# Patient Record
Sex: Female | Born: 1940 | Race: White | Hispanic: No | Marital: Married | Smoking: Former smoker
Health system: Southern US, Community
[De-identification: ages and names within clinical notes are randomized; demographics above are authoritative.]

## PROBLEM LIST (undated history)

## (undated) DIAGNOSIS — I509 Heart failure, unspecified: Secondary | ICD-10-CM

## (undated) DIAGNOSIS — R0600 Dyspnea, unspecified: Secondary | ICD-10-CM

## (undated) DIAGNOSIS — J189 Pneumonia, unspecified organism: Secondary | ICD-10-CM

## (undated) DIAGNOSIS — I5189 Other ill-defined heart diseases: Secondary | ICD-10-CM

## (undated) DIAGNOSIS — E039 Hypothyroidism, unspecified: Secondary | ICD-10-CM

## (undated) DIAGNOSIS — E785 Hyperlipidemia, unspecified: Secondary | ICD-10-CM

## (undated) DIAGNOSIS — F320 Major depressive disorder, single episode, mild: Secondary | ICD-10-CM

## (undated) DIAGNOSIS — Z87891 Personal history of nicotine dependence: Secondary | ICD-10-CM

## (undated) DIAGNOSIS — I10 Essential (primary) hypertension: Secondary | ICD-10-CM

## (undated) DIAGNOSIS — Z9289 Personal history of other medical treatment: Secondary | ICD-10-CM

## (undated) DIAGNOSIS — J449 Chronic obstructive pulmonary disease, unspecified: Secondary | ICD-10-CM

## (undated) DIAGNOSIS — F32A Depression, unspecified: Secondary | ICD-10-CM

## (undated) HISTORY — PX: OTHER SURGICAL HISTORY: SHX169

## (undated) HISTORY — DX: Hyperlipidemia, unspecified: E78.5

## (undated) HISTORY — PX: TONSILLECTOMY: SUR1361

## (undated) HISTORY — DX: Major depressive disorder, single episode, mild: F32.0

## (undated) HISTORY — DX: Depression, unspecified: F32.A

## (undated) HISTORY — DX: Personal history of nicotine dependence: Z87.891

## (undated) HISTORY — DX: Other ill-defined heart diseases: I51.89

## (undated) HISTORY — DX: Hypothyroidism, unspecified: E03.9

## (undated) HISTORY — DX: Personal history of other medical treatment: Z92.89

---

## 1975-09-08 HISTORY — PX: GALLBLADDER SURGERY: SHX652

## 1979-05-09 HISTORY — PX: TOTAL ABDOMINAL HYSTERECTOMY: SHX209

## 2002-07-06 ENCOUNTER — Ambulatory Visit (HOSPITAL_COMMUNITY): Admission: RE | Admit: 2002-07-06 | Discharge: 2002-07-06 | Payer: Self-pay | Admitting: General Surgery

## 2004-11-13 ENCOUNTER — Ambulatory Visit (HOSPITAL_COMMUNITY): Admission: RE | Admit: 2004-11-13 | Discharge: 2004-11-13 | Payer: Self-pay | Admitting: Family Medicine

## 2004-11-20 ENCOUNTER — Ambulatory Visit (HOSPITAL_COMMUNITY): Admission: RE | Admit: 2004-11-20 | Discharge: 2004-11-20 | Payer: Self-pay | Admitting: Family Medicine

## 2005-09-07 HISTORY — PX: ESOPHAGOGASTRODUODENOSCOPY: SHX1529

## 2005-09-07 HISTORY — PX: COLONOSCOPY: SHX174

## 2005-10-02 ENCOUNTER — Ambulatory Visit (HOSPITAL_COMMUNITY): Admission: RE | Admit: 2005-10-02 | Discharge: 2005-10-02 | Payer: Self-pay | Admitting: Family Medicine

## 2005-12-02 ENCOUNTER — Ambulatory Visit: Payer: Self-pay | Admitting: Internal Medicine

## 2006-01-13 ENCOUNTER — Ambulatory Visit (HOSPITAL_COMMUNITY): Admission: RE | Admit: 2006-01-13 | Discharge: 2006-01-13 | Payer: Self-pay | Admitting: Internal Medicine

## 2006-01-13 ENCOUNTER — Encounter (INDEPENDENT_AMBULATORY_CARE_PROVIDER_SITE_OTHER): Payer: Self-pay | Admitting: Specialist

## 2006-01-13 ENCOUNTER — Ambulatory Visit: Payer: Self-pay | Admitting: Internal Medicine

## 2006-03-15 ENCOUNTER — Ambulatory Visit: Payer: Self-pay | Admitting: Internal Medicine

## 2006-09-22 ENCOUNTER — Ambulatory Visit (HOSPITAL_COMMUNITY): Admission: RE | Admit: 2006-09-22 | Discharge: 2006-09-22 | Payer: Self-pay | Admitting: General Surgery

## 2006-09-28 ENCOUNTER — Ambulatory Visit (HOSPITAL_COMMUNITY): Admission: RE | Admit: 2006-09-28 | Discharge: 2006-09-28 | Payer: Self-pay | Admitting: Pulmonary Disease

## 2007-03-02 ENCOUNTER — Ambulatory Visit (HOSPITAL_COMMUNITY): Admission: RE | Admit: 2007-03-02 | Discharge: 2007-03-02 | Payer: Self-pay | Admitting: General Surgery

## 2007-09-07 ENCOUNTER — Inpatient Hospital Stay (HOSPITAL_COMMUNITY): Admission: EM | Admit: 2007-09-07 | Discharge: 2007-09-12 | Payer: Self-pay | Admitting: Emergency Medicine

## 2007-10-03 ENCOUNTER — Ambulatory Visit (HOSPITAL_COMMUNITY): Admission: RE | Admit: 2007-10-03 | Discharge: 2007-10-03 | Payer: Self-pay | Admitting: Family Medicine

## 2008-11-27 ENCOUNTER — Ambulatory Visit (HOSPITAL_COMMUNITY): Admission: RE | Admit: 2008-11-27 | Discharge: 2008-11-27 | Payer: Self-pay | Admitting: Family Medicine

## 2009-08-14 ENCOUNTER — Ambulatory Visit (HOSPITAL_COMMUNITY): Admission: RE | Admit: 2009-08-14 | Discharge: 2009-08-14 | Payer: Self-pay | Admitting: Internal Medicine

## 2009-08-19 ENCOUNTER — Ambulatory Visit (HOSPITAL_COMMUNITY): Admission: RE | Admit: 2009-08-19 | Discharge: 2009-08-19 | Payer: Self-pay | Admitting: Internal Medicine

## 2009-09-23 DIAGNOSIS — Z9289 Personal history of other medical treatment: Secondary | ICD-10-CM

## 2009-09-23 HISTORY — DX: Personal history of other medical treatment: Z92.89

## 2009-09-23 HISTORY — PX: TRANSTHORACIC ECHOCARDIOGRAM: SHX275

## 2009-12-09 ENCOUNTER — Ambulatory Visit (HOSPITAL_COMMUNITY): Admission: RE | Admit: 2009-12-09 | Discharge: 2009-12-09 | Payer: Self-pay | Admitting: Family Medicine

## 2010-06-16 ENCOUNTER — Ambulatory Visit (HOSPITAL_COMMUNITY): Admission: RE | Admit: 2010-06-16 | Discharge: 2010-06-16 | Payer: Self-pay | Admitting: Family Medicine

## 2010-06-26 ENCOUNTER — Ambulatory Visit (HOSPITAL_COMMUNITY): Admission: RE | Admit: 2010-06-26 | Discharge: 2010-06-26 | Payer: Self-pay | Admitting: Family Medicine

## 2010-07-14 ENCOUNTER — Ambulatory Visit (HOSPITAL_COMMUNITY): Admission: RE | Admit: 2010-07-14 | Discharge: 2010-07-14 | Payer: Self-pay | Admitting: Pulmonary Disease

## 2010-09-15 ENCOUNTER — Ambulatory Visit (HOSPITAL_COMMUNITY)
Admission: RE | Admit: 2010-09-15 | Discharge: 2010-09-15 | Payer: Self-pay | Source: Home / Self Care | Attending: Pulmonary Disease | Admitting: Pulmonary Disease

## 2010-09-22 LAB — BLOOD GAS, ARTERIAL
Acid-Base Excess: 1.3 mmol/L (ref 0.0–2.0)
Bicarbonate: 25.3 mEq/L — ABNORMAL HIGH (ref 20.0–24.0)
FIO2: 21 %
O2 Content: 21 L/min
O2 Saturation: 94.1 %
TCO2: 22.3 mmol/L (ref 0–100)
pCO2 arterial: 39.7 mmHg (ref 35.0–45.0)
pH, Arterial: 7.421 — ABNORMAL HIGH (ref 7.350–7.400)
pO2, Arterial: 65.4 mmHg — ABNORMAL LOW (ref 80.0–100.0)

## 2010-10-31 ENCOUNTER — Inpatient Hospital Stay (HOSPITAL_COMMUNITY)
Admission: EM | Admit: 2010-10-31 | Discharge: 2010-11-02 | DRG: 193 | Disposition: A | Discharge status: Home Health | Payer: Medicare Other | Attending: Family Medicine | Admitting: Family Medicine

## 2010-10-31 ENCOUNTER — Emergency Department (HOSPITAL_COMMUNITY): Payer: Medicare Other

## 2010-10-31 DIAGNOSIS — J441 Chronic obstructive pulmonary disease with (acute) exacerbation: Secondary | ICD-10-CM | POA: Diagnosis present

## 2010-10-31 DIAGNOSIS — J189 Pneumonia, unspecified organism: Principal | ICD-10-CM | POA: Diagnosis present

## 2010-10-31 DIAGNOSIS — E039 Hypothyroidism, unspecified: Secondary | ICD-10-CM | POA: Diagnosis present

## 2010-10-31 DIAGNOSIS — I10 Essential (primary) hypertension: Secondary | ICD-10-CM | POA: Diagnosis present

## 2010-10-31 DIAGNOSIS — J9620 Acute and chronic respiratory failure, unspecified whether with hypoxia or hypercapnia: Secondary | ICD-10-CM | POA: Diagnosis present

## 2010-10-31 LAB — BASIC METABOLIC PANEL
BUN: 20 mg/dL (ref 6–23)
Creatinine, Ser: 1.41 mg/dL — ABNORMAL HIGH (ref 0.4–1.2)
GFR calc non Af Amer: 37 mL/min — ABNORMAL LOW (ref >60)

## 2010-10-31 LAB — DIFFERENTIAL
Basophils Absolute: 0 10*3/uL (ref 0.0–0.1)
Basophils Relative: 0 % (ref 0–1)
Eosinophils Absolute: 0.1 10*3/uL (ref 0.0–0.7)
Eosinophils Relative: 1 % (ref 0–5)
Lymphocytes Relative: 6 % — ABNORMAL LOW (ref 12–46)
Lymphs Abs: 0.9 10*3/uL (ref 0.7–4.0)
Monocytes Absolute: 1.4 10*3/uL — ABNORMAL HIGH (ref 0.1–1.0)
Monocytes Relative: 10 % (ref 3–12)
Neutro Abs: 12.3 10*3/uL — ABNORMAL HIGH (ref 1.7–7.7)
Neutrophils Relative %: 84 % — ABNORMAL HIGH (ref 43–77)

## 2010-10-31 LAB — CBC
HCT: 39.1 % (ref 36.0–46.0)
Hemoglobin: 12.8 g/dL (ref 12.0–15.0)
MCH: 28 pg (ref 26.0–34.0)
MCHC: 32.7 g/dL (ref 30.0–36.0)
MCV: 85.6 fL (ref 78.0–100.0)
Platelets: 458 10*3/uL — ABNORMAL HIGH (ref 150–400)
RBC: 4.57 MIL/uL (ref 3.87–5.11)
RDW: 13.5 % (ref 11.5–15.5)
WBC: 14.7 10*3/uL — ABNORMAL HIGH (ref 4.0–10.5)

## 2010-10-31 LAB — BLOOD GAS, ARTERIAL
O2 Content: 2 L/min
O2 Saturation: 83 %

## 2010-11-01 LAB — TSH: TSH: 0.442 u[IU]/mL (ref 0.350–4.500)

## 2010-11-01 LAB — DIFFERENTIAL
Basophils Absolute: 0 10*3/uL (ref 0.0–0.1)
Lymphocytes Relative: 8 % — ABNORMAL LOW (ref 12–46)
Monocytes Absolute: 0.2 10*3/uL (ref 0.1–1.0)
Monocytes Relative: 2 % — ABNORMAL LOW (ref 3–12)
Neutro Abs: 9.5 10*3/uL — ABNORMAL HIGH (ref 1.7–7.7)

## 2010-11-01 LAB — COMPREHENSIVE METABOLIC PANEL
ALT: 15 U/L (ref 0–35)
Alkaline Phosphatase: 84 U/L (ref 39–117)
CO2: 24 mEq/L (ref 19–32)
Calcium: 8.9 mg/dL (ref 8.4–10.5)
Chloride: 106 mEq/L (ref 96–112)
GFR calc non Af Amer: 49 mL/min — ABNORMAL LOW (ref >60)
Glucose, Bld: 216 mg/dL — ABNORMAL HIGH (ref 70–99)
Sodium: 140 mEq/L (ref 135–145)
Total Bilirubin: 0.3 mg/dL (ref 0.3–1.2)

## 2010-11-01 LAB — CBC
HCT: 37.7 % (ref 36.0–46.0)
Hemoglobin: 12.2 g/dL (ref 12.0–15.0)
MCHC: 32.4 g/dL (ref 30.0–36.0)
RBC: 4.4 MIL/uL (ref 3.87–5.11)

## 2010-11-01 LAB — STREP PNEUMONIAE URINARY ANTIGEN: Strep Pneumo Urinary Antigen: NEGATIVE

## 2010-11-02 LAB — LEGIONELLA ANTIGEN, URINE: Legionella Antigen, Urine: NEGATIVE

## 2010-11-03 NOTE — Discharge Summary (Signed)
  NAMENOEMIE, DEVIVO                 ACCOUNT NO.:  [id number]  MEDICAL RECORD NO.:  [id number]           PATIENT TYPE:  I  LOCATION:  A332                          FACILITY:  [facility]  PHYSICIAN:  Tarry Kos, MD       DATE OF BIRTH:  1941-02-02  DATE OF ADMISSION:  10/31/2010 DATE OF DISCHARGE:  02/26/2012LH                              DISCHARGE SUMMARY   DISCHARGE DIAGNOSES: 1. Community-acquired pneumonia. 2. Chronic obstructive pulmonary disease exacerbation. 3. Acute on chronic respiratory failure.  SUMMARY OF HOSPITAL COURSE:  Ms. Marny is a pleasant 70 year old female who presented to the emergency room on October 31, 2010, with cough and acute respiratory distress with wheezing.  She was found to have pneumonia.  She was placed on IV steroids, and IV Rocephin and azithromycin.  She did well.  She chronically uses 2 liters nasal cannula oxygen at night and as needed during the day.  Respiratory status improved with steroids and with frequent bronchodilators.  By the time of discharge, she was back to baseline status requiring 2 liters nasal cannula with O2 sats above 92%.  She is being discharged home today to follow up with primary care physician in 1 week as she has been afebrile.  PHYSICAL EXAMINATION:  VITAL SIGNS:  Stable. GENERAL:  Alert and oriented x4.  No apparent stress.  Cooperative and friendly. COR:  Regular rate rhythm without murmurs, rubs, or gallops. CHEST:  Clear to auscultation bilaterally.  No wheeze, rhonchi, or rales. ABDOMEN:  Soft, nontender, and nondistended.  Positive bowel sounds.  No hepatosplenomegaly. EXTREMITIES:  No clubbing, cyanosis, or edema. PSYCHIATRIC:  Normal affect. NEUROLOGICAL:  No focal neurologic deficits. SKIN:  No rashes.  The patient is being discharged home again to follow up with primary care physician in 1 week.  I have called her in a prescription for Vantin 200 mg twice a day for a week to complete a course of  antibiotics for her pneumonia and prednisone taper 20 mg twice a day for 3 days at 20 mg a day for 2 days and 10 mg a day for 3 days and off.  She is to continue her home medications otherwise.  Again follow up with PCP in 1 week and she also follows Pulmonology on an outpatient basis.  She is to continue her appointments as previously arranged.                                           ______________________________ Tarry Kos, MD     RD/MEDQ  D:  11/02/2010  T:  11/03/2010  Job:  [id number]  Electronically Signed by Eldridge Dace MD on 11/03/2010 11:56:05 AM

## 2010-11-03 NOTE — H&P (Signed)
NAMEMALLEY, HAUTER                 ACCOUNT NO.:  [id number]  MEDICAL RECORD NO.:  [id number]           PATIENT TYPE:  I  LOCATION:  A332                          FACILITY:  [facility]  PHYSICIAN:  Wilson Singer, M.D.DATE OF BIRTH:  [birth year]  DATE OF ADMISSION:  10/31/2010 DATE OF DISCHARGE:  LH                             HISTORY & PHYSICAL   CHIEF COMPLAINT:  Dyspnea and cough productive of yellow-green sputum.  HISTORY OF PRESENTING ILLNESS:  This very pleasant 70 year old lady who is known to have COPD, comes in with a 4-day history of increasing cough productive of green-yellow sputum.  She says that her dyspnea is not particularly different although she appears to be clearly dyspneic at rest.  She normally has oxygen available to her at home but I do not believe she uses it on a 24-hour basis.  She was last hospitalized in [facility] more than a year ago.  She denies any hemoptysis.  There is no fever.  PAST MEDICAL HISTORY:  COPD as mentioned above, hypertension, the name other drug she is unclear on, hypothyroidism.  MEDICATIONS: 1. Advair Diskus 1 twice a day. 2. Spiriva inhaler 1 daily. 3. Levothyroxine 25 mcg daily. 4. Wellbutrin 150 mg daily which she started to quit smoking now and     she has quit for more than a year. 5. Xanax 0.25 mg as needed daily.  ALLERGIES:  None.  SOCIAL HISTORY:  The patient is married for over 40 years.  She quit smoking 1 year ago and prior to that, had smoking for over 50 years. She does not drink alcohol.  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  PAST SURGICAL HISTORY:  Cholecystectomy, hysterectomy, right carpal tunnel surgery.  REVIEW OF SYSTEMS:  Apart from the symptoms mentioned above, there are no other symptoms referable to all systems reviewed.  PHYSICAL EXAMINATION:  VITAL SIGNS:  Temperature 97.9, blood pressure 123/57, pulse 80, respiratory rate 14-16, saturation 98% on 2 liters of oxygen.  She is  not peripheral or centrally cyanosed.  She is a "pink puffer."  There is no clubbing. CARDIOVASCULAR:  Heart sounds are present and normal without murmurs. Jugular venous pressure is not raised.  She does not appear to be in clinical heart failure. RESPIRATORY:  She had reduced air entry on both lung fields, more on the left than the right.  She has crackles in the left base and some scattered wheezing throughout.  This is mild. ABDOMEN:  Soft and nontender with no hepatosplenomegaly. NEUROLOGIC:  She is alert and oriented without any focal neurologic signs. SKIN:  There are no obvious skin rashes or lesions. MUSCULOSKELETAL:  There are some osteoarthritic changes in the hands but no other major abnormalities.  INVESTIGATIONS:  Chest x-ray shows bibasilar opacities with small effusion suspicious for pneumonia.  There is also COPD.  Hemoglobin 12.8, white blood cell count 14.7, platelets 458.  Arterial blood gas on 2 liters oxygen showed pH of 7.4, PCO2 of 40.7, PO2 of 46.8, saturation 83%.  Sodium 138, potassium 3.5, bicarbonate 23, glucose 127, BUN 20, creatinine  1.41.  PROBLEM LIST: 1. Bilateral pneumonia, left greater than right. 2. Chronic obstructive pulmonary disease. 3. Hypertension. 4. Hypothyroidism.  PLAN: 1. Admit. 2. Intravenous steroids and intravenous antibiotics. 3. Culturing urine for Legionella and strep pneumonia antigens.  Further recommendations will depend on the patient's hospital progress.     Wilson Singer, M.D.     NCG/MEDQ  D:  10/31/2010  T:  11/01/2010  Job:  [id number]  cc:   Kirk Ruths, M.D. Fax: [phone]  Electronically Signed by Lilly Cove M.D. on 11/03/2010 11:53:14 AM

## 2010-11-05 LAB — CULTURE, BLOOD (ROUTINE X 2)
Culture: NO GROWTH
Culture: NO GROWTH

## 2010-11-25 ENCOUNTER — Other Ambulatory Visit (HOSPITAL_COMMUNITY): Payer: Self-pay | Admitting: Pediatrics

## 2010-11-25 DIAGNOSIS — Z139 Encounter for screening, unspecified: Secondary | ICD-10-CM

## 2010-12-12 ENCOUNTER — Encounter (HOSPITAL_COMMUNITY): Payer: Self-pay

## 2010-12-12 ENCOUNTER — Ambulatory Visit (HOSPITAL_COMMUNITY)
Admission: RE | Admit: 2010-12-12 | Discharge: 2010-12-12 | Disposition: A | Discharge status: Home / Self Care | Payer: Medicare Other | Source: Ambulatory Visit | Attending: Pulmonary Disease | Admitting: Pulmonary Disease

## 2010-12-12 ENCOUNTER — Ambulatory Visit (HOSPITAL_COMMUNITY)
Admission: RE | Admit: 2010-12-12 | Discharge: 2010-12-12 | Disposition: A | Discharge status: Home / Self Care | Payer: Medicare Other | Source: Ambulatory Visit | Attending: Pediatrics | Admitting: Pediatrics

## 2010-12-12 ENCOUNTER — Other Ambulatory Visit (HOSPITAL_COMMUNITY): Payer: Self-pay | Admitting: Pulmonary Disease

## 2010-12-12 DIAGNOSIS — J4489 Other specified chronic obstructive pulmonary disease: Secondary | ICD-10-CM | POA: Insufficient documentation

## 2010-12-12 DIAGNOSIS — J449 Chronic obstructive pulmonary disease, unspecified: Secondary | ICD-10-CM | POA: Insufficient documentation

## 2010-12-12 DIAGNOSIS — R0609 Other forms of dyspnea: Secondary | ICD-10-CM | POA: Insufficient documentation

## 2010-12-12 DIAGNOSIS — R0989 Other specified symptoms and signs involving the circulatory and respiratory systems: Secondary | ICD-10-CM | POA: Insufficient documentation

## 2010-12-12 DIAGNOSIS — Z139 Encounter for screening, unspecified: Secondary | ICD-10-CM

## 2010-12-12 DIAGNOSIS — Z1231 Encounter for screening mammogram for malignant neoplasm of breast: Secondary | ICD-10-CM | POA: Insufficient documentation

## 2010-12-12 DIAGNOSIS — Z8701 Personal history of pneumonia (recurrent): Secondary | ICD-10-CM | POA: Insufficient documentation

## 2010-12-12 HISTORY — DX: Essential (primary) hypertension: I10

## 2010-12-12 HISTORY — DX: Chronic obstructive pulmonary disease, unspecified: J44.9

## 2011-01-14 ENCOUNTER — Other Ambulatory Visit (HOSPITAL_COMMUNITY): Payer: Self-pay | Admitting: Pediatrics

## 2011-01-14 ENCOUNTER — Ambulatory Visit (HOSPITAL_COMMUNITY)
Admission: RE | Admit: 2011-01-14 | Discharge: 2011-01-14 | Disposition: A | Discharge status: Home / Self Care | Payer: Medicare Other | Source: Ambulatory Visit | Attending: Pediatrics | Admitting: Pediatrics

## 2011-01-14 DIAGNOSIS — S91009A Unspecified open wound, unspecified ankle, initial encounter: Secondary | ICD-10-CM | POA: Insufficient documentation

## 2011-01-14 DIAGNOSIS — X58XXXA Exposure to other specified factors, initial encounter: Secondary | ICD-10-CM | POA: Insufficient documentation

## 2011-01-14 DIAGNOSIS — M869 Osteomyelitis, unspecified: Secondary | ICD-10-CM

## 2011-01-14 DIAGNOSIS — S81009A Unspecified open wound, unspecified knee, initial encounter: Secondary | ICD-10-CM | POA: Insufficient documentation

## 2011-01-14 DIAGNOSIS — M899 Disorder of bone, unspecified: Secondary | ICD-10-CM | POA: Insufficient documentation

## 2011-01-14 DIAGNOSIS — R531 Weakness: Secondary | ICD-10-CM

## 2011-01-14 DIAGNOSIS — M949 Disorder of cartilage, unspecified: Secondary | ICD-10-CM | POA: Insufficient documentation

## 2011-01-20 NOTE — Discharge Summary (Signed)
[name], [name]                 ACCOUNT NO.:  [id number]   MEDICAL RECORD NO.:  [id number]          PATIENT TYPE:  INP   LOCATION:  A332                          FACILITY:  [facility]   PHYSICIAN:  Mobolaji B. Bakare, M.D.DATE OF BIRTH:  1941/06/06   DATE OF ADMISSION:  09/06/2007  DATE OF DISCHARGE:  01/05/2009LH                               DISCHARGE SUMMARY   PRIMARY CARE PHYSICIAN:  Kirk Ruths, M.D.   DISCHARGE DIAGNOSES:  1. Left lower lobe pneumonia with associated small left pleural      effusion.  2. Mild chronic obstructive pulmonary disease exacerbation.  3. Ongoing tobacco abuse.  4. Leukocytosis.  5. Hyperglycemia secondary to steroids.  6. Nausea and vomiting, resolved.   SECONDARY DIAGNOSIS:  Hypertension.   PROCEDURE:  1. CT scan abdomen and pelvis on September 07, 2007, showed mild      biliary ductal dilatation likely secondary to post cholecystectomy      state.  Area of consolidation in the posterior left lower lobe with      trace left effusion concerning for pneumonia.  Pelvic CT showed no      acute findings.  2. Chest x-ray on September 07, 2007, showed cannot exclude subtle left      lower lobe air space disease.  3. Chest x-ray on September 09, 2007, showed left lower lobe air space      disease with small left effusion likely pneumonia with      parapneumonic effusion.  Recommend follow-up to resolution to      exclude other processes.  COPD and emphysema.   BRIEF HISTORY:  Please refer to admission H&P dictated by Dorris Singh, DO.  Briefly, the patient is a pleasant 70 year old Caucasian  female with history of COPD.  She presented to the emergency room with  left flank pain, more in the left upper quadrant for about 24 hours.  She denies any fever, chills, or shortness of breath.  The patient  smokes cigarettes and she has a known history of COPD.  A CT of abdomen  was done to evaluate the left upper quadrant pain.  There was no acute  abdominal findings, however, there was a high suspicion for left lower  lobe pneumonia on CT.  A chest x-ray also showed left lower lobe air  space disease with small pleural effusion.  The patient had a  temperature of 98 degrees Fahrenheit on admission with O2 saturations of  97% on 2 liters.  Initial laboratory data showed an elevated white blood  cell count of 12,000.  She was admitted for IV antibiotics and  treatment.   HOSPITAL COURSE:  Problem 1.  Left lower lobe pneumonia.  The patient  was started on IV Rocephin and Zithromax.  This was felt to be community  acquired pneumonia.  She remained afebrile during the course of  hospitalization.  After three days of IV antibiotics, it was changed to  p.o. Avelox.  She will complete 10 days of antibiotic treatment.   The patient developed nausea and had two episodes of vomiting during the  course of hospitalization.  We obtained an abdominal x-ray and there was  no ileus or small bowel obstruction.  There was no associated abdominal  pain with these episodes and these have since resolved.   The patient was noted on CT scan and chest x-ray to have small left  pleural effusion.  She was improving on IV antibiotics and it was not  felt necessary to do a thoracentesis.  A follow-up chest x-ray showed  stability of the small left pleural effusion.  She will need a follow-up  chest x-ray in two weeks to ensure resolution of this effusion which is  felt to be parapneumonic.   Problem 2.  Hypertension.  This was fairly controlled during the course  of hospitalization with home medications.   Problem 3.  COPD.  The patient had mild exacerbation on admission and  she was started on IV steroids.  These have been transitioned to p.o.  prednisone.  She will go home on prednisone taper and continue Advair,  Spiriva, and albuterol inhalers as before.   CONDITION ON DISCHARGE:  Stable.  The patient was afebrile and vitals at  the time of  discharge were temperature 97.1, pulse 90, respiratory rate  20, O2 saturations 94% on room air.   DISCHARGE MEDICATIONS:  1. Avelox 400 mg daily for five more days.  2. Nicotine 7 mg daily.  3. Prednisone 30 mg to taper by 10 mg every three days.  4. Albuterol inhaler two puffs q.2 hours p.r.n.  5. Advair Diskus 250/50 b.i.d.  6. Spiriva 80 mcg daily.  7. Wellbutrin 150 mg b.i.d.  8. Lisinopril hydrochlorothiazide 20/12.5 one daily.  9. Advair to use as before.  10.Multivitamin.  11.Calcium 1000 mg daily.  12.Ambien p.r.n.   FOLLOWUP:  Follow up with Dr. Regino Schultze in one week.   RECOMMENDATIONS:  Repeat a chest x-ray in 2-3 weeks to ensure resolution  of pleural effusion.   DISCHARGE LABORATORY DATA:  Hemoglobin A1C 5.7, sodium 137, potassium  3.5, chloride 103, CO2 27, glucose 140, BUN 17, creatinine 0.6, calcium  8.6.  Sputum culture; no growth. White blood cell count 13.8, hemoglobin  12.3, hematocrit 37.2, platelets 338.      Mobolaji B. Corky Downs, M.D.  Electronically Signed     MBB/MEDQ  D:  09/11/2007  T:  09/11/2007  Job:  [id number]   cc:   Kirk Ruths, M.D.  Fax: [phone]

## 2011-01-20 NOTE — H&P (Signed)
[name], [name]                 ACCOUNT NO.:  [id number]   MEDICAL RECORD NO.:  [id number]          PATIENT TYPE:  INP   LOCATION:  A332                          FACILITY:  [facility]   PHYSICIAN:  Dorris Singh, DO    DATE OF BIRTH:  [birth year]   DATE OF ADMISSION:  09/06/2007  DATE OF DISCHARGE:  LH                              HISTORY & PHYSICAL   HISTORY OF PRESENT ILLNESS:  The patient is a 70 year old female who  presented to the emergency room complaining of left flank pain.  She  stated that 24 hours ago she was pain free.  Had noticed that it  occurred very acutely.  For the Christmas holidays, she has been around  her grandchildren and great grandchildren who all have congestion and  runny noses.  She denies being around anybody else who has been sick.  She said that she did not have any fevers or chills but did admit to  having some shortness of breath.   PAST MEDICAL HISTORY:  Significant for COPD and emphysema.   PAST SURGICAL HISTORY:  1. Appendectomy.  2. Carpal tunnel release.  3. Cholecystectomy.  4. Hysterectomy.   SOCIAL HISTORY:  No drug abuse.  She currently smokes at least a pack a  day, and she claims to be a social drinker.   ALLERGIES:  No known drug allergies.   CURRENT MEDICATIONS:  1. Lisinopril 12.5 mg p.o. daily.  2. Bupropion 150 mg p.o. daily.  3. Advair discus 250/5 puffs twice a day.  4. Multivitamin one tablet daily.  5. Calcium 1000 mg daily.   REVIEW OF SYSTEMS:  Positive for left flank pain, shortness of breath  and everything else was negative except a 10-point system review is  negative except for pertinent positive in HPI.   PHYSICAL EXAMINATION:  VITAL SIGNS:  Blood pressure 106/55, pulse rate  98, respirations 24, temperature 99.0, saturating 97% on 2 liters.  GENERAL:  This is a 70 year old Caucasian female who is well-developed,  well-nourished, currently in no acute distress.  HEENT:  Normocephalic, atraumatic.  Eyes:  EOMI.   PERRLA.  Ears:  TMs  visualized bilaterally.  Mouth:  Teeth are in fair repair.  Mucosa is  hydrated and there is no erythema or exudate noted.  NECK:  Supple, full range of motion.  HEART:  Regular rate and rhythm.  RESPIRATIONS:  Clear to auscultations on the right side.  However, there  are rales in the lower lobe on the left side.  ABDOMEN:  Soft, nontender, nondistended.  No organomegaly noted.  Bowel  sounds present in all four quadrants.  EXTREMITIES:  Positive pulses.  No ecchymosis, edema or cyanosis.   STUDIES:  CT of the pelvis which showed mild biliary dilation which may  be related to post cholecystectomy, stable benign appearing multiple  hepatic cysts area of consolidation in the left lower lobe with trace  left effusion concerning for pneumonia in CT of the chest  and CT of the  pelvis.  No free fluid air or lymphadenopathy.  Pelvic and small bowel  grossly unremarkable.  Ureter and bladder unremarkable.  No acute  findings in the pelvis.   Lipase 16, sodium 136, potassium 3.6, chloride 103, CO2 27, glucose 120,  BUN 19, creatinine 0.81, calcium 9.1.  White count 12.1, hemoglobin  12.7, hematocrit 37.0, platelets 306.  Urine was normal.  Specific  gravity of 1.030.   ASSESSMENT/PLAN:  1. Pneumonia.  2. Left lower lobe pneumonia, possibly community acquired.  3. History of chronic obstructive pulmonary disease.   PLAN:  Admit the patient to the service of [facility].  Will place her on  antibiotics, Rocephin as well as Zithromax.  Will also do a pulmonary  toilet, breathing treatments and nebulizer treatments.  Will give her  breathing treatments around the clock.  Will place her on her home  medication and will do DVT and GI prophylaxis.  Will continue to monitor  her.  Anticipate discharge in 3-5 days.  Will also have Home Health come  and talk with the patient regarding continuing breathing treatments at  home if she is discharged.      Dorris Singh, DO   Electronically Signed     CB/MEDQ  D:  09/07/2007  T:  09/07/2007  Job:  [id number]

## 2011-01-23 NOTE — Op Note (Signed)
[name], [name]                 ACCOUNT NO.:  [id number]   MEDICAL RECORD NO.:  [id number]          PATIENT TYPE:  AMB   LOCATION:  DAY                           FACILITY:  [facility]   PHYSICIAN:  R. Roetta Sessions, M.D. DATE OF BIRTH:  16-Sep-1940   DATE OF PROCEDURE:  DATE OF DISCHARGE:                                 OPERATIVE REPORT   PROCEDURE PERFORMED:  Esophagogastroduodenoscopy with small bowel biopsy  followed colonoscopy with biopsy.   INDICATIONS FOR PROCEDURE:  The patient is a 70 year old lady with five  month history of postprandial diarrhea and weight loss.  CT scan from  [facility] suggests a pancreatic head mass.  I reviewed these films  personally on the CD-ROM with Dr. Alver Fisher.  It was felt that there was no mass  present, possibly a prominent ampulla but variant of normal.  The films were  not impressive.  The patient states that weight loss has stabilized.  Thyroid functions were found to be normal.  EGD and colonoscopy are now  being done.  This approach has been discussed with the patient at length,  potential risks, benefits and alternatives have been reviewed and questions  answered.  The patient is agreeable.  Please see documentation in the  medical record.   Oxygen saturations, blood pressure, pulse and respirations were monitored  throughout the entirety of both procedures.   CONSCIOUS SEDATION:  Versed 3 mg IV, Demerol 50 mg IV in divided doses.  Cetacaine spray for topical pharyngeal anesthesia.   INSTRUMENT USED:  Olympus video chip system.   FINDINGS:  Examination of the tubular esophagus revealed a noncritical  Schatzki's ring, otherwise esophageal mucosa appeared normal.  Esophagogastric junction easily traversed.   Stomach:  The gastric cavity was emptied and insufflated well with air.  Thorough examination of the gastric mucosa including retroflex view of the  proximal stomach, esophagogastric junction demonstrated only a small hiatal  hernia.  Pylorus was patent and easily traversed.  Examination of the bulb,  second and third portion revealed no abnormalities.   THERAPY/DIAGNOSTIC MANEUVERS PERFORMED:  Biopsies of the second and third  portion of the duodenum were taken for histologic study to rule out villous  atrophy.  The patient tolerated the procedure and was prepared for  colonoscopy.   Digital rectal examination revealed no abnormalities.   ENDOSCOPIC FINDINGS:  Prep was adequate.  Rectum:  Examination of rectal mucosa including a retroflex view of the anal  verge revealed a couple of anal papilla and internal hemorrhoids, otherwise  negative.  Colon:  Colonic mucosa was surveyed from the rectosigmoid junction to the  left, transverse and right colon to the area of the appendiceal orifice and  the ileocecal valve and cecum.  These structures were well seen and  photographed for the record.  I attempted to intubate the terminal ileum and  was unable to do so.  From this level, the scope was slowly withdrawn. All  previously mentioned mucosal surfaces were again seen.  The patient had  three diminutive polyps in the middescending colon which were cold  biopsied/removed.  Separately, biopsies of the descending mucosa and the  rectal mucosa were taken to rule out microscopic colitis.  The patient  tolerated both procedures well, was reacted in endoscopy.   IMPRESSION:  1.  Normal esophagus, noncritical Schatzki's ring, not manipulated.  2.  Small hiatal hernia, otherwise normal stomach, normal  D1 through D3,      biopsies of D2 and D3 taken.   Colonoscopy findings:  Anal papilla and internal hemorrhoids, otherwise  normal rectum.  Diminutive polyps in the middescending colon, cold biopsied  removed.  Segmental biopsies of the colon and rectum taken to rule out  microscopic colitis.  Remainder of colonic mucosa appeared normal.   RECOMMENDATIONS:  1.  Follow up on pathology.  2.  Further recommendations to  follow.      Jonathon Bellows, M.D.  Electronically Signed     RMR/MEDQ  D:  01/13/2006  T:  01/14/2006  Job:  [id number]   cc:   Kirk Ruths, M.D.  Fax: [phone]

## 2011-01-23 NOTE — H&P (Signed)
   NAMEKANDISS, [name] NO.:  [id number]   MEDICAL RECORD NO.:  [id number]                  PATIENT TYPE:   LOCATION:                                       FACILITY:  [facility]   PHYSICIAN:  Dalia Heading, M.D.               DATE OF BIRTH:  01-11-1941   DATE OF ADMISSION:  07/06/2002  DATE OF DISCHARGE:                                HISTORY & PHYSICAL   CHIEF COMPLAINT:  Left lower quadrant abdominal pain.   HISTORY OF PRESENT ILLNESS:  The patient is a 70 year old white female who  is referred for a colonoscopy.  She has been having intermittent left lower  quadrant abdominal pain.  She denies any weight loss, fever, abdominal pain,  diarrhea, constipation, hematochezia, or melena.  Her bowel movements have  been regular.  She has never had a colonoscopy.  She denies any hemorrhoidal  problems or immediate family history of colon carcinoma.   PAST MEDICAL HISTORY:  Extrinsic allergies.   PAST SURGICAL HISTORY:  1. Cholecystectomy.  2. Hysterectomy.   CURRENT MEDICATIONS:  1. Premarin.  2. Wellbutrin.  3. Allegra.  4. A blood pressure pill.   ALLERGIES:  Codeine and aspirin.   REVIEW OF SYSTEMS:  Unremarkable.   PHYSICAL EXAMINATION:  GENERAL:  The patient is a well-developed, well-nourished white female in no  acute distress.  VITAL SIGNS:  She is afebrile.  Vital signs are stable.  LUNGS:  Clear to auscultation with equal breath sounds bilaterally.  HEART:  Reveals a regular rate and rhythm without S3, S4, murmurs.  ABDOMEN:  Soft, nontender, nondistended.  No hepatosplenomegaly or masses  are noted.  RECTAL:  Deferred  to the procedure.   IMPRESSION:  Left lower quadrant abdominal pain of unknown etiology.    PLAN:  The patient is scheduled for a colonoscopy on 07/06/02.  The risks  and benefits of the procedure including bleeding and perforation were fully  explained to the patient, gave informed consent.                                     Dalia Heading, M.D.    MAJ/MEDQ  D:  06/29/2002  T:  06/29/2002  Job:  [id number]   cc:   Wyvonnia Lora  [address]  [place]  [place] [address]  Fax: [phone]

## 2011-01-23 NOTE — Procedures (Signed)
NAMEJULLIE, ARPS                 ACCOUNT NO.:  [id number]   MEDICAL RECORD NO.:  [id number]          PATIENT TYPE:  OUT   LOCATION:  RAD                           FACILITY:  [facility]   PHYSICIAN:  Edward L. Juanetta Gosling, M.D.DATE OF BIRTH:  1941-01-12   DATE OF PROCEDURE:  09/28/2006  DATE OF DISCHARGE:                            PULMONARY FUNCTION TEST   1. Spirometry shows a severe ventilatory defect with evidence of      airflow obstruction.  2. Lung volumes show no evidence of restrictive change but do show air      trapping.  3. DLCO is moderately reduced.  4. Arterial blood gases are normal.  5. There is no significant bronchodilator improvement.      Edward L. Juanetta Gosling, M.D.  Electronically Signed     ELH/MEDQ  D:  09/29/2006  T:  09/29/2006  Job:  [id number]   cc:   [facility]

## 2011-01-29 ENCOUNTER — Encounter (HOSPITAL_BASED_OUTPATIENT_CLINIC_OR_DEPARTMENT_OTHER): Payer: Medicare Other | Attending: Internal Medicine

## 2011-01-29 DIAGNOSIS — J449 Chronic obstructive pulmonary disease, unspecified: Secondary | ICD-10-CM | POA: Insufficient documentation

## 2011-01-29 DIAGNOSIS — E039 Hypothyroidism, unspecified: Secondary | ICD-10-CM | POA: Insufficient documentation

## 2011-01-29 DIAGNOSIS — J4489 Other specified chronic obstructive pulmonary disease: Secondary | ICD-10-CM | POA: Insufficient documentation

## 2011-01-29 DIAGNOSIS — S91009A Unspecified open wound, unspecified ankle, initial encounter: Secondary | ICD-10-CM | POA: Insufficient documentation

## 2011-01-29 DIAGNOSIS — S81009A Unspecified open wound, unspecified knee, initial encounter: Secondary | ICD-10-CM | POA: Insufficient documentation

## 2011-01-29 DIAGNOSIS — W2203XA Walked into furniture, initial encounter: Secondary | ICD-10-CM | POA: Insufficient documentation

## 2011-01-29 DIAGNOSIS — Z79899 Other long term (current) drug therapy: Secondary | ICD-10-CM | POA: Insufficient documentation

## 2011-01-30 NOTE — Assessment & Plan Note (Signed)
[facility]  NAME:  [name], [name] NO.:  [id number]  MEDICAL RECORD NO.:  [id number]      DATE OF BIRTH:  1941-07-30  PHYSICIAN:  Maxwell Caul, M.D.      VISIT DATE:                                  OFFICE VISIT   HISTORY:  [name] is a 70 year old woman who was vacationing, ran into the side of a bed in a hotel room.  This wound apparently bled for 2 hours afterwards.  She treated this with topical peroxide and Neosporin and a dry dressing roughly 2-3 weeks later the area it is still not healed.  The surrounding area became red and extremely painful and 2 weeks ago, she went to see her primary physician in [place].  She was prescribed topical Bactroban and Keflex.  An x-ray was negative for osteomyelitis.  I do not believe the cultures were actually done.  In any case, the erythema and the pain have ameliorated somewhat.  She is here for a nonhealing wound of roughly 5 weeks' duration.  PAST MEDICAL HISTORY:  COPD.  She takes oxygen 2 liters at night, hypothyroidism, bilateral cataracts, gallbladder surgery in 1977, and a hysterectomy in 1980.  Her medication list is reviewed.  She is on: 1. Advair Diskus 250 one puff b.i.d. 2. Albuterol nebulizers p.r.n. 3. Xanax 0.25 b.i.d. 4. Diltiazem CR 120 mg daily. 5. Omega-3 fish oil one daily. 6. Synthroid 25 daily. 7. Mucinex 600 one p.r.n. 8. Spiriva 18 mcg 1 puff daily. 9. Pravastatin 20 mg daily. 10.Bupropion 150 daily. 11.Lasix 20 daily.  PHYSICAL EXAMINATION:  VITAL SIGNS:  Temperature is 98.4, pulse 108, respirations 18, blood pressure 118/72. RESPIRATORY:  Revealed decreased air entry bilaterally, however, there was no wheezing.  Her work of breathing was normal.  There is no accessory muscle use. CARDIAC:  Heart sounds are normal.  There is no murmurs.  No increase in jugular venous pressure. EXTREMITIES:  Her peripheral pulses were intact.  Capillary refill time was  felt to be normal.  WOUND EXAM:  The area in question was on the right anterior lower leg measuring 2.3 x 1.6 x 0.1.  The area was properly anesthetized with topical lidocaine.  We debrided this, surface eschar, some necrotic subcutaneous tissue.  Fortunately after this, we came down to a very healthy-looking granulated base.  There was no evidence of infection.  I saw no reason to culture this.  IMPRESSION:  Traumatic wound to the right lower leg.  Any component of cellulitis here appears to have resolved.  I did not culture this.  We debrided this down to a clean base as noted above.  The topical treatment was Santyl with Medihoney, a Vaseline gauze covered by adherent dressing.  This does not need to be changed for a week and we will review it at that time.  It is possible that we will switch to a collagen-based dressing.          ______________________________ Maxwell Caul, M.D.     MGR/MEDQ  D:  01/29/2011  T:  01/30/2011  Job:  [id number]

## 2011-02-12 ENCOUNTER — Encounter (HOSPITAL_BASED_OUTPATIENT_CLINIC_OR_DEPARTMENT_OTHER): Payer: Medicare Other | Attending: Internal Medicine

## 2011-02-12 DIAGNOSIS — S81809A Unspecified open wound, unspecified lower leg, initial encounter: Secondary | ICD-10-CM | POA: Insufficient documentation

## 2011-02-12 DIAGNOSIS — W2203XA Walked into furniture, initial encounter: Secondary | ICD-10-CM | POA: Insufficient documentation

## 2011-02-12 DIAGNOSIS — J449 Chronic obstructive pulmonary disease, unspecified: Secondary | ICD-10-CM | POA: Insufficient documentation

## 2011-02-12 DIAGNOSIS — Z79899 Other long term (current) drug therapy: Secondary | ICD-10-CM | POA: Insufficient documentation

## 2011-02-12 DIAGNOSIS — E039 Hypothyroidism, unspecified: Secondary | ICD-10-CM | POA: Insufficient documentation

## 2011-02-12 DIAGNOSIS — S81009A Unspecified open wound, unspecified knee, initial encounter: Secondary | ICD-10-CM | POA: Insufficient documentation

## 2011-02-12 DIAGNOSIS — J4489 Other specified chronic obstructive pulmonary disease: Secondary | ICD-10-CM | POA: Insufficient documentation

## 2011-03-12 ENCOUNTER — Encounter (HOSPITAL_COMMUNITY)
Admission: RE | Admit: 2011-03-12 | Discharge: 2011-03-12 | Disposition: A | Discharge status: Home / Self Care | Payer: Medicare Other | Source: Ambulatory Visit | Attending: Pulmonary Disease | Admitting: Pulmonary Disease

## 2011-03-12 DIAGNOSIS — J449 Chronic obstructive pulmonary disease, unspecified: Secondary | ICD-10-CM | POA: Insufficient documentation

## 2011-03-12 DIAGNOSIS — Z5189 Encounter for other specified aftercare: Secondary | ICD-10-CM | POA: Insufficient documentation

## 2011-03-12 DIAGNOSIS — J4489 Other specified chronic obstructive pulmonary disease: Secondary | ICD-10-CM | POA: Insufficient documentation

## 2011-03-16 ENCOUNTER — Encounter (HOSPITAL_COMMUNITY)
Admission: RE | Admit: 2011-03-16 | Discharge: 2011-03-16 | Disposition: A | Discharge status: Home / Self Care | Payer: Medicare Other | Source: Ambulatory Visit | Attending: Pulmonary Disease | Admitting: Pulmonary Disease

## 2011-03-18 ENCOUNTER — Encounter (HOSPITAL_COMMUNITY)
Admission: RE | Admit: 2011-03-18 | Discharge: 2011-03-18 | Disposition: A | Discharge status: Home / Self Care | Payer: Medicare Other | Source: Ambulatory Visit | Attending: Pulmonary Disease | Admitting: Pulmonary Disease

## 2011-03-23 ENCOUNTER — Encounter (HOSPITAL_COMMUNITY)
Admission: RE | Admit: 2011-03-23 | Discharge: 2011-03-23 | Disposition: A | Discharge status: Home / Self Care | Payer: Medicare Other | Source: Ambulatory Visit | Attending: Pulmonary Disease | Admitting: Pulmonary Disease

## 2011-03-25 ENCOUNTER — Encounter (HOSPITAL_COMMUNITY): Payer: Medicare Other

## 2011-03-30 ENCOUNTER — Encounter (HOSPITAL_COMMUNITY)
Admission: RE | Admit: 2011-03-30 | Discharge: 2011-03-30 | Disposition: A | Discharge status: Home / Self Care | Payer: Medicare Other | Source: Ambulatory Visit | Attending: Pulmonary Disease | Admitting: Pulmonary Disease

## 2011-04-01 ENCOUNTER — Encounter (HOSPITAL_COMMUNITY)
Admission: RE | Admit: 2011-04-01 | Discharge: 2011-04-01 | Disposition: A | Discharge status: Home / Self Care | Payer: Medicare Other | Source: Ambulatory Visit | Attending: Pulmonary Disease | Admitting: Pulmonary Disease

## 2011-04-06 ENCOUNTER — Encounter (HOSPITAL_COMMUNITY): Payer: Medicare Other

## 2011-04-08 ENCOUNTER — Ambulatory Visit (HOSPITAL_COMMUNITY)
Admission: RE | Admit: 2011-04-08 | Discharge: 2011-04-08 | Disposition: A | Discharge status: Home / Self Care | Payer: Medicare Other | Source: Ambulatory Visit | Attending: Pediatrics | Admitting: Pediatrics

## 2011-04-08 ENCOUNTER — Other Ambulatory Visit (HOSPITAL_COMMUNITY): Payer: Self-pay | Admitting: Pediatrics

## 2011-04-08 ENCOUNTER — Encounter (HOSPITAL_COMMUNITY): Payer: Medicare Other

## 2011-04-08 ENCOUNTER — Encounter (HOSPITAL_COMMUNITY): Payer: Self-pay

## 2011-04-08 DIAGNOSIS — J449 Chronic obstructive pulmonary disease, unspecified: Secondary | ICD-10-CM

## 2011-04-08 DIAGNOSIS — R05 Cough: Secondary | ICD-10-CM | POA: Insufficient documentation

## 2011-04-08 DIAGNOSIS — J4489 Other specified chronic obstructive pulmonary disease: Secondary | ICD-10-CM | POA: Insufficient documentation

## 2011-04-08 DIAGNOSIS — R0602 Shortness of breath: Secondary | ICD-10-CM | POA: Insufficient documentation

## 2011-04-08 DIAGNOSIS — R059 Cough, unspecified: Secondary | ICD-10-CM | POA: Insufficient documentation

## 2011-04-08 DIAGNOSIS — R0789 Other chest pain: Secondary | ICD-10-CM | POA: Insufficient documentation

## 2011-04-13 ENCOUNTER — Encounter (HOSPITAL_COMMUNITY): Payer: Medicare Other

## 2011-04-15 ENCOUNTER — Encounter (HOSPITAL_COMMUNITY): Payer: Medicare Other

## 2011-04-20 ENCOUNTER — Encounter (HOSPITAL_COMMUNITY)
Admission: RE | Admit: 2011-04-20 | Discharge: 2011-04-20 | Disposition: A | Discharge status: Home / Self Care | Payer: Medicare Other | Source: Ambulatory Visit | Attending: Pulmonary Disease | Admitting: Pulmonary Disease

## 2011-04-20 DIAGNOSIS — J449 Chronic obstructive pulmonary disease, unspecified: Secondary | ICD-10-CM | POA: Insufficient documentation

## 2011-04-20 DIAGNOSIS — J4489 Other specified chronic obstructive pulmonary disease: Secondary | ICD-10-CM | POA: Insufficient documentation

## 2011-04-20 DIAGNOSIS — Z5189 Encounter for other specified aftercare: Secondary | ICD-10-CM | POA: Insufficient documentation

## 2011-04-22 ENCOUNTER — Encounter (HOSPITAL_COMMUNITY)
Admission: RE | Admit: 2011-04-22 | Discharge: 2011-04-22 | Disposition: A | Discharge status: Home / Self Care | Payer: Medicare Other | Source: Ambulatory Visit | Attending: Pulmonary Disease | Admitting: Pulmonary Disease

## 2011-04-27 ENCOUNTER — Encounter (HOSPITAL_COMMUNITY)
Admission: RE | Admit: 2011-04-27 | Discharge: 2011-04-27 | Disposition: A | Discharge status: Home / Self Care | Payer: Medicare Other | Source: Ambulatory Visit | Attending: Pulmonary Disease | Admitting: Pulmonary Disease

## 2011-04-29 ENCOUNTER — Encounter (HOSPITAL_COMMUNITY)
Admission: RE | Admit: 2011-04-29 | Discharge: 2011-04-29 | Disposition: A | Discharge status: Home / Self Care | Payer: Medicare Other | Source: Ambulatory Visit | Attending: Pulmonary Disease | Admitting: Pulmonary Disease

## 2011-05-04 ENCOUNTER — Encounter (HOSPITAL_COMMUNITY): Payer: Medicare Other

## 2011-05-06 ENCOUNTER — Encounter (HOSPITAL_COMMUNITY): Payer: Medicare Other

## 2011-05-11 ENCOUNTER — Encounter (HOSPITAL_COMMUNITY): Payer: Medicare Other

## 2011-05-13 ENCOUNTER — Encounter (HOSPITAL_COMMUNITY)
Admission: RE | Admit: 2011-05-13 | Discharge: 2011-05-13 | Disposition: A | Discharge status: Home / Self Care | Payer: Medicare Other | Source: Ambulatory Visit | Attending: Pulmonary Disease | Admitting: Pulmonary Disease

## 2011-05-13 DIAGNOSIS — J449 Chronic obstructive pulmonary disease, unspecified: Secondary | ICD-10-CM | POA: Insufficient documentation

## 2011-05-13 DIAGNOSIS — J4489 Other specified chronic obstructive pulmonary disease: Secondary | ICD-10-CM | POA: Insufficient documentation

## 2011-05-13 DIAGNOSIS — Z5189 Encounter for other specified aftercare: Secondary | ICD-10-CM | POA: Insufficient documentation

## 2011-05-18 ENCOUNTER — Encounter (HOSPITAL_COMMUNITY)
Admission: RE | Admit: 2011-05-18 | Discharge: 2011-05-18 | Disposition: A | Discharge status: Home / Self Care | Payer: Medicare Other | Source: Ambulatory Visit | Attending: Pulmonary Disease | Admitting: Pulmonary Disease

## 2011-05-20 ENCOUNTER — Encounter (HOSPITAL_COMMUNITY): Payer: Medicare Other

## 2011-05-25 ENCOUNTER — Encounter (HOSPITAL_COMMUNITY)
Admission: RE | Admit: 2011-05-25 | Discharge: 2011-05-25 | Disposition: A | Discharge status: Home / Self Care | Payer: Medicare Other | Source: Ambulatory Visit | Attending: Pulmonary Disease | Admitting: Pulmonary Disease

## 2011-05-27 ENCOUNTER — Encounter (HOSPITAL_COMMUNITY)
Admission: RE | Admit: 2011-05-27 | Discharge: 2011-05-27 | Disposition: A | Discharge status: Home / Self Care | Payer: Medicare Other | Source: Ambulatory Visit | Attending: Pulmonary Disease | Admitting: Pulmonary Disease

## 2011-05-28 LAB — CBC
HCT: 37.2
MCHC: 34
MCV: 88.4
Platelets: 315
Platelets: 338
RBC: 4.2
RDW: 13.2
WBC: 13.8 — ABNORMAL HIGH

## 2011-05-28 LAB — BASIC METABOLIC PANEL
BUN: 14
BUN: 17
CO2: 27
Calcium: 9.1
Chloride: 103
Creatinine, Ser: 0.65
Creatinine, Ser: 0.66
GFR calc Af Amer: >60
GFR calc non Af Amer: >60
GFR calc non Af Amer: >60
Glucose, Bld: 167 — ABNORMAL HIGH
Potassium: 3.5

## 2011-05-28 LAB — DIFFERENTIAL
Basophils Absolute: 0
Basophils Relative: 0
Lymphocytes Relative: 7 — ABNORMAL LOW
Lymphocytes Relative: 7 — ABNORMAL LOW
Lymphs Abs: 0.9
Monocytes Absolute: 0.4
Monocytes Relative: 2 — ABNORMAL LOW
Neutro Abs: 12.4 — ABNORMAL HIGH
Neutrophils Relative %: 91 — ABNORMAL HIGH

## 2011-05-28 LAB — HEMOGLOBIN A1C
Hgb A1c MFr Bld: 5.7
Mean Plasma Glucose: 126

## 2011-06-01 ENCOUNTER — Encounter (HOSPITAL_COMMUNITY)
Admission: RE | Admit: 2011-06-01 | Discharge: 2011-06-01 | Disposition: A | Discharge status: Home / Self Care | Payer: Medicare Other | Source: Ambulatory Visit | Attending: Pulmonary Disease | Admitting: Pulmonary Disease

## 2011-06-03 ENCOUNTER — Encounter (HOSPITAL_COMMUNITY)
Admission: RE | Admit: 2011-06-03 | Discharge: 2011-06-03 | Disposition: A | Discharge status: Home / Self Care | Payer: Medicare Other | Source: Ambulatory Visit | Attending: Pulmonary Disease | Admitting: Pulmonary Disease

## 2011-06-08 ENCOUNTER — Encounter (HOSPITAL_COMMUNITY): Payer: Medicare Other

## 2011-06-10 ENCOUNTER — Encounter (HOSPITAL_COMMUNITY): Payer: Medicare Other

## 2011-06-12 LAB — CBC
HCT: 37
MCV: 86.4
Platelets: 306
RDW: 13.1

## 2011-06-12 LAB — DIFFERENTIAL
Basophils Absolute: 0
Lymphocytes Relative: 7 — ABNORMAL LOW
Monocytes Absolute: 0.8
Monocytes Relative: 7
Neutro Abs: 10.4 — ABNORMAL HIGH

## 2011-06-12 LAB — URINALYSIS, ROUTINE W REFLEX MICROSCOPIC
Nitrite: NEGATIVE
Specific Gravity, Urine: >1.03 — ABNORMAL HIGH
Urobilinogen, UA: 0.2
pH: 6

## 2011-06-12 LAB — EXPECTORATED SPUTUM ASSESSMENT W GRAM STAIN, RFLX TO RESP C

## 2011-06-12 LAB — PROTIME-INR
INR: 1
Prothrombin Time: 13

## 2011-06-12 LAB — CULTURE, RESPIRATORY W GRAM STAIN: Culture: NORMAL

## 2011-06-12 LAB — COMPREHENSIVE METABOLIC PANEL
Albumin: 3.5
BUN: 19
Creatinine, Ser: 0.81
Total Bilirubin: 0.8
Total Protein: 6

## 2011-06-12 LAB — APTT: aPTT: 31

## 2011-06-15 ENCOUNTER — Encounter (HOSPITAL_COMMUNITY): Payer: Medicare Other

## 2011-06-17 ENCOUNTER — Encounter (HOSPITAL_COMMUNITY): Payer: Medicare Other

## 2011-06-22 ENCOUNTER — Encounter (HOSPITAL_COMMUNITY): Payer: Medicare Other

## 2011-06-24 ENCOUNTER — Encounter (HOSPITAL_COMMUNITY): Payer: Medicare Other

## 2011-06-29 ENCOUNTER — Encounter (HOSPITAL_COMMUNITY): Payer: Medicare Other

## 2011-07-01 ENCOUNTER — Encounter (HOSPITAL_COMMUNITY): Payer: Medicare Other

## 2011-07-01 ENCOUNTER — Encounter (HOSPITAL_COMMUNITY)
Admission: RE | Admit: 2011-07-01 | Discharge: 2011-07-01 | Disposition: A | Discharge status: Home / Self Care | Payer: Medicare Other | Source: Ambulatory Visit | Attending: Pulmonary Disease | Admitting: Pulmonary Disease

## 2011-07-01 DIAGNOSIS — Z5189 Encounter for other specified aftercare: Secondary | ICD-10-CM | POA: Insufficient documentation

## 2011-07-01 DIAGNOSIS — J4489 Other specified chronic obstructive pulmonary disease: Secondary | ICD-10-CM | POA: Insufficient documentation

## 2011-07-01 DIAGNOSIS — J449 Chronic obstructive pulmonary disease, unspecified: Secondary | ICD-10-CM | POA: Insufficient documentation

## 2011-07-06 ENCOUNTER — Encounter (HOSPITAL_COMMUNITY): Payer: Medicare Other

## 2011-07-08 ENCOUNTER — Encounter (HOSPITAL_COMMUNITY)
Admission: RE | Admit: 2011-07-08 | Discharge: 2011-07-08 | Disposition: A | Discharge status: Home / Self Care | Payer: Medicare Other | Source: Ambulatory Visit | Attending: Pulmonary Disease | Admitting: Pulmonary Disease

## 2011-07-13 ENCOUNTER — Encounter: Payer: Self-pay | Admitting: *Deleted

## 2011-07-13 ENCOUNTER — Encounter: Payer: Self-pay | Admitting: Critical Care Medicine

## 2011-07-13 ENCOUNTER — Encounter (HOSPITAL_COMMUNITY)
Admission: RE | Admit: 2011-07-13 | Discharge: 2011-07-13 | Disposition: A | Discharge status: Home / Self Care | Payer: Medicare Other | Source: Ambulatory Visit | Attending: Pulmonary Disease | Admitting: Pulmonary Disease

## 2011-07-13 DIAGNOSIS — Z5189 Encounter for other specified aftercare: Secondary | ICD-10-CM | POA: Insufficient documentation

## 2011-07-13 DIAGNOSIS — J4489 Other specified chronic obstructive pulmonary disease: Secondary | ICD-10-CM | POA: Insufficient documentation

## 2011-07-13 DIAGNOSIS — J449 Chronic obstructive pulmonary disease, unspecified: Secondary | ICD-10-CM | POA: Insufficient documentation

## 2011-07-14 ENCOUNTER — Encounter: Payer: Self-pay | Admitting: Critical Care Medicine

## 2011-07-14 ENCOUNTER — Ambulatory Visit (INDEPENDENT_AMBULATORY_CARE_PROVIDER_SITE_OTHER): Payer: Medicare Other | Admitting: Critical Care Medicine

## 2011-07-14 VITALS — BP 138/64 | HR 89 | Temp 97.9°F | Ht 60.0 in | Wt 118.6 lb

## 2011-07-14 DIAGNOSIS — F3289 Other specified depressive episodes: Secondary | ICD-10-CM

## 2011-07-14 DIAGNOSIS — J4489 Other specified chronic obstructive pulmonary disease: Secondary | ICD-10-CM

## 2011-07-14 DIAGNOSIS — E039 Hypothyroidism, unspecified: Secondary | ICD-10-CM

## 2011-07-14 DIAGNOSIS — F320 Major depressive disorder, single episode, mild: Secondary | ICD-10-CM

## 2011-07-14 DIAGNOSIS — I10 Essential (primary) hypertension: Secondary | ICD-10-CM

## 2011-07-14 DIAGNOSIS — E785 Hyperlipidemia, unspecified: Secondary | ICD-10-CM

## 2011-07-14 DIAGNOSIS — J449 Chronic obstructive pulmonary disease, unspecified: Secondary | ICD-10-CM

## 2011-07-14 DIAGNOSIS — F329 Major depressive disorder, single episode, unspecified: Secondary | ICD-10-CM | POA: Insufficient documentation

## 2011-07-14 DIAGNOSIS — F32A Depression, unspecified: Secondary | ICD-10-CM | POA: Insufficient documentation

## 2011-07-14 MED ORDER — ALBUTEROL SULFATE (2.5 MG/3ML) 0.083% IN NEBU: 2.5000 mg | INHALATION_SOLUTION | Freq: Three times a day (TID) | RESPIRATORY_TRACT | Status: DC

## 2011-07-14 NOTE — Progress Notes (Signed)
Subjective:    Patient ID: [name], female    DOB: 05/06/1941, 70 y.o.   MRN: [id number]  HPI Comments: Dx Copd 5+years. Quit smoking after many years of smoking about 2 years ago. Sees [facility] for copd. This is a second opinion consult Has oxygen QHS and prn daytime.  Does not have portable oxygen. [facility] Has had bronchitis spells three or four times per year now, Rx with ABX and Steroid inj and pred pulse  Could not try pred constantly d/t side effects of swelling  Shortness of Breath This is a chronic problem. The current episode started more than 1 year ago. The problem occurs daily (exertion only,  eg: 10ft or more,  worse up incline or steps.  ). The problem has been gradually worsening. Duration: Can recover in 5minutes. Associated symptoms include chest pain, leg swelling, neck pain, orthopnea, rhinorrhea, sputum production, vomiting and wheezing. Pertinent negatives include no abdominal pain, claudication, ear pain, fever, headaches, hemoptysis, leg pain, PND, rash or sore throat. The symptoms are aggravated by any activity, fumes, odors, pollens, lying flat, weather changes, URIs, emotional upset and smoke. Associated symptoms comments: Notes chest tightness worse with exertion. Risk factors include smoking. She has tried beta agonist inhalers, oral steroids, steroid inhalers and ipratropium inhalers for the symptoms. The treatment provided moderate relief. Her past medical history is significant for chronic lung disease, COPD and pneumonia. There is no history of allergies, aspirin allergies, asthma, bronchiolitis, CAD, DVT, a heart failure, PE or a recent surgery. (PNA 2/12, in hosp )   70 y.o. F with Copd    Past Medical History  Diagnosis Date  . COPD (chronic obstructive pulmonary disease)   . Hypertension   . Hyperlipidemia   . Hypothyroidism   . History of tobacco abuse   . Mild depression      Family History  Problem Relation Age of Onset  . Allergies  Father   . Heart disease Mother   . Rheum arthritis Mother   . Breast cancer Daughter   . Breast cancer Sister   . Cancer Maternal Aunt      History   Social History  . Marital Status: Married    Spouse Name: N/A    Number of Children: 2  . Years of Education: N/A   Occupational History  . retired     hairdresser   Social History Main Topics  . Smoking status: Former Smoker  . Smokeless tobacco: Never Used  . Alcohol Use: No  . Drug Use: No  . Sexually Active: Not on file   Other Topics Concern  . Not on file   Social History Narrative  . No narrative on file     No Known Allergies   Outpatient Prescriptions Prior to Visit  Medication Sig Dispense Refill  . buPROPion (WELLBUTRIN XL) 150 MG 24 hr tablet Take 150 mg by mouth daily.        . levothyroxine (SYNTHROID, LEVOTHROID) 25 MCG tablet Take 25 mcg by mouth daily.        . pravastatin (PRAVACHOL) 20 MG tablet Take 20 mg by mouth daily.        . tiotropium (SPIRIVA) 18 MCG inhalation capsule Place 18 mcg into inhaler and inhale daily.        . Fluticasone-Salmeterol (ADVAIR DISKUS) 250-50 MCG/DOSE AEPB Inhale 1 puff into the lungs every 12 (twelve) hours.           Review of Systems    Constitutional: Positive for appetite change, fatigue and unexpected weight change. Negative for fever, chills, diaphoresis and activity change.  HENT: Positive for nosebleeds, congestion, facial swelling, rhinorrhea, sneezing, trouble swallowing, neck pain, neck stiffness, dental problem, voice change, postnasal drip and sinus pressure. Negative for hearing loss, ear pain, sore throat, mouth sores, tinnitus and ear discharge.   Eyes: Positive for discharge, itching and visual disturbance. Negative for photophobia.  Respiratory: Positive for cough, sputum production, chest tightness, shortness of breath and wheezing. Negative for apnea, hemoptysis, choking and stridor.   Cardiovascular: Positive for chest pain, orthopnea and leg  swelling. Negative for palpitations, claudication and PND.  Gastrointestinal: Positive for nausea and vomiting. Negative for abdominal pain, constipation, blood in stool and abdominal distention.  Genitourinary: Negative for dysuria, urgency, frequency, hematuria, flank pain, decreased urine volume and difficulty urinating.  Musculoskeletal: Negative for myalgias, back pain, joint swelling, arthralgias and gait problem.  Skin: Negative for color change, pallor and rash.  Neurological: Positive for dizziness, tremors and weakness. Negative for seizures, syncope, speech difficulty, light-headedness, numbness and headaches.  Hematological: Negative for adenopathy. Bruises/bleeds easily.  Psychiatric/Behavioral: Positive for confusion, sleep disturbance and agitation. The patient is nervous/anxious.        Objective:   Physical Exam  Filed Vitals:   07/14/11 1424  BP: 138/64  Pulse: 89  Temp: 97.9 F (36.6 C)  TempSrc: Oral  Height: 5' (1.524 m)  Weight: 118 lb 9.6 oz (53.797 kg)  SpO2: 91%    Gen: Pleasant, well-nourished, in no distress,  normal affect  ENT: No lesions,  mouth clear,  oropharynx clear, no postnasal drip  Neck: No JVD, no TMG, no carotid bruits  Lungs: No use of accessory muscles, no dullness to percussion, distant BS  Cardiovascular: RRR, heart sounds normal, no murmur or gallops, no peripheral edema  Abdomen: soft and NT, no HSM,  BS normal  Musculoskeletal: No deformities, no cyanosis or clubbing  Neuro: alert, non focal  Skin: Warm, no lesions or rashes   Sats on portable concentrator 2L  80% Sats on 2L sats 94%    Spiro: severe airflow obstruction  FeV1  18% pred  Assessment & Plan:   COPD (chronic obstructive pulmonary disease) 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 albuterol (PROVENTIL) (2.5 MG/3ML) 0.083% nebulizer solution Take 3 mLs (2.5 mg total) by nebulization 3 (three) times daily.  120 mL  6  . albuterol (VENTOLIN HFA) 108 (90 BASE) MCG/ACT inhaler Inhale 2 puffs into the lungs every 6 (six) hours as needed.        . ALPRAZolam (XANAX) 0.25 MG tablet Take 0.25 mg by mouth at bedtime as needed.        . buPROPion (WELLBUTRIN XL) 150 MG 24 hr tablet Take 150 mg by mouth daily.        . furosemide (LASIX) 20 MG tablet Take 20 mg by mouth daily.        . guaiFENesin (MUCINEX) 600 MG 12 hr tablet Take 1,200 mg by mouth as needed.        . levothyroxine (SYNTHROID, LEVOTHROID) 25 MCG tablet Take 25 mcg by mouth daily.        . Multiple Vitamin (MULTIVITAMIN)   capsule Take 1 capsule by mouth daily.        . Omega-3 Fatty Acids (FISH OIL) 1200 MG CAPS Take by mouth daily.        . pravastatin (PRAVACHOL) 20 MG tablet Take 20 mg by mouth daily.        . tiotropium (SPIRIVA) 18 MCG inhalation capsule Place 18 mcg into inhaler and inhale daily.        . DISCONTD: albuterol (PROVENTIL) (2.5 MG/3ML) 0.083% nebulizer solution Take 2.5 mg by nebulization every 6 (six) hours as needed.        . DISCONTD: Fluticasone-Salmeterol (ADVAIR DISKUS) 250-50 MCG/DOSE AEPB Inhale 1 puff into the lungs every 12 (twelve) hours.        . DISCONTD: diltiazem (CARDIZEM) 120 MG tablet Take 120 mg by mouth 4 (four) times daily.            

## 2011-07-14 NOTE — Assessment & Plan Note (Signed)
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

## 2011-07-14 NOTE — Patient Instructions (Signed)
Stop advair Start albuterol in nebulizer three times daily Obtain a new portable oxygen system , order sent to [facility] pharmacy Stay on spiriva Return 4 months

## 2011-07-15 ENCOUNTER — Encounter (HOSPITAL_COMMUNITY)
Admission: RE | Admit: 2011-07-15 | Discharge: 2011-07-15 | Disposition: A | Discharge status: Home / Self Care | Payer: Medicare Other | Source: Ambulatory Visit | Attending: Pulmonary Disease | Admitting: Pulmonary Disease

## 2011-07-20 ENCOUNTER — Telehealth: Payer: Self-pay | Admitting: Critical Care Medicine

## 2011-07-20 ENCOUNTER — Encounter: Payer: Self-pay | Admitting: Pulmonary Disease

## 2011-07-20 ENCOUNTER — Encounter (HOSPITAL_COMMUNITY): Payer: Medicare Other

## 2011-07-20 NOTE — Telephone Encounter (Signed)
I spoke with diane for [facility] and she states pt was not set up with the correct portable oxygen from [facility]. I spoke with pt and she states she still has the eclipse portable oxygen. Pt states [facility] never came and set her up with new protable liquid oxygen as order by Dr. Delford Field. I called [facility] and was advised Amy is the one who has been working on pt's chart and is not here today and will have to speak with her to see what is going on. Pt states Amy had advised her that she was trying to find her a portable oxygen that has continuous flow but Amy advised pt she had no found anything yet. Will call Amy back at [facility] pharmacy tomorrow.

## 2011-07-21 ENCOUNTER — Telehealth: Payer: Self-pay | Admitting: Critical Care Medicine

## 2011-07-21 DIAGNOSIS — J449 Chronic obstructive pulmonary disease, unspecified: Secondary | ICD-10-CM

## 2011-07-21 NOTE — Telephone Encounter (Signed)
Order was sent to [facility] for this.  

## 2011-07-21 NOTE — Telephone Encounter (Signed)
LM with co worker to have Amy call back

## 2011-07-21 NOTE — Telephone Encounter (Signed)
See phone note dated 07/21/11

## 2011-07-21 NOTE — Telephone Encounter (Signed)
Spoke with Amy- she states that the reason that pt has not received her portable o2 is because she is to be on o2 continuously and the portable liquid o2 tanks only provide pulsed o2. Dr Delford Field, pls advise thanks!

## 2011-07-21 NOTE — Telephone Encounter (Signed)
The home care company needs to do a best fit test so this pt can get a light weight portable system that she can carry and will provide continuous flow oxygen Send me the report

## 2011-07-22 ENCOUNTER — Encounter (HOSPITAL_COMMUNITY): Payer: Medicare Other

## 2011-07-22 ENCOUNTER — Telehealth: Payer: Self-pay | Admitting: Critical Care Medicine

## 2011-07-22 NOTE — Telephone Encounter (Signed)
Called and spoke with pt. Pt just wanted to inform us that Amy from [facility] in [place] will be coming to her home today to assess pt for the best fit test for light weight portable o2.  Nothing further needed.

## 2011-07-22 NOTE — Telephone Encounter (Signed)
Error.[name] ° °

## 2011-07-24 ENCOUNTER — Telehealth: Payer: Self-pay | Admitting: Critical Care Medicine

## 2011-07-24 DIAGNOSIS — J449 Chronic obstructive pulmonary disease, unspecified: Secondary | ICD-10-CM

## 2011-07-24 NOTE — Telephone Encounter (Signed)
I spoke with pt and she states [facility] needs an order for pt to be on 3 liters oxygen during exertion. I spoke with [facility] and was advised for the same was needed. I advised will send order to [facility] to get that sent over. Nothing further was needed

## 2011-07-27 ENCOUNTER — Encounter (HOSPITAL_COMMUNITY): Payer: Medicare Other

## 2011-07-29 ENCOUNTER — Encounter (HOSPITAL_COMMUNITY): Payer: Medicare Other

## 2011-08-03 ENCOUNTER — Encounter (HOSPITAL_COMMUNITY)
Admission: RE | Admit: 2011-08-03 | Discharge: 2011-08-03 | Disposition: A | Discharge status: Home / Self Care | Payer: Medicare Other | Source: Ambulatory Visit | Attending: Pulmonary Disease | Admitting: Pulmonary Disease

## 2011-08-05 ENCOUNTER — Encounter (HOSPITAL_COMMUNITY)
Admission: RE | Admit: 2011-08-05 | Discharge: 2011-08-05 | Disposition: A | Discharge status: Home / Self Care | Payer: Medicare Other | Source: Ambulatory Visit | Attending: Pulmonary Disease | Admitting: Pulmonary Disease

## 2011-08-10 ENCOUNTER — Encounter (HOSPITAL_COMMUNITY): Payer: Medicare Other

## 2011-08-12 ENCOUNTER — Encounter (HOSPITAL_COMMUNITY): Payer: Medicare Other

## 2011-08-17 ENCOUNTER — Encounter (HOSPITAL_COMMUNITY)
Admission: RE | Admit: 2011-08-17 | Discharge: 2011-08-17 | Disposition: A | Discharge status: Home / Self Care | Payer: Medicare Other | Source: Ambulatory Visit | Attending: Pulmonary Disease | Admitting: Pulmonary Disease

## 2011-08-17 DIAGNOSIS — Z5189 Encounter for other specified aftercare: Secondary | ICD-10-CM | POA: Insufficient documentation

## 2011-08-17 DIAGNOSIS — J4489 Other specified chronic obstructive pulmonary disease: Secondary | ICD-10-CM | POA: Insufficient documentation

## 2011-08-17 DIAGNOSIS — J449 Chronic obstructive pulmonary disease, unspecified: Secondary | ICD-10-CM | POA: Insufficient documentation

## 2011-08-19 ENCOUNTER — Encounter (HOSPITAL_COMMUNITY): Payer: Medicare Other

## 2011-08-21 NOTE — Progress Notes (Signed)
Pulmonary Rehabilitation Program Progress Report   Orientation:  03/12/2011 Graduate Date:  tbd Discharge Date:  tbd # of sessions completed: 3  Pulmonologist: Kari Baars Family MD: Acey Lav  Class Time:  !3:00  A.  Exercise Program:  Tolerates exercise @ 1.8 METS for 15 minutes  B.  Mental Health:  Good mental attitude  C.  Education/Instruction/Skills  Knows THR for exercise and Uses Perceived Exertion Scale and/or Dyspnea Scale  Uses Perceived Exertion Scale and/or Dyspnea Scale  D.  Nutrition/Weight Control/Body Composition:  Adherence to prescribed nutrition program: good   *This section completed by Mickle Plumb, Andres Shad, RD, LDN, CDE  E.  Blood Lipids    No results found for this basename: CHOL     No results found for this basename: TRIG     No results found for this basename: HDL     No results found for this basename: CHOLHDL     No results found for this basename: LDLDIRECT      F.  Lifestyle Changes:  Making positive lifestyle changes  G.  Symptoms noted with exercise:  Asymptomatic  Report Completed By:  Angelica Pou   Comments:  This is Kei's 1st week report. She achieved a Peak mets of 1.8. Her resting HR is 97 and her resting BP is 110/62 and resting O2 is 91. Her peak HR is 106 and Peak O2 is 90 and her peak BP is 150/60. She is motivated to exercise.

## 2011-08-24 ENCOUNTER — Encounter (HOSPITAL_COMMUNITY)
Admission: RE | Admit: 2011-08-24 | Discharge: 2011-08-24 | Disposition: A | Discharge status: Home / Self Care | Payer: Medicare Other | Source: Ambulatory Visit | Attending: Pulmonary Disease | Admitting: Pulmonary Disease

## 2011-08-24 ENCOUNTER — Encounter: Payer: Self-pay | Admitting: Critical Care Medicine

## 2011-08-24 NOTE — Progress Notes (Signed)
Pulmonary Rehabilitation Program Progress Report   Orientation: 03/12/11 Graduate Date:  tbd Discharge Date:  tbd  # of sessions completed: 12  Pulmonologist: Kari Baars Family MD:  Acey Lav Class Time:  13:00  A.  Exercise Program:  Tolerates exercise @ 1.9 METS for 15 minutes  B.  Mental Health:  Good mental attitude  C.  Education/Instruction/Skills  Knows THR for exercise and Uses Perceived Exertion Scale and/or Dyspnea Scale  Uses Perceived Exertion Scale and/or Dyspnea Scale  D.  Nutrition/Weight Control/Body Composition:  Adherence to prescribed nutrition program: good   *This section completed by [name], [name], RD, LDN, CDE  E.  Blood Lipids    No results found for this basename: CHOL     No results found for this basename: TRIG     No results found for this basename: HDL     No results found for this basename: CHOLHDL     No results found for this basename: LDLDIRECT      F.  Lifestyle Changes:  Making positive lifestyle changes  G.  Symptoms noted with exercise:  Asymptomatic  Report Completed By:  [name]   Comments:  This is [name]'s halfway report. She has achieved a peak mets of 1.9. Her resting HR is 88 and her resting BP is 130/50. Her peak exercise HR is 93 and her peak exercise BP is 160/70. She does well on our O2 here. But we don't think the little one she carrys is doing what it should. Have spoken to [facility] about changing supply company's. A report will follow on her graduation day.

## 2011-08-25 ENCOUNTER — Encounter: Payer: Self-pay | Admitting: Internal Medicine

## 2011-08-25 ENCOUNTER — Ambulatory Visit (INDEPENDENT_AMBULATORY_CARE_PROVIDER_SITE_OTHER): Payer: Medicare Other | Admitting: Internal Medicine

## 2011-08-25 VITALS — BP 176/92 | HR 102 | Temp 98.0°F | Ht 60.0 in | Wt 116.0 lb

## 2011-08-25 DIAGNOSIS — J449 Chronic obstructive pulmonary disease, unspecified: Secondary | ICD-10-CM

## 2011-08-25 MED ORDER — AZITHROMYCIN 250 MG PO TABS
ORAL_TABLET | ORAL | Status: AC
Start: 2011-08-25 — End: 2011-08-30

## 2011-08-25 MED ORDER — PREDNISONE (PAK) 10 MG PO TABS: ORAL_TABLET | ORAL | Status: AC

## 2011-08-25 NOTE — Patient Instructions (Signed)
Robitussin cough syrup  Zpak called in   Prednisone 10 mg take  4 each am x 2 days,   2 each am x 2 days,  1 each am x2days and stop   Stop fish oil and just use two servings of fish per week instead  See Dr Delford Field if not improving back to normal after Christmas  Ok to use nebulizer up to every 4 hours if needed

## 2011-08-25 NOTE — Progress Notes (Signed)
Subjective:     Patient ID: [name], female   DOB: 1941-08-25, 70 y.o.   MRN: [id number]  HPI  32 yowf quit smoking 2010 followed by Dr Delford Field "going downhill" since Feb 2012  07/14/11 ov/[facility] rec Stop advair Start albuterol in nebulizer three times daily Obtain a new portable oxygen system , order sent to [facility] pharmacy Stay on spiriva  08/25/2011 f/u ov/[name] cc sob much worse x 2 weeks with cough and congestion   large amounts of green/grey sputum x 5 days.  No pleuritic cp. Sob with more than slow adls and using both saba hfa and neb around the clock.  Sleeping ok without nocturnal  or early am exacerbation  of respiratory  c/o's or need for noct saba. Also denies any obvious fluctuation of symptoms with weather or environmental changes or other aggravating or alleviating factors except as outlined above   ROS  At present neg for  any significant sore throat, dysphagia, itching, sneezing,  nasal congestion or excess/ purulent secretions,  fever, chills, sweats, unintended wt loss, pleuritic or exertional cp, hempoptysis, orthopnea pnd or leg swelling.  Also denies presyncope, palpitations, heartburn, abdominal pain, nausea, vomiting, diarrhea  or change in bowel or urinary habits, dysuria,hematuria,  rash, arthralgias, visual complaints, headache, numbness weakness or ataxia.      Review of Systems     Objective:   Physical Exam    talkative amb wf nad HEENT mild turbinate edema.  Oropharynx no thrush or excess pnd or cobblestoning.  No JVD or cervical adenopathy. Mild accessory muscle hypertrophy. Trachea midline, nl thryroid. Chest was hyperinflated by percussion with diminished breath sounds and moderate increased exp time without wheeze. Hoover sign positive at mid inspiration. Regular rate and rhythm without murmur gallop or rub or increase P2 or edema.  Abd: no hsm, nl excursion. Ext warm without cyanosis or clubbing.     Assessment:         Plan:

## 2011-08-25 NOTE — Progress Notes (Signed)
Subjective:    Patient ID: [name], female    DOB: 10/09/1940, 70 y.o.   MRN: [id number]  HPI Comments: Dx Copd 5+years. Quit smoking after many years of smoking about 2 years ago. Sees [facility] for copd. This is a second opinion consult Has oxygen QHS and prn daytime.  Does not have portable oxygen. Uses [facility]  [facility]armacy Has had bronchitis spells three or four times per year now, Rx with ABX and Steroid inj and pred pulse  Could not try pred constantly d/t side effects of swelling  Shortness of Breath This is a chronic problem. The current episode started more than 1 year ago. The problem occurs daily (exertion only,  eg: 64ft or more,  worse up incline or steps.  ). The problem has been gradually worsening. Duration: Can recover in . Associated symptoms include chest pain, leg swelling, neck pain, orthopnea, rhinorrhea, sputum production, vomiting and wheezing. Pertinent negatives include no abdominal pain, claudication, ear pain, fever, headaches, hemoptysis, leg pain, PND, rash or sore throat. The symptoms are aggravated by any activity, fumes, odors, pollens, lying flat, weather changes, URIs, emotional upset and smoke. Associated symptoms comments: Notes chest tightness worse with exertion. Risk factors include smoking. She has tried beta agonist inhalers, oral steroids, steroid inhalers and ipratropium inhalers for the symptoms. The treatment provided moderate relief. Her past medical history is significant for chronic lung disease, COPD and pneumonia. There is no history of allergies, aspirin allergies, asthma, bronchiolitis, CAD, DVT, a heart failure, PE or a recent surgery. (PNA 2/12, in hosp )   70 y.o. F with Copd    Past Medical History  Diagnosis Date  . COPD (chronic obstructive pulmonary disease)   . Hypertension   . Hyperlipidemia   . Hypothyroidism   . History of tobacco abuse   . Mild depression      Family History  Problem Relation Age of Onset  . Allergies  Father   . Heart disease Mother   . Rheum arthritis Mother   . Breast cancer Daughter   . Breast cancer Sister   . Cancer Maternal Aunt      History   Social History  . Marital Status: Married    Spouse Name: N/A    Number of Children: 2  . Years of Education: N/A   Occupational History  . retired     Interior and spatial designer   Social History Main Topics  . Smoking status: Former Games developer  . Smokeless tobacco: Never Used  . Alcohol Use: No  . Drug Use: No  . Sexually Active: Not on file   Other Topics Concern  . Not on file   Social History Narrative  . No narrative on file     No Known Allergies   Outpatient Prescriptions Prior to Visit  Medication Sig Dispense Refill  . buPROPion (WELLBUTRIN XL) 150 MG 24 hr tablet Take 150 mg by mouth daily.        Marland Kitchen levothyroxine (SYNTHROID, LEVOTHROID) 25 MCG tablet Take 25 mcg by mouth daily.        . pravastatin (PRAVACHOL) 20 MG tablet Take 20 mg by mouth daily.        Marland Kitchen tiotropium (SPIRIVA) 18 MCG inhalation capsule Place 18 mcg into inhaler and inhale daily.        . Fluticasone-Salmeterol (ADVAIR DISKUS) 250-50 MCG/DOSE AEPB Inhale 1 puff into the lungs every 12 (twelve) hours.           Review of Systems  Constitutional: Positive for appetite change, fatigue and unexpected weight change. Negative for fever, chills, diaphoresis and activity change.  HENT: Positive for nosebleeds, congestion, facial swelling, rhinorrhea, sneezing, trouble swallowing, neck pain, neck stiffness, dental problem, voice change, postnasal drip and sinus pressure. Negative for hearing loss, ear pain, sore throat, mouth sores, tinnitus and ear discharge.   Eyes: Positive for discharge, itching and visual disturbance. Negative for photophobia.  Respiratory: Positive for cough, sputum production, chest tightness, shortness of breath and wheezing. Negative for apnea, hemoptysis, choking and stridor.   Cardiovascular: Positive for chest pain, orthopnea and leg  swelling. Negative for palpitations, claudication and PND.  Gastrointestinal: Positive for nausea and vomiting. Negative for abdominal pain, constipation, blood in stool and abdominal distention.  Genitourinary: Negative for dysuria, urgency, frequency, hematuria, flank pain, decreased urine volume and difficulty urinating.  Musculoskeletal: Negative for myalgias, back pain, joint swelling, arthralgias and gait problem.  Skin: Negative for color change, pallor and rash.  Neurological: Positive for dizziness, tremors and weakness. Negative for seizures, syncope, speech difficulty, light-headedness, numbness and headaches.  Hematological: Negative for adenopathy. Bruises/bleeds easily.  Psychiatric/Behavioral: Positive for confusion, sleep disturbance and agitation. The patient is nervous/anxious.        Objective:   Physical Exam  Filed Vitals:   07/14/11 1424  BP: 138/64  Pulse: 89  Temp: 97.9 F (36.6 C)  TempSrc: Oral  Height: 5' (1.524 m)  Weight: 118 lb 9.6 oz (53.797 kg)  SpO2: 91%    Gen: Pleasant, well-nourished, in no distress,  normal affect  ENT: No lesions,  mouth clear,  oropharynx clear, no postnasal drip  Neck: No JVD, no TMG, no carotid bruits  Lungs: No use of accessory muscles, no dullness to percussion, distant BS  Cardiovascular: RRR, heart sounds normal, no murmur or gallops, no peripheral edema  Abdomen: soft and NT, no HSM,  BS normal  Musculoskeletal: No deformities, no cyanosis or clubbing  Neuro: alert, non focal  Skin: Warm, no lesions or rashes   Sats on portable concentrator 2L  80% Sats on 2L sats 94%    Spiro: severe airflow obstruction  FeV1  18% pred  Assessment & Plan:   COPD (chronic obstructive pulmonary disease) 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 albuterol (PROVENTIL) (2.5 MG/3ML) 0.083% nebulizer solution Take 3 mLs (2.5 mg total) by nebulization 3 (three) times daily.  120 mL  6  . albuterol (VENTOLIN HFA) 108 (90 BASE) MCG/ACT inhaler Inhale 2 puffs into the lungs every 6 (six) hours as needed.        . ALPRAZolam (XANAX) 0.25 MG tablet Take 0.25 mg by mouth at bedtime as needed.        Marland Kitchen buPROPion (WELLBUTRIN XL) 150 MG 24 hr tablet Take 150 mg by mouth daily.        . furosemide (LASIX) 20 MG tablet Take 20 mg by mouth daily.        Marland Kitchen guaiFENesin (MUCINEX) 600 MG 12 hr tablet Take 1,200 mg by mouth as needed.        Marland Kitchen levothyroxine (SYNTHROID, LEVOTHROID) 25 MCG tablet Take 25 mcg by mouth daily.        . Multiple Vitamin (MULTIVITAMIN)  capsule Take 1 capsule by mouth daily.        . Omega-3 Fatty Acids (FISH OIL) 1200 MG CAPS Take by mouth daily.        . pravastatin (PRAVACHOL) 20 MG tablet Take 20 mg by mouth daily.        Marland Kitchen tiotropium (SPIRIVA) 18 MCG inhalation capsule Place 18 mcg into inhaler and inhale daily.        Marland Kitchen DISCONTD: albuterol (PROVENTIL) (2.5 MG/3ML) 0.083% nebulizer solution Take 2.5 mg by nebulization every 6 (six) hours as needed.        Marland Kitchen DISCONTD: Fluticasone-Salmeterol (ADVAIR DISKUS) 250-50 MCG/DOSE AEPB Inhale 1 puff into the lungs every 12 (twelve) hours.        Marland Kitchen DISCONTD: diltiazem (CARDIZEM) 120 MG tablet Take 120 mg by mouth 4 (four) times daily.

## 2011-08-26 ENCOUNTER — Encounter (HOSPITAL_COMMUNITY)
Admission: RE | Admit: 2011-08-26 | Discharge: 2011-08-26 | Disposition: A | Discharge status: Home / Self Care | Payer: Medicare Other | Source: Ambulatory Visit | Attending: Pulmonary Disease | Admitting: Pulmonary Disease

## 2011-08-26 ENCOUNTER — Telehealth: Payer: Self-pay | Admitting: Internal Medicine

## 2011-08-26 NOTE — Telephone Encounter (Signed)
Pt is returning triage's call.  Please call back.  [name]

## 2011-08-26 NOTE — Telephone Encounter (Signed)
1st point is that her body makes prednisone so this isn't an allergy  2nd is that 6 days was designed to give her the biggest benefit and the least side effects, and should try to do her best to take it

## 2011-08-26 NOTE — Telephone Encounter (Signed)
I spoke with pt and made her aware of mw recs. She voiced her understanding and states she will try and take the prednisone. Pt aware to call us if she begins to experience any symptoms after taking the prednisone. She voiced her understanding.

## 2011-08-26 NOTE — Telephone Encounter (Signed)
I spoke with pt and she states she is allergic reaction to the prednisone in august 2012. She states she swelled up all over her body in 2 weeks time. She states it burned and tingled when she tried to walked after taking the prednisone. Pt is wanting to know if Dr. Sherene Sires feels like she should be okay if she starts the pred taper that was rx'd to her yesterday or can it be changed to something different. Please advise Dr. Sherene Sires, thanks

## 2011-08-26 NOTE — Telephone Encounter (Signed)
[email or web address]  

## 2011-08-27 NOTE — Assessment & Plan Note (Addendum)
GOLD IV COPD poorly controlled symptoms ? etiology  DDX of  difficult airways managment all start with A and  include Adherence, Ace Inhibitors, Acid Reflux, Active Sinus Disease, Alpha 1 Antitripsin deficiency, Anxiety masquerading as Airways dz,  ABPA,  allergy(esp in young), Aspiration (esp in elderly), Adverse effects of DPI,  Active smokers, plus two Bs  = Bronchiectasis and Beta blocker use..and one C= CHF  Adherence is always the initial "prime suspect" and is a multilayered concern that requires a "trust but verify" approach in every patient - starting with knowing how to use medications, especially inhalers, correctly, keeping up with refills and understanding the fundamental difference between maintenance and prns vs those medications only taken for a very short course and then stopped and not refilled. She's way over using saba at baseline and doesn't really have a good action plan in event of worsening  ? Acid or non-acid reflux > try off Fish oil/ diet reviewed

## 2011-09-02 NOTE — Progress Notes (Signed)
Pulmonary Rehabilitation Program Progress Report   Orientation:  03/12/2011 Graduate Date:  08/26/2011 Discharge Date:  08/26/2011 # of sessions completed: 24  Pulmonologist: Dr. Ellwood Dense Family MD: Acey Lav Class Time:  13:00  A.  Exercise Program:  Tolerates exercise @ 1.9 METS for 15 minutes, Exercise limited by dyspnea and Discharged to home exercise program.  Anticipated compliance:  excellent  B.  Mental Health:  Good mental attitude  C.  Education/Instruction/Skills  Knows THR for exercise, Uses Perceived Exertion Scale and/or Dyspnea Scale and Attended all education classes  Demonstrates accurate pursed lip breathing  D.  Nutrition/Weight Control/Body Composition:  Adherence to prescribed nutrition program: good   *This section completed by Mickle Plumb, Andres Shad, RD, LDN, CDE  E.  Blood Lipids    No results found for this basename: CHOL     No results found for this basename: TRIG     No results found for this basename: HDL     No results found for this basename: CHOLHDL     No results found for this basename: LDLDIRECT      F.  Lifestyle Changes:  Making positive lifestyle changes  G.  Symptoms noted with exercise:  Asymptomatic  Report Completed By:  Angelica Pou   Comments:  Mrs. Muro has completed her 24 visits as a pulmonary patient and has achieved a peak mets Of 1.9 and progressed nicly to 10 minutes of strength and flexibility exercises.She has met with the dietician. DC instructions have been given and gone over throughley. Patient verbalized Understanding. Pulmonary rehab will check up on her at 1 month, 6 months, and at 1 year. She is very motivated to continue exercising

## 2011-09-07 ENCOUNTER — Telehealth: Payer: Self-pay | Admitting: Critical Care Medicine

## 2011-09-07 MED ORDER — DOXYCYCLINE HYCLATE 100 MG PO TABS: 100.0000 mg | ORAL_TABLET | Freq: Two times a day (BID) | ORAL | Status: AC

## 2011-09-07 NOTE — Telephone Encounter (Signed)
Prod cough with green/gray mucus, increased SOB, wheezing x3days - denies f/c/s.  Pt also requesting refills on her spiriva to be sent after the new year.  PEW not in office to address her sick symptoms, will forward to doc of the day.    Dr Sherene Sires please advise, thanks!  No Known Allergies  [facility] [place].

## 2011-09-07 NOTE — Telephone Encounter (Signed)
Doxycycline 100 mg twice daily x 7 days then needs ov to regoup (too many phone messages back and forth without sustained improvement)

## 2011-09-07 NOTE — Telephone Encounter (Signed)
Pt is scheduled to come in to see TP on 09/14/11 at 3:30 to regroup. Pt aware doxycyline 100mg  was called into pharmacy for pt.

## 2011-09-09 ENCOUNTER — Other Ambulatory Visit: Payer: Self-pay | Admitting: *Deleted

## 2011-09-09 MED ORDER — TIOTROPIUM BROMIDE MONOHYDRATE 18 MCG IN CAPS: 18.0000 ug | ORAL_CAPSULE | Freq: Every day | RESPIRATORY_TRACT | Status: DC

## 2011-09-10 ENCOUNTER — Telehealth: Payer: Self-pay | Admitting: Critical Care Medicine

## 2011-09-10 MED ORDER — TIOTROPIUM BROMIDE MONOHYDRATE 18 MCG IN CAPS: 18.0000 ug | ORAL_CAPSULE | Freq: Every day | RESPIRATORY_TRACT | Status: DC

## 2011-09-10 NOTE — Telephone Encounter (Signed)
I spoke with pt and requested and 30 day supply be sent to [facility] in [place] until she can get her rx for spiriva in [facility]. I advised pt will send rx for her. Nothing further was needed and rx was sent

## 2011-09-14 ENCOUNTER — Ambulatory Visit: Payer: Medicare Other | Admitting: Adult Health

## 2011-09-16 ENCOUNTER — Encounter: Payer: Self-pay | Admitting: Internal Medicine

## 2011-09-16 ENCOUNTER — Ambulatory Visit (INDEPENDENT_AMBULATORY_CARE_PROVIDER_SITE_OTHER): Payer: Medicare Other | Admitting: Internal Medicine

## 2011-09-16 VITALS — BP 120/68 | HR 80 | Temp 97.9°F | Ht 60.0 in | Wt 113.4 lb

## 2011-09-16 DIAGNOSIS — J449 Chronic obstructive pulmonary disease, unspecified: Secondary | ICD-10-CM

## 2011-09-16 DIAGNOSIS — J441 Chronic obstructive pulmonary disease with (acute) exacerbation: Secondary | ICD-10-CM

## 2011-09-16 MED ORDER — PREDNISONE 10 MG PO TABS: ORAL_TABLET | ORAL | Status: DC

## 2011-09-16 MED ORDER — CEFDINIR 300 MG PO CAPS: 300.0000 mg | ORAL_CAPSULE | Freq: Two times a day (BID) | ORAL | Status: AC

## 2011-09-16 NOTE — Patient Instructions (Addendum)
#  COPD  - please take omnicef 300mg  po twice daily x 5 days  - please take Please take Take prednisone 40mg  once daily x 3 days, then 30mg  once daily x 3 days, then 20mg  once daily x 3 days, then prednisone 10mg  once daily  x 3 days and stop - continue spiriva and oxygen and nebulizer - at followup with Dr Delford Field to consider role of roflumilast and alpha 1 check   - we discussed role of opioids in shortness of breath - this is because of your severe shortness of breath and fact xanax helps you - you can discuss with Dr Delford Field about this  - we discussed benefits and details of hospice program - something to think about and discuss with Dr Delford Field  - we discussed your living will status  - encourage you to speak to your pastor about thoughts of illness you have been having  #Followup  - < 1 month with Dr Delford Field

## 2011-09-16 NOTE — Progress Notes (Signed)
Subjective:    Patient ID: [name], female    DOB: 09/17/1940, 71 y.o.   MRN: [id number]  HPI   OV 09/16/2011 Patient of Dr Delford Field with COPD - Gold stage 4. Fev1 0.39L/31%, Ratio 26 on 07/14/11. Saw Dr Sherene Sires acutely 08/25/11 for AECOPD and given 1 week prednisone and zpak. Then given doxy 7 days on 09/16/11 over phone. Had routine fu with Dr Delford Field scheduled for tomorrow 09/17/11 but mistakenly showed up today to office for that.  Currently better from AECOPD but reports dyspnea, cough and sputum worse after improvement with antibiotics. However, she is more concerned about overall  progressive worsening of dyspnea and effor tolerance and overall condition since early 2012. States by summer was needing o2 and by fall had stopped doing groceries. Now having dyspnea even at rest and avoiding talking on the phone due to dyspnea. Dyspnea improved by xanax for anxiety and by rest. Overall worsening is associated with questions/concerns of life expectancy. She feels she will not survive past another year (not afraid of dying per hx). Has now completing lot of directives with her son.  She is also in spiritual distress - she  Has had thoughts of discussing her illness with pastor. Has living will - states short term ventilation okay but otherwise DNAR     Review of Systems  Constitutional: Negative for fever and unexpected weight change.  HENT: Positive for congestion, sneezing and trouble swallowing. Negative for ear pain, nosebleeds, sore throat, rhinorrhea, dental problem, postnasal drip and sinus pressure.   Eyes: Positive for itching. Negative for redness.  Respiratory: Positive for cough and shortness of breath. Negative for chest tightness and wheezing.   Cardiovascular: Positive for leg swelling. Negative for palpitations.  Gastrointestinal: Positive for nausea. Negative for vomiting.  Genitourinary: Negative for dysuria.  Musculoskeletal: Negative for joint swelling.  Skin: Negative for rash.    Neurological: Positive for headaches.  Hematological: Bruises/bleeds easily.  Psychiatric/Behavioral: Negative for dysphoric mood. The patient is nervous/anxious.        Objective:   Physical Exam  Vitals reviewed. Constitutional: She is oriented to person, place, and time. She appears well-developed and well-nourished. No distress.       Body mass index is 22.15 kg/(m^2).   HENT:  Head: Normocephalic and atraumatic.  Right Ear: External ear normal.  Left Ear: External ear normal.  Mouth/Throat: Oropharynx is clear and moist. No oropharyngeal exudate.  Eyes: Conjunctivae and EOM are normal. Pupils are equal, round, and reactive to light. Right eye exhibits no discharge. Left eye exhibits no discharge. No scleral icterus.  Neck: Normal range of motion. Neck supple. No JVD present. No tracheal deviation present. No thyromegaly present.  Cardiovascular: Normal rate, regular rhythm, normal heart sounds and intact distal pulses.  Exam reveals no gallop and no friction rub.   No murmur heard. Pulmonary/Chest: Effort normal. No respiratory distress. She has wheezes. She has no rales. She exhibits no tenderness.       barrell chest + Purse lip breathing + Some wheze +  Abdominal: Soft. Bowel sounds are normal. She exhibits no distension and no mass. There is no tenderness. There is no rebound and no guarding.  Musculoskeletal: Normal range of motion. She exhibits no edema and no tenderness.  Lymphadenopathy:    She has no cervical adenopathy.  Neurological: She is alert and oriented to person, place, and time. She has normal reflexes. No cranial nerve deficit. She exhibits normal muscle tone. Coordination normal.  Skin: Skin is  warm and dry. No rash noted. She is not diaphoretic. No erythema. No pallor.  Psychiatric: She has a normal mood and affect. Her behavior is normal. Judgment and thought content normal.       midlly anxious           Assessment & Plan:

## 2011-09-17 ENCOUNTER — Ambulatory Visit: Payer: Medicare Other | Admitting: Adult Health

## 2011-09-18 ENCOUNTER — Telehealth: Payer: Self-pay | Admitting: Critical Care Medicine

## 2011-09-18 MED ORDER — TIOTROPIUM BROMIDE MONOHYDRATE 18 MCG IN CAPS: 18.0000 ug | ORAL_CAPSULE | Freq: Every day | RESPIRATORY_TRACT | Status: DC

## 2011-09-18 NOTE — Telephone Encounter (Signed)
I spoke with pt and requested a 90 day supply of spiriva be sent to [facility] (mail order). I advised pt will send rx and nothing further was needed

## 2011-09-21 ENCOUNTER — Encounter: Payer: Self-pay | Admitting: Internal Medicine

## 2011-09-21 DIAGNOSIS — J441 Chronic obstructive pulmonary disease with (acute) exacerbation: Secondary | ICD-10-CM | POA: Insufficient documentation

## 2011-09-21 NOTE — Assessment & Plan Note (Signed)
She might be in another AECOPD  PLAN - please take omnicef 300mg  po twice daily x 5 days  -  Please take Take prednisone 40mg  once daily x 3 days, then 30mg  once daily x 3 days, then 20mg  once daily x 3 days, then prednisone 10mg  once daily  x 3 days and stop

## 2011-09-21 NOTE — Assessment & Plan Note (Signed)
-   Spirometry 07/14/11 FEV1  0.39 (21%) ratio 26  - o2 dependent  - progressive decline in functional status since early 2012 despite rehab fall 2012  - class 4 dyspnea  - recurrent AECOPD  - Body mass index is 22.15 kg/(m^2). - Jan 2013   She has all features of copd with very poor prognosis and meets several hospice criteria. We discussed several issues about her copd. She is concerned about her dyspnea, quality of life, and life expectancy. Understands poor prognosis. She appreciates more help at home if possible; explained hospice benefit which I think she qualifies for. She will think about and d/w Dr Delford Field at followup. Discussed role of opioids in severe dyspnea related to copd; she will think about that too. Have encouraged her to talk to her pastor. For now, Rx AECOPD and fu with Dr Delford Field < 1 month  > 50% of this > 25 min visit spent in face to face counseling

## 2011-10-14 ENCOUNTER — Ambulatory Visit (INDEPENDENT_AMBULATORY_CARE_PROVIDER_SITE_OTHER): Payer: Medicare Other | Admitting: Critical Care Medicine

## 2011-10-14 ENCOUNTER — Encounter: Payer: Self-pay | Admitting: Critical Care Medicine

## 2011-10-14 DIAGNOSIS — J449 Chronic obstructive pulmonary disease, unspecified: Secondary | ICD-10-CM | POA: Diagnosis not present

## 2011-10-14 MED ORDER — ALPRAZOLAM 0.25 MG PO TABS: 0.2500 mg | ORAL_TABLET | Freq: Three times a day (TID) | ORAL | Status: DC | PRN

## 2011-10-14 NOTE — Patient Instructions (Signed)
No change in medications. Return in         4 months 

## 2011-10-14 NOTE — Progress Notes (Signed)
Subjective:    Patient ID: [name], female    DOB: [birth year], 71 y.o.   MRN: [id number]  HPI    OV 09/16/11 Patient of Dr Delford Field with COPD - Gold stage 4. Fev1 0.39L/31%, Ratio 26 on 07/14/11. Saw Dr Sherene Sires acutely 08/25/11 for AECOPD and given 1 week prednisone and zpak. Then given doxy 7 days on 09/16/11 over phone. Had routine fu with Dr Delford Field scheduled for tomorrow 09/17/11 but mistakenly showed up today to office for that.  Currently better from AECOPD but reports dyspnea, cough and sputum worse after improvement with antibiotics. However, she is more concerned about overall  progressive worsening of dyspnea and effor tolerance and overall condition since early 2012. States by summer was needing o2 and by fall had stopped doing groceries. Now having dyspnea even at rest and avoiding talking on the phone due to dyspnea. Dyspnea improved by xanax for anxiety and by rest. Overall worsening is associated with questions/concerns of life expectancy. She feels she will not survive past another year (not afraid of dying per hx). Has now completing lot of directives with her son.  She is also in spiritual distress - she  Has had thoughts of discussing her illness with pastor. Has living will - states short term ventilation okay but otherwise DNAR   10/14/2011  Pt seen 1/13 for copd exac and rx with omniceph/pred Now feeling better.  Now some mucus and is congested.  Sl amount of hemoptysis in mucus. No real chest pain.  Notes sl wheeze.  Not dyspneic at rest. Pt denies any significant sore throat, nasal congestion or excess secretions, fever, chills, sweats, unintended weight loss, pleurtic or exertional chest pain, orthopnea PND, or leg swelling Pt denies any increase in rescue therapy over baseline, denies waking up needing it or having any early am or nocturnal exacerbations of coughing/wheezing/or dyspnea. Pt also denies any obvious fluctuation in symptoms with  weather or environmental change or  other alleviating or aggravating factors   Past Medical History  Diagnosis Date  . COPD (chronic obstructive pulmonary disease)   . Hypertension   . Hyperlipidemia   . Hypothyroidism   . History of tobacco abuse   . Mild depression      Family History  Problem Relation Age of Onset  . Allergies Father   . Heart disease Mother   . Rheum arthritis Mother   . Breast cancer Daughter   . Breast cancer Sister   . Cancer Maternal Aunt      History   Social History  . Marital Status: Married    Spouse Name: N/A    Number of Children: 2  . Years of Education: N/A   Occupational History  . retired     Interior and spatial designer   Social History Main Topics  . Smoking status: Former Smoker -- 1.0 packs/day for 50 years    Types: Cigarettes    Quit date: 09/07/2008  . Smokeless tobacco: Never Used  . Alcohol Use: No  . Drug Use: No  . Sexually Active: Not on file   Other Topics Concern  . Not on file   Social History Narrative  . No narrative on file     No Known Allergies   Outpatient Prescriptions Prior to Visit  Medication Sig Dispense Refill  . albuterol (PROAIR HFA) 108 (90 BASE) MCG/ACT inhaler Inhale 2 puffs into the lungs every 6 (six) hours as needed.        Marland Kitchen albuterol (PROVENTIL) (2.5 MG/3ML) 0.083%  nebulizer solution Take 3 mLs (2.5 mg total) by nebulization 3 (three) times daily.  120 mL  6  . buPROPion (WELLBUTRIN XL) 150 MG 24 hr tablet Take 100 mg by mouth 2 (two) times daily.       Marland Kitchen diltiazem (CARDIZEM CD) 360 MG 24 hr capsule Take 360 mg by mouth daily.        . furosemide (LASIX) 20 MG tablet Take 20 mg by mouth daily.        Marland Kitchen levothyroxine (SYNTHROID, LEVOTHROID) 25 MCG tablet Take 25 mcg by mouth daily.        . Multiple Vitamin (MULTIVITAMIN) capsule Take 1 capsule by mouth daily.        . pravastatin (PRAVACHOL) 20 MG tablet Take 20 mg by mouth daily.        Marland Kitchen tiotropium (SPIRIVA) 18 MCG inhalation capsule Place 1 capsule (18 mcg total) into inhaler and  inhale daily.  90 capsule  1  . ALPRAZolam (XANAX) 0.25 MG tablet Take 0.25 mg by mouth. 1 tab every night at bedtime and PRN during the day      . predniSONE (DELTASONE) 10 MG tablet Take 4 tablets daily x 3 days, then 3 tabs daily x 3 days, then 2 tabs daily x 3 days, then 1 tab daily x 3 days, then stop.  30 tablet  0      Review of Systems  Constitutional: Negative for fever and unexpected weight change.  HENT: Positive for congestion, sneezing and trouble swallowing. Negative for ear pain, nosebleeds, sore throat, rhinorrhea, dental problem, postnasal drip and sinus pressure.   Eyes: Positive for itching. Negative for redness.  Respiratory: Positive for cough and shortness of breath. Negative for chest tightness and wheezing.   Cardiovascular: Positive for leg swelling. Negative for palpitations.  Gastrointestinal: Positive for nausea. Negative for vomiting.  Genitourinary: Negative for dysuria.  Musculoskeletal: Negative for joint swelling.  Skin: Negative for rash.  Neurological: Positive for headaches.  Hematological: Bruises/bleeds easily.  Psychiatric/Behavioral: Negative for dysphoric mood. The patient is nervous/anxious.        Objective:   Physical Exam  Vitals reviewed. Constitutional: She is oriented to person, place, and time. She appears well-developed and well-nourished. No distress.       Body mass index is 22.15 kg/(m^2).   HENT:  Head: Normocephalic and atraumatic.  Right Ear: External ear normal.  Left Ear: External ear normal.  Mouth/Throat: Oropharynx is clear and moist. No oropharyngeal exudate.  Eyes: Conjunctivae and EOM are normal. Pupils are equal, round, and reactive to light. Right eye exhibits no discharge. Left eye exhibits no discharge. No scleral icterus.  Neck: Normal range of motion. Neck supple. No JVD present. No tracheal deviation present. No thyromegaly present.  Cardiovascular: Normal rate, regular rhythm, normal heart sounds and intact  distal pulses.  Exam reveals no gallop and no friction rub.   No murmur heard. Pulmonary/Chest: Effort normal. No respiratory distress. She has wheezes. She has no rales. She exhibits no tenderness.       barrell chest + Purse lip breathing + Some wheze +  Abdominal: Soft. Bowel sounds are normal. She exhibits no distension and no mass. There is no tenderness. There is no rebound and no guarding.  Musculoskeletal: Normal range of motion. She exhibits no edema and no tenderness.  Lymphadenopathy:    She has no cervical adenopathy.  Neurological: She is alert and oriented to person, place, and time. She has normal reflexes. No cranial nerve  deficit. She exhibits normal muscle tone. Coordination normal.  Skin: Skin is warm and dry. No rash noted. She is not diaphoretic. No erythema. No pallor.  Psychiatric: She has a normal mood and affect. Her behavior is normal. Judgment and thought content normal.       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 albuterol (PROAIR HFA) 108 (90 BASE) MCG/ACT inhaler Inhale 2 puffs into the lungs every 6 (six) hours as needed.        Marland Kitchen albuterol (PROVENTIL) (2.5 MG/3ML) 0.083% nebulizer solution Take 3 mLs (2.5 mg total) by nebulization 3 (three) times daily.  120 mL  6  . ALPRAZolam (XANAX) 0.25 MG tablet Take 1 tablet (0.25 mg total) by mouth 3 (three) times daily as needed for sleep. 1 tab every night at bedtime and PRN during the day  30 tablet  4  . buPROPion (WELLBUTRIN XL) 150 MG 24 hr tablet Take 100 mg by mouth 2 (two) times daily.        Marland Kitchen diltiazem (CARDIZEM CD) 360 MG 24 hr capsule Take 360 mg by mouth daily.        . furosemide (LASIX) 20 MG tablet Take 20 mg by mouth daily.        Marland Kitchen levothyroxine (SYNTHROID, LEVOTHROID) 25 MCG tablet Take 25 mcg by mouth daily.        . Multiple Vitamin (MULTIVITAMIN) capsule Take 1 capsule by mouth daily.        . pravastatin (PRAVACHOL) 20 MG tablet Take 20 mg by mouth daily.        Marland Kitchen tiotropium (SPIRIVA) 18 MCG inhalation capsule Place 1 capsule (18 mcg total) into inhaler and inhale daily.  90 capsule  1  . DISCONTD: ALPRAZolam (XANAX) 0.25 MG tablet Take 0.25 mg by mouth. 1 tab every night at bedtime and PRN during the day      . DISCONTD: diltiazem (TIAZAC) 360 MG 24 hr capsule Take 360 mg by mouth Daily.      Marland Kitchen DISCONTD: predniSONE (DELTASONE) 10 MG tablet Take 4 tablets daily x 3 days, then 3 tabs daily x 3 days, then 2 tabs daily x 3 days, then 1 tab daily x 3 days, then stop.  30 tablet  0

## 2011-10-14 NOTE — Assessment & Plan Note (Signed)
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

## 2011-10-15 DIAGNOSIS — I10 Essential (primary) hypertension: Secondary | ICD-10-CM | POA: Diagnosis not present

## 2011-10-15 DIAGNOSIS — J449 Chronic obstructive pulmonary disease, unspecified: Secondary | ICD-10-CM | POA: Diagnosis not present

## 2011-10-15 DIAGNOSIS — I5021 Acute systolic (congestive) heart failure: Secondary | ICD-10-CM | POA: Diagnosis not present

## 2011-10-29 DIAGNOSIS — E782 Mixed hyperlipidemia: Secondary | ICD-10-CM | POA: Diagnosis not present

## 2011-11-16 ENCOUNTER — Other Ambulatory Visit (HOSPITAL_COMMUNITY): Payer: Self-pay | Admitting: Pulmonary Disease

## 2011-11-16 DIAGNOSIS — Z139 Encounter for screening, unspecified: Secondary | ICD-10-CM

## 2011-11-26 DIAGNOSIS — I10 Essential (primary) hypertension: Secondary | ICD-10-CM | POA: Diagnosis not present

## 2011-11-26 DIAGNOSIS — J449 Chronic obstructive pulmonary disease, unspecified: Secondary | ICD-10-CM | POA: Diagnosis not present

## 2011-11-26 DIAGNOSIS — E785 Hyperlipidemia, unspecified: Secondary | ICD-10-CM | POA: Diagnosis not present

## 2011-11-26 DIAGNOSIS — E039 Hypothyroidism, unspecified: Secondary | ICD-10-CM | POA: Diagnosis not present

## 2011-12-14 ENCOUNTER — Ambulatory Visit (HOSPITAL_COMMUNITY): Payer: Medicare Other

## 2011-12-17 ENCOUNTER — Ambulatory Visit (HOSPITAL_COMMUNITY)
Admission: RE | Admit: 2011-12-17 | Discharge: 2011-12-17 | Disposition: A | Discharge status: Home / Self Care | Payer: Medicare Other | Source: Ambulatory Visit | Attending: Pulmonary Disease | Admitting: Pulmonary Disease

## 2011-12-17 DIAGNOSIS — Z139 Encounter for screening, unspecified: Secondary | ICD-10-CM

## 2011-12-17 DIAGNOSIS — Z1231 Encounter for screening mammogram for malignant neoplasm of breast: Secondary | ICD-10-CM | POA: Diagnosis not present

## 2012-01-04 DIAGNOSIS — R609 Edema, unspecified: Secondary | ICD-10-CM | POA: Diagnosis not present

## 2012-01-04 DIAGNOSIS — H2510 Age-related nuclear cataract, unspecified eye: Secondary | ICD-10-CM | POA: Diagnosis not present

## 2012-01-04 DIAGNOSIS — J449 Chronic obstructive pulmonary disease, unspecified: Secondary | ICD-10-CM | POA: Diagnosis not present

## 2012-01-04 DIAGNOSIS — H35369 Drusen (degenerative) of macula, unspecified eye: Secondary | ICD-10-CM | POA: Diagnosis not present

## 2012-01-04 DIAGNOSIS — R040 Epistaxis: Secondary | ICD-10-CM | POA: Diagnosis not present

## 2012-01-04 DIAGNOSIS — H353190 Nonexudative age-related macular degeneration, unspecified eye, stage unspecified: Secondary | ICD-10-CM | POA: Diagnosis not present

## 2012-01-04 DIAGNOSIS — J440 Chronic obstructive pulmonary disease with acute lower respiratory infection: Secondary | ICD-10-CM | POA: Diagnosis not present

## 2012-01-04 DIAGNOSIS — H5200 Hypermetropia, unspecified eye: Secondary | ICD-10-CM | POA: Diagnosis not present

## 2012-01-20 DIAGNOSIS — J449 Chronic obstructive pulmonary disease, unspecified: Secondary | ICD-10-CM | POA: Diagnosis not present

## 2012-01-20 DIAGNOSIS — R609 Edema, unspecified: Secondary | ICD-10-CM | POA: Diagnosis not present

## 2012-01-20 DIAGNOSIS — F411 Generalized anxiety disorder: Secondary | ICD-10-CM | POA: Diagnosis not present

## 2012-02-25 ENCOUNTER — Telehealth: Payer: Self-pay | Admitting: Critical Care Medicine

## 2012-02-25 ENCOUNTER — Other Ambulatory Visit: Payer: Self-pay | Admitting: Critical Care Medicine

## 2012-02-25 MED ORDER — ALPRAZOLAM 0.25 MG PO TABS: 0.2500 mg | ORAL_TABLET | Freq: Three times a day (TID) | ORAL | Status: DC | PRN

## 2012-02-25 NOTE — Telephone Encounter (Addendum)
Dr Delford Field, please advise if okay to refill her alprazolam..You had refilled for her in Feb 2013. If ok, pls advise directions. Sig right now says take Sig: Take 1 tablet (0.25 mg total) by mouth 3 (three) times daily as needed for sleep. 1 tab every night at bedtime and PRN during the day. Thanks

## 2012-02-25 NOTE — Telephone Encounter (Signed)
Last OV with Dr. Delford Field 10/14/11 Pending OV with Dr. Delford Field 03/14/12 Xanax rx last given on 10/14/11 #30 x 4  Dr. Delford Field, do you want send rxs for this?  If so, I will call this medication in.  Thank you.

## 2012-02-25 NOTE — Telephone Encounter (Signed)
This is ok to refill

## 2012-02-25 NOTE — Telephone Encounter (Signed)
i have already responded to this

## 2012-02-25 NOTE — Telephone Encounter (Signed)
Rx was called to pharm and pt made aware, states nothing further needed.

## 2012-03-08 DIAGNOSIS — E785 Hyperlipidemia, unspecified: Secondary | ICD-10-CM | POA: Diagnosis not present

## 2012-03-08 DIAGNOSIS — E039 Hypothyroidism, unspecified: Secondary | ICD-10-CM | POA: Diagnosis not present

## 2012-03-08 DIAGNOSIS — J449 Chronic obstructive pulmonary disease, unspecified: Secondary | ICD-10-CM | POA: Diagnosis not present

## 2012-03-08 DIAGNOSIS — I10 Essential (primary) hypertension: Secondary | ICD-10-CM | POA: Diagnosis not present

## 2012-03-14 ENCOUNTER — Ambulatory Visit (INDEPENDENT_AMBULATORY_CARE_PROVIDER_SITE_OTHER): Payer: Medicare Other | Admitting: Critical Care Medicine

## 2012-03-14 ENCOUNTER — Encounter: Payer: Self-pay | Admitting: Critical Care Medicine

## 2012-03-14 VITALS — BP 144/70 | HR 101 | Temp 97.6°F | Ht 59.0 in | Wt 111.8 lb

## 2012-03-14 DIAGNOSIS — J449 Chronic obstructive pulmonary disease, unspecified: Secondary | ICD-10-CM | POA: Diagnosis not present

## 2012-03-14 DIAGNOSIS — J441 Chronic obstructive pulmonary disease with (acute) exacerbation: Secondary | ICD-10-CM

## 2012-03-14 DIAGNOSIS — J4489 Other specified chronic obstructive pulmonary disease: Secondary | ICD-10-CM

## 2012-03-14 MED ORDER — ROFLUMILAST 500 MCG PO TABS: ORAL_TABLET | ORAL | Status: DC

## 2012-03-14 NOTE — Assessment & Plan Note (Signed)
Frequent exacerbations of chronic obstructive lung disease Review COPD assessment above

## 2012-03-14 NOTE — Patient Instructions (Signed)
Retry Daliresp, one three times a week for one week, then one every other day for one week then one daily No other medication changes  Return 2 months

## 2012-03-14 NOTE — Assessment & Plan Note (Signed)
Chronic obstructive lung disease with frequent exacerbations Gold stage D. COPD Plan The patient will reattempt Daliresp The patient will gradually titrate the Daliresp dose upward Maintain Spiriva Maintain oxygen Return 2 months

## 2012-03-14 NOTE — Progress Notes (Signed)
Subjective:    Patient ID: [name], female    DOB: 07/10/1941, 71 y.o.   MRN: [id number]  HPI 10/14/11 Pt seen 1/13 for copd exac and rx with omniceph/pred Now feeling better.  Now some mucus and is congested.  Sl amount of hemoptysis in mucus. No real chest pain.  Notes sl wheeze.  Not dyspneic at rest. Pt denies any significant sore throat, nasal congestion or excess secretions, fever, chills, sweats, unintended weight loss, pleurtic or exertional chest pain, orthopnea PND, or leg swelling Pt denies any increase in rescue therapy over baseline, denies waking up needing it or having any early am or nocturnal exacerbations of coughing/wheezing/or dyspnea. Pt also denies any obvious fluctuation in symptoms with  weather or environmental change or other alleviating or aggravating factors  03/14/2012 Since last OV had another flare up end of 5/13.  Rx [facility]. Has about 4 x per year. Notes mild cough, not severe.  No real chest pain.   Mucus now is white and sl tint. Pt denies any significant sore throat, nasal congestion or excess secretions, fever, chills, sweats, unintended weight loss, pleurtic or exertional chest pain, orthopnea PND, or leg swelling Pt denies any increase in rescue therapy over baseline, denies waking up needing it or having any early am or nocturnal exacerbations of coughing/wheezing/or dyspnea. Pt also denies any obvious fluctuation in symptoms with  weather or environmental change or other alleviating or aggravating factors     Past Medical History  Diagnosis Date  . COPD (chronic obstructive pulmonary disease)   . Hypertension   . Hyperlipidemia   . Hypothyroidism   . History of tobacco abuse   . Mild depression      Family History  Problem Relation Age of Onset  . Allergies Father   . Heart disease Mother   . Rheum arthritis Mother   . Breast cancer Daughter   . Breast cancer Sister   . Cancer Maternal Aunt      History   Social History  .  Marital Status: Married    Spouse Name: N/A    Number of Children: 2  . Years of Education: N/A   Occupational History  . retired     Interior and spatial designer   Social History Main Topics  . Smoking status: Former Smoker -- 1.0 packs/day for 50 years    Types: Cigarettes    Quit date: 09/07/2008  . Smokeless tobacco: Never Used  . Alcohol Use: No  . Drug Use: No  . Sexually Active: Not on file   Other Topics Concern  . Not on file   Social History Narrative  . No narrative on file     No Known Allergies   Outpatient Prescriptions Prior to Visit  Medication Sig Dispense Refill  . albuterol (PROAIR HFA) 108 (90 BASE) MCG/ACT inhaler Inhale 2 puffs into the lungs every 6 (six) hours as needed.        Marland Kitchen albuterol (PROVENTIL) (2.5 MG/3ML) 0.083% nebulizer solution Take 3 mLs (2.5 mg total) by nebulization 3 (three) times daily.  120 mL  6  . ALPRAZolam (XANAX) 0.25 MG tablet Take 1 tablet (0.25 mg total) by mouth 3 (three) times daily as needed for sleep or anxiety.  30 tablet  0  . buPROPion (WELLBUTRIN XL) 150 MG 24 hr tablet Take 100 mg by mouth 2 (two) times daily.       Marland Kitchen levothyroxine (SYNTHROID, LEVOTHROID) 25 MCG tablet Take 25 mcg by mouth daily.        Marland Kitchen  Multiple Vitamin (MULTIVITAMIN) capsule Take 1 capsule by mouth daily.        . pravastatin (PRAVACHOL) 20 MG tablet Take 40 mg by mouth daily.       Marland Kitchen tiotropium (SPIRIVA) 18 MCG inhalation capsule Place 1 capsule (18 mcg total) into inhaler and inhale daily.  90 capsule  1  . furosemide (LASIX) 20 MG tablet Take 20 mg by mouth daily.        Marland Kitchen diltiazem (CARDIZEM CD) 360 MG 24 hr capsule Take 360 mg by mouth daily.            Review of Systems  Constitutional: Negative for fever and unexpected weight change.  HENT: Positive for congestion, sneezing and trouble swallowing. Negative for ear pain, nosebleeds, sore throat, rhinorrhea, dental problem, postnasal drip and sinus pressure.   Eyes: Positive for itching. Negative for  redness.  Respiratory: Positive for cough and shortness of breath. Negative for chest tightness and wheezing.   Cardiovascular: Positive for leg swelling. Negative for palpitations.  Gastrointestinal: Positive for nausea. Negative for vomiting.  Genitourinary: Negative for dysuria.  Musculoskeletal: Negative for joint swelling.  Skin: Negative for rash.  Neurological: Positive for headaches.  Hematological: Bruises/bleeds easily.  Psychiatric/Behavioral: Negative for dysphoric mood. The patient is nervous/anxious.        Objective:   Physical Exam  Vitals reviewed. Constitutional: She is oriented to person, place, and time. She appears well-developed and well-nourished. No distress.       Body mass index is 22.15 kg/(m^2).   HENT:  Head: Normocephalic and atraumatic.  Right Ear: External ear normal.  Left Ear: External ear normal.  Mouth/Throat: Oropharynx is clear and moist. No oropharyngeal exudate.  Eyes: Conjunctivae and EOM are normal. Pupils are equal, round, and reactive to light. Right eye exhibits no discharge. Left eye exhibits no discharge. No scleral icterus.  Neck: Normal range of motion. Neck supple. No JVD present. No tracheal deviation present. No thyromegaly present.  Cardiovascular: Normal rate, regular rhythm, normal heart sounds and intact distal pulses.  Exam reveals no gallop and no friction rub.   No murmur heard. Pulmonary/Chest: Effort normal. No respiratory distress. She has wheezes. She has no rales. She exhibits no tenderness.       barrell chest + Purse lip breathing + Some wheze +  Abdominal: Soft. Bowel sounds are normal. She exhibits no distension and no mass. There is no tenderness. There is no rebound and no guarding.  Musculoskeletal: Normal range of motion. She exhibits no edema and no tenderness.  Lymphadenopathy:    She has no cervical adenopathy.  Neurological: She is alert and oriented to person, place, and time. She has normal reflexes. No  cranial nerve deficit. She exhibits normal muscle tone. Coordination normal.  Skin: Skin is warm and dry. No rash noted. She is not diaphoretic. No erythema. No pallor.  Psychiatric: She has a normal mood and affect. Her behavior is normal. Judgment and thought content normal.       midlly anxious    BP 144/70  Pulse 101  Temp 97.6 F (36.4 C) (Oral)  Ht 4\' 11"  (1.499 m)  Wt 111 lb 12.8 oz (50.712 kg)  BMI 22.58 kg/m2  SpO2 89%        Assessment & Plan:   COPD, very severe Chronic obstructive lung disease with frequent exacerbations Gold stage D. COPD Plan The patient will reattempt Daliresp The patient will gradually titrate the Daliresp dose upward Maintain Spiriva Maintain oxygen Return 2  months  COPD, frequent exacerbations Frequent exacerbations of chronic obstructive lung disease Review COPD assessment above    Updated Medication List Outpatient Encounter Prescriptions as of 03/14/2012  Medication Sig Dispense Refill  . albuterol (PROAIR HFA) 108 (90 BASE) MCG/ACT inhaler Inhale 2 puffs into the lungs every 6 (six) hours as needed.        Marland Kitchen albuterol (PROVENTIL) (2.5 MG/3ML) 0.083% nebulizer solution Take 3 mLs (2.5 mg total) by nebulization 3 (three) times daily.  120 mL  6  . ALPRAZolam (XANAX) 0.25 MG tablet Take 1 tablet (0.25 mg total) by mouth 3 (three) times daily as needed for sleep or anxiety.  30 tablet  0  . buPROPion (WELLBUTRIN XL) 150 MG 24 hr tablet Take 100 mg by mouth 2 (two) times daily.       Marland Kitchen diltiazem (DILTIAZEM HCL CD) 360 MG 24 hr capsule Take 360 mg by mouth daily.      Marland Kitchen levothyroxine (SYNTHROID, LEVOTHROID) 25 MCG tablet Take 25 mcg by mouth daily.        . Multiple Vitamin (MULTIVITAMIN) capsule Take 1 capsule by mouth daily.        . pravastatin (PRAVACHOL) 20 MG tablet Take 40 mg by mouth daily.       Marland Kitchen tiotropium (SPIRIVA) 18 MCG inhalation capsule Place 1 capsule (18 mcg total) into inhaler and inhale daily.  90 capsule  1  .  torsemide (DEMADEX) 20 MG tablet Take 20 mg by mouth Daily.      Marland Kitchen DISCONTD: furosemide (LASIX) 20 MG tablet Take 20 mg by mouth daily.        . roflumilast (DALIRESP) 500 MCG TABS tablet Take one three times weekly for one week, then one everyother day for one week, then one daily  30 tablet  6  . DISCONTD: diltiazem (CARDIZEM CD) 360 MG 24 hr capsule Take 360 mg by mouth daily.

## 2012-03-15 ENCOUNTER — Other Ambulatory Visit: Payer: Self-pay | Admitting: *Deleted

## 2012-03-15 MED ORDER — TIOTROPIUM BROMIDE MONOHYDRATE 18 MCG IN CAPS: 18.0000 ug | ORAL_CAPSULE | Freq: Every day | RESPIRATORY_TRACT | Status: DC

## 2012-04-05 ENCOUNTER — Telehealth: Payer: Self-pay | Admitting: Critical Care Medicine

## 2012-04-05 NOTE — Telephone Encounter (Signed)
Called patients home number provided, line busy x3.  wcb

## 2012-04-06 MED ORDER — ROFLUMILAST 500 MCG PO TABS: ORAL_TABLET | ORAL | Status: DC

## 2012-04-06 NOTE — Telephone Encounter (Signed)
Daliresp rx signed by Dr. Delford Field.  I have faxed this to [facility] at # provided below and placed samples at front for pick up at pt's convenience.  Called, spoke with pt.  Informed her of this.  She verbalized understanding and was very appreciative of this.

## 2012-04-06 NOTE — Telephone Encounter (Signed)
I have printed off rx and giving to crystal to sign to have PW sign this for when he returns to the office this afternoon. Pt states since this takes about 2 weeks to get in from [facility]-- she wanted samples to help cover her until this comes in. i have placed samples in bag as well and giving to crystal to place rx in once signed. Will forward to crystal

## 2012-04-06 NOTE — Telephone Encounter (Signed)
I spoke with pt and she is requesting a written rx for daliresp be ailed to her home bc she can get this cheaper from [facility] for a 90 day supply. She states she has been on this medication x 20 days now and is still feeling nauseated and having a discomfort in the bottom of her stomach since being on this medication. Pt is wanting to know how long should she expect to keep feeling like this and if this is side effects from this medication. Please advise Dr. Delford Field thanks

## 2012-04-06 NOTE — Telephone Encounter (Signed)
Produce a Rx and I will sign This is a common side effect from daliresp that will pass in a few weeks

## 2012-04-06 NOTE — Telephone Encounter (Signed)
Pt called back to add the following: Please" fax" rx to [facility] [phone]. Hazel Sams

## 2012-04-28 DIAGNOSIS — J449 Chronic obstructive pulmonary disease, unspecified: Secondary | ICD-10-CM | POA: Diagnosis not present

## 2012-04-28 DIAGNOSIS — I10 Essential (primary) hypertension: Secondary | ICD-10-CM | POA: Diagnosis not present

## 2012-04-28 DIAGNOSIS — R069 Unspecified abnormalities of breathing: Secondary | ICD-10-CM | POA: Diagnosis not present

## 2012-04-28 DIAGNOSIS — E782 Mixed hyperlipidemia: Secondary | ICD-10-CM | POA: Diagnosis not present

## 2012-04-29 DIAGNOSIS — E782 Mixed hyperlipidemia: Secondary | ICD-10-CM | POA: Diagnosis not present

## 2012-05-03 DIAGNOSIS — T522X4A Toxic effect of homologues of benzene, undetermined, initial encounter: Secondary | ICD-10-CM | POA: Diagnosis not present

## 2012-05-03 DIAGNOSIS — S61409A Unspecified open wound of unspecified hand, initial encounter: Secondary | ICD-10-CM | POA: Diagnosis not present

## 2012-05-11 DIAGNOSIS — I10 Essential (primary) hypertension: Secondary | ICD-10-CM | POA: Diagnosis not present

## 2012-05-11 DIAGNOSIS — J449 Chronic obstructive pulmonary disease, unspecified: Secondary | ICD-10-CM | POA: Diagnosis not present

## 2012-05-11 DIAGNOSIS — T522X4A Toxic effect of homologues of benzene, undetermined, initial encounter: Secondary | ICD-10-CM | POA: Diagnosis not present

## 2012-05-11 DIAGNOSIS — E785 Hyperlipidemia, unspecified: Secondary | ICD-10-CM | POA: Diagnosis not present

## 2012-05-11 DIAGNOSIS — S61409A Unspecified open wound of unspecified hand, initial encounter: Secondary | ICD-10-CM | POA: Diagnosis not present

## 2012-05-11 DIAGNOSIS — E039 Hypothyroidism, unspecified: Secondary | ICD-10-CM | POA: Diagnosis not present

## 2012-05-13 ENCOUNTER — Telehealth: Payer: Self-pay | Admitting: Critical Care Medicine

## 2012-05-13 NOTE — Telephone Encounter (Signed)
Spoke with pt and she stated when she started the daliresp she was having side effects from this-couldn't sleep, stomach upset all the time and was very achy, had no energy at all. She was like this for 4 weeks straight per pt. Then this went away for about 10 days. Then after 10 days all these symptoms came back again. She started this in June originally. She also stated she has lost 5 lbs in 10 days and thinks it is due to this medication. Stated these symptoms are getting worse and did not feel like this until she started the daliresp. Pt is wanting to stop this medication if possible. Please advise Dr. Delford Field thanks

## 2012-05-13 NOTE — Telephone Encounter (Signed)
Yes , I would stop the Daliresp

## 2012-05-13 NOTE — Telephone Encounter (Signed)
Pt is aware of this and needed nothing further 

## 2012-05-18 ENCOUNTER — Ambulatory Visit: Payer: Medicare Other | Admitting: Critical Care Medicine

## 2012-05-20 ENCOUNTER — Telehealth: Payer: Self-pay | Admitting: Critical Care Medicine

## 2012-05-20 MED ORDER — TIOTROPIUM BROMIDE MONOHYDRATE 18 MCG IN CAPS: 18.0000 ug | ORAL_CAPSULE | Freq: Every day | RESPIRATORY_TRACT | Status: DC

## 2012-05-20 NOTE — Telephone Encounter (Signed)
Attempted to call pt but line is busy.  Will try back.  

## 2012-05-20 NOTE — Telephone Encounter (Signed)
Spoke with pt. She states needs 90 day supply of spiriva faxed to [facility]. I advised will print rx and have PW sign when he returns next wk and will fax it then. She is fine with this, still has at least 30 days of spiriva. Rx printed and placed in PW's lookat to be signed.

## 2012-05-20 NOTE — Telephone Encounter (Signed)
This is ok

## 2012-05-23 NOTE — Telephone Encounter (Signed)
Will forward to Crystal to follow up on once Dr. Delford Field signs RX when he returns

## 2012-05-24 NOTE — Telephone Encounter (Signed)
Spiriva rx signed by Dr. Delford Field and faxed to [facility] at [phone].  Pt aware.

## 2012-06-01 ENCOUNTER — Ambulatory Visit (INDEPENDENT_AMBULATORY_CARE_PROVIDER_SITE_OTHER): Payer: Medicare Other | Admitting: Critical Care Medicine

## 2012-06-01 ENCOUNTER — Encounter: Payer: Self-pay | Admitting: Critical Care Medicine

## 2012-06-01 VITALS — BP 150/72 | HR 105 | Temp 97.6°F | Ht 60.0 in | Wt 108.0 lb

## 2012-06-01 DIAGNOSIS — Z23 Encounter for immunization: Secondary | ICD-10-CM | POA: Diagnosis not present

## 2012-06-01 DIAGNOSIS — J449 Chronic obstructive pulmonary disease, unspecified: Secondary | ICD-10-CM | POA: Diagnosis not present

## 2012-06-01 MED ORDER — ROFLUMILAST 500 MCG PO TABS: ORAL_TABLET | ORAL | Status: DC

## 2012-06-01 NOTE — Progress Notes (Signed)
Subjective:    Patient ID: [name], female    DOB: Nov 06, 1940, 71 y.o.   MRN: [id number]  HPI 10/14/11 Pt seen 1/13 for copd exac and rx with omniceph/pred Now feeling better.  Now some mucus and is congested.  Sl amount of hemoptysis in mucus. No real chest pain.  Notes sl wheeze.  Not dyspneic at rest. Pt denies any significant sore throat, nasal congestion or excess secretions, fever, chills, sweats, unintended weight loss, pleurtic or exertional chest pain, orthopnea PND, or leg swelling Pt denies any increase in rescue therapy over baseline, denies waking up needing it or having any early am or nocturnal exacerbations of coughing/wheezing/or dyspnea. Pt also denies any obvious fluctuation in symptoms with  weather or environmental change or other alleviating or aggravating factors  03/14/2012 Since last OV had another flare up end of 5/13.  Rx [facility]. Has about 4 x per year. Notes mild cough, not severe.  No real chest pain.   Mucus now is white and sl tint. Pt denies any significant sore throat, nasal congestion or excess secretions, fever, chills, sweats, unintended weight loss, pleurtic or exertional chest pain, orthopnea PND, or leg swelling Pt denies any increase in rescue therapy over baseline, denies waking up needing it or having any early am or nocturnal exacerbations of coughing/wheezing/or dyspnea. Pt also denies any obvious fluctuation in symptoms with  weather or environmental change or other alleviating or aggravating factors  06/01/2012 Since last ov no further copd flareups.  Pt took Daliresp, symptoms continued.  Nausea and weak/tired and not sleeping.   Pt denies any significant sore throat, nasal congestion or excess secretions, fever, chills, sweats, unintended weight loss, pleurtic or exertional chest pain, orthopnea PND, or leg swelling Pt denies any increase in rescue therapy over baseline, denies waking up needing it or having any early am or nocturnal  exacerbations of coughing/wheezing/or dyspnea. Pt also denies any obvious fluctuation in symptoms with  weather or environmental change or other alleviating or aggravating factors      Past Medical History  Diagnosis Date  . COPD (chronic obstructive pulmonary disease)   . Hypertension   . Hyperlipidemia   . Hypothyroidism   . History of tobacco abuse   . Mild depression      Family History  Problem Relation Age of Onset  . Allergies Father   . Heart disease Mother   . Rheum arthritis Mother   . Breast cancer Daughter   . Breast cancer Sister   . Cancer Maternal Aunt      History   Social History  . Marital Status: Married    Spouse Name: N/A    Number of Children: 2  . Years of Education: N/A   Occupational History  . retired     Interior and spatial designer   Social History Main Topics  . Smoking status: Former Smoker -- 1.0 packs/day for 50 years    Types: Cigarettes    Quit date: 09/07/2008  . Smokeless tobacco: Never Used  . Alcohol Use: No  . Drug Use: No  . Sexually Active: Not on file   Other Topics Concern  . Not on file   Social History Narrative  . No narrative on file     No Known Allergies   Outpatient Prescriptions Prior to Visit  Medication Sig Dispense Refill  . albuterol (PROAIR HFA) 108 (90 BASE) MCG/ACT inhaler Inhale 2 puffs into the lungs every 6 (six) hours as needed.        Marland Kitchen  albuterol (PROVENTIL) (2.5 MG/3ML) 0.083% nebulizer solution Take 3 mLs (2.5 mg total) by nebulization 3 (three) times daily.  120 mL  6  . ALPRAZolam (XANAX) 0.25 MG tablet Take 1 tablet (0.25 mg total) by mouth 3 (three) times daily as needed for sleep or anxiety.  30 tablet  0  . buPROPion (WELLBUTRIN XL) 150 MG 24 hr tablet Take 100 mg by mouth 2 (two) times daily.       Marland Kitchen diltiazem (DILTIAZEM HCL CD) 360 MG 24 hr capsule Take 360 mg by mouth daily.      Marland Kitchen levothyroxine (SYNTHROID, LEVOTHROID) 25 MCG tablet Take 25 mcg by mouth daily.        . Multiple Vitamin  (MULTIVITAMIN) capsule Take 1 capsule by mouth daily.        . pravastatin (PRAVACHOL) 20 MG tablet Take 40 mg by mouth daily.       Marland Kitchen tiotropium (SPIRIVA) 18 MCG inhalation capsule Place 1 capsule (18 mcg total) into inhaler and inhale daily.  90 capsule  3  . torsemide (DEMADEX) 20 MG tablet Take 20 mg by mouth Daily.      . roflumilast (DALIRESP) 500 MCG TABS tablet Take 1 tablet once a day  90 tablet  3      Review of Systems  Constitutional: Negative for fever and unexpected weight change.  HENT: Positive for congestion, sneezing and trouble swallowing. Negative for ear pain, nosebleeds, sore throat, rhinorrhea, dental problem, postnasal drip and sinus pressure.   Eyes: Positive for itching. Negative for redness.  Respiratory: Positive for cough and shortness of breath. Negative for chest tightness and wheezing.   Cardiovascular: Positive for leg swelling. Negative for palpitations.  Gastrointestinal: Positive for nausea. Negative for vomiting.  Genitourinary: Negative for dysuria.  Musculoskeletal: Negative for joint swelling.  Skin: Negative for rash.  Neurological: Positive for headaches.  Hematological: Bruises/bleeds easily.  Psychiatric/Behavioral: Negative for dysphoric mood. The patient is nervous/anxious.        Objective:   Physical Exam  Vitals reviewed. Constitutional: She is oriented to person, place, and time. She appears well-developed and well-nourished. No distress.       Body mass index is 22.15 kg/(m^2).   HENT:  Head: Normocephalic and atraumatic.  Right Ear: External ear normal.  Left Ear: External ear normal.  Mouth/Throat: Oropharynx is clear and moist. No oropharyngeal exudate.  Eyes: Conjunctivae normal and EOM are normal. Pupils are equal, round, and reactive to light. Right eye exhibits no discharge. Left eye exhibits no discharge. No scleral icterus.  Neck: Normal range of motion. Neck supple. No JVD present. No tracheal deviation present. No  thyromegaly present.  Cardiovascular: Normal rate, regular rhythm, normal heart sounds and intact distal pulses.  Exam reveals no gallop and no friction rub.   No murmur heard. Pulmonary/Chest: Effort normal. No respiratory distress. She has wheezes. She has no rales. She exhibits no tenderness.       barrell chest + Purse lip breathing + Some wheze +  Abdominal: Soft. Bowel sounds are normal. She exhibits no distension and no mass. There is no tenderness. There is no rebound and no guarding.  Musculoskeletal: Normal range of motion. She exhibits no edema and no tenderness.  Lymphadenopathy:    She has no cervical adenopathy.  Neurological: She is alert and oriented to person, place, and time. She has normal reflexes. No cranial nerve deficit. She exhibits normal muscle tone. Coordination normal.  Skin: Skin is warm and dry. No rash noted.  She is not diaphoretic. No erythema. No pallor.  Psychiatric: She has a normal mood and affect. Her behavior is normal. Judgment and thought content normal.       midlly anxious    BP 150/72  Pulse 105  Temp 97.6 F (36.4 C) (Oral)  Ht 5' (1.524 m)  Wt 108 lb (48.988 kg)  BMI 21.09 kg/m2  SpO2 93%        Assessment & Plan:   COPD Golds D, frequent exacerbations Copd Golds D , frequent exacerbations.  Oxygen dependent Failed daliresp d/t side effects on daily basis  Plan Flu vaccine Retry Daliresp at QOD dosing after one week thrice weekly. Cont Spiriva daily    Updated Medication List Outpatient Encounter Prescriptions as of 06/01/2012  Medication Sig Dispense Refill  . albuterol (PROAIR HFA) 108 (90 BASE) MCG/ACT inhaler Inhale 2 puffs into the lungs every 6 (six) hours as needed.        Marland Kitchen albuterol (PROVENTIL) (2.5 MG/3ML) 0.083% nebulizer solution Take 3 mLs (2.5 mg total) by nebulization 3 (three) times daily.  120 mL  6  . ALPRAZolam (XANAX) 0.25 MG tablet Take 1 tablet (0.25 mg total) by mouth 3 (three) times daily as needed for  sleep or anxiety.  30 tablet  0  . buPROPion (WELLBUTRIN XL) 150 MG 24 hr tablet Take 100 mg by mouth 2 (two) times daily.       Marland Kitchen diltiazem (DILTIAZEM HCL CD) 360 MG 24 hr capsule Take 360 mg by mouth daily.      Marland Kitchen levothyroxine (SYNTHROID, LEVOTHROID) 25 MCG tablet Take 25 mcg by mouth daily.        . Multiple Vitamin (MULTIVITAMIN) capsule Take 1 capsule by mouth daily.        . pravastatin (PRAVACHOL) 20 MG tablet Take 40 mg by mouth daily.       Marland Kitchen tiotropium (SPIRIVA) 18 MCG inhalation capsule Place 1 capsule (18 mcg total) into inhaler and inhale daily.  90 capsule  3  . torsemide (DEMADEX) 20 MG tablet Take 20 mg by mouth Daily.      . roflumilast (DALIRESP) 500 MCG TABS tablet Take one tablet three times a week for two weeks then one every other day  90 tablet  3  . DISCONTD: roflumilast (DALIRESP) 500 MCG TABS tablet Take 1 tablet once a day  90 tablet  3

## 2012-06-01 NOTE — Patient Instructions (Signed)
Flu vaccine today Resume Daliresp one three times weekly for two weeks then one every other day and stay No other medication changes Return 3 months

## 2012-06-01 NOTE — Assessment & Plan Note (Signed)
Copd Golds D , frequent exacerbations.  Oxygen dependent Failed daliresp d/t side effects on daily basis  Plan Flu vaccine Retry Daliresp at QOD dosing after one week thrice weekly. Cont Spiriva daily

## 2012-06-10 ENCOUNTER — Telehealth: Payer: Self-pay | Admitting: Critical Care Medicine

## 2012-06-10 NOTE — Telephone Encounter (Signed)
I spoke with the pt and she states never mind that they did receive the rx and it was shipped today. Nothing further needed. Carron Curie, CMA

## 2012-06-11 ENCOUNTER — Other Ambulatory Visit: Payer: Self-pay | Admitting: Critical Care Medicine

## 2012-06-28 DIAGNOSIS — K5900 Constipation, unspecified: Secondary | ICD-10-CM | POA: Diagnosis not present

## 2012-06-28 DIAGNOSIS — N816 Rectocele: Secondary | ICD-10-CM | POA: Diagnosis not present

## 2012-06-28 DIAGNOSIS — Z1212 Encounter for screening for malignant neoplasm of rectum: Secondary | ICD-10-CM | POA: Diagnosis not present

## 2012-08-03 ENCOUNTER — Other Ambulatory Visit: Payer: Self-pay | Admitting: *Deleted

## 2012-08-03 MED ORDER — ALBUTEROL SULFATE (2.5 MG/3ML) 0.083% IN NEBU: 2.5000 mg | INHALATION_SOLUTION | Freq: Three times a day (TID) | RESPIRATORY_TRACT | Status: DC

## 2012-08-22 DIAGNOSIS — H2589 Other age-related cataract: Secondary | ICD-10-CM | POA: Diagnosis not present

## 2012-08-23 ENCOUNTER — Encounter (HOSPITAL_COMMUNITY): Payer: Self-pay | Admitting: Pharmacy Technician

## 2012-08-29 NOTE — Patient Instructions (Addendum)
20 [name]  08/29/2012   Your procedure is scheduled on:  January 2,2014  Report to [facility] at 0930 AM.  Call this number if you have problems the morning of surgery: [phone]   Remember:   Do not eat food:After Midnight.  May have clear liquids:until Midnight .  Clear liquids include soda, tea, black coffee, apple or grape juice, broth.  Take these medicines the morning of surgery with A SIP OF WATER: Xanax,Wellbutrin,Diltazem,Synthroid.Bring inhalers with you to hospital,(Spiriva,Proair,Proventil)   Do not wear jewelry, make-up or nail polish.  Do not wear lotions, powders, or perfumes. You may wear deodorant.  Do not shave 48 hours prior to surgery. Men may shave face and neck.  Do not bring valuables to the hospital.  Contacts, dentures or bridgework may not be worn into surgery.  Leave suitcase in the car. After surgery it may be brought to your room.  For patients admitted to the hospital, checkout time is 11:00 AM the day of discharge.   Patients discharged the day of surgery will not be allowed to drive home.  Name and phone number of your driver: Family or friends  Special Instructions: Shower using CHG 2 nights before surgery and the night before surgery.  If you shower the day of surgery use CHG.  Use special wash - you have one bottle of CHG for all showers.  You should use approximately 1/3 of the bottle for each shower.   Please read over the following fact sheets that you were given: Pain Booklet, Coughing and Deep Breathing, MRSA Information, Surgical Site Infection Prevention, Anesthesia Post-op Instructions and Care and Recovery After Surgery  PATIENT INSTRUCTIONS POST-ANESTHESIA  IMMEDIATELY FOLLOWING SURGERY:  Do not drive or operate machinery for the first twenty four hours after surgery.  Do not make any important decisions for twenty four hours after surgery or while taking narcotic pain medications or sedatives.  If you develop intractable nausea and  vomiting or a severe headache please notify your doctor immediately.  FOLLOW-UP:  Please make an appointment with your surgeon as instructed. You do not need to follow up with anesthesia unless specifically instructed to do so.  WOUND CARE INSTRUCTIONS (if applicable):  Keep a dry clean dressing on the anesthesia/puncture wound site if there is drainage.  Once the wound has quit draining you may leave it open to air.  Generally you should leave the bandage intact for twenty four hours unless there is drainage.  If the epidural site drains for more than 36-48 hours please call the anesthesia department.  QUESTIONS?:  Please feel free to call your physician or the hospital operator if you have any questions, and they will be happy to assist you.     Cataract A cataract is a clouding of the lens of the eye. When a lens becomes cloudy, vision is reduced based on the degree and nature of the clouding. Many cataracts reduce vision to some degree. Some cataracts make people more near-sighted as they develop. Other cataracts increase glare. Cataracts that are ignored and become worse can sometimes look white. The white color can be seen through the pupil. CAUSES   Aging. However, cataracts may occur at any age, even in newborns.  Certain drugs.  Trauma to the eye.  Certain diseases such as diabetes.  Specific eye diseases such as chronic inflammation inside the eye or a sudden attack of a rare form of glaucoma.  Inherited or acquired medical problems. SYMPTOMS   Gradual, progressive  drop in vision in the affected eye.  Severe, rapid visual loss. This most often happens when trauma is the cause. DIAGNOSIS  To detect a cataract, an eye doctor examines the lens. Cataracts are best diagnosed with an exam of the eyes with the pupils enlarged (dilated) by drops.  TREATMENT  For an early cataract, vision may improve by using different eyeglasses or stronger lighting. If that does not help your  vision, surgery is the only effective treatment. A cataract needs to be surgically removed when vision loss interferes with your everyday activities, such as driving, reading, or watching TV. A cataract may also have to be removed if it prevents examination or treatment of another eye problem. Surgery removes the cloudy lens and usually replaces it with a substitute lens (intraocular lens, IOL).  At a time when both you and your doctor agree, the cataract will be surgically removed. If you have cataracts in both eyes, only one is usually removed at a time. This allows the operated eye to heal and be out of danger from any possible problems after surgery (such as infection or poor wound healing). In rare cases, a cataract may be doing damage to your eye. In these cases, your caregiver may advise surgical removal right away. The vast majority of people who have cataract surgery have better vision afterward. HOME CARE INSTRUCTIONS  If you are not planning surgery, you may be asked to do the following:  Use different eyeglasses.  Use stronger or brighter lighting.  Ask your eye doctor about reducing your medicine dose or changing medicines if it is thought that a medicine caused your cataract. Changing medicines does not make the cataract go away on its own.  Become familiar with your surroundings. Poor vision can lead to injury. Avoid bumping into things on the affected side. You are at a higher risk for tripping or falling.  Exercise extreme care when driving or operating machinery.  Wear sunglasses if you are sensitive to bright light or experiencing problems with glare. SEEK IMMEDIATE MEDICAL CARE IF:   You have a worsening or sudden vision loss.  You notice redness, swelling, or increasing pain in the eye.  You have a fever. Document Released: 08/24/2005 Document Revised: 11/16/2011 Document Reviewed: 04/17/2011 [facility] Patient Information 2013 [place], [place].

## 2012-09-02 ENCOUNTER — Encounter (HOSPITAL_COMMUNITY): Payer: Self-pay

## 2012-09-02 ENCOUNTER — Encounter (HOSPITAL_COMMUNITY)
Admission: RE | Admit: 2012-09-02 | Discharge: 2012-09-02 | Disposition: A | Discharge status: Home / Self Care | Payer: Medicare Other | Source: Ambulatory Visit | Attending: Ophthalmology | Admitting: Ophthalmology

## 2012-09-02 DIAGNOSIS — H2589 Other age-related cataract: Secondary | ICD-10-CM | POA: Diagnosis not present

## 2012-09-02 DIAGNOSIS — I10 Essential (primary) hypertension: Secondary | ICD-10-CM | POA: Diagnosis not present

## 2012-09-02 DIAGNOSIS — Z01818 Encounter for other preprocedural examination: Secondary | ICD-10-CM | POA: Diagnosis not present

## 2012-09-02 DIAGNOSIS — J449 Chronic obstructive pulmonary disease, unspecified: Secondary | ICD-10-CM | POA: Diagnosis not present

## 2012-09-02 LAB — BASIC METABOLIC PANEL
BUN: 17 mg/dL (ref 6–23)
CO2: 35 mEq/L — ABNORMAL HIGH (ref 19–32)
Chloride: 100 mEq/L (ref 96–112)
Creatinine, Ser: 0.86 mg/dL (ref 0.50–1.10)
GFR calc Af Amer: 77 mL/min — ABNORMAL LOW (ref >90)
Glucose, Bld: 89 mg/dL (ref 70–99)
Potassium: 3.7 mEq/L (ref 3.5–5.1)

## 2012-09-06 MED ORDER — NEOMYCIN-POLYMYXIN-DEXAMETH 3.5-10000-0.1 OP OINT
TOPICAL_OINTMENT | OPHTHALMIC | Status: AC
  Filled 2012-09-06: qty 3.5

## 2012-09-06 MED ORDER — LIDOCAINE HCL 3.5 % OP GEL
OPHTHALMIC | Status: AC
  Filled 2012-09-06: qty 5

## 2012-09-06 MED ORDER — PHENYLEPHRINE HCL 2.5 % OP SOLN
OPHTHALMIC | Status: AC
  Filled 2012-09-06: qty 2

## 2012-09-06 MED ORDER — CYCLOPENTOLATE-PHENYLEPHRINE 0.2-1 % OP SOLN
OPHTHALMIC | Status: AC
  Filled 2012-09-06: qty 2

## 2012-09-06 MED ORDER — LIDOCAINE HCL (PF) 1 % IJ SOLN
INTRAMUSCULAR | Status: AC
  Filled 2012-09-06: qty 2

## 2012-09-06 MED ORDER — TETRACAINE HCL 0.5 % OP SOLN
OPHTHALMIC | Status: AC
  Filled 2012-09-06: qty 2

## 2012-09-08 ENCOUNTER — Encounter (HOSPITAL_COMMUNITY): Payer: Self-pay | Admitting: Anesthesiology

## 2012-09-08 ENCOUNTER — Encounter (HOSPITAL_COMMUNITY)
Admission: RE | Disposition: A | Discharge status: Home / Self Care | Payer: Self-pay | Source: Ambulatory Visit | Attending: Ophthalmology

## 2012-09-08 ENCOUNTER — Ambulatory Visit (HOSPITAL_COMMUNITY)
Admission: RE | Admit: 2012-09-08 | Discharge: 2012-09-08 | Disposition: A | Discharge status: Home / Self Care | Payer: Medicare Other | Source: Ambulatory Visit | Attending: Ophthalmology | Admitting: Ophthalmology

## 2012-09-08 ENCOUNTER — Ambulatory Visit (HOSPITAL_COMMUNITY): Payer: Medicare Other | Admitting: Anesthesiology

## 2012-09-08 ENCOUNTER — Encounter (HOSPITAL_COMMUNITY): Payer: Self-pay | Admitting: *Deleted

## 2012-09-08 DIAGNOSIS — I10 Essential (primary) hypertension: Secondary | ICD-10-CM | POA: Diagnosis not present

## 2012-09-08 DIAGNOSIS — J4489 Other specified chronic obstructive pulmonary disease: Secondary | ICD-10-CM | POA: Insufficient documentation

## 2012-09-08 DIAGNOSIS — Z01818 Encounter for other preprocedural examination: Secondary | ICD-10-CM | POA: Diagnosis not present

## 2012-09-08 DIAGNOSIS — H269 Unspecified cataract: Secondary | ICD-10-CM | POA: Diagnosis not present

## 2012-09-08 DIAGNOSIS — J449 Chronic obstructive pulmonary disease, unspecified: Secondary | ICD-10-CM | POA: Diagnosis not present

## 2012-09-08 DIAGNOSIS — H524 Presbyopia: Secondary | ICD-10-CM | POA: Diagnosis not present

## 2012-09-08 DIAGNOSIS — H2589 Other age-related cataract: Secondary | ICD-10-CM | POA: Diagnosis not present

## 2012-09-08 HISTORY — PX: CATARACT EXTRACTION W/PHACO: SHX586

## 2012-09-08 SURGERY — PHACOEMULSIFICATION, CATARACT, WITH IOL INSERTION
Anesthesia: Monitor Anesthesia Care | Site: Eye | Laterality: Right | Wound class: Clean

## 2012-09-08 MED ORDER — MIDAZOLAM HCL 2 MG/2ML IJ SOLN
1.0000 mg | INTRAMUSCULAR | Status: DC | PRN
  Administered 2012-09-08: 2 mg via INTRAVENOUS

## 2012-09-08 MED ORDER — EPINEPHRINE HCL 1 MG/ML IJ SOLN
INTRAOCULAR | Status: DC | PRN
  Administered 2012-09-08: 12:00:00

## 2012-09-08 MED ORDER — POVIDONE-IODINE 5 % OP SOLN
OPHTHALMIC | Status: DC | PRN
  Administered 2012-09-08: 1 via OPHTHALMIC

## 2012-09-08 MED ORDER — NEOMYCIN-POLYMYXIN-DEXAMETH 0.1 % OP OINT
TOPICAL_OINTMENT | OPHTHALMIC | Status: DC | PRN
  Administered 2012-09-08: 1 via OPHTHALMIC

## 2012-09-08 MED ORDER — FENTANYL CITRATE 0.05 MG/ML IJ SOLN: 25.0000 ug | INTRAMUSCULAR | Status: DC | PRN

## 2012-09-08 MED ORDER — CYCLOPENTOLATE-PHENYLEPHRINE 0.2-1 % OP SOLN
1.0000 [drp] | OPHTHALMIC | Status: AC
  Administered 2012-09-08 (×3): 1 [drp] via OPHTHALMIC

## 2012-09-08 MED ORDER — LIDOCAINE HCL (PF) 1 % IJ SOLN
INTRAMUSCULAR | Status: DC | PRN
  Administered 2012-09-08: .3 mL

## 2012-09-08 MED ORDER — TETRACAINE HCL 0.5 % OP SOLN
1.0000 [drp] | OPHTHALMIC | Status: AC
  Administered 2012-09-08 (×3): 1 [drp] via OPHTHALMIC

## 2012-09-08 MED ORDER — ONDANSETRON HCL 4 MG/2ML IJ SOLN: 4.0000 mg | Freq: Once | INTRAMUSCULAR | Status: AC | PRN

## 2012-09-08 MED ORDER — PHENYLEPHRINE HCL 2.5 % OP SOLN
1.0000 [drp] | OPHTHALMIC | Status: AC
  Administered 2012-09-08 (×3): 1 [drp] via OPHTHALMIC

## 2012-09-08 MED ORDER — BSS IO SOLN
INTRAOCULAR | Status: DC | PRN
  Administered 2012-09-08: 15 mL via INTRAOCULAR

## 2012-09-08 MED ORDER — MIDAZOLAM HCL 2 MG/2ML IJ SOLN
INTRAMUSCULAR | Status: AC
  Filled 2012-09-08: qty 2

## 2012-09-08 MED ORDER — EPINEPHRINE HCL 1 MG/ML IJ SOLN
INTRAMUSCULAR | Status: AC
  Filled 2012-09-08: qty 1

## 2012-09-08 MED ORDER — LIDOCAINE HCL 3.5 % OP GEL
1.0000 "application " | Freq: Once | OPHTHALMIC | Status: AC
  Administered 2012-09-08: 1 via OPHTHALMIC

## 2012-09-08 MED ORDER — PROVISC 10 MG/ML IO SOLN
INTRAOCULAR | Status: DC | PRN
  Administered 2012-09-08: 8.5 mg via INTRAOCULAR

## 2012-09-08 MED ORDER — LACTATED RINGERS IV SOLN
INTRAVENOUS | Status: DC
  Administered 2012-09-08: 1000 mL via INTRAVENOUS

## 2012-09-08 MED ORDER — LIDOCAINE 3.5 % OP GEL OPTIME - NO CHARGE
OPHTHALMIC | Status: DC | PRN
  Administered 2012-09-08: 1 [drp] via OPHTHALMIC

## 2012-09-08 SURGICAL SUPPLY — 32 items

## 2012-09-08 NOTE — Anesthesia Preprocedure Evaluation (Signed)
Anesthesia Evaluation  Patient identified by MRN, date of birth, ID band Patient awake    Reviewed: Allergy & Precautions, H&P , NPO status , Patient's Chart, lab work & pertinent test results  Airway Mallampati: II      Dental  (+) Teeth Intact   Pulmonary COPD oxygen dependent,  breath sounds clear to auscultation        Cardiovascular hypertension, Pt. on medications Rhythm:Regular     Neuro/Psych PSYCHIATRIC DISORDERS Depression    GI/Hepatic   Endo/Other  Hypothyroidism   Renal/GU      Musculoskeletal   Abdominal   Peds  Hematology   Anesthesia Other Findings   Reproductive/Obstetrics                           Anesthesia Physical Anesthesia Plan  ASA: III  Anesthesia Plan: MAC   Post-op Pain Management:    Induction: Intravenous  Airway Management Planned: Nasal Cannula  Additional Equipment:   Intra-op Plan:   Post-operative Plan:   Informed Consent: I have reviewed the patients History and Physical, chart, labs and discussed the procedure including the risks, benefits and alternatives for the proposed anesthesia with the patient or authorized representative who has indicated his/her understanding and acceptance.     Plan Discussed with:   Anesthesia Plan Comments:         Anesthesia Quick Evaluation  

## 2012-09-08 NOTE — H&P (Signed)
I have reviewed the H&P, the patient was re-examined, and I have identified no interval changes in medical condition and plan of care since the history and physical of record  

## 2012-09-08 NOTE — Brief Op Note (Signed)
Pre-Op Dx: Cataract OD Post-Op Dx: Cataract OD Surgeon: Arye Weyenberg Anesthesia: Topical with MAC Surgery: Cataract Extraction with Intraocular lens Implant OD Implant: B&L enVista Specimen: None Complications: None 

## 2012-09-08 NOTE — Transfer of Care (Signed)
Immediate Anesthesia Transfer of Care Note  Patient: [name]  Procedure(s) Performed: Procedure(s) (LRB) with comments: CATARACT EXTRACTION PHACO AND INTRAOCULAR LENS PLACEMENT (IOC) (Right) - CDE:19.67  Patient Location: PACU and Short Stay  Anesthesia Type:MAC  Level of Consciousness: awake  Airway & Oxygen Therapy: Patient Spontanous Breathing  Post-op Assessment: Report given to PACU RN  Post vital signs: Reviewed  Complications: No apparent anesthesia complications

## 2012-09-08 NOTE — Anesthesia Postprocedure Evaluation (Signed)
  Anesthesia Post-op Note  Patient: [name]  Procedure(s) Performed: Procedure(s) (LRB) with comments: CATARACT EXTRACTION PHACO AND INTRAOCULAR LENS PLACEMENT (IOC) (Right) - CDE:19.67  Patient Location: PACU and Short Stay  Anesthesia Type:MAC  Level of Consciousness: awake, alert  and oriented  Airway and Oxygen Therapy: Patient Spontanous Breathing  Post-op Pain: none  Post-op Assessment: Post-op Vital signs reviewed, Patient's Cardiovascular Status Stable, Respiratory Function Stable, Patent Airway and No signs of Nausea or vomiting  Post-op Vital Signs: Reviewed and stable  Complications: No apparent anesthesia complications

## 2012-09-09 ENCOUNTER — Encounter (HOSPITAL_COMMUNITY): Payer: Self-pay | Admitting: Ophthalmology

## 2012-09-09 NOTE — Op Note (Signed)
NAMESANDRA, BRENTS NO.:  [id number]  MEDICAL RECORD NO.:  [id number]  LOCATION:  [facility]                          FACILITY:  [facility]  PHYSICIAN:  Susanne Greenhouse, MD       DATE OF BIRTH:  [birth year]  DATE OF PROCEDURE:  09/08/2012 DATE OF DISCHARGE:                              OPERATIVE REPORT   PREOPERATIVE DIAGNOSIS:  Combined cataract, right eye, diagnosis code 366.19.  POSTOPERATIVE DIAGNOSIS:  Combined cataract, right eye, diagnosis code 366.19.  OPERATION PERFORMED:  Phacoemulsification with posterior chamber intraocular lens implantation, right eye.  ANESTHESIA:  Topical with monitored anesthesia care and IV sedation.  OPERATIVE SUMMARY:  In the preoperative area, dilating drops were placed into the right eye.  The patient was then brought into the operating room where she was placed under general anesthesia.  The eye was then prepped and draped.  Beginning with a 75 blade, a paracentesis port was made at the surgeon's 2 o'clock position.  The anterior chamber was then filled with a 1% nonpreserved lidocaine solution with epinephrine.  This was followed by Viscoat to deepen the chamber.  A small fornix-based peritomy was performed superiorly.  Next, a single iris hook was placed through the limbus superiorly.  A 2.4-mm keratome blade was then used to make a clear corneal incision over the iris hook.  A bent cystotome needle and Utrata forceps were used to create a continuous tear capsulotomy.  Hydrodissection was performed using balanced salt solution on a fine cannula.  The lens nucleus was then removed using phacoemulsification in a quadrant cracking technique.  The cortical material was then removed with irrigation and aspiration.  The capsular bag and anterior chamber were refilled with Provisc.  The wound was widened to approximately 3 mm and a posterior chamber intraocular lens was placed into the capsular bag without difficulty using an  [employer] lens injecting system.  A single 10-0 nylon suture was then used to close the incision as well as stromal hydration.  The Provisc was removed from the anterior chamber and capsular bag with irrigation and aspiration.  At this point, the wounds were tested for leak, which were negative.  The anterior chamber remained deep and stable.  The patient tolerated the procedure well.  There were no operative complications, and she awoke from general anesthesia without problem.  No surgical specimens.  Prosthetic device used Bausch and Lomb posterior chamber lens, model EnVista model number MX60, power of 22.5, serial number is [id number].          ______________________________ Susanne Greenhouse, MD    KEH/MEDQ  D:  09/08/2012  T:  09/08/2012  Job:  [id number]

## 2012-09-12 DIAGNOSIS — H2589 Other age-related cataract: Secondary | ICD-10-CM | POA: Diagnosis not present

## 2012-09-15 ENCOUNTER — Encounter (HOSPITAL_COMMUNITY): Payer: Self-pay | Admitting: Pharmacy Technician

## 2012-09-16 ENCOUNTER — Encounter (HOSPITAL_COMMUNITY)
Admission: RE | Admit: 2012-09-16 | Discharge: 2012-09-16 | Payer: Medicare Other | Source: Ambulatory Visit | Admitting: Ophthalmology

## 2012-09-16 ENCOUNTER — Encounter (HOSPITAL_COMMUNITY): Payer: Self-pay

## 2012-09-16 MED ORDER — PHENYLEPHRINE HCL 2.5 % OP SOLN
OPHTHALMIC | Status: AC
  Filled 2012-09-16: qty 2

## 2012-09-16 MED ORDER — LIDOCAINE HCL 3.5 % OP GEL
OPHTHALMIC | Status: AC
  Filled 2012-09-16: qty 5

## 2012-09-16 MED ORDER — CYCLOPENTOLATE-PHENYLEPHRINE 0.2-1 % OP SOLN
OPHTHALMIC | Status: AC
  Filled 2012-09-16: qty 2

## 2012-09-16 MED ORDER — TETRACAINE HCL 0.5 % OP SOLN
OPHTHALMIC | Status: AC
  Filled 2012-09-16: qty 2

## 2012-09-16 MED ORDER — LIDOCAINE HCL (PF) 1 % IJ SOLN
INTRAMUSCULAR | Status: AC
  Filled 2012-09-16: qty 2

## 2012-09-16 MED ORDER — NEOMYCIN-POLYMYXIN-DEXAMETH 3.5-10000-0.1 OP OINT
TOPICAL_OINTMENT | OPHTHALMIC | Status: AC
  Filled 2012-09-16: qty 3.5

## 2012-09-19 ENCOUNTER — Encounter (HOSPITAL_COMMUNITY): Payer: Self-pay | Admitting: *Deleted

## 2012-09-19 ENCOUNTER — Encounter (HOSPITAL_COMMUNITY)
Admission: RE | Disposition: A | Discharge status: Home / Self Care | Payer: Self-pay | Source: Ambulatory Visit | Attending: Ophthalmology

## 2012-09-19 ENCOUNTER — Ambulatory Visit (HOSPITAL_COMMUNITY): Payer: Medicare Other | Admitting: Anesthesiology

## 2012-09-19 ENCOUNTER — Encounter (HOSPITAL_COMMUNITY): Payer: Self-pay | Admitting: Anesthesiology

## 2012-09-19 ENCOUNTER — Ambulatory Visit (HOSPITAL_COMMUNITY)
Admission: RE | Admit: 2012-09-19 | Discharge: 2012-09-19 | Disposition: A | Discharge status: Home / Self Care | Payer: Medicare Other | Source: Ambulatory Visit | Attending: Ophthalmology | Admitting: Ophthalmology

## 2012-09-19 DIAGNOSIS — H2589 Other age-related cataract: Secondary | ICD-10-CM | POA: Insufficient documentation

## 2012-09-19 DIAGNOSIS — Z9981 Dependence on supplemental oxygen: Secondary | ICD-10-CM | POA: Diagnosis not present

## 2012-09-19 DIAGNOSIS — I10 Essential (primary) hypertension: Secondary | ICD-10-CM | POA: Diagnosis not present

## 2012-09-19 DIAGNOSIS — H269 Unspecified cataract: Secondary | ICD-10-CM | POA: Diagnosis not present

## 2012-09-19 DIAGNOSIS — J449 Chronic obstructive pulmonary disease, unspecified: Secondary | ICD-10-CM | POA: Insufficient documentation

## 2012-09-19 DIAGNOSIS — J4489 Other specified chronic obstructive pulmonary disease: Secondary | ICD-10-CM | POA: Insufficient documentation

## 2012-09-19 HISTORY — PX: CATARACT EXTRACTION W/PHACO: SHX586

## 2012-09-19 SURGERY — PHACOEMULSIFICATION, CATARACT, WITH IOL INSERTION
Anesthesia: Monitor Anesthesia Care | Site: Eye | Laterality: Left | Wound class: Clean

## 2012-09-19 MED ORDER — LIDOCAINE HCL (PF) 1 % IJ SOLN
INTRAMUSCULAR | Status: DC | PRN
  Administered 2012-09-19: .7 mL

## 2012-09-19 MED ORDER — LACTATED RINGERS IV SOLN
INTRAVENOUS | Status: DC
  Administered 2012-09-19: 1000 mL via INTRAVENOUS

## 2012-09-19 MED ORDER — MIDAZOLAM HCL 2 MG/2ML IJ SOLN
INTRAMUSCULAR | Status: AC
  Filled 2012-09-19: qty 2

## 2012-09-19 MED ORDER — LIDOCAINE HCL 3.5 % OP GEL
1.0000 "application " | Freq: Once | OPHTHALMIC | Status: AC
  Administered 2012-09-19: 1 via OPHTHALMIC

## 2012-09-19 MED ORDER — CYCLOPENTOLATE-PHENYLEPHRINE 0.2-1 % OP SOLN
1.0000 [drp] | OPHTHALMIC | Status: AC
  Administered 2012-09-19 (×3): 1 [drp] via OPHTHALMIC

## 2012-09-19 MED ORDER — MIDAZOLAM HCL 2 MG/2ML IJ SOLN
1.0000 mg | INTRAMUSCULAR | Status: DC | PRN
  Administered 2012-09-19 (×2): 1 mg via INTRAVENOUS

## 2012-09-19 MED ORDER — KETOROLAC TROMETHAMINE 0.5 % OP SOLN: 1.0000 [drp] | OPHTHALMIC | Status: AC

## 2012-09-19 MED ORDER — LIDOCAINE 3.5 % OP GEL OPTIME - NO CHARGE
OPHTHALMIC | Status: DC | PRN
  Administered 2012-09-19: 1 [drp] via OPHTHALMIC

## 2012-09-19 MED ORDER — EPINEPHRINE HCL 1 MG/ML IJ SOLN
INTRAOCULAR | Status: DC | PRN
  Administered 2012-09-19: 12:00:00

## 2012-09-19 MED ORDER — MIDAZOLAM HCL 5 MG/5ML IJ SOLN
INTRAMUSCULAR | Status: DC | PRN
  Administered 2012-09-19 (×2): 1 mg via INTRAVENOUS

## 2012-09-19 MED ORDER — PHENYLEPHRINE HCL 2.5 % OP SOLN
1.0000 [drp] | OPHTHALMIC | Status: AC
  Administered 2012-09-19 (×3): 1 [drp] via OPHTHALMIC

## 2012-09-19 MED ORDER — PROVISC 10 MG/ML IO SOLN
INTRAOCULAR | Status: DC | PRN
  Administered 2012-09-19: 8.5 mg via INTRAOCULAR

## 2012-09-19 MED ORDER — EPINEPHRINE HCL 1 MG/ML IJ SOLN
INTRAMUSCULAR | Status: AC
  Filled 2012-09-19: qty 1

## 2012-09-19 MED ORDER — POVIDONE-IODINE 5 % OP SOLN
OPHTHALMIC | Status: DC | PRN
  Administered 2012-09-19: 1 via OPHTHALMIC

## 2012-09-19 MED ORDER — TETRACAINE HCL 0.5 % OP SOLN
1.0000 [drp] | OPHTHALMIC | Status: AC
  Administered 2012-09-19 (×3): 1 [drp] via OPHTHALMIC

## 2012-09-19 MED ORDER — BSS IO SOLN
INTRAOCULAR | Status: DC | PRN
  Administered 2012-09-19: 15 mL via INTRAOCULAR

## 2012-09-19 SURGICAL SUPPLY — 32 items

## 2012-09-19 NOTE — Transfer of Care (Signed)
Immediate Anesthesia Transfer of Care Note  Patient: [name]  Procedure(s) Performed: Procedure(s) (LRB) with comments: CATARACT EXTRACTION PHACO AND INTRAOCULAR LENS PLACEMENT (IOC) (Left) - CDE:19.83  Patient Location: PACU and Short Stay  Anesthesia Type:MAC  Level of Consciousness: awake  Airway & Oxygen Therapy: Patient Spontanous Breathing  Post-op Assessment: Report given to PACU RN  Post vital signs: Reviewed  Complications: No apparent anesthesia complications

## 2012-09-19 NOTE — Anesthesia Preprocedure Evaluation (Signed)
Anesthesia Evaluation  Patient identified by MRN, date of birth, ID band Patient awake    Reviewed: Allergy & Precautions, H&P , NPO status , Patient's Chart, lab work & pertinent test results  Airway Mallampati: II      Dental  (+) Teeth Intact   Pulmonary COPD oxygen dependent,  breath sounds clear to auscultation        Cardiovascular hypertension, Pt. on medications Rhythm:Regular     Neuro/Psych PSYCHIATRIC DISORDERS Depression    GI/Hepatic   Endo/Other  Hypothyroidism   Renal/GU      Musculoskeletal   Abdominal   Peds  Hematology   Anesthesia Other Findings   Reproductive/Obstetrics                           Anesthesia Physical Anesthesia Plan  ASA: III  Anesthesia Plan: MAC   Post-op Pain Management:    Induction: Intravenous  Airway Management Planned: Nasal Cannula  Additional Equipment:   Intra-op Plan:   Post-operative Plan:   Informed Consent: I have reviewed the patients History and Physical, chart, labs and discussed the procedure including the risks, benefits and alternatives for the proposed anesthesia with the patient or authorized representative who has indicated his/her understanding and acceptance.     Plan Discussed with:   Anesthesia Plan Comments:         Anesthesia Quick Evaluation

## 2012-09-19 NOTE — Brief Op Note (Signed)
Pre-Op Dx: Cataract OS Post-Op Dx: Cataract OS Surgeon: Kajal Scalici Anesthesia: Topical with MAC Surgery: Cataract Extraction with Intraocular lens Implant OS Implant: B&L enVista Specimen: None Complications: None 

## 2012-09-19 NOTE — Anesthesia Postprocedure Evaluation (Signed)
  Anesthesia Post-op Note  Patient: [name]  Procedure(s) Performed: Procedure(s) (LRB) with comments: CATARACT EXTRACTION PHACO AND INTRAOCULAR LENS PLACEMENT (IOC) (Left) - CDE:19.83  Patient Location: PACU and Short Stay  Anesthesia Type:MAC  Level of Consciousness: awake, alert  and oriented  Airway and Oxygen Therapy: Patient Spontanous Breathing  Post-op Pain: none  Post-op Assessment: Post-op Vital signs reviewed, Patient's Cardiovascular Status Stable, Respiratory Function Stable, Patent Airway and No signs of Nausea or vomiting  Post-op Vital Signs: Reviewed and stable  Complications: No apparent anesthesia complications

## 2012-09-19 NOTE — H&P (Signed)
I have reviewed the H&P, the patient was re-examined, and I have identified no interval changes in medical condition and plan of care since the history and physical of record  

## 2012-09-20 MED ORDER — NEOMYCIN-POLYMYXIN-DEXAMETH 0.1 % OP OINT
TOPICAL_OINTMENT | OPHTHALMIC | Status: AC | PRN
  Administered 2012-09-19: 1 via OPHTHALMIC

## 2012-09-20 NOTE — Op Note (Signed)
[name], [name]                 ACCOUNT NO.:  [id number]  MEDICAL RECORD NO.:  [id number]  LOCATION:  [facility]                          FACILITY:  [facility]  PHYSICIAN:  Susanne Greenhouse, MD       DATE OF BIRTH:  Aug 11, 1941  DATE OF PROCEDURE:  09/19/2012 DATE OF DISCHARGE:  09/19/2012                              OPERATIVE REPORT   PREOPERATIVE DIAGNOSIS:  Combined cataract, left eye, diagnosis code 366.19.  POSTOPERATIVE DIAGNOSIS:  Combined cataract, left eye, diagnosis code 366.19.  OPERATION PERFORMED:  Phacoemulsification with posterior chamber intraocular lens implantation, left eye.  SURGEON:  Bonne Dolores. Kirstyn Lean, MD  ANESTHESIA:  Topical with monitored anesthesia care and IV sedation.  OPERATIVE SUMMARY:  In the preoperative area, dilating drops were placed into the left eye.  The patient was then brought into the operating room where she was placed under general anesthesia.  The eye was then prepped and draped.  Beginning with a 75 blade, a paracentesis port was made at the surgeon's 2 o'clock position.  The anterior chamber was then filled with a 1% nonpreserved lidocaine solution with epinephrine.  This was followed by Viscoat to deepen the chamber.  A small fornix-based peritomy was performed superiorly.  Next, a single iris hook was placed through the limbus superiorly.  A 2.4-mm keratome blade was then used to make a clear corneal incision over the iris hook.  A bent cystotome needle and Utrata forceps were used to create a continuous tear capsulotomy.  Hydrodissection was performed using balanced salt solution on a fine cannula.  The lens nucleus was then removed using phacoemulsification in a quadrant cracking technique.  The cortical material was then removed with irrigation and aspiration.  The capsular bag and anterior chamber were refilled with Provisc.  The wound was widened to approximately 3 mm and a posterior chamber intraocular lens was placed into the capsular bag  without difficulty using an [employer] lens injecting system.  A single 10-0 nylon suture was then used to close the incision as well as stromal hydration.  The Provisc was removed from the anterior chamber and capsular bag with irrigation and aspiration.  At this point, the wounds were tested for leak, which were negative.  The anterior chamber remained deep and stable.  The patient tolerated the procedure well.  There were no operative complications, and she awoke from general anesthesia without problem.  No surgical specimens.  Prosthetic device used is a Theme park manager, model EnVista, model number MX60, power of 24.0, serial number is [id number].          ______________________________ Susanne Greenhouse, MD     KEH/MEDQ  D:  09/19/2012  T:  09/20/2012  Job:  [id number]

## 2012-09-21 ENCOUNTER — Ambulatory Visit: Payer: Medicare Other | Admitting: Critical Care Medicine

## 2012-09-21 ENCOUNTER — Encounter: Payer: Self-pay | Admitting: Critical Care Medicine

## 2012-09-21 ENCOUNTER — Ambulatory Visit (INDEPENDENT_AMBULATORY_CARE_PROVIDER_SITE_OTHER): Payer: Medicare Other | Admitting: Critical Care Medicine

## 2012-09-21 VITALS — BP 154/72 | HR 86 | Temp 97.5°F | Ht 60.0 in | Wt 109.4 lb

## 2012-09-21 DIAGNOSIS — J449 Chronic obstructive pulmonary disease, unspecified: Secondary | ICD-10-CM | POA: Diagnosis not present

## 2012-09-21 NOTE — Patient Instructions (Addendum)
No change in medications. Return in         4 months 

## 2012-09-21 NOTE — Progress Notes (Signed)
Subjective:    Patient ID: [name], female    DOB: Feb 22, 1941, 72 y.o.   MRN: [id number]  HPI 10/14/11 Pt seen 1/13 for copd exac and rx with omniceph/pred Now feeling better.  Now some mucus and is congested.  Sl amount of hemoptysis in mucus. No real chest pain.  Notes sl wheeze.  Not dyspneic at rest. Pt denies any significant sore throat, nasal congestion or excess secretions, fever, chills, sweats, unintended weight loss, pleurtic or exertional chest pain, orthopnea PND, or leg swelling Pt denies any increase in rescue therapy over baseline, denies waking up needing it or having any early am or nocturnal exacerbations of coughing/wheezing/or dyspnea. Pt also denies any obvious fluctuation in symptoms with  weather or environmental change or other alleviating or aggravating factors  03/14/2012 Since last OV had another flare up end of 5/13.  Rx [place]. Has about 4 x per year. Notes mild cough, not severe.  No real chest pain.   Mucus now is white and sl tint. Pt denies any significant sore throat, nasal congestion or excess secretions, fever, chills, sweats, unintended weight loss, pleurtic or exertional chest pain, orthopnea PND, or leg swelling Pt denies any increase in rescue therapy over baseline, denies waking up needing it or having any early am or nocturnal exacerbations of coughing/wheezing/or dyspnea. Pt also denies any obvious fluctuation in symptoms with  weather or environmental change or other alleviating or aggravating factors  06/01/2012 Since last ov no further copd flareups.  Pt took Daliresp, symptoms continued.  Nausea and weak/tired and not sleeping.   Pt denies any significant sore throat, nasal congestion or excess secretions, fever, chills, sweats, unintended weight loss, pleurtic or exertional chest pain, orthopnea PND, or leg swelling Pt denies any increase in rescue therapy over baseline, denies waking up needing it or having any early am or nocturnal  exacerbations of coughing/wheezing/or dyspnea. Pt also denies any obvious fluctuation in symptoms with  weather or environmental change or other alleviating or aggravating factors   09/21/2012 Pt is better than before. Only one exac since 05/2012.  Pt is breathing better. Now no cough. Dyspnea is better. Pt denies any significant sore throat, nasal congestion or excess secretions, fever, chills, sweats, unintended weight loss, pleurtic or exertional chest pain, orthopnea PND, or leg swelling Pt denies any increase in rescue therapy over baseline, denies waking up needing it or having any early am or nocturnal exacerbations of coughing/wheezing/or dyspnea. Pt also denies any obvious fluctuation in symptoms with  weather or environmental change or other alleviating or aggravating factors      Past Medical History  Diagnosis Date  . COPD (chronic obstructive pulmonary disease)   . Hypertension   . Hyperlipidemia   . History of tobacco abuse   . Mild depression   . Hypothyroidism      Family History  Problem Relation Age of Onset  . Allergies Father   . Heart disease Mother   . Rheum arthritis Mother   . Breast cancer Daughter   . Breast cancer Sister   . Cancer Maternal Aunt      History   Social History  . Marital Status: Married    Spouse Name: N/A    Number of Children: 2  . Years of Education: N/A   Occupational History  . retired     Interior and spatial designer   Social History Main Topics  . Smoking status: Former Smoker -- 1.0 packs/day for 50 years    Types: Cigarettes  Quit date: 09/07/2008  . Smokeless tobacco: Never Used  . Alcohol Use: No  . Drug Use: No  . Sexually Active: Not on file   Other Topics Concern  . Not on file   Social History Narrative  . No narrative on file     No Known Allergies   Outpatient Prescriptions Prior to Visit  Medication Sig Dispense Refill  . albuterol (PROAIR HFA) 108 (90 BASE) MCG/ACT inhaler Inhale 2 puffs into the lungs  every 6 (six) hours as needed. Shortness of breath      . albuterol (PROVENTIL) (2.5 MG/3ML) 0.083% nebulizer solution Take 3 mLs (2.5 mg total) by nebulization 3 (three) times daily. DX:  496 FILE MCR PART B  270 mL  3  . ALPRAZolam (XANAX) 0.25 MG tablet Take 1 tablet (0.25 mg total) by mouth 3 (three) times daily as needed for sleep or anxiety.  30 tablet  0  . buPROPion (WELLBUTRIN) 100 MG tablet Take 100 mg by mouth 2 (two) times daily.      Marland Kitchen diltiazem (DILTIAZEM HCL CD) 360 MG 24 hr capsule Take 360 mg by mouth daily.      Marland Kitchen levothyroxine (SYNTHROID, LEVOTHROID) 25 MCG tablet Take 25 mcg by mouth daily.        . Multiple Vitamin (MULTIVITAMIN) capsule Take 1 capsule by mouth daily.        . pravastatin (PRAVACHOL) 40 MG tablet Take 40 mg by mouth at bedtime.      Marland Kitchen tiotropium (SPIRIVA) 18 MCG inhalation capsule Place 1 capsule (18 mcg total) into inhaler and inhale daily.  90 capsule  3  . torsemide (DEMADEX) 20 MG tablet Take 20 mg by mouth Daily.       Facility-Administered Medications Prior to Visit  Medication Dose Route Frequency Provider Last Rate Last Dose  . neomycin-polymyxin-dexameth (MAXITROL) 0.1 % ophth ointment    PRN Gemma Payor, MD   1 application at 09/19/12 1227  Last reviewed on 09/21/2012  2:06 PM by Storm Frisk, MD    Review of Systems  Constitutional: Negative for fever and unexpected weight change.  HENT: Positive for congestion, sneezing and trouble swallowing. Negative for ear pain, nosebleeds, sore throat, rhinorrhea, dental problem, postnasal drip and sinus pressure.   Eyes: Positive for itching. Negative for redness.  Respiratory: Positive for cough and shortness of breath. Negative for chest tightness and wheezing.   Cardiovascular: Positive for leg swelling. Negative for palpitations.  Gastrointestinal: Positive for nausea. Negative for vomiting.  Genitourinary: Negative for dysuria.  Musculoskeletal: Negative for joint swelling.  Skin: Negative for  rash.  Neurological: Positive for headaches.  Hematological: Bruises/bleeds easily.  Psychiatric/Behavioral: Negative for dysphoric mood. The patient is nervous/anxious.        Objective:   Physical Exam  Vitals reviewed. Constitutional: She is oriented to person, place, and time. She appears well-developed and well-nourished. No distress.       Body mass index is 22.15 kg/(m^2).   HENT:  Head: Normocephalic and atraumatic.  Right Ear: External ear normal.  Left Ear: External ear normal.  Mouth/Throat: Oropharynx is clear and moist. No oropharyngeal exudate.  Eyes: Conjunctivae normal and EOM are normal. Pupils are equal, round, and reactive to light. Right eye exhibits no discharge. Left eye exhibits no discharge. No scleral icterus.  Neck: Normal range of motion. Neck supple. No JVD present. No tracheal deviation present. No thyromegaly present.  Cardiovascular: Normal rate, regular rhythm, normal heart sounds and intact distal pulses.  Exam reveals no gallop and no friction rub.   No murmur heard. Pulmonary/Chest: Effort normal. No respiratory distress. She has wheezes. She has no rales. She exhibits no tenderness.       barrell chest + Purse lip breathing + Some wheze +  Abdominal: Soft. Bowel sounds are normal. She exhibits no distension and no mass. There is no tenderness. There is no rebound and no guarding.  Musculoskeletal: Normal range of motion. She exhibits no edema and no tenderness.  Lymphadenopathy:    She has no cervical adenopathy.  Neurological: She is alert and oriented to person, place, and time. She has normal reflexes. No cranial nerve deficit. She exhibits normal muscle tone. Coordination normal.  Skin: Skin is warm and dry. No rash noted. She is not diaphoretic. No erythema. No pallor.  Psychiatric: She has a normal mood and affect. Her behavior is normal. Judgment and thought content normal.       midlly anxious    BP 154/72  Pulse 86  Temp 97.5 F (36.4  C) (Oral)  Ht 5' (1.524 m)  Wt 109 lb 6.4 oz (49.624 kg)  BMI 21.37 kg/m2  SpO2 94%        Assessment & Plan:   COPD Golds D, frequent exacerbations Gold stage D. COPD with frequent exacerbations and the patient failed Daliresp. The patient is stable this time Plan Maintain inhaled medications as prescribed Return 4 months    Updated Medication List Outpatient Encounter Prescriptions as of 09/21/2012  Medication Sig Dispense Refill  . albuterol (PROAIR HFA) 108 (90 BASE) MCG/ACT inhaler Inhale 2 puffs into the lungs every 6 (six) hours as needed. Shortness of breath      . albuterol (PROVENTIL) (2.5 MG/3ML) 0.083% nebulizer solution Take 3 mLs (2.5 mg total) by nebulization 3 (three) times daily. DX:  496 FILE MCR PART B  270 mL  3  . ALPRAZolam (XANAX) 0.25 MG tablet Take 1 tablet (0.25 mg total) by mouth 3 (three) times daily as needed for sleep or anxiety.  30 tablet  0  . buPROPion (WELLBUTRIN) 100 MG tablet Take 100 mg by mouth 2 (two) times daily.      Marland Kitchen diltiazem (DILTIAZEM HCL CD) 360 MG 24 hr capsule Take 360 mg by mouth daily.      Marland Kitchen levothyroxine (SYNTHROID, LEVOTHROID) 25 MCG tablet Take 25 mcg by mouth daily.        . Multiple Vitamin (MULTIVITAMIN) capsule Take 1 capsule by mouth daily.        . pravastatin (PRAVACHOL) 40 MG tablet Take 40 mg by mouth at bedtime.      Marland Kitchen tiotropium (SPIRIVA) 18 MCG inhalation capsule Place 1 capsule (18 mcg total) into inhaler and inhale daily.  90 capsule  3  . torsemide (DEMADEX) 20 MG tablet Take 20 mg by mouth Daily.       Facility-Administered Encounter Medications as of 09/21/2012  Medication Dose Route Frequency Provider Last Rate Last Dose  . neomycin-polymyxin-dexameth (MAXITROL) 0.1 % ophth ointment    PRN Gemma Payor, MD   1 application at 09/19/12 1227

## 2012-09-22 ENCOUNTER — Encounter (HOSPITAL_COMMUNITY): Payer: Self-pay | Admitting: Ophthalmology

## 2012-09-22 NOTE — Assessment & Plan Note (Signed)
Gold stage D. COPD with frequent exacerbations and the patient failed Daliresp. The patient is stable this time Plan Maintain inhaled medications as prescribed Return 4 months

## 2012-09-28 ENCOUNTER — Ambulatory Visit: Payer: Medicare Other | Admitting: Critical Care Medicine

## 2012-10-06 ENCOUNTER — Other Ambulatory Visit (HOSPITAL_COMMUNITY): Payer: Medicare Other

## 2012-10-07 ENCOUNTER — Other Ambulatory Visit (HOSPITAL_COMMUNITY): Payer: Medicare Other

## 2012-10-13 DIAGNOSIS — R609 Edema, unspecified: Secondary | ICD-10-CM | POA: Diagnosis not present

## 2012-10-13 DIAGNOSIS — J449 Chronic obstructive pulmonary disease, unspecified: Secondary | ICD-10-CM | POA: Diagnosis not present

## 2012-10-13 DIAGNOSIS — E782 Mixed hyperlipidemia: Secondary | ICD-10-CM | POA: Diagnosis not present

## 2012-10-13 DIAGNOSIS — I10 Essential (primary) hypertension: Secondary | ICD-10-CM | POA: Diagnosis not present

## 2012-11-08 ENCOUNTER — Other Ambulatory Visit: Payer: Self-pay | Admitting: Critical Care Medicine

## 2012-12-06 DIAGNOSIS — L97929 Non-pressure chronic ulcer of unspecified part of left lower leg with unspecified severity: Secondary | ICD-10-CM | POA: Diagnosis not present

## 2012-12-06 DIAGNOSIS — R413 Other amnesia: Secondary | ICD-10-CM | POA: Diagnosis not present

## 2012-12-06 DIAGNOSIS — I83219 Varicose veins of right lower extremity with both ulcer of unspecified site and inflammation: Secondary | ICD-10-CM | POA: Diagnosis not present

## 2012-12-06 DIAGNOSIS — E785 Hyperlipidemia, unspecified: Secondary | ICD-10-CM | POA: Diagnosis not present

## 2012-12-06 DIAGNOSIS — I83229 Varicose veins of left lower extremity with both ulcer of unspecified site and inflammation: Secondary | ICD-10-CM | POA: Diagnosis not present

## 2012-12-06 DIAGNOSIS — J449 Chronic obstructive pulmonary disease, unspecified: Secondary | ICD-10-CM | POA: Diagnosis not present

## 2013-02-03 ENCOUNTER — Ambulatory Visit (INDEPENDENT_AMBULATORY_CARE_PROVIDER_SITE_OTHER): Payer: Medicare Other | Admitting: Critical Care Medicine

## 2013-02-03 ENCOUNTER — Encounter: Payer: Self-pay | Admitting: Critical Care Medicine

## 2013-02-03 VITALS — BP 126/84 | HR 93 | Temp 97.4°F | Ht 64.5 in | Wt 112.4 lb

## 2013-02-03 DIAGNOSIS — J449 Chronic obstructive pulmonary disease, unspecified: Secondary | ICD-10-CM

## 2013-02-03 MED ORDER — PREDNISONE 10 MG PO TABS: ORAL_TABLET | ORAL | Status: DC

## 2013-02-03 MED ORDER — ALBUTEROL SULFATE (2.5 MG/3ML) 0.083% IN NEBU: INHALATION_SOLUTION | RESPIRATORY_TRACT | Status: DC

## 2013-02-03 MED ORDER — FORMOTEROL FUMARATE 20 MCG/2ML IN NEBU: 20.0000 ug | INHALATION_SOLUTION | Freq: Two times a day (BID) | RESPIRATORY_TRACT | Status: DC

## 2013-02-03 NOTE — Progress Notes (Signed)
Subjective:    Patient ID: [name], female    DOB: 05-29-1941, 72 y.o.   MRN: [id number]  HPI  02/03/2013 Pt notes more dyspnea for a few months.  Not much mucus Notes some wheezing.  Notes some chest tightness and pain.  Mucus is clear and occ yellow  Past Medical History  Diagnosis Date  . COPD (chronic obstructive pulmonary disease)   . Hypertension   . Hyperlipidemia   . History of tobacco abuse   . Mild depression   . Hypothyroidism      Family History  Problem Relation Age of Onset  . Allergies Father   . Heart disease Mother   . Rheum arthritis Mother   . Breast cancer Daughter   . Breast cancer Sister   . Cancer Maternal Aunt      History   Social History  . Marital Status: Married    Spouse Name: N/A    Number of Children: 2  . Years of Education: N/A   Occupational History  . retired     Interior and spatial designer   Social History Main Topics  . Smoking status: Former Smoker -- 1.00 packs/day for 50 years    Types: Cigarettes    Quit date: 09/07/2008  . Smokeless tobacco: Never Used  . Alcohol Use: No  . Drug Use: No  . Sexually Active: Not on file   Other Topics Concern  . Not on file   Social History Narrative  . No narrative on file     No Known Allergies   Outpatient Prescriptions Prior to Visit  Medication Sig Dispense Refill  . albuterol (PROAIR HFA) 108 (90 BASE) MCG/ACT inhaler Inhale 2 puffs into the lungs every 6 (six) hours as needed. Shortness of breath      . ALPRAZolam (XANAX) 0.25 MG tablet Take 1 tablet (0.25 mg total) by mouth 3 (three) times daily as needed for sleep or anxiety.  30 tablet  0  . buPROPion (WELLBUTRIN) 100 MG tablet Take 100 mg by mouth 2 (two) times daily.      Marland Kitchen diltiazem (DILTIAZEM HCL CD) 360 MG 24 hr capsule Take 360 mg by mouth daily.      Marland Kitchen levothyroxine (SYNTHROID, LEVOTHROID) 25 MCG tablet Take 25 mcg by mouth daily.        . Multiple Vitamin (MULTIVITAMIN) capsule Take 1 capsule by mouth daily.        .  pravastatin (PRAVACHOL) 40 MG tablet Take 40 mg by mouth at bedtime.      Marland Kitchen tiotropium (SPIRIVA) 18 MCG inhalation capsule Place 1 capsule (18 mcg total) into inhaler and inhale daily.  90 capsule  3  . torsemide (DEMADEX) 20 MG tablet Take 20 mg by mouth Daily.      Marland Kitchen albuterol (PROVENTIL) (2.5 MG/3ML) 0.083% nebulizer solution USE 1 VIAL IN NEBULIZER THREE TIMES DAILY.  360 mL  5   Facility-Administered Medications Prior to Visit  Medication Dose Route Frequency Provider Last Rate Last Dose  . neomycin-polymyxin-dexameth (MAXITROL) 0.1 % ophth ointment    PRN Gemma Payor, MD   1 application at 09/19/12 1227      Review of Systems  Constitutional: Negative for fever and unexpected weight change.  HENT: Positive for congestion, sneezing and trouble swallowing. Negative for ear pain, nosebleeds, sore throat, rhinorrhea, dental problem, postnasal drip and sinus pressure.   Eyes: Positive for itching. Negative for redness.  Respiratory: Positive for cough and shortness of breath. Negative for chest tightness and  wheezing.   Cardiovascular: Positive for leg swelling. Negative for palpitations.  Gastrointestinal: Positive for nausea. Negative for vomiting.  Genitourinary: Negative for dysuria.  Musculoskeletal: Negative for joint swelling.  Skin: Negative for rash.  Neurological: Positive for headaches.  Hematological: Bruises/bleeds easily.  Psychiatric/Behavioral: Negative for dysphoric mood. The patient is nervous/anxious.        Objective:   Physical Exam  Vitals reviewed. Constitutional: She is oriented to person, place, and time. She appears well-developed and well-nourished. No distress.  HENT:  Head: Normocephalic and atraumatic.  Right Ear: External ear normal.  Left Ear: External ear normal.  Mouth/Throat: Oropharynx is clear and moist. No oropharyngeal exudate.  Eyes: Conjunctivae and EOM are normal. Pupils are equal, round, and reactive to light. Right eye exhibits no  discharge. Left eye exhibits no discharge. No scleral icterus.  Neck: Normal range of motion. Neck supple. No JVD present. No tracheal deviation present. No thyromegaly present.  Cardiovascular: Normal rate, regular rhythm, normal heart sounds and intact distal pulses.  Exam reveals no gallop and no friction rub.   No murmur heard. Pulmonary/Chest: Effort normal. No respiratory distress. She has wheezes. She has no rales. She exhibits no tenderness.  barrell chest + Purse lip breathing + Some wheze +  Abdominal: Soft. Bowel sounds are normal. She exhibits no distension and no mass. There is no tenderness. There is no rebound and no guarding.  Musculoskeletal: Normal range of motion. She exhibits no edema and no tenderness.  Lymphadenopathy:    She has no cervical adenopathy.  Neurological: She is alert and oriented to person, place, and time. She has normal reflexes. No cranial nerve deficit. She exhibits normal muscle tone. Coordination normal.  Skin: Skin is warm and dry. No rash noted. She is not diaphoretic. No erythema. No pallor.  Psychiatric: She has a normal mood and affect. Her behavior is normal. Judgment and thought content normal.  midlly anxious    BP 126/84  Pulse 93  Temp(Src) 97.4 F (36.3 C) (Oral)  Ht 5' 4.5" (1.638 m)  Wt 112 lb 6.4 oz (50.984 kg)  BMI 19 kg/m2  SpO2 91%        Assessment & Plan:   COPD Golds D, frequent exacerbations Gold stage D Copd , frequent exac , oxygen dependent Plan Start perforomist in nebulizer one twice daily Use albuterol on nebulizer as needed Take prednisone for 4 days Return 3 months     Updated Medication List Outpatient Encounter Prescriptions as of 02/03/2013  Medication Sig Dispense Refill  . albuterol (PROAIR HFA) 108 (90 BASE) MCG/ACT inhaler Inhale 2 puffs into the lungs every 6 (six) hours as needed. Shortness of breath      . albuterol (PROVENTIL) (2.5 MG/3ML) 0.083% nebulizer solution USE 1 VIAL IN NEBULIZER  THREE TIMES DAILY as needed  360 mL  5  . ALPRAZolam (XANAX) 0.25 MG tablet Take 1 tablet (0.25 mg total) by mouth 3 (three) times daily as needed for sleep or anxiety.  30 tablet  0  . buPROPion (WELLBUTRIN) 100 MG tablet Take 100 mg by mouth 2 (two) times daily.      . calcium-vitamin D (OSCAL) 250-125 MG-UNIT per tablet Take 1 tablet by mouth 2 (two) times daily.      Marland Kitchen diltiazem (DILTIAZEM HCL CD) 360 MG 24 hr capsule Take 360 mg by mouth daily.      Marland Kitchen levothyroxine (SYNTHROID, LEVOTHROID) 25 MCG tablet Take 25 mcg by mouth daily.        Marland Kitchen  Multiple Vitamin (MULTIVITAMIN) capsule Take 1 capsule by mouth daily.        . pravastatin (PRAVACHOL) 40 MG tablet Take 40 mg by mouth at bedtime.      Marland Kitchen tiotropium (SPIRIVA) 18 MCG inhalation capsule Place 1 capsule (18 mcg total) into inhaler and inhale daily.  90 capsule  3  . torsemide (DEMADEX) 20 MG tablet Take 20 mg by mouth Daily.      . [DISCONTINUED] albuterol (PROVENTIL) (2.5 MG/3ML) 0.083% nebulizer solution USE 1 VIAL IN NEBULIZER THREE TIMES DAILY.  360 mL  5  . formoterol (PERFOROMIST) 20 MCG/2ML nebulizer solution Take 2 mLs (20 mcg total) by nebulization 2 (two) times daily.  120 mL  6  . predniSONE (DELTASONE) 10 MG tablet Take 4 tablets daily for 5 days then stop  20 tablet  0   Facility-Administered Encounter Medications as of 02/03/2013  Medication Dose Route Frequency Provider Last Rate Last Dose  . neomycin-polymyxin-dexameth (MAXITROL) 0.1 % ophth ointment    PRN Gemma Payor, MD   1 application at 09/19/12 1227

## 2013-02-03 NOTE — Patient Instructions (Addendum)
Start perforomist in nebulizer one twice daily Use albuterol on nebulizer as needed Take prednisone for 4 days Return 3 months

## 2013-02-04 NOTE — Assessment & Plan Note (Signed)
Gold stage D Copd , frequent exac , oxygen dependent Plan Start perforomist in nebulizer one twice daily Use albuterol on nebulizer as needed Take prednisone for 4 days Return 3 months

## 2013-02-10 ENCOUNTER — Other Ambulatory Visit: Payer: Self-pay | Admitting: *Deleted

## 2013-02-10 MED ORDER — TIOTROPIUM BROMIDE MONOHYDRATE 18 MCG IN CAPS: 18.0000 ug | ORAL_CAPSULE | Freq: Every day | RESPIRATORY_TRACT | Status: DC

## 2013-02-17 ENCOUNTER — Telehealth: Payer: Self-pay | Admitting: Critical Care Medicine

## 2013-02-17 ENCOUNTER — Ambulatory Visit: Payer: Medicare Other | Admitting: Pulmonary Disease

## 2013-02-17 NOTE — Telephone Encounter (Signed)
I called and spoke with pt. i offered her appt to be seen. She scheduled to come in and see RA at 4:00. Nothing further was needed

## 2013-02-22 ENCOUNTER — Telehealth: Payer: Self-pay | Admitting: Pulmonary Disease

## 2013-02-22 ENCOUNTER — Encounter: Payer: Self-pay | Admitting: Pulmonary Disease

## 2013-02-22 NOTE — Telephone Encounter (Signed)
ATC line busy still wcb

## 2013-02-22 NOTE — Telephone Encounter (Signed)
ATC pt line busy x 4 wcb 

## 2013-02-23 MED ORDER — AZITHROMYCIN 250 MG PO TABS: ORAL_TABLET | ORAL | Status: DC

## 2013-02-23 MED ORDER — PREDNISONE 10 MG PO TABS: ORAL_TABLET | ORAL | Status: DC

## 2013-02-23 NOTE — Telephone Encounter (Signed)
Pt aware of recs. RX sent 

## 2013-02-23 NOTE — Telephone Encounter (Signed)
Per Dr. Delford Field: call in azithromycin 250 mg take 2 today then 1 daily until gone #6 no refills and a prednisone 10 mg take 4 x 2 days, 3 x 2 days, 2 x 2 days, and 1 x 2 days #20, no refills.  Thank you.

## 2013-02-23 NOTE — Telephone Encounter (Signed)
I spoke with [name] and she c/o cough w/ yellow phlem, chest congestion, wheezing, chest tx, increase SOB x 1 month. She is not taking anything currently except for what Dr. Delford Field prescribes for her. I offered her OV for [place] office today but stated she can't come in d/t transportation issues and it is hard for her since hs e lives in [place]. She is asking to have something called in. Please advise Dr. Delford Field thanks   No Known Allergies

## 2013-03-15 ENCOUNTER — Telehealth: Payer: Self-pay | Admitting: Critical Care Medicine

## 2013-03-15 ENCOUNTER — Ambulatory Visit (INDEPENDENT_AMBULATORY_CARE_PROVIDER_SITE_OTHER): Payer: Medicare Other | Admitting: Critical Care Medicine

## 2013-03-15 ENCOUNTER — Encounter: Payer: Self-pay | Admitting: Critical Care Medicine

## 2013-03-15 VITALS — BP 134/78 | HR 91 | Temp 98.0°F | Ht 60.0 in | Wt 111.0 lb

## 2013-03-15 DIAGNOSIS — J449 Chronic obstructive pulmonary disease, unspecified: Secondary | ICD-10-CM

## 2013-03-15 MED ORDER — AZITHROMYCIN 250 MG PO TABS: 250.0000 mg | ORAL_TABLET | Freq: Every day | ORAL | Status: DC

## 2013-03-15 MED ORDER — FORMOTEROL FUMARATE 20 MCG/2ML IN NEBU: 20.0000 ug | INHALATION_SOLUTION | Freq: Two times a day (BID) | RESPIRATORY_TRACT | Status: DC

## 2013-03-15 MED ORDER — PREDNISONE 10 MG PO TABS: ORAL_TABLET | ORAL | Status: DC

## 2013-03-15 NOTE — Telephone Encounter (Signed)
I spoke with pt. She stated the perfomists [place] going to cost her $600. She stated she has not had to get this filled last time bc she was getting samples. Per pt she needs alternative bc she can not pay this much for this medication. Please advise Dr. Delford Field thanks  No Known Allergies

## 2013-03-15 NOTE — Telephone Encounter (Signed)
I spoke with pt and made aware of recs. She stated she will use the albuterol. Nothing further was needed

## 2013-03-15 NOTE — Progress Notes (Signed)
Subjective:    Patient ID: [name], female    DOB: [birth year], 72 y.o.   MRN: [id number]  HPI  03/15/2013 Chief Complaint  Patient presents with  . Follow-up    Breathing has improved since last OV but still has DOE.  Reports chest congestion and prod cough with a lot of white to pale yellow mucus x 2 wks, wheezing, and chest tightness/pain at times.  Pt notes more congestion and deep cough.  Mucus is thick yellow.  No real chest pain.  No real heartburn.  Notes some chest tightness.  No fever.  Notes some qhs dyspnea.   Past Medical History  Diagnosis Date  . COPD (chronic obstructive pulmonary disease)   . Hypertension   . Hyperlipidemia   . History of tobacco abuse   . Mild depression   . Hypothyroidism      Family History  Problem Relation Age of Onset  . Allergies Father   . Heart disease Mother   . Rheum arthritis Mother   . Breast cancer Daughter   . Breast cancer Sister   . Cancer Maternal Aunt      History   Social History  . Marital Status: Married    Spouse Name: N/A    Number of Children: 2  . Years of Education: N/A   Occupational History  . retired     Interior and spatial designer   Social History Main Topics  . Smoking status: Former Smoker -- 1.00 packs/day for 50 years    Types: Cigarettes    Quit date: 09/07/2008  . Smokeless tobacco: Never Used  . Alcohol Use: No  . Drug Use: No  . Sexually Active: Not on file   Other Topics Concern  . Not on file   Social History Narrative  . No narrative on file     No Known Allergies   Outpatient Prescriptions Prior to Visit  Medication Sig Dispense Refill  . albuterol (PROAIR HFA) 108 (90 BASE) MCG/ACT inhaler Inhale 2 puffs into the lungs every 6 (six) hours as needed. Shortness of breath      . albuterol (PROVENTIL) (2.5 MG/3ML) 0.083% nebulizer solution USE 1 VIAL IN NEBULIZER THREE TIMES DAILY as needed  360 mL  5  . ALPRAZolam (XANAX) 0.25 MG tablet Take 1 tablet (0.25 mg total) by mouth 3 (three)  times daily as needed for sleep or anxiety.  30 tablet  0  . buPROPion (WELLBUTRIN) 100 MG tablet Take 100 mg by mouth 2 (two) times daily.      . calcium-vitamin D (OSCAL) 250-125 MG-UNIT per tablet Take 1 tablet by mouth 2 (two) times daily.      Marland Kitchen diltiazem (DILTIAZEM HCL CD) 360 MG 24 hr capsule Take 360 mg by mouth daily.      Marland Kitchen levothyroxine (SYNTHROID, LEVOTHROID) 25 MCG tablet Take 25 mcg by mouth daily.        . Multiple Vitamin (MULTIVITAMIN) capsule Take 1 capsule by mouth daily.        . pravastatin (PRAVACHOL) 40 MG tablet Take 40 mg by mouth at bedtime.      Marland Kitchen tiotropium (SPIRIVA) 18 MCG inhalation capsule Place 1 capsule (18 mcg total) into inhaler and inhale daily.  90 capsule  3  . torsemide (DEMADEX) 20 MG tablet Take 20 mg by mouth Daily.      . formoterol (PERFOROMIST) 20 MCG/2ML nebulizer solution Take 2 mLs (20 mcg total) by nebulization 2 (two) times daily.  120 mL  6  .  azithromycin (ZITHROMAX) 250 MG tablet Take 2 today then 1 daily until gone  6 tablet  0  . predniSONE (DELTASONE) 10 MG tablet Take 4 tablets daily for 5 days then stop  20 tablet  0  . predniSONE (DELTASONE) 10 MG tablet Take 4 tabs daily x 2 days, 3 tabs daily x 2 days, 2 tabs daily x 2 days, 1 tab daily x 2 days then stop  20 tablet  0   Facility-Administered Medications Prior to Visit  Medication Dose Route Frequency Provider Last Rate Last Dose  . neomycin-polymyxin-dexameth (MAXITROL) 0.1 % ophth ointment    PRN Gemma Payor, MD   1 application at 09/19/12 1227      Review of Systems  Constitutional: Negative for fever and unexpected weight change.  HENT: Positive for trouble swallowing. Negative for ear pain, nosebleeds, congestion, sore throat, rhinorrhea, sneezing, dental problem, postnasal drip and sinus pressure.   Eyes: Positive for itching. Negative for redness.  Respiratory: Positive for cough and shortness of breath. Negative for chest tightness and wheezing.   Cardiovascular: Negative  for palpitations and leg swelling.  Gastrointestinal: Positive for nausea. Negative for vomiting.  Genitourinary: Negative for dysuria.  Musculoskeletal: Negative for joint swelling.  Skin: Negative for rash.  Neurological: Positive for headaches.  Hematological: Bruises/bleeds easily.  Psychiatric/Behavioral: Negative for dysphoric mood. The patient is nervous/anxious.        Objective:   Physical Exam  Vitals reviewed. Constitutional: She is oriented to person, place, and time. She appears well-developed and well-nourished. No distress.  HENT:  Head: Normocephalic and atraumatic.  Right Ear: External ear normal.  Left Ear: External ear normal.  Mouth/Throat: Oropharynx is clear and moist. No oropharyngeal exudate.  Eyes: Conjunctivae and EOM are normal. Pupils are equal, round, and reactive to light. Right eye exhibits no discharge. Left eye exhibits no discharge. No scleral icterus.  Neck: Normal range of motion. Neck supple. No JVD present. No tracheal deviation present. No thyromegaly present.  Cardiovascular: Normal rate, regular rhythm, normal heart sounds and intact distal pulses.  Exam reveals no gallop and no friction rub.   No murmur heard. Pulmonary/Chest: Effort normal. No respiratory distress. She has wheezes. She has no rales. She exhibits no tenderness.  barrell chest + Purse lip breathing + Some wheze +  Abdominal: Soft. Bowel sounds are normal. She exhibits no distension and no mass. There is no tenderness. There is no rebound and no guarding.  Musculoskeletal: Normal range of motion. She exhibits no edema and no tenderness.  Lymphadenopathy:    She has no cervical adenopathy.  Neurological: She is alert and oriented to person, place, and time. She has normal reflexes. No cranial nerve deficit. She exhibits normal muscle tone. Coordination normal.  Skin: Skin is warm and dry. No rash noted. She is not diaphoretic. No erythema. No pallor.  Psychiatric: She has a  normal mood and affect. Her behavior is normal. Judgment and thought content normal.  midlly anxious    BP 134/78  Pulse 91  Temp(Src) 98 F (36.7 C) (Oral)  Ht 5' (1.524 m)  Wt 111 lb (50.349 kg)  BMI 21.68 kg/m2  SpO2 94%        Assessment & Plan:   COPD Golds D, frequent exacerbations COPD gold stage D. with frequent exacerbations and failure to tolerate Daliresp Current exacerbation that has recurred Plan REsume perforomist one in nebulizer twice daily if the patient can afford the co-pay if not she will have to use albuterol  3-4 times daily in the nebulizer Prednisone 10mg  Take 4 tablets daily for 5 days then stop Azithromycin 250mg  Take two once then one daily until gone Stay on spiriva Return 6 weeks     Updated Medication List Outpatient Encounter Prescriptions as of 03/15/2013  Medication Sig Dispense Refill  . albuterol (PROAIR HFA) 108 (90 BASE) MCG/ACT inhaler Inhale 2 puffs into the lungs every 6 (six) hours as needed. Shortness of breath      . albuterol (PROVENTIL) (2.5 MG/3ML) 0.083% nebulizer solution USE 1 VIAL IN NEBULIZER THREE TIMES DAILY as needed  360 mL  5  . ALPRAZolam (XANAX) 0.25 MG tablet Take 1 tablet (0.25 mg total) by mouth 3 (three) times daily as needed for sleep or anxiety.  30 tablet  0  . buPROPion (WELLBUTRIN) 100 MG tablet Take 100 mg by mouth 2 (two) times daily.      . calcium-vitamin D (OSCAL) 250-125 MG-UNIT per tablet Take 1 tablet by mouth 2 (two) times daily.      Marland Kitchen diltiazem (DILTIAZEM HCL CD) 360 MG 24 hr capsule Take 360 mg by mouth daily.      . formoterol (PERFOROMIST) 20 MCG/2ML nebulizer solution Take 2 mLs (20 mcg total) by nebulization 2 (two) times daily.  120 mL  6  . levothyroxine (SYNTHROID, LEVOTHROID) 25 MCG tablet Take 25 mcg by mouth daily.        Marland Kitchen loratadine (CLARITIN) 10 MG tablet Take 10 mg by mouth daily.      . Multiple Vitamin (MULTIVITAMIN) capsule Take 1 capsule by mouth daily.        . pravastatin  (PRAVACHOL) 40 MG tablet Take 40 mg by mouth at bedtime.      Marland Kitchen tiotropium (SPIRIVA) 18 MCG inhalation capsule Place 1 capsule (18 mcg total) into inhaler and inhale daily.  90 capsule  3  . torsemide (DEMADEX) 20 MG tablet Take 20 mg by mouth Daily.      . [DISCONTINUED] formoterol (PERFOROMIST) 20 MCG/2ML nebulizer solution Take 2 mLs (20 mcg total) by nebulization 2 (two) times daily.  120 mL  6  . azithromycin (ZITHROMAX) 250 MG tablet Take 1 tablet (250 mg total) by mouth daily. Take two once then one daily until gone  6 each  0  . predniSONE (DELTASONE) 10 MG tablet Take 4 tablets daily for 5 days then stop  20 tablet  0  . [DISCONTINUED] azithromycin (ZITHROMAX) 250 MG tablet Take 2 today then 1 daily until gone  6 tablet  0  . [DISCONTINUED] predniSONE (DELTASONE) 10 MG tablet Take 4 tablets daily for 5 days then stop  20 tablet  0  . [DISCONTINUED] predniSONE (DELTASONE) 10 MG tablet Take 4 tabs daily x 2 days, 3 tabs daily x 2 days, 2 tabs daily x 2 days, 1 tab daily x 2 days then stop  20 tablet  0   Facility-Administered Encounter Medications as of 03/15/2013  Medication Dose Route Frequency Provider Last Rate Last Dose  . neomycin-polymyxin-dexameth (MAXITROL) 0.1 % ophth ointment    PRN Gemma Payor, MD   1 application at 09/19/12 1227

## 2013-03-15 NOTE — Telephone Encounter (Signed)
Only alternative is to go to albuterol in nebulizer 3-4 times daily

## 2013-03-15 NOTE — Patient Instructions (Addendum)
REsume perforomist one in nebulizer twice daily Prednisone 10mg  Take 4 tablets daily for 5 days then stop Azithromycin 250mg  Take two once then one daily until gone Stay on spiriva Return 6 weeks

## 2013-03-16 NOTE — Assessment & Plan Note (Signed)
COPD gold stage D. with frequent exacerbations and failure to tolerate Daliresp Current exacerbation that has recurred Plan REsume perforomist one in nebulizer twice daily if the patient can afford the co-pay if not she will have to use albuterol 3-4 times daily in the nebulizer Prednisone 10mg  Take 4 tablets daily for 5 days then stop Azithromycin 250mg  Take two once then one daily until gone Stay on spiriva Return 6 weeks

## 2013-04-11 ENCOUNTER — Encounter: Payer: Self-pay | Admitting: *Deleted

## 2013-04-12 ENCOUNTER — Other Ambulatory Visit: Payer: Self-pay

## 2013-04-14 ENCOUNTER — Ambulatory Visit: Payer: Medicare Other | Admitting: Internal Medicine

## 2013-04-19 ENCOUNTER — Other Ambulatory Visit (HOSPITAL_COMMUNITY): Payer: Self-pay | Admitting: Pulmonary Disease

## 2013-04-19 DIAGNOSIS — Z139 Encounter for screening, unspecified: Secondary | ICD-10-CM

## 2013-04-19 DIAGNOSIS — R5381 Other malaise: Secondary | ICD-10-CM | POA: Diagnosis not present

## 2013-04-19 DIAGNOSIS — E039 Hypothyroidism, unspecified: Secondary | ICD-10-CM | POA: Diagnosis not present

## 2013-04-19 DIAGNOSIS — I10 Essential (primary) hypertension: Secondary | ICD-10-CM | POA: Diagnosis not present

## 2013-04-19 DIAGNOSIS — J449 Chronic obstructive pulmonary disease, unspecified: Secondary | ICD-10-CM | POA: Diagnosis not present

## 2013-04-19 DIAGNOSIS — R5383 Other fatigue: Secondary | ICD-10-CM | POA: Diagnosis not present

## 2013-04-20 DIAGNOSIS — R5383 Other fatigue: Secondary | ICD-10-CM | POA: Diagnosis not present

## 2013-04-20 DIAGNOSIS — E039 Hypothyroidism, unspecified: Secondary | ICD-10-CM | POA: Diagnosis not present

## 2013-04-20 DIAGNOSIS — I10 Essential (primary) hypertension: Secondary | ICD-10-CM | POA: Diagnosis not present

## 2013-04-25 ENCOUNTER — Encounter: Payer: Self-pay | Admitting: Internal Medicine

## 2013-04-25 ENCOUNTER — Ambulatory Visit (INDEPENDENT_AMBULATORY_CARE_PROVIDER_SITE_OTHER): Payer: Medicare Other | Admitting: Internal Medicine

## 2013-04-25 VITALS — BP 144/92 | HR 105 | Ht 60.0 in | Wt 113.1 lb

## 2013-04-25 DIAGNOSIS — J449 Chronic obstructive pulmonary disease, unspecified: Secondary | ICD-10-CM

## 2013-04-25 DIAGNOSIS — E785 Hyperlipidemia, unspecified: Secondary | ICD-10-CM

## 2013-04-25 DIAGNOSIS — I10 Essential (primary) hypertension: Secondary | ICD-10-CM

## 2013-04-25 NOTE — Progress Notes (Signed)
OFFICE NOTE  Chief Complaint:  Routine followup  Primary Care Physician: Fredirick Maudlin, MD  HPI:  [name]  is a 72 year old female with history of severe COPD on home oxygen, hypertension, dyslipidemia, some diastolic dysfunction. She has had no more problems with lower extremity swelling. Blood pressure has been well controlled and although she continues to remain on oxygen and have severe COPD, she is fairly stable. Her weight is maintained about the same. Heart rate is stable and blood pressure is okay. She has not been admitted in the hospital last 6 months and actually is doing fairly well on her current regimen of medications. Interestingly, she has been able to put on a little bit of weight to which he need to do.  PMHx:  Past Medical History  Diagnosis Date  . COPD (chronic obstructive pulmonary disease)     home O2  . Hypertension   . Hyperlipidemia   . History of tobacco abuse   . Mild depression   . Hypothyroidism   . Diastolic dysfunction   . History of nuclear stress test 09/23/2009    dipyridamole; normal pattern of perfusion; low risk scan     Past Surgical History  Procedure Laterality Date  . Gallbladder surgery  1977  . Total abdominal hysterectomy  1980's  . Cataract extraction w/phaco  09/08/2012    Procedure: CATARACT EXTRACTION PHACO AND INTRAOCULAR LENS PLACEMENT (IOC);  Surgeon: Gemma Payor, MD;  Location: [facility];  Service: Ophthalmology;  Laterality: Right;  CDE:19.67  . Tonsillectomy    . Right carpal tunnel release    . Cataract extraction w/phaco  09/19/2012    Procedure: CATARACT EXTRACTION PHACO AND INTRAOCULAR LENS PLACEMENT (IOC);  Surgeon: Gemma Payor, MD;  Location: [facility];  Service: Ophthalmology;  Laterality: Left;  CDE:19.83  . Transthoracic echocardiogram  09/23/2009    EF>55%; trace TR; normal LA & RA size; normal LV & RV size & systolic function     FAMHx:  Family History  Problem Relation Age of Onset  . Allergies Father   .  Heart disease Mother   . Rheum arthritis Mother   . Breast cancer Daughter   . Breast cancer Sister   . Cancer Maternal Aunt   . Hypertension Mother   . Stroke Maternal Grandmother   . Stroke Sister     SOCHx:   reports that she quit smoking about 4 years ago. Her smoking use included Cigarettes. She has a 50 pack-year smoking history. She has never used smokeless tobacco. She reports that she does not drink alcohol or use illicit drugs.  ALLERGIES:  No Known Allergies  ROS: A comprehensive review of systems was negative except for: Respiratory: positive for dyspnea on exertion and on oxygen  HOME MEDS: Current Outpatient Prescriptions  Medication Sig Dispense Refill  . albuterol (PROAIR HFA) 108 (90 BASE) MCG/ACT inhaler Inhale 2 puffs into the lungs every 6 (six) hours as needed. Shortness of breath      . albuterol (PROVENTIL) (2.5 MG/3ML) 0.083% nebulizer solution USE 1 VIAL IN NEBULIZER THREE TIMES DAILY as needed  360 mL  5  . ALPRAZolam (XANAX) 0.25 MG tablet Take 1 tablet (0.25 mg total) by mouth 3 (three) times daily as needed for sleep or anxiety.  30 tablet  0  . buPROPion (WELLBUTRIN) 100 MG tablet Take 100 mg by mouth 2 (two) times daily.      . calcium-vitamin D (OSCAL) 250-125 MG-UNIT per tablet Take 1 tablet by mouth 2 (two)  times daily.      Marland Kitchen diltiazem (DILTIAZEM HCL CD) 360 MG 24 hr capsule Take 360 mg by mouth daily.      . formoterol (PERFOROMIST) 20 MCG/2ML nebulizer solution Take 2 mLs (20 mcg total) by nebulization 2 (two) times daily.  120 mL  6  . levothyroxine (SYNTHROID, LEVOTHROID) 25 MCG tablet Take 25 mcg by mouth daily.        Marland Kitchen loratadine (CLARITIN) 10 MG tablet Take 10 mg by mouth daily.      . Multiple Vitamin (MULTIVITAMIN) capsule Take 1 capsule by mouth daily.        . OXYGEN-HELIUM IN Inhale 2 L into the lungs continuous.      . pravastatin (PRAVACHOL) 40 MG tablet Take 40 mg by mouth at bedtime.      Marland Kitchen tiotropium (SPIRIVA) 18 MCG inhalation  capsule Place 1 capsule (18 mcg total) into inhaler and inhale daily.  90 capsule  3  . torsemide (DEMADEX) 20 MG tablet Take 20 mg by mouth Daily.       No current facility-administered medications for this visit.   Facility-Administered Medications Ordered in Other Visits  Medication Dose Route Frequency Provider Last Rate Last Dose  . neomycin-polymyxin-dexameth (MAXITROL) 0.1 % ophth ointment    PRN Gemma Payor, MD   1 application at 09/19/12 1227    LABS/IMAGING: No results found for this or any previous visit (from the past 48 hour(s)). No results found.  VITALS: BP 144/92  Pulse 105  Ht 5' (1.524 m)  Wt 113 lb 1.6 oz (51.302 kg)  BMI 22.09 kg/m2  SpO2 94%  EXAM: General appearance: alert and no distress Neck: no adenopathy, no carotid bruit, no JVD, supple, symmetrical, trachea midline and thyroid not enlarged, symmetric, no tenderness/mass/nodules Lungs: diminished breath sounds bilaterally Heart: regular rate and rhythm, S1, S2 normal, no murmur, click, rub or gallop Abdomen: soft, non-tender; bowel sounds normal; no masses,  no organomegaly Extremities: extremities normal, atraumatic, no cyanosis or edema Pulses: 2+ and symmetric Skin: Skin color, texture, turgor normal. No rashes or lesions Neurologic: Grossly normal  EKG: Sinus tachycardia 105  ASSESSMENT: 1. Severe COPD on continuous oxygen 2. Hypertension 3. Dyslipidemia  PLAN: 1.   [name] is doing fairly well and is in no recent COPD exacerbations. She's managed to put on a little weight which is good. Her blood pressure is fairly well-controlled and she is having no anginal symptoms. We can see her back annually or sooner as necessary.  Chrystie Nose, MD, [facility] Attending Cardiologist [facility]  Alexsis Branscom C 04/25/2013, 11:17 AM

## 2013-04-25 NOTE — Patient Instructions (Addendum)
Your physician wants you to follow-up in: 1 year. You will receive a reminder letter in the mail two months in advance. If you don't receive a letter, please call our office to schedule the follow-up appointment.  

## 2013-04-27 ENCOUNTER — Ambulatory Visit (HOSPITAL_COMMUNITY)
Admission: RE | Admit: 2013-04-27 | Discharge: 2013-04-27 | Disposition: A | Discharge status: Home / Self Care | Payer: Medicare Other | Source: Ambulatory Visit | Attending: Pulmonary Disease | Admitting: Pulmonary Disease

## 2013-04-27 DIAGNOSIS — Z1231 Encounter for screening mammogram for malignant neoplasm of breast: Secondary | ICD-10-CM | POA: Diagnosis not present

## 2013-04-27 DIAGNOSIS — Z139 Encounter for screening, unspecified: Secondary | ICD-10-CM

## 2013-05-02 ENCOUNTER — Ambulatory Visit: Payer: Medicare Other | Admitting: Critical Care Medicine

## 2013-05-10 ENCOUNTER — Encounter: Payer: Self-pay | Admitting: Critical Care Medicine

## 2013-05-10 ENCOUNTER — Ambulatory Visit (INDEPENDENT_AMBULATORY_CARE_PROVIDER_SITE_OTHER): Payer: Medicare Other | Admitting: Critical Care Medicine

## 2013-05-10 VITALS — BP 138/76 | HR 93 | Temp 98.1°F | Ht 60.0 in | Wt 116.8 lb

## 2013-05-10 DIAGNOSIS — J449 Chronic obstructive pulmonary disease, unspecified: Secondary | ICD-10-CM | POA: Diagnosis not present

## 2013-05-10 DIAGNOSIS — Z23 Encounter for immunization: Secondary | ICD-10-CM

## 2013-05-10 DIAGNOSIS — J4489 Other specified chronic obstructive pulmonary disease: Secondary | ICD-10-CM

## 2013-05-10 NOTE — Progress Notes (Signed)
Subjective:    Patient ID: [name], female    DOB: 06/25/1941, 72 y.o.   MRN: [id number]  HPI  05/10/2013 Chief Complaint  Patient presents with  . 6 wk follow up    Breathing is unchanged.  Has good days and bad days, wheezing, chest tightness, and coughing some over the past few wks.  Cough is prod with small amount of clear mucus.  Also, has trouble swallowing over the past month and feels like food gets stuck.  Dyspnea about the same.  Pt notes some good and bad days. Coughs up small amount of mucus.  Mucus is clear and thin. Notes some wheezing.  Notes significant dyspnea at night. Pt notes more food gets in throat and gets stuck. No prior dx of esophageal issues.  PCP is Juanetta Gosling    Past Medical History  Diagnosis Date  . COPD (chronic obstructive pulmonary disease)     home O2  . Hypertension   . Hyperlipidemia   . History of tobacco abuse   . Mild depression   . Hypothyroidism   . Diastolic dysfunction   . History of nuclear stress test 09/23/2009    dipyridamole; normal pattern of perfusion; low risk scan      Family History  Problem Relation Age of Onset  . Allergies Father   . Heart disease Mother   . Rheum arthritis Mother   . Breast cancer Daughter   . Breast cancer Sister   . Cancer Maternal Aunt   . Hypertension Mother   . Stroke Maternal Grandmother   . Stroke Sister      History   Social History  . Marital Status: Married    Spouse Name: N/A    Number of Children: 2  . Years of Education: N/A   Occupational History  . retired     Interior and spatial designer   Social History Main Topics  . Smoking status: Former Smoker -- 1.00 packs/day for 50 years    Types: Cigarettes    Quit date: 09/07/2008  . Smokeless tobacco: Never Used  . Alcohol Use: No  . Drug Use: No  . Sexual Activity: Not on file   Other Topics Concern  . Not on file   Social History Narrative  . No narrative on file     No Known Allergies   Outpatient Prescriptions Prior to  Visit  Medication Sig Dispense Refill  . albuterol (PROAIR HFA) 108 (90 BASE) MCG/ACT inhaler Inhale 2 puffs into the lungs every 6 (six) hours as needed. Shortness of breath      . albuterol (PROVENTIL) (2.5 MG/3ML) 0.083% nebulizer solution USE 1 VIAL IN NEBULIZER THREE TIMES DAILY as needed  360 mL  5  . ALPRAZolam (XANAX) 0.25 MG tablet Take 1 tablet (0.25 mg total) by mouth 3 (three) times daily as needed for sleep or anxiety.  30 tablet  0  . buPROPion (WELLBUTRIN) 100 MG tablet Take 100 mg by mouth 2 (two) times daily.      . calcium-vitamin D (OSCAL) 250-125 MG-UNIT per tablet Take 1 tablet by mouth 2 (two) times daily.      Marland Kitchen diltiazem (DILTIAZEM HCL CD) 360 MG 24 hr capsule Take 360 mg by mouth daily.      . formoterol (PERFOROMIST) 20 MCG/2ML nebulizer solution Take 2 mLs (20 mcg total) by nebulization 2 (two) times daily.  120 mL  6  . levothyroxine (SYNTHROID, LEVOTHROID) 25 MCG tablet Take 25 mcg by mouth daily.        Marland Kitchen  loratadine (CLARITIN) 10 MG tablet Take 10 mg by mouth daily.      . Multiple Vitamin (MULTIVITAMIN) capsule Take 1 capsule by mouth daily.        . OXYGEN-HELIUM IN Inhale 2 L into the lungs continuous.      . pravastatin (PRAVACHOL) 40 MG tablet Take 40 mg by mouth at bedtime.      Marland Kitchen tiotropium (SPIRIVA) 18 MCG inhalation capsule Place 1 capsule (18 mcg total) into inhaler and inhale daily.  90 capsule  3  . torsemide (DEMADEX) 20 MG tablet Take 20 mg by mouth Daily.       Facility-Administered Medications Prior to Visit  Medication Dose Route Frequency Provider Last Rate Last Dose  . neomycin-polymyxin-dexameth (MAXITROL) 0.1 % ophth ointment    PRN Gemma Payor, MD   1 application at 09/19/12 1227      Review of Systems  Constitutional: Negative for fever and unexpected weight change.  HENT: Positive for trouble swallowing. Negative for ear pain, nosebleeds, congestion, sore throat, rhinorrhea, sneezing, dental problem, postnasal drip and sinus pressure.    Eyes: Positive for itching. Negative for redness.  Respiratory: Positive for cough and shortness of breath. Negative for chest tightness and wheezing.   Cardiovascular: Negative for palpitations and leg swelling.  Gastrointestinal: Positive for nausea. Negative for vomiting.  Genitourinary: Negative for dysuria.  Musculoskeletal: Negative for joint swelling.  Skin: Negative for rash.  Neurological: Positive for headaches.  Hematological: Bruises/bleeds easily.  Psychiatric/Behavioral: Negative for dysphoric mood. The patient is nervous/anxious.        Objective:   Physical Exam  Vitals reviewed. Constitutional: She is oriented to person, place, and time. She appears well-developed and well-nourished. No distress.  HENT:  Head: Normocephalic and atraumatic.  Right Ear: External ear normal.  Left Ear: External ear normal.  Mouth/Throat: Oropharynx is clear and moist. No oropharyngeal exudate.  Eyes: Conjunctivae and EOM are normal. Pupils are equal, round, and reactive to light. Right eye exhibits no discharge. Left eye exhibits no discharge. No scleral icterus.  Neck: Normal range of motion. Neck supple. No JVD present. No tracheal deviation present. No thyromegaly present.  Cardiovascular: Normal rate, regular rhythm, normal heart sounds and intact distal pulses.  Exam reveals no gallop and no friction rub.   No murmur heard. Pulmonary/Chest: Effort normal. No respiratory distress. She has wheezes. She has no rales. She exhibits no tenderness.  barrell chest + Purse lip breathing + Some wheze +  Abdominal: Soft. Bowel sounds are normal. She exhibits no distension and no mass. There is no tenderness. There is no rebound and no guarding.  Musculoskeletal: Normal range of motion. She exhibits no edema and no tenderness.  Lymphadenopathy:    She has no cervical adenopathy.  Neurological: She is alert and oriented to person, place, and time. She has normal reflexes. No cranial nerve  deficit. She exhibits normal muscle tone. Coordination normal.  Skin: Skin is warm and dry. No rash noted. She is not diaphoretic. No erythema. No pallor.  Psychiatric: She has a normal mood and affect. Her behavior is normal. Judgment and thought content normal.  midlly anxious    BP 138/76  Pulse 93  Temp(Src) 98.1 F (36.7 C) (Oral)  Ht 5' (1.524 m)  Wt 116 lb 12.8 oz (52.98 kg)  BMI 22.81 kg/m2  SpO2 94%        Assessment & Plan:   COPD Golds D, frequent exacerbations Gold stage D. COPD with frequent exacerbations stable at  this time Plan Flu vaccine was administered Note needing to increase oxygen to 4 L continuous and 2 L rest    Updated Medication List Outpatient Encounter Prescriptions as of 05/10/2013  Medication Sig Dispense Refill  . albuterol (PROAIR HFA) 108 (90 BASE) MCG/ACT inhaler Inhale 2 puffs into the lungs every 6 (six) hours as needed. Shortness of breath      . albuterol (PROVENTIL) (2.5 MG/3ML) 0.083% nebulizer solution USE 1 VIAL IN NEBULIZER THREE TIMES DAILY as needed  360 mL  5  . ALPRAZolam (XANAX) 0.25 MG tablet Take 1 tablet (0.25 mg total) by mouth 3 (three) times daily as needed for sleep or anxiety.  30 tablet  0  . buPROPion (WELLBUTRIN) 100 MG tablet Take 100 mg by mouth 2 (two) times daily.      . calcium-vitamin D (OSCAL) 250-125 MG-UNIT per tablet Take 1 tablet by mouth 2 (two) times daily.      Marland Kitchen diltiazem (DILTIAZEM HCL CD) 360 MG 24 hr capsule Take 360 mg by mouth daily.      . formoterol (PERFOROMIST) 20 MCG/2ML nebulizer solution Take 2 mLs (20 mcg total) by nebulization 2 (two) times daily.  120 mL  6  . levothyroxine (SYNTHROID, LEVOTHROID) 25 MCG tablet Take 25 mcg by mouth daily.        Marland Kitchen loratadine (CLARITIN) 10 MG tablet Take 10 mg by mouth daily.      . Multiple Vitamin (MULTIVITAMIN) capsule Take 1 capsule by mouth daily.        . OXYGEN-HELIUM IN Inhale 2 L into the lungs continuous.      . pravastatin (PRAVACHOL) 40 MG  tablet Take 40 mg by mouth at bedtime.      Marland Kitchen tiotropium (SPIRIVA) 18 MCG inhalation capsule Place 1 capsule (18 mcg total) into inhaler and inhale daily.  90 capsule  3  . torsemide (DEMADEX) 20 MG tablet Take 20 mg by mouth Daily.       Facility-Administered Encounter Medications as of 05/10/2013  Medication Dose Route Frequency Provider Last Rate Last Dose  . neomycin-polymyxin-dexameth (MAXITROL) 0.1 % ophth ointment    PRN Gemma Payor, MD   1 application at 09/19/12 1227

## 2013-05-10 NOTE — Assessment & Plan Note (Addendum)
Gold stage D. COPD with frequent exacerbations stable at this time Plan Flu vaccine was administered Note needing to increase oxygen to 4 L continuous and 2 L rest

## 2013-05-10 NOTE — Patient Instructions (Addendum)
No change in medications A Flu vaccine was given Use 4Liters oxygen with exertion, 2Liter rest Return 4 months

## 2013-08-25 ENCOUNTER — Other Ambulatory Visit: Payer: Self-pay | Admitting: Critical Care Medicine

## 2013-09-05 DIAGNOSIS — J449 Chronic obstructive pulmonary disease, unspecified: Secondary | ICD-10-CM | POA: Diagnosis not present

## 2013-09-05 DIAGNOSIS — E039 Hypothyroidism, unspecified: Secondary | ICD-10-CM | POA: Diagnosis not present

## 2013-09-05 DIAGNOSIS — I10 Essential (primary) hypertension: Secondary | ICD-10-CM | POA: Diagnosis not present

## 2013-09-05 DIAGNOSIS — F411 Generalized anxiety disorder: Secondary | ICD-10-CM | POA: Diagnosis not present

## 2013-09-12 ENCOUNTER — Ambulatory Visit (HOSPITAL_COMMUNITY)
Admission: RE | Admit: 2013-09-12 | Discharge: 2013-09-12 | Disposition: A | Discharge status: Home / Self Care | Payer: Medicare Other | Source: Ambulatory Visit | Attending: Pulmonary Disease | Admitting: Pulmonary Disease

## 2013-09-12 ENCOUNTER — Other Ambulatory Visit (HOSPITAL_COMMUNITY): Payer: Self-pay | Admitting: Pulmonary Disease

## 2013-09-12 DIAGNOSIS — R059 Cough, unspecified: Secondary | ICD-10-CM

## 2013-09-12 DIAGNOSIS — R0602 Shortness of breath: Secondary | ICD-10-CM | POA: Insufficient documentation

## 2013-09-12 DIAGNOSIS — R05 Cough: Secondary | ICD-10-CM

## 2013-09-12 DIAGNOSIS — R0989 Other specified symptoms and signs involving the circulatory and respiratory systems: Secondary | ICD-10-CM | POA: Insufficient documentation

## 2013-09-12 DIAGNOSIS — J449 Chronic obstructive pulmonary disease, unspecified: Secondary | ICD-10-CM | POA: Diagnosis not present

## 2013-09-12 DIAGNOSIS — J438 Other emphysema: Secondary | ICD-10-CM | POA: Diagnosis not present

## 2013-09-12 DIAGNOSIS — I779 Disorder of arteries and arterioles, unspecified: Secondary | ICD-10-CM | POA: Diagnosis not present

## 2013-09-12 DIAGNOSIS — J4489 Other specified chronic obstructive pulmonary disease: Secondary | ICD-10-CM | POA: Insufficient documentation

## 2013-09-12 DIAGNOSIS — J209 Acute bronchitis, unspecified: Secondary | ICD-10-CM | POA: Diagnosis not present

## 2013-09-13 DIAGNOSIS — F411 Generalized anxiety disorder: Secondary | ICD-10-CM | POA: Diagnosis not present

## 2013-09-13 DIAGNOSIS — J209 Acute bronchitis, unspecified: Secondary | ICD-10-CM | POA: Diagnosis not present

## 2013-09-13 DIAGNOSIS — J449 Chronic obstructive pulmonary disease, unspecified: Secondary | ICD-10-CM | POA: Diagnosis not present

## 2013-10-02 ENCOUNTER — Encounter: Payer: Self-pay | Admitting: Critical Care Medicine

## 2013-10-02 ENCOUNTER — Ambulatory Visit (INDEPENDENT_AMBULATORY_CARE_PROVIDER_SITE_OTHER): Payer: Medicare Other | Admitting: Critical Care Medicine

## 2013-10-02 ENCOUNTER — Encounter (INDEPENDENT_AMBULATORY_CARE_PROVIDER_SITE_OTHER): Payer: Self-pay

## 2013-10-02 VITALS — BP 142/68 | HR 84 | Temp 98.4°F | Ht 60.0 in | Wt 109.6 lb

## 2013-10-02 DIAGNOSIS — Z23 Encounter for immunization: Secondary | ICD-10-CM | POA: Diagnosis not present

## 2013-10-02 DIAGNOSIS — J449 Chronic obstructive pulmonary disease, unspecified: Secondary | ICD-10-CM | POA: Diagnosis not present

## 2013-10-02 NOTE — Patient Instructions (Addendum)
No change in medications. Prevnar 13 vaccine was given Return in     4 months

## 2013-10-02 NOTE — Assessment & Plan Note (Signed)
Gold stage D. COPD with frequent exacerbations now stable Plan Maintain inhaled medications as prescribed Maintain nasal oxygen 2 L rest 3 L exertion Administer Prevnar 13 vaccine

## 2013-10-02 NOTE — Progress Notes (Signed)
Subjective:    Patient ID: [name], female    DOB: December 16, 1940, 73 y.o.   MRN: [id number]  HPI  10/02/2013 Chief Complaint  Patient presents with  . 4 month follow up    Feels breathing doing well overall - Does have DOE, wheezing, and some chest tightness.  No cough.  Doing well overall.  Pt did have a flareup this past Christmas. Pt Rx per PCP.  Now no real cough.  No real fever.  No chest pain.  occ wheeze, DOE is at baseline. Did not see any help with perforomist. Now on albuterol tid via neb med. On 2L rest 3L exertion Pt denies any significant sore throat, nasal congestion or excess secretions, fever, chills, sweats, unintended weight loss, pleurtic or exertional chest pain, orthopnea PND, or leg swelling Pt denies any increase in rescue therapy over baseline, denies waking up needing it or having any early am or nocturnal exacerbations of coughing/wheezing/or dyspnea. Pt also denies any obvious fluctuation in symptoms with  weather or environmental change or other alleviating or aggravating factors     Past Medical History  Diagnosis Date  . COPD (chronic obstructive pulmonary disease)     home O2  . Hypertension   . Hyperlipidemia   . History of tobacco abuse   . Mild depression   . Hypothyroidism   . Diastolic dysfunction   . History of nuclear stress test 09/23/2009    dipyridamole; normal pattern of perfusion; low risk scan      Family History  Problem Relation Age of Onset  . Allergies Father   . Heart disease Mother   . Rheum arthritis Mother   . Breast cancer Daughter   . Breast cancer Sister   . Cancer Maternal Aunt   . Hypertension Mother   . Stroke Maternal Grandmother   . Stroke Sister      History   Social History  . Marital Status: Married    Spouse Name: N/A    Number of Children: 2  . Years of Education: N/A   Occupational History  . retired     Theme park manager   Social History Main Topics  . Smoking status: Former Smoker -- 1.00  packs/day for 50 years    Types: Cigarettes    Quit date: 09/07/2008  . Smokeless tobacco: Never Used  . Alcohol Use: No  . Drug Use: No  . Sexual Activity: Not on file   Other Topics Concern  . Not on file   Social History Narrative  . No narrative on file     No Known Allergies   Outpatient Prescriptions Prior to Visit  Medication Sig Dispense Refill  . albuterol (PROAIR HFA) 108 (90 BASE) MCG/ACT inhaler Inhale 2 puffs into the lungs every 6 (six) hours as needed. Shortness of breath      . albuterol (PROVENTIL) (2.5 MG/3ML) 0.083% nebulizer solution USE 1 VIAL IN NEBULIZER THREE TIMES DAILY.  360 mL  3  . ALPRAZolam (XANAX) 0.25 MG tablet Take 1 tablet (0.25 mg total) by mouth 3 (three) times daily as needed for sleep or anxiety.  30 tablet  0  . buPROPion (WELLBUTRIN) 100 MG tablet Take 100 mg by mouth 2 (two) times daily.      . calcium-vitamin D (OSCAL) 250-125 MG-UNIT per tablet Take 1 tablet by mouth 2 (two) times daily.      Marland Kitchen diltiazem (DILTIAZEM HCL CD) 360 MG 24 hr capsule Take 360 mg by mouth daily.      Marland Kitchen  levothyroxine (SYNTHROID, LEVOTHROID) 25 MCG tablet Take 25 mcg by mouth daily.        Marland Kitchen loratadine (CLARITIN) 10 MG tablet Take 10 mg by mouth daily.      . Multiple Vitamin (MULTIVITAMIN) capsule Take 1 capsule by mouth daily.        . pravastatin (PRAVACHOL) 40 MG tablet Take 40 mg by mouth at bedtime.      Marland Kitchen tiotropium (SPIRIVA) 18 MCG inhalation capsule Place 1 capsule (18 mcg total) into inhaler and inhale daily.  90 capsule  3  . torsemide (DEMADEX) 20 MG tablet Take 20 mg by mouth Daily.      Marland Kitchen albuterol (PROVENTIL) (2.5 MG/3ML) 0.083% nebulizer solution USE 1 VIAL IN NEBULIZER THREE TIMES DAILY as needed  360 mL  5  . OXYGEN-HELIUM IN Inhale 2-4 L into the lungs continuous.       . formoterol (PERFOROMIST) 20 MCG/2ML nebulizer solution Take 2 mLs (20 mcg total) by nebulization 2 (two) times daily.  120 mL  6   Facility-Administered Medications Prior to  Visit  Medication Dose Route Frequency Provider Last Rate Last Dose  . neomycin-polymyxin-dexameth (MAXITROL) 0.1 % ophth ointment    PRN Tonny Branch, MD   1 application at 93/79/02 1227      Review of Systems  Constitutional: Negative for fever and unexpected weight change.  HENT: Positive for trouble swallowing. Negative for congestion, dental problem, ear pain, nosebleeds, postnasal drip, rhinorrhea, sinus pressure, sneezing and sore throat.   Eyes: Positive for itching. Negative for redness.  Respiratory: Positive for cough and shortness of breath. Negative for chest tightness and wheezing.   Cardiovascular: Negative for palpitations and leg swelling.  Gastrointestinal: Positive for nausea. Negative for vomiting.  Genitourinary: Negative for dysuria.  Musculoskeletal: Negative for joint swelling.  Skin: Negative for rash.  Neurological: Positive for headaches.  Hematological: Bruises/bleeds easily.  Psychiatric/Behavioral: Negative for dysphoric mood. The patient is nervous/anxious.        Objective:   Physical Exam  Vitals reviewed. Constitutional: She is oriented to person, place, and time. She appears well-developed and well-nourished. No distress.  HENT:  Head: Normocephalic and atraumatic.  Right Ear: External ear normal.  Left Ear: External ear normal.  Mouth/Throat: Oropharynx is clear and moist. No oropharyngeal exudate.  Eyes: Conjunctivae and EOM are normal. Pupils are equal, round, and reactive to light. Right eye exhibits no discharge. Left eye exhibits no discharge. No scleral icterus.  Neck: Normal range of motion. Neck supple. No JVD present. No tracheal deviation present. No thyromegaly present.  Cardiovascular: Normal rate, regular rhythm, normal heart sounds and intact distal pulses.  Exam reveals no gallop and no friction rub.   No murmur heard. Pulmonary/Chest: Effort normal. No respiratory distress. She has no wheezes. She has no rales. She exhibits no  tenderness.  barrell chest + Purse lip breathing +   Abdominal: Soft. Bowel sounds are normal. She exhibits no distension and no mass. There is no tenderness. There is no rebound and no guarding.  Musculoskeletal: Normal range of motion. She exhibits no edema and no tenderness.  Lymphadenopathy:    She has no cervical adenopathy.  Neurological: She is alert and oriented to person, place, and time. She has normal reflexes. No cranial nerve deficit. She exhibits normal muscle tone. Coordination normal.  Skin: Skin is warm and dry. No rash noted. She is not diaphoretic. No erythema. No pallor.  Psychiatric: She has a normal mood and affect. Her behavior is normal.  Judgment and thought content normal.  midlly anxious    BP 142/68  Pulse 84  Temp(Src) 98.4 F (36.9 C) (Oral)  Ht 5' (1.524 m)  Wt 109 lb 9.6 oz (49.714 kg)  BMI 21.40 kg/m2  SpO2 97%     Assessment & Plan:   COPD Golds D, frequent exacerbations Gold stage D. COPD with frequent exacerbations now stable Plan Maintain inhaled medications as prescribed Maintain nasal oxygen 2 L rest 3 L exertion Administer Prevnar 13 vaccine    Updated Medication List Outpatient Encounter Prescriptions as of 10/02/2013  Medication Sig  . albuterol (PROAIR HFA) 108 (90 BASE) MCG/ACT inhaler Inhale 2 puffs into the lungs every 6 (six) hours as needed. Shortness of breath  . albuterol (PROVENTIL) (2.5 MG/3ML) 0.083% nebulizer solution USE 1 VIAL IN NEBULIZER THREE TIMES DAILY.  Marland Kitchen ALPRAZolam (XANAX) 0.25 MG tablet Take 1 tablet (0.25 mg total) by mouth 3 (three) times daily as needed for sleep or anxiety.  Marland Kitchen buPROPion (WELLBUTRIN) 100 MG tablet Take 100 mg by mouth 2 (two) times daily.  . calcium-vitamin D (OSCAL) 250-125 MG-UNIT per tablet Take 1 tablet by mouth 2 (two) times daily.  Marland Kitchen diltiazem (DILTIAZEM HCL CD) 360 MG 24 hr capsule Take 360 mg by mouth daily.  Marland Kitchen levothyroxine (SYNTHROID, LEVOTHROID) 25 MCG tablet Take 25 mcg by mouth  daily.    Marland Kitchen loratadine (CLARITIN) 10 MG tablet Take 10 mg by mouth daily.  . Multiple Vitamin (MULTIVITAMIN) capsule Take 1 capsule by mouth daily.    . pravastatin (PRAVACHOL) 40 MG tablet Take 40 mg by mouth at bedtime.  Marland Kitchen tiotropium (SPIRIVA) 18 MCG inhalation capsule Place 1 capsule (18 mcg total) into inhaler and inhale daily.  Marland Kitchen torsemide (DEMADEX) 20 MG tablet Take 20 mg by mouth Daily.  . [DISCONTINUED] albuterol (PROVENTIL) (2.5 MG/3ML) 0.083% nebulizer solution USE 1 VIAL IN NEBULIZER THREE TIMES DAILY as needed  . [DISCONTINUED] OXYGEN-HELIUM IN Inhale 2-4 L into the lungs continuous.   . [DISCONTINUED] formoterol (PERFOROMIST) 20 MCG/2ML nebulizer solution Take 2 mLs (20 mcg total) by nebulization 2 (two) times daily.

## 2013-10-12 ENCOUNTER — Telehealth: Payer: Self-pay | Admitting: Critical Care Medicine

## 2013-10-12 NOTE — Telephone Encounter (Signed)
Error.[name] ° °

## 2013-10-13 ENCOUNTER — Emergency Department (HOSPITAL_COMMUNITY): Payer: Medicare Other

## 2013-10-13 ENCOUNTER — Inpatient Hospital Stay (HOSPITAL_COMMUNITY)
Admission: EM | Admit: 2013-10-13 | Discharge: 2013-10-16 | DRG: 190 | Disposition: A | Discharge status: Home / Self Care | Payer: Medicare Other | Attending: Pulmonary Disease | Admitting: Pulmonary Disease

## 2013-10-13 ENCOUNTER — Encounter (HOSPITAL_COMMUNITY): Payer: Self-pay | Admitting: Emergency Medicine

## 2013-10-13 DIAGNOSIS — Z823 Family history of stroke: Secondary | ICD-10-CM | POA: Diagnosis not present

## 2013-10-13 DIAGNOSIS — J441 Chronic obstructive pulmonary disease with (acute) exacerbation: Principal | ICD-10-CM | POA: Diagnosis present

## 2013-10-13 DIAGNOSIS — I509 Heart failure, unspecified: Secondary | ICD-10-CM | POA: Diagnosis not present

## 2013-10-13 DIAGNOSIS — E039 Hypothyroidism, unspecified: Secondary | ICD-10-CM | POA: Diagnosis present

## 2013-10-13 DIAGNOSIS — Z79899 Other long term (current) drug therapy: Secondary | ICD-10-CM

## 2013-10-13 DIAGNOSIS — E785 Hyperlipidemia, unspecified: Secondary | ICD-10-CM | POA: Diagnosis present

## 2013-10-13 DIAGNOSIS — F329 Major depressive disorder, single episode, unspecified: Secondary | ICD-10-CM | POA: Diagnosis present

## 2013-10-13 DIAGNOSIS — J449 Chronic obstructive pulmonary disease, unspecified: Secondary | ICD-10-CM | POA: Diagnosis not present

## 2013-10-13 DIAGNOSIS — Z87891 Personal history of nicotine dependence: Secondary | ICD-10-CM | POA: Diagnosis not present

## 2013-10-13 DIAGNOSIS — R0602 Shortness of breath: Secondary | ICD-10-CM | POA: Diagnosis not present

## 2013-10-13 DIAGNOSIS — K319 Disease of stomach and duodenum, unspecified: Secondary | ICD-10-CM | POA: Diagnosis not present

## 2013-10-13 DIAGNOSIS — Z9981 Dependence on supplemental oxygen: Secondary | ICD-10-CM

## 2013-10-13 DIAGNOSIS — K222 Esophageal obstruction: Secondary | ICD-10-CM | POA: Diagnosis present

## 2013-10-13 DIAGNOSIS — R0789 Other chest pain: Secondary | ICD-10-CM | POA: Diagnosis not present

## 2013-10-13 DIAGNOSIS — K5900 Constipation, unspecified: Secondary | ICD-10-CM | POA: Diagnosis present

## 2013-10-13 DIAGNOSIS — F3289 Other specified depressive episodes: Secondary | ICD-10-CM | POA: Diagnosis present

## 2013-10-13 DIAGNOSIS — I10 Essential (primary) hypertension: Secondary | ICD-10-CM | POA: Diagnosis present

## 2013-10-13 DIAGNOSIS — F411 Generalized anxiety disorder: Secondary | ICD-10-CM | POA: Diagnosis present

## 2013-10-13 DIAGNOSIS — J90 Pleural effusion, not elsewhere classified: Secondary | ICD-10-CM | POA: Diagnosis not present

## 2013-10-13 DIAGNOSIS — Z8249 Family history of ischemic heart disease and other diseases of the circulatory system: Secondary | ICD-10-CM | POA: Diagnosis not present

## 2013-10-13 DIAGNOSIS — Z803 Family history of malignant neoplasm of breast: Secondary | ICD-10-CM

## 2013-10-13 DIAGNOSIS — I519 Heart disease, unspecified: Secondary | ICD-10-CM | POA: Diagnosis not present

## 2013-10-13 DIAGNOSIS — K2960 Other gastritis without bleeding: Secondary | ICD-10-CM | POA: Diagnosis present

## 2013-10-13 DIAGNOSIS — R1310 Dysphagia, unspecified: Secondary | ICD-10-CM | POA: Diagnosis present

## 2013-10-13 DIAGNOSIS — J9620 Acute and chronic respiratory failure, unspecified whether with hypoxia or hypercapnia: Secondary | ICD-10-CM | POA: Diagnosis not present

## 2013-10-13 HISTORY — DX: Heart failure, unspecified: I50.9

## 2013-10-13 LAB — POCT I-STAT TROPONIN I: Troponin i, poc: 0.01 ng/mL (ref 0.00–0.08)

## 2013-10-13 LAB — CBC WITH DIFFERENTIAL/PLATELET
BASOS ABS: 0 10*3/uL (ref 0.0–0.1)
Basophils Relative: 1 % (ref 0–1)
Eosinophils Absolute: 0.3 10*3/uL (ref 0.0–0.7)
Eosinophils Relative: 5 % (ref 0–5)
HEMATOCRIT: 40.5 % (ref 36.0–46.0)
Hemoglobin: 13.1 g/dL (ref 12.0–15.0)
LYMPHS PCT: 19 % (ref 12–46)
Lymphs Abs: 1 10*3/uL (ref 0.7–4.0)
MCH: 28.7 pg (ref 26.0–34.0)
MCHC: 32.3 g/dL (ref 30.0–36.0)
MCV: 88.8 fL (ref 78.0–100.0)
Monocytes Absolute: 0.7 10*3/uL (ref 0.1–1.0)
Monocytes Relative: 13 % — ABNORMAL HIGH (ref 3–12)
NEUTROS PCT: 62 % (ref 43–77)
Neutro Abs: 3.3 10*3/uL (ref 1.7–7.7)
PLATELETS: 276 10*3/uL (ref 150–400)
RBC: 4.56 MIL/uL (ref 3.87–5.11)
RDW: 14.1 % (ref 11.5–15.5)
WBC: 5.3 10*3/uL (ref 4.0–10.5)

## 2013-10-13 LAB — BASIC METABOLIC PANEL
BUN: 16 mg/dL (ref 6–23)
CHLORIDE: 94 meq/L — AB (ref 96–112)
CO2: 34 mEq/L — ABNORMAL HIGH (ref 19–32)
Calcium: 9.3 mg/dL (ref 8.4–10.5)
Creatinine, Ser: 0.9 mg/dL (ref 0.50–1.10)
GFR calc Af Amer: 72 mL/min — ABNORMAL LOW (ref >90)
GFR calc non Af Amer: 62 mL/min — ABNORMAL LOW (ref >90)
Glucose, Bld: 99 mg/dL (ref 70–99)
Potassium: 3.5 mEq/L — ABNORMAL LOW (ref 3.7–5.3)
Sodium: 141 mEq/L (ref 137–147)

## 2013-10-13 LAB — PRO B NATRIURETIC PEPTIDE: PRO B NATRI PEPTIDE: 80.6 pg/mL (ref 0–125)

## 2013-10-13 MED ORDER — ONDANSETRON HCL 4 MG PO TABS: 4.0000 mg | ORAL_TABLET | Freq: Four times a day (QID) | ORAL | Status: DC | PRN

## 2013-10-13 MED ORDER — ACETAMINOPHEN 650 MG RE SUPP: 650.0000 mg | Freq: Four times a day (QID) | RECTAL | Status: DC | PRN

## 2013-10-13 MED ORDER — DEXTROSE 5 % IV SOLN
1.0000 g | INTRAVENOUS | Status: DC
  Administered 2013-10-13 – 2013-10-16 (×4): 1 g via INTRAVENOUS
  Filled 2013-10-13 (×5): qty 10

## 2013-10-13 MED ORDER — HYDROCODONE-ACETAMINOPHEN 5-325 MG PO TABS
1.0000 | ORAL_TABLET | ORAL | Status: DC | PRN
  Filled 2013-10-13: qty 2

## 2013-10-13 MED ORDER — ALBUTEROL SULFATE HFA 108 (90 BASE) MCG/ACT IN AERS: 2.0000 | INHALATION_SPRAY | Freq: Four times a day (QID) | RESPIRATORY_TRACT | Status: DC | PRN

## 2013-10-13 MED ORDER — ONDANSETRON HCL 4 MG/2ML IJ SOLN: 4.0000 mg | Freq: Four times a day (QID) | INTRAMUSCULAR | Status: DC | PRN

## 2013-10-13 MED ORDER — SIMVASTATIN 10 MG PO TABS: 10.0000 mg | ORAL_TABLET | Freq: Every day | ORAL | Status: DC

## 2013-10-13 MED ORDER — ALBUTEROL SULFATE (2.5 MG/3ML) 0.083% IN NEBU
2.5000 mg | INHALATION_SOLUTION | Freq: Four times a day (QID) | RESPIRATORY_TRACT | Status: DC | PRN
  Filled 2013-10-13: qty 3

## 2013-10-13 MED ORDER — CALCIUM CARBONATE-VITAMIN D 500-200 MG-UNIT PO TABS
1.0000 | ORAL_TABLET | Freq: Two times a day (BID) | ORAL | Status: DC
  Administered 2013-10-13 – 2013-10-15 (×6): 1 via ORAL
  Filled 2013-10-13 (×7): qty 1

## 2013-10-13 MED ORDER — ACETAMINOPHEN 325 MG PO TABS
650.0000 mg | ORAL_TABLET | Freq: Four times a day (QID) | ORAL | Status: DC | PRN
Start: 2013-10-13 — End: 2013-10-16

## 2013-10-13 MED ORDER — LEVOTHYROXINE SODIUM 25 MCG PO TABS
25.0000 ug | ORAL_TABLET | Freq: Every day | ORAL | Status: DC
  Administered 2013-10-14 – 2013-10-16 (×3): 25 ug via ORAL
  Filled 2013-10-13 (×3): qty 1

## 2013-10-13 MED ORDER — TORSEMIDE 20 MG PO TABS
20.0000 mg | ORAL_TABLET | Freq: Every day | ORAL | Status: DC
  Administered 2013-10-13 – 2013-10-15 (×3): 20 mg via ORAL
  Filled 2013-10-13 (×3): qty 1

## 2013-10-13 MED ORDER — SODIUM CHLORIDE 0.9 % IJ SOLN: 3.0000 mL | INTRAMUSCULAR | Status: DC | PRN

## 2013-10-13 MED ORDER — ALBUTEROL (5 MG/ML) CONTINUOUS INHALATION SOLN
10.0000 mg/h | INHALATION_SOLUTION | Freq: Once | RESPIRATORY_TRACT | Status: AC
  Administered 2013-10-13: 10 mg/h via RESPIRATORY_TRACT
  Filled 2013-10-13: qty 20

## 2013-10-13 MED ORDER — TIOTROPIUM BROMIDE MONOHYDRATE 18 MCG IN CAPS
18.0000 ug | ORAL_CAPSULE | Freq: Every day | RESPIRATORY_TRACT | Status: DC
  Administered 2013-10-14 – 2013-10-16 (×3): 18 ug via RESPIRATORY_TRACT
  Filled 2013-10-13: qty 5

## 2013-10-13 MED ORDER — ALPRAZOLAM 0.25 MG PO TABS
0.2500 mg | ORAL_TABLET | Freq: Three times a day (TID) | ORAL | Status: DC | PRN
  Administered 2013-10-13 – 2013-10-15 (×6): 0.25 mg via ORAL
  Filled 2013-10-13 (×6): qty 1

## 2013-10-13 MED ORDER — SODIUM CHLORIDE 0.9 % IJ SOLN
3.0000 mL | Freq: Two times a day (BID) | INTRAMUSCULAR | Status: DC
  Administered 2013-10-13 – 2013-10-15 (×3): 3 mL via INTRAVENOUS

## 2013-10-13 MED ORDER — GUAIFENESIN ER 600 MG PO TB12
1200.0000 mg | ORAL_TABLET | Freq: Two times a day (BID) | ORAL | Status: DC
  Administered 2013-10-13 – 2013-10-15 (×6): 1200 mg via ORAL
  Filled 2013-10-13 (×7): qty 2

## 2013-10-13 MED ORDER — PREDNISONE 50 MG PO TABS
60.0000 mg | ORAL_TABLET | Freq: Once | ORAL | Status: AC
  Administered 2013-10-13: 60 mg via ORAL
  Filled 2013-10-13 (×2): qty 1

## 2013-10-13 MED ORDER — DILTIAZEM HCL ER COATED BEADS 180 MG PO CP24
360.0000 mg | ORAL_CAPSULE | Freq: Every day | ORAL | Status: DC
  Administered 2013-10-14 – 2013-10-16 (×3): 360 mg via ORAL
  Filled 2013-10-13 (×4): qty 2

## 2013-10-13 MED ORDER — IPRATROPIUM BROMIDE 0.02 % IN SOLN
0.5000 mg | Freq: Once | RESPIRATORY_TRACT | Status: AC
  Administered 2013-10-13: 0.5 mg via RESPIRATORY_TRACT
  Filled 2013-10-13: qty 2.5

## 2013-10-13 MED ORDER — FUROSEMIDE 10 MG/ML IJ SOLN
40.0000 mg | Freq: Once | INTRAMUSCULAR | Status: AC
  Administered 2013-10-13: 40 mg via INTRAVENOUS
  Filled 2013-10-13: qty 4

## 2013-10-13 MED ORDER — ATORVASTATIN CALCIUM 10 MG PO TABS
10.0000 mg | ORAL_TABLET | Freq: Every day | ORAL | Status: DC
  Administered 2013-10-13 – 2013-10-15 (×3): 10 mg via ORAL
  Filled 2013-10-13 (×3): qty 1

## 2013-10-13 MED ORDER — SODIUM CHLORIDE 0.9 % IV SOLN: 250.0000 mL | INTRAVENOUS | Status: DC | PRN

## 2013-10-13 MED ORDER — ALBUTEROL SULFATE (2.5 MG/3ML) 0.083% IN NEBU
2.5000 mg | INHALATION_SOLUTION | Freq: Four times a day (QID) | RESPIRATORY_TRACT | Status: DC
  Administered 2013-10-13 – 2013-10-16 (×10): 2.5 mg via RESPIRATORY_TRACT
  Filled 2013-10-13 (×11): qty 3

## 2013-10-13 MED ORDER — METHYLPREDNISOLONE SODIUM SUCC 125 MG IJ SOLR
60.0000 mg | Freq: Four times a day (QID) | INTRAMUSCULAR | Status: DC
  Administered 2013-10-13 – 2013-10-16 (×11): 60 mg via INTRAVENOUS
  Filled 2013-10-13 (×12): qty 2

## 2013-10-13 MED ORDER — SODIUM CHLORIDE 0.9 % IJ SOLN
3.0000 mL | Freq: Two times a day (BID) | INTRAMUSCULAR | Status: DC
  Administered 2013-10-13 – 2013-10-16 (×7): 3 mL via INTRAVENOUS

## 2013-10-13 MED ORDER — ENOXAPARIN SODIUM 40 MG/0.4ML ~~LOC~~ SOLN
40.0000 mg | Freq: Every day | SUBCUTANEOUS | Status: DC
  Administered 2013-10-13 – 2013-10-16 (×4): 40 mg via SUBCUTANEOUS
  Filled 2013-10-13 (×4): qty 0.4

## 2013-10-13 MED ORDER — BUPROPION HCL 100 MG PO TABS
100.0000 mg | ORAL_TABLET | Freq: Two times a day (BID) | ORAL | Status: DC
  Administered 2013-10-13 – 2013-10-15 (×5): 100 mg via ORAL
  Filled 2013-10-13 (×10): qty 1

## 2013-10-13 MED ORDER — LORATADINE 10 MG PO TABS
10.0000 mg | ORAL_TABLET | Freq: Every day | ORAL | Status: DC
  Administered 2013-10-13 – 2013-10-16 (×4): 10 mg via ORAL
  Filled 2013-10-13 (×4): qty 1

## 2013-10-13 MED ORDER — ALUM & MAG HYDROXIDE-SIMETH 200-200-20 MG/5ML PO SUSP: 30.0000 mL | Freq: Four times a day (QID) | ORAL | Status: DC | PRN

## 2013-10-13 NOTE — ED Notes (Signed)
RT called for breathing tx. 

## 2013-10-13 NOTE — ED Notes (Signed)
Patient c/o shortness of breath, chest "thightness", and cough since Wednesday night. Denies any fever. Denies any cardiac hx. Patient reports hx of COPD in which she wears oxygen via  at home. Patient reports having dizziness on Wednesday, denies any at this time. Patient reports using albuterol inhaler and neb treatment which helped with chest tightness and shortness of breath.

## 2013-10-13 NOTE — ED Provider Notes (Signed)
CSN: [id number]     Arrival date & time 10/13/13  0827 History  This chart was scribed for Sharyon Cable, MD by Elby Beck, ED Scribe. This patient was seen in room APA06/APA06 and the patient's care was started at 8:48 AM.   Chief Complaint  Patient presents with  . Chest Pain  . Shortness of Breath    Patient is a 73 y.o. female presenting with shortness of breath. The history is provided by the patient. No language interpreter was used.  Shortness of Breath Severity:  Moderate Onset quality:  Gradual Duration:  3 days Timing:  Constant Progression:  Worsening Chronicity:  Recurrent Context comment:  History of COPD, "feels like a typical flare up" Relieved by:  Inhaler (some relief with Albuterol neb and inhaler used most recently this monring) Worsened by:  Nothing tried Ineffective treatments:  None tried Associated symptoms: cough and wheezing   Associated symptoms: no vomiting   Associated symptoms comment:  Chest tightness   HPI Comments: [name] is a 73 y.o. Female with a history of COPD who presents to the Emergency Department complaining of SOB onset 2 nights ago, which is worse than her baseline SOB. She reports associated chest tightness, wheezing and cough over the past 2 days. She states that she has not been having chest "pain", only tightness. She reports that her symptoms feel like a typical COPD flare-up for her, but also expresses concern for pneumonia. Pt reports being on 3L supplemental oxygen at home, constantly, via a nasal cannula. Pt reports using her Albuterol nebulizer and inhaler this morning with some relief. She states that she has not been on any steroids at home recently. She states that she had an appointment with her Pulmonologist for tomorrow but that she cancelled this due to temporary improvement of her symptoms. She reports having a 1 hour episode of room spinning dizziness 2 nights ago, preceding her COPD symptoms. She states that this  episode resolved but that it concerned her because she had no prior history of dizziness. She reports having intermittent, mild leg swelling at baseline, with no recent changes. She states that she received this season's flu vaccine, and that she has also received her pneumonia vaccine. She denies syncope, vomiting, diarrhea, blood in stool or any other symptoms. She is a former smoker of 50 years, who quit about 4 years ago.    Past Medical History  Diagnosis Date  . COPD (chronic obstructive pulmonary disease)     home O2  . Hypertension   . Hyperlipidemia   . History of tobacco abuse   . Mild depression   . Hypothyroidism   . Diastolic dysfunction   . History of nuclear stress test 09/23/2009    dipyridamole; normal pattern of perfusion; low risk scan    Past Surgical History  Procedure Laterality Date  . Gallbladder surgery  1977  . Total abdominal hysterectomy  1980's  . Cataract extraction w/phaco  09/08/2012    Procedure: CATARACT EXTRACTION PHACO AND INTRAOCULAR LENS PLACEMENT (IOC);  Surgeon: Tonny Branch, MD;  Location: [facility];  Service: Ophthalmology;  Laterality: Right;  CDE:19.67  . Tonsillectomy    . Right carpal tunnel release    . Cataract extraction w/phaco  09/19/2012    Procedure: CATARACT EXTRACTION PHACO AND INTRAOCULAR LENS PLACEMENT (IOC);  Surgeon: Tonny Branch, MD;  Location: [facility];  Service: Ophthalmology;  Laterality: Left;  CDE:19.83  . Transthoracic echocardiogram  09/23/2009    EF>55%; trace TR; normal  LA & RA size; normal LV & RV size & systolic function    Family History  Problem Relation Age of Onset  . Allergies Father   . Heart disease Mother   . Rheum arthritis Mother   . Breast cancer Daughter   . Breast cancer Sister   . Cancer Maternal Aunt   . Hypertension Mother   . Stroke Maternal Grandmother   . Stroke Sister    History  Substance Use Topics  . Smoking status: Former Smoker -- 1.00 packs/day for 50 years    Types: Cigarettes    Quit  date: 09/07/2008  . Smokeless tobacco: Never Used  . Alcohol Use: No   OB History   Grav Para Term Preterm Abortions TAB SAB Ect Mult Living                 Review of Systems  Respiratory: Positive for cough, chest tightness, shortness of breath and wheezing.   Cardiovascular: Positive for leg swelling (intermittent, mild).  Gastrointestinal: Negative for vomiting, diarrhea and blood in stool.  Neurological: Positive for dizziness (resolved). Negative for syncope.  All other systems reviewed and are negative.   Allergies  Review of patient's allergies indicates no known allergies.  Home Medications   Current Outpatient Rx  Name  Route  Sig  Dispense  Refill  . albuterol (PROAIR HFA) 108 (90 BASE) MCG/ACT inhaler   Inhalation   Inhale 2 puffs into the lungs every 6 (six) hours as needed. Shortness of breath         . albuterol (PROVENTIL) (2.5 MG/3ML) 0.083% nebulizer solution      USE 1 VIAL IN NEBULIZER THREE TIMES DAILY.   360 mL   3     Dx Code 496   . ALPRAZolam (XANAX) 0.25 MG tablet   Oral   Take 1 tablet (0.25 mg total) by mouth 3 (three) times daily as needed for sleep or anxiety.   30 tablet   0   . buPROPion (WELLBUTRIN) 100 MG tablet   Oral   Take 100 mg by mouth 2 (two) times daily.         . calcium-vitamin D (OSCAL) 250-125 MG-UNIT per tablet   Oral   Take 1 tablet by mouth 2 (two) times daily.         Marland Kitchen diltiazem (DILTIAZEM HCL CD) 360 MG 24 hr capsule   Oral   Take 360 mg by mouth daily.         Marland Kitchen levothyroxine (SYNTHROID, LEVOTHROID) 25 MCG tablet   Oral   Take 25 mcg by mouth daily.           Marland Kitchen loratadine (CLARITIN) 10 MG tablet   Oral   Take 10 mg by mouth daily.         . Multiple Vitamin (MULTIVITAMIN) capsule   Oral   Take 1 capsule by mouth daily.           . pravastatin (PRAVACHOL) 40 MG tablet   Oral   Take 40 mg by mouth at bedtime.         Marland Kitchen tiotropium (SPIRIVA) 18 MCG inhalation capsule   Inhalation    Place 1 capsule (18 mcg total) into inhaler and inhale daily.   90 capsule   3   . torsemide (DEMADEX) 20 MG tablet   Oral   Take 20 mg by mouth Daily.          Triage Vitals: BP 162/76  Pulse 99  Resp 28  Ht 5' (1.524 m)  Wt 109 lb (49.442 kg)  BMI 21.29 kg/m2  SpO2 100% BP 117/77  Pulse 99  Temp(Src) 98.1 F (36.7 C) (Oral)  Resp 17  Ht 5' (1.524 m)  Wt 109 lb (49.442 kg)  BMI 21.29 kg/m2  SpO2 100%   Physical Exam  Nursing note and vitals reviewed.  CONSTITUTIONAL: Well developed/well nourished HEAD: Normocephalic/atraumatic EYES: EOMI/PERRL, no nystagmus ENMT: Mucous membranes moist NECK: supple no meningeal signs, no bruits SPINE:entire spine nontender CV: S1/S2 noted, no murmurs/rubs/gallops noted LUNGS Wheezing bilaterally with tachypnea, but pt is able to speak to me clearly ABDOMEN: soft, nontender, no rebound or guarding GU:no cva tenderness NEURO:Awake/alert, facies symmetric, no arm or leg drift is noted No past pointing EXTREMITIES: pulses normal, full ROM, no LE edema noted SKIN: warm, color normal PSYCH: no abnormalities of mood noted   ED Course  Procedures   DIAGNOSTIC STUDIES: Oxygen Saturation is 100% on RA, normal by my interpretation.    COORDINATION OF CARE: 8:55 AM- Discussed plan to order a breathing treatment, Lasix and Prednisone. Will also obtain a CXR and diagnostic lab work. Pt advised of plan for treatment and pt agrees.  10:26 AM- Recheck and pt states that her symptoms have improved some, but not fully and was tachypneic/hypoxic on ambulation. Discussed plan for possible admission and pt states that she would be okay with this.    D/w dr Luan Pulling, her PCP will admit for further treatment of COPD exacerbation Medications  predniSONE (DELTASONE) tablet 60 mg (60 mg Oral Given 10/13/13 0905)  albuterol (PROVENTIL,VENTOLIN) solution continuous neb (10 mg/hr Nebulization Given 10/13/13 0908)  ipratropium (ATROVENT) nebulizer  solution 0.5 mg (0.5 mg Nebulization Given 10/13/13 0908)  furosemide (LASIX) injection 40 mg (40 mg Intravenous Given 10/13/13 0937)   Labs Review Labs Reviewed  BASIC METABOLIC PANEL - Abnormal; Notable for the following:    Potassium 3.5 (*)    Chloride 94 (*)    CO2 34 (*)    GFR calc non Af Amer 62 (*)    GFR calc Af Amer 72 (*)    All other components within normal limits  CBC WITH DIFFERENTIAL - Abnormal; Notable for the following:    Monocytes Relative 13 (*)    All other components within normal limits  PRO B NATRIURETIC PEPTIDE   Imaging Review Dg Chest Portable 1 View  10/13/2013   CLINICAL DATA:  Chest pain.  Short of breath.  EXAM: PORTABLE CHEST - 1 VIEW  COMPARISON:  DG CHEST 2 VIEW dated 09/12/2013; DG CHEST 2 VIEW dated 10/31/2010; DG CHEST 2 VIEW dated 12/12/2010  FINDINGS: Cardiopericardial silhouette within normal limits. Aortic arch atherosclerosis. There is cephalization of pulmonary blood flow and pulmonary vascular congestion consistent with volume overload. Small left pleural effusion. No focal consolidation.  IMPRESSION: Findings compatible with mild CHF with small left pleural effusion and cephalization of pulmonary blood flow.   Electronically Signed   By: Dereck Ligas M.D.   On: 10/13/2013 09:17    EKG Interpretation    Date/Time:  Friday October 13 2013 08:46:56 EST Ventricular Rate:  99 PR Interval:  186 QRS Duration: 72 QT Interval:  346 QTC Calculation: 444 R Axis:   62 Text Interpretation:  Normal sinus rhythm Right atrial enlargement Borderline ECG When compared with ECG of 02-Sep-2012 09:33, No significant change was found artifact noted Confirmed by Christy Gentles  MD, Delicia Berens [phone]) on 10/13/2013 8:57:36 AM  MDM  No diagnosis found. Nursing notes including past medical history and social history reviewed and considered in documentation xrays reviewed and considered Labs/vital reviewed and considered Previous records reviewed and  considered   I personally performed the services described in this documentation, which was scribed in my presence. The recorded information has been reviewed and is accurate.      Sharyon Cable, MD 10/13/13 [phone]

## 2013-10-14 ENCOUNTER — Encounter: Payer: Self-pay | Admitting: Critical Care Medicine

## 2013-10-14 NOTE — Progress Notes (Signed)
Utilization review Completed Philemon Riedesel RN BSN   

## 2013-10-14 NOTE — Progress Notes (Signed)
Subjective: She says she feels better. She is still somewhat short of breath with exertion. She has minimal cough.  Objective: Vital signs in last 24 hours: Temp:  [97.6 F (36.4 C)-98.2 F (36.8 C)] 97.9 F (36.6 C) (02/07 0550) Pulse Rate:  [88-107] 107 (02/07 0712) Resp:  [16-18] 18 (02/07 0712) BP: (124-140)/(60-94) 126/71 mmHg (02/07 0550) SpO2:  [95 %-100 %] 97 % (02/07 0712) Weight:  [49.2 kg (108 lb 7.5 oz)] 49.2 kg (108 lb 7.5 oz) (02/06 1300) Weight change:  Last BM Date: 10/12/13  Intake/Output from previous day: 02/06 0701 - 02/07 0700 In: -  Out: 1300 [Urine:1300]  PHYSICAL EXAM General appearance: alert, cooperative and mild distress Resp: rhonchi bilaterally Cardio: regular rate and rhythm, S1, S2 normal, no murmur, click, rub or gallop GI: soft, non-tender; bowel sounds normal; no masses,  no organomegaly Extremities: extremities normal, atraumatic, no cyanosis or edema  Lab Results:    Basic Metabolic Panel:  Recent Labs  10/13/13 0906  NA 141  K 3.5*  CL 94*  CO2 34*  GLUCOSE 99  BUN 16  CREATININE 0.90  CALCIUM 9.3   Liver Function Tests: No results found for this basename: AST, ALT, ALKPHOS, BILITOT, PROT, ALBUMIN,  in the last 72 hours No results found for this basename: LIPASE, AMYLASE,  in the last 72 hours No results found for this basename: AMMONIA,  in the last 72 hours CBC:  Recent Labs  10/13/13 0906  WBC 5.3  NEUTROABS 3.3  HGB 13.1  HCT 40.5  MCV 88.8  PLT 276   Cardiac Enzymes: No results found for this basename: CKTOTAL, CKMB, CKMBINDEX, TROPONINI,  in the last 72 hours BNP:  Recent Labs  10/13/13 0919  PROBNP 80.6   D-Dimer: No results found for this basename: DDIMER,  in the last 72 hours CBG: No results found for this basename: GLUCAP,  in the last 72 hours Hemoglobin A1C: No results found for this basename: HGBA1C,  in the last 72 hours Fasting Lipid Panel: No results found for this basename: CHOL, HDL,  LDLCALC, TRIG, CHOLHDL, LDLDIRECT,  in the last 72 hours Thyroid Function Tests: No results found for this basename: TSH, T4TOTAL, FREET4, T3FREE, THYROIDAB,  in the last 72 hours Anemia Panel: No results found for this basename: VITAMINB12, FOLATE, FERRITIN, TIBC, IRON, RETICCTPCT,  in the last 72 hours Coagulation: No results found for this basename: LABPROT, INR,  in the last 72 hours Urine Drug Screen: Drugs of Abuse  No results found for this basename: labopia, cocainscrnur, labbenz, amphetmu, thcu, labbarb    Alcohol Level: No results found for this basename: ETH,  in the last 72 hours Urinalysis: No results found for this basename: COLORURINE, APPERANCEUR, LABSPEC, PHURINE, GLUCOSEU, HGBUR, BILIRUBINUR, KETONESUR, PROTEINUR, UROBILINOGEN, NITRITE, LEUKOCYTESUR,  in the last 72 hours Misc. Labs:  ABGS No results found for this basename: PHART, PCO2, PO2ART, TCO2, HCO3,  in the last 72 hours CULTURES No results found for this or any previous visit (from the past 240 hour(s)). Studies/Results: Dg Chest Portable 1 View  10/13/2013   CLINICAL DATA:  Chest pain.  Short of breath.  EXAM: PORTABLE CHEST - 1 VIEW  COMPARISON:  DG CHEST 2 VIEW dated 09/12/2013; DG CHEST 2 VIEW dated 10/31/2010; DG CHEST 2 VIEW dated 12/12/2010  FINDINGS: Cardiopericardial silhouette within normal limits. Aortic arch atherosclerosis. There is cephalization of pulmonary blood flow and pulmonary vascular congestion consistent with volume overload. Small left pleural effusion. No focal consolidation.  IMPRESSION:  Findings compatible with mild CHF with small left pleural effusion and cephalization of pulmonary blood flow.   Electronically Signed   By: Dereck Ligas M.D.   On: 10/13/2013 09:17    Medications:  Prior to Admission:  Prescriptions prior to admission  Medication Sig Dispense Refill  . albuterol (PROAIR HFA) 108 (90 BASE) MCG/ACT inhaler Inhale 2 puffs into the lungs every 6 (six) hours as needed.  Shortness of breath      . albuterol (PROVENTIL) (2.5 MG/3ML) 0.083% nebulizer solution USE 1 VIAL IN NEBULIZER THREE TIMES DAILY.  360 mL  3  . ALPRAZolam (XANAX) 0.25 MG tablet Take 1 tablet (0.25 mg total) by mouth 3 (three) times daily as needed for sleep or anxiety.  30 tablet  0  . buPROPion (WELLBUTRIN) 100 MG tablet Take 100 mg by mouth 2 (two) times daily.      . calcium-vitamin D (OSCAL) 250-125 MG-UNIT per tablet Take 1 tablet by mouth 2 (two) times daily.      Marland Kitchen diltiazem (DILTIAZEM HCL CD) 360 MG 24 hr capsule Take 360 mg by mouth daily.      Marland Kitchen levothyroxine (SYNTHROID, LEVOTHROID) 25 MCG tablet Take 25 mcg by mouth daily.        Marland Kitchen loratadine (CLARITIN) 10 MG tablet Take 10 mg by mouth daily.      . Multiple Vitamin (MULTIVITAMIN) capsule Take 1 capsule by mouth daily.        . pravastatin (PRAVACHOL) 40 MG tablet Take 40 mg by mouth at bedtime.      Marland Kitchen tiotropium (SPIRIVA) 18 MCG inhalation capsule Place 1 capsule (18 mcg total) into inhaler and inhale daily.  90 capsule  3  . torsemide (DEMADEX) 20 MG tablet Take 20 mg by mouth Daily.       Scheduled: . albuterol  2.5 mg Nebulization Q6H  . atorvastatin  10 mg Oral q1800  . buPROPion  100 mg Oral BID  . calcium-vitamin D  1 tablet Oral BID  . cefTRIAXone (ROCEPHIN)  IV  1 g Intravenous Q24H  . diltiazem  360 mg Oral Daily  . enoxaparin (LOVENOX) injection  40 mg Subcutaneous Daily  . guaiFENesin  1,200 mg Oral BID  . levothyroxine  25 mcg Oral QAC breakfast  . loratadine  10 mg Oral Daily  . methylPREDNISolone (SOLU-MEDROL) injection  60 mg Intravenous Q6H  . sodium chloride  3 mL Intravenous Q12H  . sodium chloride  3 mL Intravenous Q12H  . tiotropium  18 mcg Inhalation Daily  . torsemide  20 mg Oral Daily   Continuous:  QMV:HQIONG chloride, acetaminophen, acetaminophen, albuterol, ALPRAZolam, alum & mag hydroxide-simeth, HYDROcodone-acetaminophen, ondansetron (ZOFRAN) IV, ondansetron, sodium chloride  Assesment: She  has COPD exacerbation and has improved. She is on IV steroids antibiotics and inhaled bronchodilators Active Problems:   COPD (chronic obstructive pulmonary disease)   COPD exacerbation    Plan: Continue with current treatments.    LOS: 1 day   Galen Russman L 10/14/2013, 10:09 AM

## 2013-10-14 NOTE — H&P (Signed)
NAMEJOYCELYN, LISKA                 ACCOUNT NO.:  [id number]  MEDICAL RECORD NO.:  [id number]  LOCATION:  A328                          FACILITY:  [facility]  PHYSICIAN:  Suzann Lazaro L. Luan Pulling, M.D.DATE OF BIRTH:  04-26-1941  DATE OF ADMISSION:  10/13/2013 DATE OF DISCHARGE:  LH                             HISTORY & PHYSICAL   REASON FOR ADMISSION:  COPD exacerbation.  HISTORY:  Ms. Tesmer is a 73 year old with a long known history of severe COPD, which is oxygen dependent.  She was in her usual state of fair health when she developed increasing cough, congestion, and shortness of breath, and presented to the emergency room for admission.  She actually had an appointment to be seen in my office yesterday but she felt a little bit better so she canceled that appointment.  Since that time, she had cough, congestion, perhaps some fever although it is not totally clear.  She was treated in the emergency room but did not clear enough for discharge so she was admitted.  She says she feels better now.  She has no new complaints.  She says she is hungry.  She has coughed up some greenish colored sputum.  PAST MEDICAL HISTORY:  Positive for COPD.  She has had hypothyroidism. She has had some problems with anxiety and depression.  Both of those appear to be stable.  She has had a little bit of fluid overload in the past.  SOCIAL HISTORY:  She does not smoke.  She does not use alcohol.  She lives at home, and manages her activities of daily living on her own.  REVIEW OF SYSTEMS:  Except as mentioned is negative.  She has not noticed any swelling of her legs.  She has not had any chest pain, hemoptysis.  FAMILY HISTORY:  Positive for lung disease but it is not clear how much.  PHYSICAL EXAMINATION:  GENERAL:  A well-developed, well-nourished female, who is in no acute distress. She is awake and alert. VITAL SIGNS:  Temperature is 98.1, pulse 99, respirations 28, blood pressure 162/76, O2 sats 100%  on oxygen. HEENT:  Her pupils are reactive.  Nose and throat are clear.  Mucous membranes are slightly dry. NECK:  Supple. CHEST:  Clear. HEART:  Regular. ABDOMEN:  Soft without masses. EXTREMITIES:  No edema. CENTRAL NERVOUS SYSTEM:  Grossly intact.  IMAGING:  Chest x-ray does not show pneumonia.  ASSESSMENT:  She has chronic obstructive pulmonary disease exacerbation which did not resolve in the emergency department.  She seems better now but she did that previously and ended up having more trouble and having to be admitted, so I am going to admit her to the hospital and start her on IV steroids, IV antibiotics, continue with her regular medications.     Elius Etheredge L. Luan Pulling, M.D.     ELH/MEDQ  D:  10/13/2013  T:  10/14/2013  Job:  [id number]

## 2013-10-15 DIAGNOSIS — I519 Heart disease, unspecified: Secondary | ICD-10-CM

## 2013-10-15 MED ORDER — FUROSEMIDE 10 MG/ML IJ SOLN
40.0000 mg | Freq: Once | INTRAMUSCULAR | Status: AC
  Administered 2013-10-15: 40 mg via INTRAVENOUS
  Filled 2013-10-15: qty 4

## 2013-10-15 NOTE — Progress Notes (Signed)
Subjective: She says she feels some better but still very weak. She's concerned that she's too weak for discharge  Objective: Vital signs in last 24 hours: Temp:  [97.7 F (36.5 C)-98.2 F (36.8 C)] 97.9 F (36.6 C) (02/08 0539) Pulse Rate:  [75-89] 86 (02/08 0539) Resp:  [16-18] 18 (02/08 0539) BP: (138-160)/(70-84) 158/82 mmHg (02/08 0925) SpO2:  [95 %-99 %] 98 % (02/08 0719) Weight change:  Last BM Date: 10/14/13  Intake/Output from previous day: 02/07 0701 - 02/08 0700 In: 710 [P.O.:710] Out: 450 [Urine:450]  PHYSICAL EXAM General appearance: alert, cooperative and mild distress Resp: She has decreased breath sounds and is mildly "tight" Cardio: regular rate and rhythm, S1, S2 normal, no murmur, click, rub or gallop GI: soft, non-tender; bowel sounds normal; no masses,  no organomegaly Extremities: extremities normal, atraumatic, no cyanosis or edema  Lab Results:    Basic Metabolic Panel:  Recent Labs  10/13/13 0906  NA 141  K 3.5*  CL 94*  CO2 34*  GLUCOSE 99  BUN 16  CREATININE 0.90  CALCIUM 9.3   Liver Function Tests: No results found for this basename: AST, ALT, ALKPHOS, BILITOT, PROT, ALBUMIN,  in the last 72 hours No results found for this basename: LIPASE, AMYLASE,  in the last 72 hours No results found for this basename: AMMONIA,  in the last 72 hours CBC:  Recent Labs  10/13/13 0906  WBC 5.3  NEUTROABS 3.3  HGB 13.1  HCT 40.5  MCV 88.8  PLT 276   Cardiac Enzymes: No results found for this basename: CKTOTAL, CKMB, CKMBINDEX, TROPONINI,  in the last 72 hours BNP:  Recent Labs  10/13/13 0919  PROBNP 80.6   D-Dimer: No results found for this basename: DDIMER,  in the last 72 hours CBG: No results found for this basename: GLUCAP,  in the last 72 hours Hemoglobin A1C: No results found for this basename: HGBA1C,  in the last 72 hours Fasting Lipid Panel: No results found for this basename: CHOL, HDL, LDLCALC, TRIG, CHOLHDL,  LDLDIRECT,  in the last 72 hours Thyroid Function Tests: No results found for this basename: TSH, T4TOTAL, FREET4, T3FREE, THYROIDAB,  in the last 72 hours Anemia Panel: No results found for this basename: VITAMINB12, FOLATE, FERRITIN, TIBC, IRON, RETICCTPCT,  in the last 72 hours Coagulation: No results found for this basename: LABPROT, INR,  in the last 72 hours Urine Drug Screen: Drugs of Abuse  No results found for this basename: labopia, cocainscrnur, labbenz, amphetmu, thcu, labbarb    Alcohol Level: No results found for this basename: ETH,  in the last 72 hours Urinalysis: No results found for this basename: COLORURINE, APPERANCEUR, LABSPEC, PHURINE, GLUCOSEU, HGBUR, BILIRUBINUR, KETONESUR, PROTEINUR, UROBILINOGEN, NITRITE, LEUKOCYTESUR,  in the last 72 hours Misc. Labs:  ABGS No results found for this basename: PHART, PCO2, PO2ART, TCO2, HCO3,  in the last 72 hours CULTURES No results found for this or any previous visit (from the past 240 hour(s)). Studies/Results: No results found.  Medications:  Prior to Admission:  Prescriptions prior to admission  Medication Sig Dispense Refill  . albuterol (PROAIR HFA) 108 (90 BASE) MCG/ACT inhaler Inhale 2 puffs into the lungs every 6 (six) hours as needed. Shortness of breath      . albuterol (PROVENTIL) (2.5 MG/3ML) 0.083% nebulizer solution USE 1 VIAL IN NEBULIZER THREE TIMES DAILY.  360 mL  3  . ALPRAZolam (XANAX) 0.25 MG tablet Take 1 tablet (0.25 mg total) by mouth 3 (three) times daily  as needed for sleep or anxiety.  30 tablet  0  . buPROPion (WELLBUTRIN) 100 MG tablet Take 100 mg by mouth 2 (two) times daily.      . calcium-vitamin D (OSCAL) 250-125 MG-UNIT per tablet Take 1 tablet by mouth 2 (two) times daily.      Marland Kitchen diltiazem (DILTIAZEM HCL CD) 360 MG 24 hr capsule Take 360 mg by mouth daily.      Marland Kitchen levothyroxine (SYNTHROID, LEVOTHROID) 25 MCG tablet Take 25 mcg by mouth daily.        Marland Kitchen loratadine (CLARITIN) 10 MG tablet  Take 10 mg by mouth daily.      . Multiple Vitamin (MULTIVITAMIN) capsule Take 1 capsule by mouth daily.        . pravastatin (PRAVACHOL) 40 MG tablet Take 40 mg by mouth at bedtime.      Marland Kitchen tiotropium (SPIRIVA) 18 MCG inhalation capsule Place 1 capsule (18 mcg total) into inhaler and inhale daily.  90 capsule  3  . torsemide (DEMADEX) 20 MG tablet Take 20 mg by mouth Daily.       Scheduled: . albuterol  2.5 mg Nebulization Q6H  . atorvastatin  10 mg Oral q1800  . buPROPion  100 mg Oral BID  . calcium-vitamin D  1 tablet Oral BID  . cefTRIAXone (ROCEPHIN)  IV  1 g Intravenous Q24H  . diltiazem  360 mg Oral Daily  . enoxaparin (LOVENOX) injection  40 mg Subcutaneous Daily  . furosemide  40 mg Intravenous Once  . guaiFENesin  1,200 mg Oral BID  . levothyroxine  25 mcg Oral QAC breakfast  . loratadine  10 mg Oral Daily  . methylPREDNISolone (SOLU-MEDROL) injection  60 mg Intravenous Q6H  . sodium chloride  3 mL Intravenous Q12H  . sodium chloride  3 mL Intravenous Q12H  . tiotropium  18 mcg Inhalation Daily   Continuous:  VHQ:IONGEX chloride, acetaminophen, acetaminophen, albuterol, ALPRAZolam, alum & mag hydroxide-simeth, HYDROcodone-acetaminophen, ondansetron (ZOFRAN) IV, ondansetron, sodium chloride  Assesment: She is admitted with COPD exacerbation. Her chest x-ray was at least suggestive that she may have heart failure volume overload et Ronney Asters. She is very weak Active Problems:   COPD (chronic obstructive pulmonary disease)   COPD exacerbation    Plan: She'll receive Lasix wants. Continue with her other treatments. I get her up and out of the bed and moving around and see how she does with that. She will have echocardiogram. No other changes. Once I see how she does she may need physical therapy consultation    LOS: 2 days   Lashelle Koy L 10/15/2013, 10:11 AM

## 2013-10-16 ENCOUNTER — Ambulatory Visit: Payer: Medicare Other | Admitting: Critical Care Medicine

## 2013-10-16 ENCOUNTER — Encounter (HOSPITAL_COMMUNITY)
Admission: EM | Disposition: A | Discharge status: Home / Self Care | Payer: Self-pay | Source: Home / Self Care | Attending: Pulmonary Disease

## 2013-10-16 ENCOUNTER — Encounter (HOSPITAL_COMMUNITY): Payer: Self-pay | Admitting: Gastroenterology

## 2013-10-16 DIAGNOSIS — R1310 Dysphagia, unspecified: Secondary | ICD-10-CM

## 2013-10-16 HISTORY — PX: ESOPHAGOGASTRODUODENOSCOPY (EGD) WITH ESOPHAGEAL DILATION: SHX5812

## 2013-10-16 LAB — BASIC METABOLIC PANEL
BUN: 21 mg/dL (ref 6–23)
CO2: 44 mEq/L (ref 19–32)
Calcium: 9.4 mg/dL (ref 8.4–10.5)
Chloride: 91 mEq/L — ABNORMAL LOW (ref 96–112)
Creatinine, Ser: 0.67 mg/dL (ref 0.50–1.10)
GFR calc Af Amer: >90 mL/min (ref >90)
GFR calc non Af Amer: 86 mL/min — ABNORMAL LOW (ref >90)
Glucose, Bld: 162 mg/dL — ABNORMAL HIGH (ref 70–99)
Potassium: 2.7 mEq/L — CL (ref 3.7–5.3)
SODIUM: 142 meq/L (ref 137–147)

## 2013-10-16 SURGERY — ESOPHAGOGASTRODUODENOSCOPY (EGD) WITH ESOPHAGEAL DILATION
Anesthesia: Moderate Sedation

## 2013-10-16 MED ORDER — MIDAZOLAM HCL 5 MG/5ML IJ SOLN
INTRAMUSCULAR | Status: DC | PRN
  Administered 2013-10-16 (×3): 1 mg via INTRAVENOUS
  Administered 2013-10-16: 2 mg via INTRAVENOUS

## 2013-10-16 MED ORDER — PANTOPRAZOLE SODIUM 40 MG PO TBEC
40.0000 mg | DELAYED_RELEASE_TABLET | Freq: Every day | ORAL | Status: DC
  Administered 2013-10-16: 40 mg via ORAL
  Filled 2013-10-16: qty 1

## 2013-10-16 MED ORDER — LIDOCAINE VISCOUS 2 % MT SOLN
OROMUCOSAL | Status: AC
  Filled 2013-10-16: qty 15

## 2013-10-16 MED ORDER — PREDNISONE 20 MG PO TABS
40.0000 mg | ORAL_TABLET | Freq: Every day | ORAL | Status: DC
  Administered 2013-10-16: 40 mg via ORAL
  Filled 2013-10-16: qty 2

## 2013-10-16 MED ORDER — PANTOPRAZOLE SODIUM 40 MG PO TBEC: 40.0000 mg | DELAYED_RELEASE_TABLET | Freq: Every day | ORAL | Status: DC

## 2013-10-16 MED ORDER — POTASSIUM CHLORIDE 20 MEQ/15ML (10%) PO LIQD
40.0000 meq | ORAL | Status: AC
  Administered 2013-10-16 (×2): 40 meq via ORAL
  Filled 2013-10-16 (×2): qty 30

## 2013-10-16 MED ORDER — SODIUM CHLORIDE 0.9 % IV SOLN
INTRAVENOUS | Status: DC
  Administered 2013-10-16: 1000 mL via INTRAVENOUS

## 2013-10-16 MED ORDER — MEPERIDINE HCL 100 MG/ML IJ SOLN
INTRAMUSCULAR | Status: AC
  Filled 2013-10-16: qty 2

## 2013-10-16 MED ORDER — MIDAZOLAM HCL 5 MG/5ML IJ SOLN
INTRAMUSCULAR | Status: AC
  Filled 2013-10-16: qty 10

## 2013-10-16 MED ORDER — CEPHALEXIN 500 MG PO CAPS: 500.0000 mg | ORAL_CAPSULE | Freq: Four times a day (QID) | ORAL | Status: DC

## 2013-10-16 MED ORDER — LIDOCAINE VISCOUS 2 % MT SOLN
OROMUCOSAL | Status: DC | PRN
  Administered 2013-10-16 (×2): 2 mL via OROMUCOSAL

## 2013-10-16 MED ORDER — MEPERIDINE HCL 100 MG/ML IJ SOLN
INTRAMUSCULAR | Status: DC | PRN
  Administered 2013-10-16 (×2): 25 mg via INTRAVENOUS

## 2013-10-16 MED ORDER — METHYLPREDNISOLONE (PAK) 4 MG PO TABS: ORAL_TABLET | ORAL | Status: DC

## 2013-10-16 MED ORDER — STERILE WATER FOR IRRIGATION IR SOLN
Status: DC | PRN
  Administered 2013-10-16: 14:00:00

## 2013-10-16 MED ORDER — LEVOFLOXACIN 500 MG PO TABS: 500.0000 mg | ORAL_TABLET | Freq: Every day | ORAL | Status: DC

## 2013-10-16 MED ORDER — MINERAL OIL PO OIL
TOPICAL_OIL | ORAL | Status: AC
  Filled 2013-10-16: qty 30

## 2013-10-16 NOTE — Consult Note (Addendum)
REVIEWED. LOVENOX AT 1443. OK TO PROCEED WITH EGD/DIL. K 2.7. PT S/P 80 mEq PO KCL.

## 2013-10-16 NOTE — Consult Note (Signed)
Referring Provider: Dr. Luan Pulling Primary Care Physician:  Dr. Luan Pulling Primary Gastroenterologist:  Dr. Gala Romney   Date of Admission: 10/13/13 Date of Consultation: 10/16/13  Reason for Consultation:  Dysphagia   HPI:  [name] is a pleasant 73 year old female who was admitted with COPD exacerbation, heart failure. She noted this morning concerns regarding dysphagia. Last EGD in 2007 by Dr. Gala Romney with normal esophagus, non-critical Schatzki's ring not manipulated, normal small bowel biopsy. Colonoscopy also completed at that time with internal hemorrhoids and benign polyps.   Symptoms worsening over past 6 months. Had to give up cornbread and steak. Denies reflux symptoms. Points to mid-esophagus, stating food is "stuck" intermittently. No odynophagia. Pill dysphagia noted as well. Soft foods tolerated well. No N/V. Notes lack of appetite in the last few months. Lost about 5 lbs, self-reported. States it is normal for her weight to fluctuate intermittently. No abdominal pain. Advil prn, rarely. No melena, hematochezia. Lately has had issues with constipation. Takes OTC agent as needed. States if she "watches what I eat", constipation is managed. Patient states she sticks with foods that she tolerates but just wanted to mention her swallowing issue prior to discharge.   Past Medical History  Diagnosis Date  . COPD (chronic obstructive pulmonary disease)     home O2  . Hypertension   . Hyperlipidemia   . History of tobacco abuse   . Mild depression   . Hypothyroidism   . Diastolic dysfunction   . History of nuclear stress test 09/23/2009    dipyridamole; normal pattern of perfusion; low risk scan   . CHF (congestive heart failure)     Past Surgical History  Procedure Laterality Date  . Gallbladder surgery  1977  . Total abdominal hysterectomy  1980's  . Cataract extraction w/phaco  09/08/2012    Procedure: CATARACT EXTRACTION PHACO AND INTRAOCULAR LENS PLACEMENT (IOC);  Surgeon:  Tonny Branch, MD;  Location: [facility];  Service: Ophthalmology;  Laterality: Right;  CDE:19.67  . Tonsillectomy    . Right carpal tunnel release    . Cataract extraction w/phaco  09/19/2012    Procedure: CATARACT EXTRACTION PHACO AND INTRAOCULAR LENS PLACEMENT (IOC);  Surgeon: Tonny Branch, MD;  Location: [facility];  Service: Ophthalmology;  Laterality: Left;  CDE:19.83  . Transthoracic echocardiogram  09/23/2009    EF>55%; trace TR; normal LA & RA size; normal LV & RV size & systolic function   . Colonoscopy  2007    Dr. Gala Romney: internal hemorrhoids, benign polyps  . Esophagogastroduodenoscopy  2007    Dr. Gala Romney: normal esophagus, non-critical Schatzki's ring not manipulated, normal small bowel biopsy    Prior to Admission medications   Medication Sig Start Date End Date Taking? Authorizing Provider  albuterol (PROAIR HFA) 108 (90 BASE) MCG/ACT inhaler Inhale 2 puffs into the lungs every 6 (six) hours as needed. Shortness of breath   Yes Historical Provider, MD  albuterol (PROVENTIL) (2.5 MG/3ML) 0.083% nebulizer solution USE 1 VIAL IN NEBULIZER THREE TIMES DAILY. 08/25/13  Yes Elsie Stain, MD  ALPRAZolam Duanne Moron) 0.25 MG tablet Take 1 tablet (0.25 mg total) by mouth 3 (three) times daily as needed for sleep or anxiety. 02/25/12  Yes Elsie Stain, MD  buPROPion (WELLBUTRIN) 100 MG tablet Take 100 mg by mouth 2 (two) times daily.   Yes Historical Provider, MD  calcium-vitamin D (OSCAL) 250-125 MG-UNIT per tablet Take 1 tablet by mouth 2 (two) times daily.   Yes Historical Provider, MD  diltiazem (  DILTIAZEM HCL CD) 360 MG 24 hr capsule Take 360 mg by mouth daily.   Yes Historical Provider, MD  levothyroxine (SYNTHROID, LEVOTHROID) 25 MCG tablet Take 25 mcg by mouth daily.     Yes Historical Provider, MD  loratadine (CLARITIN) 10 MG tablet Take 10 mg by mouth daily.   Yes Historical Provider, MD  Multiple Vitamin (MULTIVITAMIN) capsule Take 1 capsule by mouth daily.     Yes Historical Provider, MD   pravastatin (PRAVACHOL) 40 MG tablet Take 40 mg by mouth at bedtime.   Yes Historical Provider, MD  tiotropium (SPIRIVA) 18 MCG inhalation capsule Place 1 capsule (18 mcg total) into inhaler and inhale daily. 02/10/13 02/10/14 Yes Elsie Stain, MD  torsemide (DEMADEX) 20 MG tablet Take 20 mg by mouth Daily. 02/28/12  Yes Historical Provider, MD  levofloxacin (LEVAQUIN) 500 MG tablet Take 1 tablet (500 mg total) by mouth daily. 10/16/13   Alonza Bogus, MD  methylPREDNIsolone (MEDROL DOSPACK) 4 MG tablet follow package directions 10/16/13   Alonza Bogus, MD    Current Facility-Administered Medications  Medication Dose Route Frequency Provider Last Rate Last Dose  . 0.9 %  sodium chloride infusion  250 mL Intravenous PRN Alonza Bogus, MD      . acetaminophen (TYLENOL) tablet 650 mg  650 mg Oral Q6H PRN Alonza Bogus, MD       Or  . acetaminophen (TYLENOL) suppository 650 mg  650 mg Rectal Q6H PRN Alonza Bogus, MD      . albuterol (PROVENTIL) (2.5 MG/3ML) 0.083% nebulizer solution 2.5 mg  2.5 mg Nebulization Q6H Alonza Bogus, MD   2.5 mg at 10/16/13 0759  . albuterol (PROVENTIL) (2.5 MG/3ML) 0.083% nebulizer solution 2.5 mg  2.5 mg Nebulization Q6H PRN Alonza Bogus, MD      . ALPRAZolam Duanne Moron) tablet 0.25 mg  0.25 mg Oral TID PRN Alonza Bogus, MD   0.25 mg at 10/15/13 2345  . alum & mag hydroxide-simeth (MAALOX/MYLANTA) 200-200-20 MG/5ML suspension 30 mL  30 mL Oral Q6H PRN Alonza Bogus, MD      . atorvastatin (LIPITOR) tablet 10 mg  10 mg Oral q1800 Alonza Bogus, MD   10 mg at 10/15/13 1918  . buPROPion [facility]) tablet 100 mg  100 mg Oral BID Alonza Bogus, MD   100 mg at 10/16/13 0825  . calcium-vitamin D (OSCAL WITH D) 500-200 MG-UNIT per tablet 1 tablet  1 tablet Oral BID Alonza Bogus, MD   1 tablet at 10/16/13 0825  . cefTRIAXone (ROCEPHIN) 1 g in dextrose 5 % 50 mL IVPB  1 g Intravenous Q24H Alonza Bogus, MD   1 g at 10/15/13 1530  .  diltiazem (CARDIZEM CD) 24 hr capsule 360 mg  360 mg Oral Daily Alonza Bogus, MD   360 mg at 10/16/13 0825  . enoxaparin (LOVENOX) injection 40 mg  40 mg Subcutaneous Daily Alonza Bogus, MD   40 mg at 10/16/13 0825  . guaiFENesin (MUCINEX) 12 hr tablet 1,200 mg  1,200 mg Oral BID Alonza Bogus, MD   1,200 mg at 10/16/13 0825  . HYDROcodone-acetaminophen (NORCO/VICODIN) 5-325 MG per tablet 1-2 tablet  1-2 tablet Oral Q4H PRN Alonza Bogus, MD      . levothyroxine (SYNTHROID, LEVOTHROID) tablet 25 mcg  25 mcg Oral QAC breakfast Alonza Bogus, MD   25 mcg at 10/16/13 [phone]  . loratadine (CLARITIN) tablet 10 mg  10 mg Oral Daily Alonza Bogus, MD   10 mg at 10/16/13 0825  . ondansetron (ZOFRAN) tablet 4 mg  4 mg Oral Q6H PRN Alonza Bogus, MD       Or  . ondansetron [facility]) injection 4 mg  4 mg Intravenous Q6H PRN Alonza Bogus, MD      . predniSONE (DELTASONE) tablet 40 mg  40 mg Oral Q breakfast Alonza Bogus, MD      . sodium chloride 0.9 % injection 3 mL  3 mL Intravenous Q12H Alonza Bogus, MD   3 mL at 10/15/13 0932  . sodium chloride 0.9 % injection 3 mL  3 mL Intravenous Q12H Alonza Bogus, MD   3 mL at 10/16/13 0826  . sodium chloride 0.9 % injection 3 mL  3 mL Intravenous PRN Alonza Bogus, MD      . tiotropium [facility]) inhalation capsule 18 mcg  18 mcg Inhalation Daily Alonza Bogus, MD   18 mcg at 10/16/13 [id number]   Facility-Administered Medications Ordered in Other Encounters  Medication Dose Route Frequency Provider Last Rate Last Dose  . neomycin-polymyxin-dexameth (MAXITROL) 0.1 % ophth ointment    PRN Tonny Branch, MD   1 application at [phone]    Allergies as of 10/13/2013  . (No Known Allergies)    Family History  Problem Relation Age of Onset  . Allergies Father   . Heart disease Mother   . Rheum arthritis Mother   . Breast cancer Daughter   . Breast cancer Sister   . Cancer Maternal Aunt   . Hypertension Mother   . Stroke  Maternal Grandmother   . Stroke Sister   . Colon cancer Neg Hx     History   Social History  . Marital Status: Married    Spouse Name: N/A    Number of Children: 2  . Years of Education: N/A   Occupational History  . retired     Theme park manager   Social History Main Topics  . Smoking status: Former Smoker -- 1.00 packs/day for 50 years    Types: Cigarettes    Quit date: 09/07/2008  . Smokeless tobacco: Never Used  . Alcohol Use: No  . Drug Use: No  . Sexual Activity: Not on file   Other Topics Concern  . Not on file   Social History Narrative  . No narrative on file    Review of Systems: Gen: see HPI CV: Denies chest pain, heart palpitations, syncope, edema  Resp: +DOE, chest discomfort with laying down  GI: see HPI GU : negative MS: Denies joint pain,swelling, cramping Derm: Denies rash, itching, dry skin Psych: +anxiety Heme: Denies bruising, bleeding, and enlarged lymph nodes.  Physical Exam: Vital signs in last 24 hours: Temp:  [97.4 F (36.3 C)-97.8 F (36.6 C)] 97.6 F (36.4 C) (02/09 0547) Pulse Rate:  [91-124] 100 (02/09 0547) Resp:  [18-22] 18 (02/09 0547) BP: (129-195)/(72-88) 129/86 mmHg (02/09 0547) SpO2:  [93 %-98 %] 96 % (02/09 0759) Last BM Date: 10/15/13 General:   Alert,  Well-developed, well-nourished, pleasant and cooperative in NAD Head:  Normocephalic and atraumatic. Eyes:  Sclera clear, no icterus.   Conjunctiva pink. Ears:  Normal auditory acuity. Nose:  No deformity, discharge,  or lesions. Mouth:  No deformity or lesions, dentition normal. Neck:  Supple; no masses or thyromegaly. Lungs:  Clear throughout to auscultation.   No wheezes, crackles, or rhonchi. No acute distress. Heart:  Regular rate  and rhythm; no murmurs, clicks, rubs,  or gallops. Abdomen:  Soft, nontender and nondistended. No masses, hepatosplenomegaly or hernias noted. Normal bowel sounds, without guarding, and without rebound.   Rectal:  Deferred until time of  colonoscopy.   Msk:  Symmetrical without gross deformities. Normal posture. Extremities:  Without clubbing or edema. Neurologic:  Alert and  oriented x4;  grossly normal neurologically. Skin:  Intact without significant lesions or rashes. Cervical Nodes:  No significant cervical adenopathy. Psych:  Alert and cooperative. Normal mood and affect.  Intake/Output from previous day: 02/08 0701 - 02/09 0700 In: 44 [P.O.:790] Out: 550 [Urine:550] Intake/Output this shift:       Impression: 73 year old female admitted with COPD exacerbation and incidentally noted chronic esophageal dysphagia for the past 6 months. She notes slight worsening of overall symptoms in the past several weeks; denies odynophagia, melena, rectal bleeding, severe GERD, abdominal pain. She does have a history of non-critical Schatzki's ring non-manipulated on last EGD in 2007 by Dr. Gala Romney.  Symptoms concerning for esophageal web, ring, or stricture, possible esophagitis, doubt malignancy. Could have underlying age-related motility disorder as well.   Discussed with Dr. Oneida Alar. While inpatient, proceed with EGD/ED with Dr. Oneida Alar today. Patient received Lovenox 40 mg this morning, which should not be an issue. Should be appropriate for discharge following procedure.   Risks and benefits discussed with patient; she states understanding and desires to proceed.   Plan: EGD/ED with Dr. Oneida Alar today. NPO now.  Hopeful discharge this afternoon.   Orvil Feil, ANP-BC [facility]     LOS: 3 days    10/16/2013, 9:41 AM

## 2013-10-16 NOTE — H&P (Signed)
Primary Care Physician:  Alonza Bogus, MD Primary Gastroenterologist:  Dr. Oneida Alar  Pre-Procedure History & Physical: HPI:  [name] is a 73 y.o. female here for DYSPHAGIA.  Past Medical History  Diagnosis Date  . COPD (chronic obstructive pulmonary disease)     home O2  . Hypertension   . Hyperlipidemia   . History of tobacco abuse   . Mild depression   . Hypothyroidism   . Diastolic dysfunction   . History of nuclear stress test 09/23/2009    dipyridamole; normal pattern of perfusion; low risk scan   . CHF (congestive heart failure)     Past Surgical History  Procedure Laterality Date  . Gallbladder surgery  1977  . Total abdominal hysterectomy  1980's  . Cataract extraction w/phaco  09/08/2012    Procedure: CATARACT EXTRACTION PHACO AND INTRAOCULAR LENS PLACEMENT (IOC);  Surgeon: Tonny Branch, MD;  Location: [facility];  Service: Ophthalmology;  Laterality: Right;  CDE:19.67  . Tonsillectomy    . Right carpal tunnel release    . Cataract extraction w/phaco  09/19/2012    Procedure: CATARACT EXTRACTION PHACO AND INTRAOCULAR LENS PLACEMENT (IOC);  Surgeon: Tonny Branch, MD;  Location: [facility];  Service: Ophthalmology;  Laterality: Left;  CDE:19.83  . Transthoracic echocardiogram  09/23/2009    EF>55%; trace TR; normal LA & RA size; normal LV & RV size & systolic function   . Colonoscopy  2007    Dr. Gala Romney: internal hemorrhoids, benign polyps  . Esophagogastroduodenoscopy  2007    Dr. Gala Romney: normal esophagus, non-critical Schatzki's ring not manipulated, normal small bowel biopsy    Prior to Admission medications   Medication Sig Start Date End Date Taking? Authorizing Provider  albuterol (PROAIR HFA) 108 (90 BASE) MCG/ACT inhaler Inhale 2 puffs into the lungs every 6 (six) hours as needed. Shortness of breath   Yes Historical Provider, MD  albuterol (PROVENTIL) (2.5 MG/3ML) 0.083% nebulizer solution USE 1 VIAL IN NEBULIZER THREE TIMES DAILY. 08/25/13  Yes Elsie Stain, MD   ALPRAZolam Duanne Moron) 0.25 MG tablet Take 1 tablet (0.25 mg total) by mouth 3 (three) times daily as needed for sleep or anxiety. 02/25/12  Yes Elsie Stain, MD  buPROPion (WELLBUTRIN) 100 MG tablet Take 100 mg by mouth 2 (two) times daily.   Yes Historical Provider, MD  calcium-vitamin D (OSCAL) 250-125 MG-UNIT per tablet Take 1 tablet by mouth 2 (two) times daily.   Yes Historical Provider, MD  diltiazem (DILTIAZEM HCL CD) 360 MG 24 hr capsule Take 360 mg by mouth daily.   Yes Historical Provider, MD  levothyroxine (SYNTHROID, LEVOTHROID) 25 MCG tablet Take 25 mcg by mouth daily.     Yes Historical Provider, MD  loratadine (CLARITIN) 10 MG tablet Take 10 mg by mouth daily.   Yes Historical Provider, MD  Multiple Vitamin (MULTIVITAMIN) capsule Take 1 capsule by mouth daily.     Yes Historical Provider, MD  pravastatin (PRAVACHOL) 40 MG tablet Take 40 mg by mouth at bedtime.   Yes Historical Provider, MD  tiotropium (SPIRIVA) 18 MCG inhalation capsule Place 1 capsule (18 mcg total) into inhaler and inhale daily. 02/10/13 02/10/14 Yes Elsie Stain, MD  torsemide (DEMADEX) 20 MG tablet Take 20 mg by mouth Daily. 02/28/12  Yes Historical Provider, MD  levofloxacin (LEVAQUIN) 500 MG tablet Take 1 tablet (500 mg total) by mouth daily. 10/16/13   Alonza Bogus, MD  methylPREDNIsolone (MEDROL DOSPACK) 4 MG tablet follow package directions 10/16/13   Percell Miller  Emilie Rutter, MD    Allergies as of 10/13/2013  . (No Known Allergies)    Family History  Problem Relation Age of Onset  . Allergies Father   . Heart disease Mother   . Rheum arthritis Mother   . Breast cancer Daughter   . Breast cancer Sister   . Cancer Maternal Aunt   . Hypertension Mother   . Stroke Maternal Grandmother   . Stroke Sister   . Colon cancer Neg Hx     History   Social History  . Marital Status: Married    Spouse Name: N/A    Number of Children: 2  . Years of Education: N/A   Occupational History  . retired      Theme park manager   Social History Main Topics  . Smoking status: Former Smoker -- 1.00 packs/day for 50 years    Types: Cigarettes    Quit date: 09/07/2008  . Smokeless tobacco: Never Used  . Alcohol Use: No  . Drug Use: No  . Sexual Activity: Not on file   Other Topics Concern  . Not on file   Social History Narrative  . No narrative on file    Review of Systems: See HPI, otherwise negative ROS   Physical Exam: BP 129/86  Pulse 100  Temp(Src) 97.6 F (36.4 C) (Oral)  Resp 18  Ht 5' (1.524 m)  Wt 108 lb 7.5 oz (49.2 kg)  BMI 21.18 kg/m2  SpO2 96% General:   Alert,  pleasant and cooperative in NAD Head:  Normocephalic and atraumatic. Neck:  Supple; Lungs:  Clear throughout to auscultation.    Heart:  Regular rate and rhythm. Abdomen:  Soft, nontender and nondistended. Normal bowel sounds, without guarding, and without rebound.   Neurologic:  Alert and  oriented x4;  grossly normal neurologically.  Impression/Plan:     DYSPHAGIA  PLAN:  EGD/DIL TODAY

## 2013-10-16 NOTE — Progress Notes (Signed)
Pt discharged home today per Dr. Luan Pulling. Pt's IV site D/C'd and WNL. Pt's VS stable at this time. Pt provided with home medication list, discharge instructions, and informed where to pick up prescriptions have been called into [facility]. Pt verbalized understanding. Pt states will have medications delivered to home. Pt left floor via WC in stable condition accompanied by NT.

## 2013-10-16 NOTE — Progress Notes (Signed)
As I was discussing with her the possibility of being discharged today she told her that she's been having solid food dysphagia. I will ask for GI consultation but if she doesn't have to have anything done as an inpatient and I will let her go home

## 2013-10-16 NOTE — Op Note (Signed)
[facility] [address] [place], [address]   ENDOSCOPY PROCEDURE REPORT  PATIENT: [name], [name]  MR#: [id number] BIRTHDATE: Jun 23, 1941 , 72  yrs. old GENDER: Female  ENDOSCOPIST: Barney Drain, MD REFFERED GM:WNUUVO Luan Pulling, M.D.  Garfield Cornea, M.D.  PROCEDURE DATE:  10/16/2013 PROCEDURE:   EGD with biopsy and EGD with dilatation over guidewire   INDICATIONS:1.  dysphagia. MEDICATIONS: Demerol 50 mg IV and Versed 5 mg IV TOPICAL ANESTHETIC: Viscous Xylocaine  DESCRIPTION OF PROCEDURE:   After the risks benefits and alternatives of the procedure were thoroughly explained, informed consent was obtained.  The EG-[id number] ([id number])  endoscope was introduced through the mouth and advanced to the second portion of the duodenum. The instrument was slowly withdrawn as the mucosa was carefully examined.  Prior to withdrawal of the scope, the guidwire was placed.  The esophagus was dilated successfully.  The patient was recovered in endoscopy and discharged home in satisfactory condition.   ESOPHAGUS: A moderately severe Schatzki ring was found at the gastroesophageal junction.   STOMACH: Moderate erosive gastritis (inflammation) WITH EVEIDEMCE FOR ACTIVE OOZING was found in the PROXIMAL stomach. NON-EROSIVE GASTRITIS IN THE ANTRUM. Multiple biopsies were performed using cold forceps.   DUODENUM: The duodenal mucosa showed no abnormalities in the bulb and second portion of the duodenum.   Dilation was then performed at the gastroesphageal junction Dilator: Savary over guidewire Size(s): 12.8-16 mm Resistance: moderate Heme: none  COMPLICATIONS: There were no complications.  ENDOSCOPIC IMPRESSION: 1.   Schatzki ring at the gastroesophageal junction 2.   MODERATE Erosive gastritis  RECOMMENDATIONS: ADD DAILY PPI. ADVANCE DIET. AWAIT BIOPSY. OPV IN 3 MOS WITH DR. Gala Romney.  CONSIDER BPE IF DYSPHAGIA CONTINUES.      _______________________________ Lorrin MaisBarney Drain, MD 10/16/2013 3:13 PM

## 2013-10-16 NOTE — Discharge Instructions (Signed)
I dilated your esophagus. You have a stricture near the base of your esophagus. You have gastritis. I biopsied your stomach.   ADD PROTONIX. TAKE 30 MINUTES PRIOR TO BREAKFAST.  YOUR BIOPSY WILL BE BACK IN 7 DAYS.  FOLLOW UP IN 3 MOS WITH DR. Gala Romney. UPPER ENDOSCOPY AFTER CARE Read the instructions outlined below and refer to this sheet in the next week. These discharge instructions provide you with general information on caring for yourself after you leave the hospital. While your treatment has been planned according to the most current medical practices available, unavoidable complications occasionally occur. If you have any problems or questions after discharge, call DR. Joane Postel, [phone].  ACTIVITY  You may resume your regular activity, but move at a slower pace for the next 24 hours.   Take frequent rest periods for the next 24 hours.   Walking will help get rid of the air and reduce the bloated feeling in your belly (abdomen).   No driving for 24 hours (because of the medicine (anesthesia) used during the test).   You may shower.   Do not sign any important legal documents or operate any machinery for 24 hours (because of the anesthesia used during the test).    NUTRITION  Drink plenty of fluids.   You may resume your normal diet as instructed by your doctor.   Begin with a light meal and progress to your normal diet. Heavy or fried foods are harder to digest and may make you feel sick to your stomach (nauseated).   Avoid alcoholic beverages for 24 hours or as instructed.    MEDICATIONS  You may resume your normal medications.   WHAT YOU CAN EXPECT TODAY  Some feelings of bloating in the abdomen.   Passage of more gas than usual.    IF YOU HAD A BIOPSY TAKEN DURING THE UPPER ENDOSCOPY:  No aspirin products for 3 days.   Eat a soft diet IF YOU HAVE NAUSEA, BLOATING, ABDOMINAL PAIN, OR VOMITING.    FINDING OUT THE RESULTS OF YOUR TEST Not all test  results are available during your visit. DR. Oneida Alar WILL CALL YOU WITHIN 7 DAYS OF YOUR PROCEDUE WITH YOUR RESULTS. Do not assume everything is normal if you have not heard from DR. Amena Dockham IN ONE WEEK, CALL HER OFFICE AT [phone].  SEEK IMMEDIATE MEDICAL ATTENTION AND CALL THE OFFICE: [phone] IF:  You have more than a spotting of blood in your stool.   Your belly is swollen (abdominal distention).   You are nauseated or vomiting.   You have a temperature over 101F.   You have abdominal pain or discomfort that is severe or gets worse throughout the day.  Gastritis  Gastritis is an inflammation (the body's way of reacting to injury and/or infection) of the stomach. It is often caused by viral or bacterial (germ) infections. It can also be caused BY ASPIRIN, BC/GOODY POWDER'S, (IBUPROFEN) MOTRIN, OR ALEVE (NAPROXEN), chemicals (including alcohol), SPICY FOODS, and medications. This illness may be associated with generalized malaise (feeling tired, not well), UPPER ABDOMINAL STOMACH cramps, and fever. One common bacterial cause of gastritis is an organism known as H. Pylori. This can be treated with antibiotics.    ESOPHAGEAL STRICTURE  Esophageal strictures can be caused by stomach acid backing up into the tube that carries food from the mouth down to the stomach (lower esophagus).  TREATMENT There are a number of medicines used to treat reflux/stricture, including: Antacids.  Proton-pump inhibitors: PROTONIX OR OMEPRAZOLE  HOME CARE INSTRUCTIONS Eat 2-3 hours before going to bed.  Try to reach and maintain a healthy weight.  Do not eat just a few very large meals. Instead, eat 4 TO 6 smaller meals throughout the day.  Try to identify foods and beverages that make your symptoms worse, and avoid these.  Avoid tight clothing.  Do not exercise right after eating.

## 2013-10-16 NOTE — Clinical Documentation Improvement (Signed)
THIS DOCUMENT IS NOT A PERMANENT PART OF THE MEDICAL RECORD  Please update your documentation with the medical record to reflect your response to this query. If you need help knowing how to do this please call [phone].  10/16/13  Dear Dr. Luan Pulling Rolley Sims,  A review of the patient medical record has revealed the following indicators.  Based on your clinical judgment, please clarify and document in a progress note and/or discharge summary the clinical condition associated with the following supporting information:  Please clarify respiratory status. Thank you.   Possible Clinical Conditions?  Acute Respiratory Failure Acute on Chronic Respiratory Failure Chronic Respiratory Failure Other Condition________________ Cannot Clinically Determine    Supporting Information:  Risk Factors: History of COPD Home O2@3L /min via [place]   Signs&Symptoms: Shortness of breath Respirations  28 upon admission on 2L O2 "Resp: She has decreased breath sounds and is mildly "tight""  Treatment: O2 3L/m via [place]                  You may use possible, probable, or suspect with inpatient documentation. possible, probable, suspected diagnoses MUST be documented at the time of discharge  Reviewed: additional documentation in the medical record  Thank You,  Debora T Williams RN Clinical Documentation Specialist: [place]

## 2013-10-16 NOTE — Progress Notes (Signed)
CRITICAL VALUE ALERT  Critical value received:  K+ 2.7, CO2 44  Date of notification:  10/16/13  Time of notification:  1032  Critical value read back:yes  Nurse who received alert:  Sharyn Blitz  MD notified (1st page):  Dr. Luan Pulling  Time of first page:  1034  MD notified (2nd page):  Time of second page:  Responding MD:  Dr. Luan Pulling  Time MD responded:  [phone]

## 2013-10-17 ENCOUNTER — Encounter (HOSPITAL_COMMUNITY): Payer: Self-pay | Admitting: Gastroenterology

## 2013-10-18 ENCOUNTER — Telehealth: Payer: Self-pay | Admitting: Critical Care Medicine

## 2013-10-18 MED ORDER — ALBUTEROL SULFATE (2.5 MG/3ML) 0.083% IN NEBU: INHALATION_SOLUTION | RESPIRATORY_TRACT | Status: DC

## 2013-10-18 NOTE — Telephone Encounter (Signed)
Last ov 1.26.15 with PW: Patient Instructions     No change in medications.  Prevnar 13 vaccine was given  Return in 4 months   Albuterol neb last refilled 12.14.14, 1 vial TID #101mL, 3 refills Called spoke with patient, verified medication requested and pharmacy Refills sent, will sign off

## 2013-10-20 ENCOUNTER — Telehealth: Payer: Self-pay | Admitting: Gastroenterology

## 2013-10-20 NOTE — Telephone Encounter (Signed)
Please call pt. HER stomach Bx shows gastritis.    ADD PROTONIX. TAKE 30 MINUTES PRIOR TO BREAKFAST.  FOLLOW UP IN  WITH DR. Gala Romney MAY 5.

## 2013-10-21 DIAGNOSIS — J9620 Acute and chronic respiratory failure, unspecified whether with hypoxia or hypercapnia: Secondary | ICD-10-CM | POA: Diagnosis present

## 2013-10-21 NOTE — Discharge Summary (Signed)
Physician Discharge Summary  Patient ID: [name] MRN: [id number] DOB/AGE: 1941-07-21 73 y.o. Primary Care Physician:Robby Pirani L, MD Admit date: 10/13/2013 Discharge date: 10/21/2013    Discharge Diagnoses:   Active Problems:   COPD (chronic obstructive pulmonary disease)   COPD exacerbation   Acute-on-chronic respiratory failure  anxiety Hypothyroidism Hypertension Solid food dysphagia     Medication List         ALPRAZolam 0.25 MG tablet  Commonly known as:  XANAX  Take 1 tablet (0.25 mg total) by mouth 3 (three) times daily as needed for sleep or anxiety.     buPROPion 100 MG tablet  Commonly known as:  WELLBUTRIN  Take 100 mg by mouth 2 (two) times daily.     calcium-vitamin D 250-125 MG-UNIT per tablet  Commonly known as:  OSCAL  Take 1 tablet by mouth 2 (two) times daily.     DILTIAZEM HCL CD 360 MG 24 hr capsule  Generic drug:  diltiazem  Take 360 mg by mouth daily.     levofloxacin 500 MG tablet  Commonly known as:  LEVAQUIN  Take 1 tablet (500 mg total) by mouth daily.     levothyroxine 25 MCG tablet  Commonly known as:  SYNTHROID, LEVOTHROID  Take 25 mcg by mouth daily.     loratadine 10 MG tablet  Commonly known as:  CLARITIN  Take 10 mg by mouth daily.     methylPREDNIsolone 4 MG tablet  Commonly known as:  MEDROL DOSPACK  follow package directions     multivitamin capsule  Take 1 capsule by mouth daily.     pantoprazole 40 MG tablet  Commonly known as:  PROTONIX  Take 1 tablet (40 mg total) by mouth daily before breakfast.     pravastatin 40 MG tablet  Commonly known as:  PRAVACHOL  Take 40 mg by mouth at bedtime.     PROAIR HFA 108 (90 BASE) MCG/ACT inhaler  Generic drug:  albuterol  Inhale 2 puffs into the lungs every 6 (six) hours as needed. Shortness of breath     tiotropium 18 MCG inhalation capsule  Commonly known as:  SPIRIVA  Place 1 capsule (18 mcg total) into inhaler and inhale daily.     torsemide 20 MG tablet   Commonly known as:  DEMADEX  Take 20 mg by mouth Daily.        Discharged Condition: Improved    Consults: None  Significant Diagnostic Studies: Dg Chest Portable 1 View  10/13/2013   CLINICAL DATA:  Chest pain.  Short of breath.  EXAM: PORTABLE CHEST - 1 VIEW  COMPARISON:  DG CHEST 2 VIEW dated 09/12/2013; DG CHEST 2 VIEW dated 10/31/2010; DG CHEST 2 VIEW dated 12/12/2010  FINDINGS: Cardiopericardial silhouette within normal limits. Aortic arch atherosclerosis. There is cephalization of pulmonary blood flow and pulmonary vascular congestion consistent with volume overload. Small left pleural effusion. No focal consolidation.  IMPRESSION: Findings compatible with mild CHF with small left pleural effusion and cephalization of pulmonary blood flow.   Electronically Signed   By: Dereck Ligas M.D.   On: 10/13/2013 09:17    Lab Results: Basic Metabolic Panel: No results found for this basename: NA, K, CL, CO2, GLUCOSE, BUN, CREATININE, CALCIUM, MG, PHOS,  in the last 72 hours Liver Function Tests: No results found for this basename: AST, ALT, ALKPHOS, BILITOT, PROT, ALBUMIN,  in the last 72 hours   CBC: No results found for this basename: WBC, NEUTROABS, HGB, HCT, MCV, PLT,  in the last 72 hours  No results found for this or any previous visit (from the past 240 hour(s)).   Hospital Course: She was admitted with shortness of breath COPD exacerbation and acute on chronic respiratory failure. There was some question as to whether she had congestive heart failure based on her chest x-ray which he had echocardiogram that showed excellent systolic function and she really did not have any clinical signs of heart failure. She has been on diuretics at home chronically. She was started on IV steroids antibiotics and inhaled bronchodilators and improved. She complained of trouble swallowing and had GI consultation was set up for outpatient EGD with possible dilatation. By the time of discharge she was  back at baseline which is still short of breath with minimal exertion  Discharge Exam: Blood pressure 134/76, pulse 89, temperature 97.9 F (36.6 C), temperature source Oral, resp. rate 16, height 5' (1.524 m), weight 48.988 kg (108 lb), SpO2 99.00%. She is awake and alert. Her chest is clear. Her heart is regular.  Disposition: Home. She did not wish to have home health services      Discharge Orders   Future Appointments Provider Department Dept Phone   01/09/2014 10:00 AM [facility] [phone]   Future Orders Complete By Expires   Discharge patient  As directed       Follow-up Information   Follow up with Manus Rudd, MD On 01/09/2014. (Appointment scheduled for 10:00AM.)    Specialty:  Gastroenterology   Contact information:   [address] [place] [address] [phone]       Signed: Alonza Bogus   10/21/2013, 8:53 AM

## 2013-10-23 NOTE — Telephone Encounter (Signed)
Pt is aware. She is already taking protonix and will continue it. Pt is also aware of ov with [facility] on 01/09/14. Went over some diet information regarding gastritis. Pt verbalized understanding.

## 2013-11-06 DIAGNOSIS — F411 Generalized anxiety disorder: Secondary | ICD-10-CM | POA: Diagnosis not present

## 2013-11-06 DIAGNOSIS — J449 Chronic obstructive pulmonary disease, unspecified: Secondary | ICD-10-CM | POA: Diagnosis not present

## 2013-11-06 DIAGNOSIS — J209 Acute bronchitis, unspecified: Secondary | ICD-10-CM | POA: Diagnosis not present

## 2013-11-20 ENCOUNTER — Other Ambulatory Visit: Payer: Self-pay | Admitting: *Deleted

## 2013-11-20 MED ORDER — PRAVASTATIN SODIUM 40 MG PO TABS: 40.0000 mg | ORAL_TABLET | Freq: Every day | ORAL | Status: DC

## 2013-11-20 NOTE — Telephone Encounter (Signed)
Rx was sent to pharmacy electronically. 

## 2013-12-01 DIAGNOSIS — H5231 Anisometropia: Secondary | ICD-10-CM | POA: Diagnosis not present

## 2013-12-01 DIAGNOSIS — H52229 Regular astigmatism, unspecified eye: Secondary | ICD-10-CM | POA: Diagnosis not present

## 2013-12-01 DIAGNOSIS — H524 Presbyopia: Secondary | ICD-10-CM | POA: Diagnosis not present

## 2013-12-01 DIAGNOSIS — Z961 Presence of intraocular lens: Secondary | ICD-10-CM | POA: Diagnosis not present

## 2013-12-18 ENCOUNTER — Other Ambulatory Visit (HOSPITAL_COMMUNITY): Payer: Self-pay | Admitting: Pulmonary Disease

## 2013-12-18 ENCOUNTER — Ambulatory Visit (HOSPITAL_COMMUNITY)
Admission: RE | Admit: 2013-12-18 | Discharge: 2013-12-18 | Disposition: A | Discharge status: Home / Self Care | Payer: Medicare Other | Source: Ambulatory Visit | Attending: Pulmonary Disease | Admitting: Pulmonary Disease

## 2013-12-18 DIAGNOSIS — M5137 Other intervertebral disc degeneration, lumbosacral region: Secondary | ICD-10-CM | POA: Insufficient documentation

## 2013-12-18 DIAGNOSIS — M545 Low back pain, unspecified: Secondary | ICD-10-CM

## 2013-12-18 DIAGNOSIS — M6281 Muscle weakness (generalized): Secondary | ICD-10-CM | POA: Diagnosis not present

## 2013-12-18 DIAGNOSIS — M47817 Spondylosis without myelopathy or radiculopathy, lumbosacral region: Secondary | ICD-10-CM | POA: Diagnosis not present

## 2013-12-18 DIAGNOSIS — I10 Essential (primary) hypertension: Secondary | ICD-10-CM | POA: Diagnosis not present

## 2013-12-18 DIAGNOSIS — M51379 Other intervertebral disc degeneration, lumbosacral region without mention of lumbar back pain or lower extremity pain: Secondary | ICD-10-CM | POA: Insufficient documentation

## 2013-12-18 DIAGNOSIS — M899 Disorder of bone, unspecified: Secondary | ICD-10-CM | POA: Diagnosis not present

## 2013-12-18 DIAGNOSIS — M949 Disorder of cartilage, unspecified: Secondary | ICD-10-CM

## 2013-12-18 DIAGNOSIS — J4489 Other specified chronic obstructive pulmonary disease: Secondary | ICD-10-CM | POA: Diagnosis not present

## 2013-12-18 DIAGNOSIS — J449 Chronic obstructive pulmonary disease, unspecified: Secondary | ICD-10-CM | POA: Diagnosis not present

## 2013-12-18 DIAGNOSIS — I951 Orthostatic hypotension: Secondary | ICD-10-CM | POA: Diagnosis not present

## 2013-12-20 ENCOUNTER — Encounter: Payer: Self-pay | Admitting: *Deleted

## 2013-12-29 ENCOUNTER — Ambulatory Visit: Payer: Medicare Other | Admitting: Critical Care Medicine

## 2014-01-09 ENCOUNTER — Ambulatory Visit: Payer: Medicare Other | Admitting: Gastroenterology

## 2014-01-24 ENCOUNTER — Encounter: Payer: Self-pay | Admitting: Gastroenterology

## 2014-01-24 ENCOUNTER — Ambulatory Visit (INDEPENDENT_AMBULATORY_CARE_PROVIDER_SITE_OTHER): Payer: Medicare Other | Admitting: Gastroenterology

## 2014-01-24 VITALS — BP 128/80 | HR 110 | Temp 97.3°F | Ht 60.0 in | Wt 105.6 lb

## 2014-01-24 DIAGNOSIS — R1314 Dysphagia, pharyngoesophageal phase: Secondary | ICD-10-CM

## 2014-01-24 DIAGNOSIS — K5900 Constipation, unspecified: Secondary | ICD-10-CM | POA: Diagnosis not present

## 2014-01-24 DIAGNOSIS — E039 Hypothyroidism, unspecified: Secondary | ICD-10-CM

## 2014-01-24 DIAGNOSIS — R1319 Other dysphagia: Secondary | ICD-10-CM | POA: Insufficient documentation

## 2014-01-24 DIAGNOSIS — R634 Abnormal weight loss: Secondary | ICD-10-CM

## 2014-01-24 DIAGNOSIS — R1310 Dysphagia, unspecified: Secondary | ICD-10-CM

## 2014-01-24 LAB — TSH: TSH: 3.616 u[IU]/mL (ref 0.350–4.500)

## 2014-01-24 NOTE — Assessment & Plan Note (Addendum)
Weight down at least five pounds. Weight fluctuates per EPIC. Admits to poor appetite as well. Suspect multifactorial cause, in part due to severe lung disease. Will check TSH to make sure synthroid dose is appropriate. Encourage supplemental shakes couple per day. Increased dietary consumption. Monitor weight at home and call if loses more than 2-3 more pounds. Ov in 2 months with Dr. Gala Romney.

## 2014-01-24 NOTE — Progress Notes (Signed)
cc'd to pcp 

## 2014-01-24 NOTE — Patient Instructions (Signed)
1. Please have your blood work done. 2. If your swallowing issues return, please call me and we will order an xray of your esophagus. 3. Continue Activia and/or Miralax daily as needed for your constipation.  4. Office visit in 2 months with Dr. Gala Romney to follow up on your constipation, weight loss.

## 2014-01-24 NOTE — Progress Notes (Signed)
Primary Care Physician: Alonza Bogus, MD  Primary Gastroenterologist:  Garfield Cornea, MD   Chief Complaint  Patient presents with  . Follow-up    HPI: [name] is a 73 y.o. female here for three-month followup. She was seen in the hospital back in February 2015 for dysphagia. Admitted at that time a COPD exacerbation and heart failure. She was noted to have a Schatzki ring at the GE junction which was dilated. Also noted to have moderate erosive inflammation in the stomach with active oozing. Biopsy showed reactive gastropathy. She was started on PPI therapy.  Doing much better. For the most part her swallowing symptoms are completely resolved. Occasionally has problems getting round tablets down. Eating breads and meats okay. No heartburn, abdominal pain. Taking pantoprazole every day. BM usually very regular. But now, [place] 1-2. May go 3 days without BM. Tried Emmaline Kluver which is helping a lot. Just started within the last week however. Has tried Miralax. No melena, brbpr. Weight loss of 10 pounds, hair falling out. Appetite good but doesn't feel like cooking. Knows diet is poor. Eats a lot of frozen dinners. Son wanted her to take dietary shake, just started these.  Wt Readings from Last 3 Encounters:  01/24/14 105 lb 9.6 oz (47.9 kg)  10/16/13 108 lb (48.988 kg)  10/16/13 108 lb (48.988 kg)     Current Outpatient Prescriptions  Medication Sig Dispense Refill  . albuterol (PROAIR HFA) 108 (90 BASE) MCG/ACT inhaler Inhale 2 puffs into the lungs every 6 (six) hours as needed. Shortness of breath      . albuterol (PROVENTIL) (2.5 MG/3ML) 0.083% nebulizer solution USE 1 VIAL IN NEBULIZER THREE TIMES DAILY.  360 mL  5  . ALPRAZolam (XANAX) 0.25 MG tablet Take 1 tablet (0.25 mg total) by mouth 3 (three) times daily as needed for sleep or anxiety.  30 tablet  0  . buPROPion (WELLBUTRIN) 100 MG tablet Take 100 mg by mouth 2 (two) times daily.      . calcium-vitamin D (OSCAL)  250-125 MG-UNIT per tablet Take 1 tablet by mouth 2 (two) times daily.      Marland Kitchen diltiazem (DILTIAZEM HCL CD) 360 MG 24 hr capsule Take 360 mg by mouth daily.      Marland Kitchen levothyroxine (SYNTHROID, LEVOTHROID) 25 MCG tablet Take 25 mcg by mouth daily.        Marland Kitchen loratadine (CLARITIN) 10 MG tablet Take 10 mg by mouth daily.      . Multiple Vitamin (MULTIVITAMIN) capsule Take 1 capsule by mouth daily.        . pantoprazole (PROTONIX) 40 MG tablet Take 1 tablet (40 mg total) by mouth daily before breakfast.  30 tablet  12  . pravastatin (PRAVACHOL) 40 MG tablet Take 1 tablet (40 mg total) by mouth at bedtime.  90 tablet  1  . tiotropium (SPIRIVA) 18 MCG inhalation capsule Place 1 capsule (18 mcg total) into inhaler and inhale daily.  90 capsule  3  . torsemide (DEMADEX) 20 MG tablet Take 20 mg by mouth Daily.       No current facility-administered medications for this visit.   Facility-Administered Medications Ordered in Other Visits  Medication Dose Route Frequency Provider Last Rate Last Dose  . neomycin-polymyxin-dexameth (MAXITROL) 0.1 % ophth ointment    PRN Tonny Branch, MD   1 application at 46/96/29 1227    Allergies as of 01/24/2014  . (No Known Allergies)    ROS:  General: see  hpi. ENT: Negative for hoarseness,  nasal congestion.see hpi CV: Negative for chest pain, angina, palpitations,   peripheral edema. +DOE Respiratory: +dyspnea at rest, dyspnea on exertion. No cough, sputum, wheezing.  GI: See history of present illness. GU:  Negative for dysuria, hematuria, urinary incontinence, urinary frequency, nocturnal urination.  Endo: see hpi   Physical Examination:   BP 128/80  Pulse 110  Temp(Src) 97.3 F (36.3 C) (Oral)  Ht 5' (1.524 m)  Wt 105 lb 9.6 oz (47.9 kg)  BMI 20.62 kg/m2  General: chronically ill-appearing WF, NAD. Well-dressed. Oxygen in place Eyes: No icterus. Mouth: Oropharyngeal mucosa moist and pink , no lesions erythema or exudate. Lungs: Clear to auscultation  bilaterally.  Heart: Regular rate and rhythm, no murmurs rubs or gallops.  Abdomen: Bowel sounds are normal, nontender, nondistended, no hepatosplenomegaly or masses, no abdominal bruits or hernia , no rebound or guarding.   Extremities: No lower extremity edema. No clubbing or deformities. Neuro: Alert and oriented x 4   Skin: Warm and dry, no jaundice.   Psych: Alert and cooperative, normal mood and affect.  Labs:  Lab Results  Component Value Date   WBC 5.3 10/13/2013   HGB 13.1 10/13/2013   HCT 40.5 10/13/2013   MCV 88.8 10/13/2013   PLT 276 10/13/2013   Lab Results  Component Value Date   CREATININE 0.67 10/16/2013   BUN 21 10/16/2013   NA 142 10/16/2013   K 2.7* 10/16/2013   CL 91* 10/16/2013   CO2 44* 10/16/2013   Lab Results  Component Value Date   ALT 15 11/01/2010   AST 14 11/01/2010   ALKPHOS 84 11/01/2010   BILITOT 0.3 11/01/2010    Imaging Studies: No results found.

## 2014-01-24 NOTE — Assessment & Plan Note (Signed)
Reports recent constipation. Last TCS 2007. Patient not interested in pursing TCS at this time for change in bowel habits. Given improvement with Activia, Miralax this is reasonable. Check TSH level. Reevaluate in 2 months.

## 2014-01-24 NOTE — Progress Notes (Signed)
Quick Note:  Please let patient know her thyroid blood work looks good. ______

## 2014-01-24 NOTE — Assessment & Plan Note (Signed)
Doing better post-dilation. Some issues with small round pills. Advised to takes some sips of water before taking her meds to facilitate swallowing. If symptoms progressive, she will call and we can order barium pill esophagram to make sure Schatzki ring was completely disrupted. OV in 2 months.

## 2014-01-25 ENCOUNTER — Encounter: Payer: Self-pay | Admitting: Adult Health

## 2014-01-25 ENCOUNTER — Ambulatory Visit (INDEPENDENT_AMBULATORY_CARE_PROVIDER_SITE_OTHER): Payer: Medicare Other | Admitting: Adult Health

## 2014-01-25 VITALS — BP 144/66 | HR 99 | Temp 98.1°F | Ht 60.0 in | Wt 105.0 lb

## 2014-01-25 DIAGNOSIS — J449 Chronic obstructive pulmonary disease, unspecified: Secondary | ICD-10-CM

## 2014-01-25 MED ORDER — AZITHROMYCIN 250 MG PO TABS: ORAL_TABLET | ORAL | Status: AC

## 2014-01-25 MED ORDER — PREDNISONE 10 MG PO TABS: ORAL_TABLET | ORAL | Status: DC

## 2014-01-25 NOTE — Patient Instructions (Signed)
Zpack take as directed.  Prednisone taper over next week.  Mucinex DM Twice daily As needed  Cough/congestion .  Please contact office for sooner follow up if symptoms do not improve or worsen or seek emergency care  follow up Dr. Joya Gaskins  As planned and As needed

## 2014-01-26 NOTE — Assessment & Plan Note (Signed)
Flare with URI   Plan  Zpack take as directed.  Prednisone taper over next week.  Mucinex DM Twice daily As needed  Cough/congestion .  Please contact office for sooner follow up if symptoms do not improve or worsen or seek emergency care  follow up Dr. Joya Gaskins  As planned and As needed

## 2014-01-26 NOTE — Progress Notes (Signed)
   Subjective:    Patient ID: [name], female    DOB: [birth year], 73 y.o.   MRN: [id number]  HPI 73 yo Followed in Pulmonary clinic/ [facility]/ Joya Gaskins   - Spirometry 07/14/11 FEV1  0.39 (21%) ratio 26  - o2 dependent  01/25/14 Acute OV  Complains of increased SOB with any exertion, prod cough with light yellow mucous since yesterday. She denies any hemoptysis, orthopnea, PND, or increased leg swelling. Patient says that she once again early this time is awaiting like. Last time when things got out of hand with her breathing. Patient says, that she's had increased thick mucus is mainly yellow  Some intermittent shortness, of breath. And uses a, Mucinex..  Remains on Spiriva and O2.     Review of Systems Constitutional:   No  weight loss, night sweats,  Fevers, chills,  +fatigue, or  lassitude.  HEENT:   No headaches,  Difficulty swallowing,  Tooth/dental problems, or  Sore throat,                No sneezing, itching, ear ache, nasal congestion, post nasal drip,   CV:  No chest pain,  Orthopnea, PND, swelling in lower extremities, anasarca, dizziness, palpitations, syncope.   GI  No heartburn, indigestion, abdominal pain, nausea, vomiting, diarrhea, change in bowel habits, loss of appetite, bloody stools.   Resp:  .  No chest wall deformity  Skin: no rash or lesions.  GU: no dysuria, change in color of urine, no urgency or frequency.  No flank pain, no hematuria   MS:  No joint pain or swelling.  No decreased range of motion.  No back pain.  Psych:  No change in mood or affect. No depression or anxiety.  No memory loss.         Objective:   Physical Exam GEN: A/Ox3; pleasant , NAD, frail   HEENT:  [place]/AT,  EACs-clear, TMs-wnl, NOSE-clear, THROAT-clear, no lesions, no postnasal drip or exudate noted.   NECK:  Supple w/ fair ROM; no JVD; normal carotid impulses w/o bruits; no thyromegaly or nodules palpated; no lymphadenopathy.  RESP  Faint exp wheeze,  Decreased BS in bases , no accessory muscle use, no dullness to percussion  CARD:  RRR, no m/r/g  , no peripheral edema, pulses intact, no cyanosis or clubbing.  GI:   Soft & nt; nml bowel sounds; no organomegaly or masses detected.  Musco: Warm bil, no deformities or joint swelling noted.   Neuro: alert, no focal deficits noted.    Skin: Warm, no lesions or rashes         Assessment & Plan:

## 2014-02-02 ENCOUNTER — Ambulatory Visit (INDEPENDENT_AMBULATORY_CARE_PROVIDER_SITE_OTHER): Payer: Medicare Other | Admitting: Critical Care Medicine

## 2014-02-02 ENCOUNTER — Encounter: Payer: Self-pay | Admitting: Critical Care Medicine

## 2014-02-02 VITALS — BP 106/68 | HR 105 | Temp 97.5°F | Ht 60.0 in | Wt 103.0 lb

## 2014-02-02 DIAGNOSIS — L659 Nonscarring hair loss, unspecified: Secondary | ICD-10-CM | POA: Insufficient documentation

## 2014-02-02 DIAGNOSIS — J9610 Chronic respiratory failure, unspecified whether with hypoxia or hypercapnia: Secondary | ICD-10-CM

## 2014-02-02 DIAGNOSIS — R0902 Hypoxemia: Secondary | ICD-10-CM | POA: Diagnosis not present

## 2014-02-02 DIAGNOSIS — J9611 Chronic respiratory failure with hypoxia: Secondary | ICD-10-CM | POA: Insufficient documentation

## 2014-02-02 DIAGNOSIS — J449 Chronic obstructive pulmonary disease, unspecified: Secondary | ICD-10-CM | POA: Diagnosis not present

## 2014-02-02 MED ORDER — FLUTICASONE FUROATE-VILANTEROL 100-25 MCG/INH IN AEPB: 1.0000 | INHALATION_SPRAY | Freq: Every day | RESPIRATORY_TRACT | Status: DC

## 2014-02-02 NOTE — Patient Instructions (Signed)
Start Breo one puff daily Continue Spiriva No other changes Return 3 months

## 2014-02-02 NOTE — Progress Notes (Signed)
Subjective:    Patient ID: [name], female    DOB: 26-Jun-1941, 73 y.o.   MRN: [id number]  HPI  73 yo Followed in Pulmonary clinic/ [facility]/ Joya Gaskins   - Spirometry 07/14/11 FEV1  0.39 (21%) ratio 26  - o2 dependent   02/02/2014 Chief Complaint  Patient presents with  . COPD    Breathing is improved since last week. Reports that she still has congestion but not as much DOE.   Pt is doing better. Still some cough but is better.No real chest pain.  Dyspnea is better. Now fever. Pt took zpak and prednisone and did better Pt denies any significant sore throat, nasal congestion or excess secretions, fever, chills, sweats, unintended weight loss, pleurtic or exertional chest pain, orthopnea PND, or leg swelling Pt denies any increase in rescue therapy over baseline, denies waking up needing it or having any early am or nocturnal exacerbations of coughing/wheezing/or dyspnea. Pt also denies any obvious fluctuation in symptoms with  weather or environmental change or other alleviating or aggravating factors     Review of Systems  Constitutional:   No  weight loss, night sweats,  Fevers, chills,  +fatigue, or  lassitude.  HEENT:   No headaches,  Difficulty swallowing,  Tooth/dental problems, or  Sore throat,                No sneezing, itching, ear ache, nasal congestion, post nasal drip,   CV:  No chest pain,  Orthopnea, PND, swelling in lower extremities, anasarca, dizziness, palpitations, syncope.   GI  No heartburn, indigestion, abdominal pain, nausea, vomiting, diarrhea, change in bowel habits, loss of appetite, bloody stools.   Resp:  .  No chest wall deformity  Skin: no rash or lesions.  GU: no dysuria, change in color of urine, no urgency or frequency.  No flank pain, no hematuria   MS:  No joint pain or swelling.  No decreased range of motion.  No back pain.  Psych:  No change in mood or affect. No depression or anxiety.  No memory loss.       Objective:   Physical Exam BP 106/68  Pulse 105  Temp(Src) 97.5 F (36.4 C) (Oral)  Ht 5' (1.524 m)  Wt 103 lb (46.72 kg)  BMI 20.12 kg/m2  SpO2 95%  GEN: A/Ox3; pleasant , NAD, frail   HEENT:  /AT,  EACs-clear, TMs-wnl, NOSE-clear, THROAT-clear, no lesions, no postnasal drip or exudate noted.   NECK:  Supple w/ fair ROM; no JVD; normal carotid impulses w/o bruits; no thyromegaly or nodules palpated; no lymphadenopathy.  RESP  No wheeze, Decreased BS in bases , no accessory muscle use, no dullness to percussion  CARD:  RRR, no m/r/g  , no peripheral edema, pulses intact, no cyanosis or clubbing.  GI:   Soft & nt; nml bowel sounds; no organomegaly or masses detected.  Musco: Warm bil, no deformities or joint swelling noted.   Neuro: alert, no focal deficits noted.    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 albuterol (PROAIR HFA) 108 (90 BASE) MCG/ACT inhaler Inhale 2 puffs into the lungs every 6 (six) hours as needed.  Shortness of breath  . albuterol (PROVENTIL) (2.5 MG/3ML) 0.083% nebulizer solution USE 1 VIAL IN NEBULIZER THREE TIMES DAILY.  Marland Kitchen ALPRAZolam (XANAX) 0.25 MG tablet Take 1 tablet (0.25 mg total) by mouth 3 (three) times daily as needed for sleep or anxiety.  Marland Kitchen buPROPion (WELLBUTRIN) 100 MG tablet Take 100 mg by mouth 2 (two) times daily.  . calcium-vitamin D (OSCAL) 250-125 MG-UNIT per tablet Take 1 tablet by mouth 2 (two) times daily.  Marland Kitchen diltiazem (DILTIAZEM HCL CD) 360 MG 24 hr capsule Take 360 mg by mouth daily.  Marland Kitchen levothyroxine (SYNTHROID, LEVOTHROID) 25 MCG tablet Take 25 mcg by mouth daily.    Marland Kitchen loratadine (CLARITIN) 10 MG tablet Take 10 mg by  mouth daily.  . Multiple Vitamin (MULTIVITAMIN) capsule Take 1 capsule by mouth daily.    . pantoprazole (PROTONIX) 40 MG tablet Take 1 tablet (40 mg total) by mouth daily before breakfast.  . pravastatin (PRAVACHOL) 40 MG tablet Take 1 tablet (40 mg total) by mouth at bedtime.  Marland Kitchen tiotropium (SPIRIVA) 18 MCG inhalation capsule Place 1 capsule (18 mcg total) into inhaler and inhale daily.  Marland Kitchen torsemide (DEMADEX) 20 MG tablet Take 20 mg by mouth Daily.  . Fluticasone Furoate-Vilanterol (BREO ELLIPTA) 100-25 MCG/INH AEPB Inhale 1 puff into the lungs daily.  . [DISCONTINUED] predniSONE (DELTASONE) 10 MG tablet 4 tabs for 2 days, then 3 tabs for 2 days, 2 tabs for 2 days, then 1 tab for 2 days, then stop

## 2014-02-03 NOTE — Assessment & Plan Note (Signed)
Copd Golds D with recurrent exacerbations on Spiriva Failed Daliresp Plan Start Breo one puff daily Continue Spiriva No other changes Return 3 months

## 2014-02-18 ENCOUNTER — Encounter: Payer: Self-pay | Admitting: Critical Care Medicine

## 2014-02-19 ENCOUNTER — Encounter: Payer: Self-pay | Admitting: Critical Care Medicine

## 2014-02-20 MED ORDER — ALBUTEROL SULFATE (2.5 MG/3ML) 0.083% IN NEBU: INHALATION_SOLUTION | RESPIRATORY_TRACT | Status: DC

## 2014-02-20 MED ORDER — FLUTICASONE FUROATE-VILANTEROL 100-25 MCG/INH IN AEPB: 1.0000 | INHALATION_SPRAY | Freq: Every day | RESPIRATORY_TRACT | Status: DC

## 2014-02-20 MED ORDER — TIOTROPIUM BROMIDE MONOHYDRATE 18 MCG IN CAPS: 18.0000 ug | ORAL_CAPSULE | Freq: Every day | RESPIRATORY_TRACT | Status: DC

## 2014-02-20 NOTE — Addendum Note (Signed)
Addended by: Inge Rise on: 02/20/2014 09:26 AM   Modules accepted: Orders

## 2014-03-07 DIAGNOSIS — L650 Telogen effluvium: Secondary | ICD-10-CM | POA: Diagnosis not present

## 2014-04-04 ENCOUNTER — Encounter: Payer: Self-pay | Admitting: Internal Medicine

## 2014-04-19 ENCOUNTER — Ambulatory Visit (INDEPENDENT_AMBULATORY_CARE_PROVIDER_SITE_OTHER): Payer: Medicare Other | Admitting: Otolaryngology

## 2014-04-19 DIAGNOSIS — H6120 Impacted cerumen, unspecified ear: Secondary | ICD-10-CM

## 2014-04-19 DIAGNOSIS — H903 Sensorineural hearing loss, bilateral: Secondary | ICD-10-CM | POA: Diagnosis not present

## 2014-05-02 ENCOUNTER — Other Ambulatory Visit (HOSPITAL_COMMUNITY): Payer: Self-pay | Admitting: Pulmonary Disease

## 2014-05-02 DIAGNOSIS — Z1231 Encounter for screening mammogram for malignant neoplasm of breast: Secondary | ICD-10-CM

## 2014-05-02 DIAGNOSIS — Z139 Encounter for screening, unspecified: Secondary | ICD-10-CM

## 2014-05-03 ENCOUNTER — Other Ambulatory Visit: Payer: Self-pay | Admitting: *Deleted

## 2014-05-03 MED ORDER — PRAVASTATIN SODIUM 40 MG PO TABS: 40.0000 mg | ORAL_TABLET | Freq: Every day | ORAL | Status: DC

## 2014-05-03 NOTE — Telephone Encounter (Signed)
Rx was sent to pharmacy electronically. 

## 2014-05-07 ENCOUNTER — Ambulatory Visit (HOSPITAL_COMMUNITY): Payer: Medicare Other

## 2014-05-07 ENCOUNTER — Ambulatory Visit (HOSPITAL_COMMUNITY)
Admission: RE | Admit: 2014-05-07 | Discharge: 2014-05-07 | Disposition: A | Discharge status: Home / Self Care | Payer: Medicare Other | Source: Ambulatory Visit | Attending: Pulmonary Disease | Admitting: Pulmonary Disease

## 2014-05-07 DIAGNOSIS — Z1231 Encounter for screening mammogram for malignant neoplasm of breast: Secondary | ICD-10-CM | POA: Diagnosis not present

## 2014-05-15 ENCOUNTER — Ambulatory Visit: Payer: Medicare Other | Admitting: Gastroenterology

## 2014-05-15 ENCOUNTER — Encounter: Payer: Self-pay | Admitting: Gastroenterology

## 2014-06-07 DIAGNOSIS — Z23 Encounter for immunization: Secondary | ICD-10-CM | POA: Diagnosis not present

## 2014-06-08 ENCOUNTER — Encounter: Payer: Self-pay | Admitting: Critical Care Medicine

## 2014-06-08 NOTE — Telephone Encounter (Signed)
Please advise on pt email Dr. Joya Gaskins thanks

## 2014-06-08 NOTE — Telephone Encounter (Signed)
Form has been printed.

## 2014-06-11 ENCOUNTER — Telehealth: Payer: Self-pay | Admitting: Critical Care Medicine

## 2014-06-11 DIAGNOSIS — J449 Chronic obstructive pulmonary disease, unspecified: Secondary | ICD-10-CM

## 2014-06-11 DIAGNOSIS — J9611 Chronic respiratory failure with hypoxia: Secondary | ICD-10-CM

## 2014-06-11 NOTE — Telephone Encounter (Signed)
Pt returning call.[name] ° °

## 2014-06-11 NOTE — Telephone Encounter (Signed)
Dr. Joya Gaskins has completed the Physician's Consent Form for POC During [facility].  Original placed at front for pick up and copy placed in scan folder. Order placed for below.   [email or web address] on pt's cell phone. Spoke with family member at home # - he will ask pt to call office back as well.

## 2014-06-11 NOTE — Telephone Encounter (Signed)
[place] x 1 on pt's cell number.

## 2014-06-11 NOTE — Telephone Encounter (Signed)
Already handled per the 10.5.15 phone note. Will sign email - PW has already informed pt of pending O2 concentrator order

## 2014-06-11 NOTE — Telephone Encounter (Signed)
Pt needs an order to [facility] for portable oxygen concentrator for travel  She should be on 4L pulse during air travel

## 2014-06-11 NOTE — Telephone Encounter (Signed)
Spoke with pt - Informed her of below.  She verbalized understanding.

## 2014-06-22 ENCOUNTER — Other Ambulatory Visit: Payer: Self-pay

## 2014-08-23 ENCOUNTER — Ambulatory Visit (INDEPENDENT_AMBULATORY_CARE_PROVIDER_SITE_OTHER): Payer: Medicare Other | Admitting: Otolaryngology

## 2014-08-23 DIAGNOSIS — D2322 Other benign neoplasm of skin of left ear and external auricular canal: Secondary | ICD-10-CM

## 2014-08-23 DIAGNOSIS — H903 Sensorineural hearing loss, bilateral: Secondary | ICD-10-CM | POA: Diagnosis not present

## 2014-08-23 DIAGNOSIS — H6122 Impacted cerumen, left ear: Secondary | ICD-10-CM

## 2014-09-11 ENCOUNTER — Other Ambulatory Visit: Payer: Self-pay | Admitting: *Deleted

## 2014-09-11 MED ORDER — PRAVASTATIN SODIUM 40 MG PO TABS: 40.0000 mg | ORAL_TABLET | Freq: Every day | ORAL | Status: DC

## 2014-09-13 ENCOUNTER — Other Ambulatory Visit: Payer: Self-pay | Admitting: *Deleted

## 2014-09-13 MED ORDER — FLUTICASONE FUROATE-VILANTEROL 100-25 MCG/INH IN AEPB: 1.0000 | INHALATION_SPRAY | Freq: Every day | RESPIRATORY_TRACT | Status: DC

## 2014-09-19 DIAGNOSIS — E039 Hypothyroidism, unspecified: Secondary | ICD-10-CM | POA: Diagnosis not present

## 2014-09-19 DIAGNOSIS — I10 Essential (primary) hypertension: Secondary | ICD-10-CM | POA: Diagnosis not present

## 2014-09-19 DIAGNOSIS — Z23 Encounter for immunization: Secondary | ICD-10-CM | POA: Diagnosis not present

## 2014-09-19 DIAGNOSIS — J449 Chronic obstructive pulmonary disease, unspecified: Secondary | ICD-10-CM | POA: Diagnosis not present

## 2014-09-19 DIAGNOSIS — L97901 Non-pressure chronic ulcer of unspecified part of unspecified lower leg limited to breakdown of skin: Secondary | ICD-10-CM | POA: Diagnosis not present

## 2014-09-30 ENCOUNTER — Emergency Department (HOSPITAL_COMMUNITY): Payer: Medicare Other

## 2014-09-30 ENCOUNTER — Encounter (HOSPITAL_COMMUNITY): Payer: Self-pay | Admitting: Cardiology

## 2014-09-30 ENCOUNTER — Inpatient Hospital Stay (HOSPITAL_COMMUNITY)
Admission: EM | Admit: 2014-09-30 | Discharge: 2014-10-04 | DRG: 190 | Disposition: A | Discharge status: Home / Self Care | Payer: Medicare Other | Attending: Pulmonary Disease | Admitting: Pulmonary Disease

## 2014-09-30 DIAGNOSIS — R0602 Shortness of breath: Secondary | ICD-10-CM | POA: Diagnosis not present

## 2014-09-30 DIAGNOSIS — J9620 Acute and chronic respiratory failure, unspecified whether with hypoxia or hypercapnia: Secondary | ICD-10-CM | POA: Diagnosis present

## 2014-09-30 DIAGNOSIS — J441 Chronic obstructive pulmonary disease with (acute) exacerbation: Secondary | ICD-10-CM | POA: Diagnosis not present

## 2014-09-30 DIAGNOSIS — E873 Alkalosis: Secondary | ICD-10-CM | POA: Diagnosis not present

## 2014-09-30 DIAGNOSIS — J439 Emphysema, unspecified: Secondary | ICD-10-CM | POA: Diagnosis not present

## 2014-09-30 DIAGNOSIS — K219 Gastro-esophageal reflux disease without esophagitis: Secondary | ICD-10-CM | POA: Diagnosis not present

## 2014-09-30 DIAGNOSIS — I5030 Unspecified diastolic (congestive) heart failure: Secondary | ICD-10-CM | POA: Diagnosis not present

## 2014-09-30 DIAGNOSIS — R0600 Dyspnea, unspecified: Secondary | ICD-10-CM | POA: Diagnosis not present

## 2014-09-30 DIAGNOSIS — R1310 Dysphagia, unspecified: Secondary | ICD-10-CM | POA: Diagnosis not present

## 2014-09-30 DIAGNOSIS — R0902 Hypoxemia: Secondary | ICD-10-CM | POA: Diagnosis present

## 2014-09-30 DIAGNOSIS — R0603 Acute respiratory distress: Secondary | ICD-10-CM | POA: Diagnosis present

## 2014-09-30 DIAGNOSIS — I10 Essential (primary) hypertension: Secondary | ICD-10-CM | POA: Diagnosis present

## 2014-09-30 DIAGNOSIS — Z87891 Personal history of nicotine dependence: Secondary | ICD-10-CM

## 2014-09-30 DIAGNOSIS — G5601 Carpal tunnel syndrome, right upper limb: Secondary | ICD-10-CM | POA: Diagnosis present

## 2014-09-30 DIAGNOSIS — E039 Hypothyroidism, unspecified: Secondary | ICD-10-CM | POA: Diagnosis not present

## 2014-09-30 DIAGNOSIS — F329 Major depressive disorder, single episode, unspecified: Secondary | ICD-10-CM | POA: Diagnosis present

## 2014-09-30 DIAGNOSIS — Z90710 Acquired absence of both cervix and uterus: Secondary | ICD-10-CM

## 2014-09-30 DIAGNOSIS — Z9849 Cataract extraction status, unspecified eye: Secondary | ICD-10-CM

## 2014-09-30 DIAGNOSIS — E785 Hyperlipidemia, unspecified: Secondary | ICD-10-CM | POA: Diagnosis present

## 2014-09-30 DIAGNOSIS — J80 Acute respiratory distress syndrome: Secondary | ICD-10-CM | POA: Diagnosis not present

## 2014-09-30 DIAGNOSIS — J9621 Acute and chronic respiratory failure with hypoxia: Secondary | ICD-10-CM | POA: Diagnosis not present

## 2014-09-30 LAB — BLOOD GAS, ARTERIAL
ACID-BASE DEFICIT: 8.3 mmol/L — AB (ref 0.0–2.0)
Acid-Base Excess: 8.6 mmol/L — ABNORMAL HIGH (ref 0.0–2.0)
Bicarbonate: 32.6 mEq/L — ABNORMAL HIGH (ref 20.0–24.0)
Drawn by: 25788
O2 Content: 3 L/min
O2 SAT: 98.9 %
TCO2: 28.6 mmol/L (ref 0–100)
pCO2 arterial: 44.5 mmHg (ref 35.0–45.0)
pH, Arterial: 7.478 — ABNORMAL HIGH (ref 7.350–7.450)
pO2, Arterial: 150 mmHg — ABNORMAL HIGH (ref 80.0–100.0)

## 2014-09-30 LAB — BASIC METABOLIC PANEL
Anion gap: 11 (ref 5–15)
BUN: 24 mg/dL — AB (ref 6–23)
CO2: 36 mmol/L — AB (ref 19–32)
CREATININE: 1.02 mg/dL (ref 0.50–1.10)
Calcium: 9.1 mg/dL (ref 8.4–10.5)
Chloride: 92 mmol/L — ABNORMAL LOW (ref 96–112)
GFR calc non Af Amer: 53 mL/min — ABNORMAL LOW (ref >90)
GFR, EST AFRICAN AMERICAN: 62 mL/min — AB (ref >90)
GLUCOSE: 132 mg/dL — AB (ref 70–99)
Potassium: 2.8 mmol/L — ABNORMAL LOW (ref 3.5–5.1)
Sodium: 139 mmol/L (ref 135–145)

## 2014-09-30 LAB — CBC WITH DIFFERENTIAL/PLATELET
BASOS ABS: 0 10*3/uL (ref 0.0–0.1)
Basophils Relative: 1 % (ref 0–1)
Eosinophils Absolute: 0 10*3/uL (ref 0.0–0.7)
Eosinophils Relative: 0 % (ref 0–5)
HCT: 41 % (ref 36.0–46.0)
HEMOGLOBIN: 13.4 g/dL (ref 12.0–15.0)
Lymphocytes Relative: 10 % — ABNORMAL LOW (ref 12–46)
Lymphs Abs: 0.7 10*3/uL (ref 0.7–4.0)
MCH: 28.8 pg (ref 26.0–34.0)
MCHC: 32.7 g/dL (ref 30.0–36.0)
MCV: 88.2 fL (ref 78.0–100.0)
MONO ABS: 0.8 10*3/uL (ref 0.1–1.0)
MONOS PCT: 10 % (ref 3–12)
Neutro Abs: 5.8 10*3/uL (ref 1.7–7.7)
Neutrophils Relative %: 79 % — ABNORMAL HIGH (ref 43–77)
Platelets: 257 10*3/uL (ref 150–400)
RBC: 4.65 MIL/uL (ref 3.87–5.11)
RDW: 13.2 % (ref 11.5–15.5)
WBC: 7.3 10*3/uL (ref 4.0–10.5)

## 2014-09-30 MED ORDER — BUPROPION HCL 100 MG PO TABS
100.0000 mg | ORAL_TABLET | Freq: Two times a day (BID) | ORAL | Status: DC
  Administered 2014-10-01: 100 mg via ORAL
  Filled 2014-09-30 (×6): qty 1

## 2014-09-30 MED ORDER — FLUTICASONE FUROATE-VILANTEROL 100-25 MCG/INH IN AEPB
1.0000 | INHALATION_SPRAY | Freq: Every day | RESPIRATORY_TRACT | Status: DC
  Administered 2014-10-02: 1 via RESPIRATORY_TRACT
  Filled 2014-09-30 (×3): qty 60

## 2014-09-30 MED ORDER — SODIUM CHLORIDE 0.9 % IJ SOLN
3.0000 mL | Freq: Two times a day (BID) | INTRAMUSCULAR | Status: DC
  Administered 2014-09-30 – 2014-10-03 (×6): 3 mL via INTRAVENOUS

## 2014-09-30 MED ORDER — ONDANSETRON HCL 4 MG/2ML IJ SOLN: 4.0000 mg | Freq: Three times a day (TID) | INTRAMUSCULAR | Status: DC | PRN

## 2014-09-30 MED ORDER — IPRATROPIUM-ALBUTEROL 0.5-2.5 (3) MG/3ML IN SOLN
3.0000 mL | Freq: Once | RESPIRATORY_TRACT | Status: AC
  Administered 2014-09-30: 3 mL via RESPIRATORY_TRACT
  Filled 2014-09-30: qty 3

## 2014-09-30 MED ORDER — DEXTROSE 5 % IV SOLN
500.0000 mg | INTRAVENOUS | Status: DC
  Administered 2014-10-01 (×2): 500 mg via INTRAVENOUS
  Filled 2014-09-30 (×3): qty 500

## 2014-09-30 MED ORDER — ALBUTEROL SULFATE (2.5 MG/3ML) 0.083% IN NEBU: 2.5000 mg | INHALATION_SOLUTION | Freq: Four times a day (QID) | RESPIRATORY_TRACT | Status: DC

## 2014-09-30 MED ORDER — METHYLPREDNISOLONE SODIUM SUCC 125 MG IJ SOLR
60.0000 mg | Freq: Two times a day (BID) | INTRAMUSCULAR | Status: DC
  Administered 2014-09-30 – 2014-10-03 (×7): 60 mg via INTRAVENOUS
  Filled 2014-09-30 (×7): qty 2

## 2014-09-30 MED ORDER — ACETAMINOPHEN 325 MG PO TABS
650.0000 mg | ORAL_TABLET | Freq: Four times a day (QID) | ORAL | Status: DC | PRN
  Administered 2014-09-30 – 2014-10-03 (×6): 650 mg via ORAL
  Filled 2014-09-30 (×5): qty 2

## 2014-09-30 MED ORDER — IPRATROPIUM-ALBUTEROL 0.5-2.5 (3) MG/3ML IN SOLN: 3.0000 mL | Freq: Four times a day (QID) | RESPIRATORY_TRACT | Status: DC

## 2014-09-30 MED ORDER — DILTIAZEM HCL ER COATED BEADS 180 MG PO CP24
360.0000 mg | ORAL_CAPSULE | Freq: Every day | ORAL | Status: DC
  Administered 2014-10-01 – 2014-10-04 (×4): 360 mg via ORAL
  Filled 2014-09-30 (×4): qty 2
  Filled 2014-09-30 (×2): qty 1

## 2014-09-30 MED ORDER — ACETAMINOPHEN 650 MG RE SUPP: 650.0000 mg | Freq: Four times a day (QID) | RECTAL | Status: DC | PRN

## 2014-09-30 MED ORDER — PREDNISONE 20 MG PO TABS
40.0000 mg | ORAL_TABLET | Freq: Once | ORAL | Status: AC
  Administered 2014-09-30: 40 mg via ORAL
  Filled 2014-09-30: qty 2

## 2014-09-30 MED ORDER — ONDANSETRON HCL 4 MG PO TABS: 4.0000 mg | ORAL_TABLET | Freq: Four times a day (QID) | ORAL | Status: DC | PRN

## 2014-09-30 MED ORDER — TORSEMIDE 20 MG PO TABS
20.0000 mg | ORAL_TABLET | Freq: Every day | ORAL | Status: DC
  Administered 2014-10-01: 20 mg via ORAL
  Filled 2014-09-30 (×4): qty 1

## 2014-09-30 MED ORDER — LEVOTHYROXINE SODIUM 25 MCG PO TABS: 25.0000 ug | ORAL_TABLET | Freq: Every day | ORAL | Status: DC

## 2014-09-30 MED ORDER — PRAVASTATIN SODIUM 40 MG PO TABS
40.0000 mg | ORAL_TABLET | Freq: Every day | ORAL | Status: DC
  Administered 2014-09-30 – 2014-10-03 (×4): 40 mg via ORAL
  Filled 2014-09-30 (×6): qty 1

## 2014-09-30 MED ORDER — LEVOFLOXACIN 500 MG PO TABS
500.0000 mg | ORAL_TABLET | Freq: Once | ORAL | Status: AC
  Administered 2014-09-30: 500 mg via ORAL
  Filled 2014-09-30: qty 1

## 2014-09-30 MED ORDER — PANTOPRAZOLE SODIUM 40 MG PO TBEC
40.0000 mg | DELAYED_RELEASE_TABLET | Freq: Every day | ORAL | Status: DC
  Administered 2014-10-01 – 2014-10-04 (×4): 40 mg via ORAL
  Filled 2014-09-30 (×4): qty 1

## 2014-09-30 MED ORDER — POTASSIUM CHLORIDE 20 MEQ/15ML (10%) PO SOLN
30.0000 meq | Freq: Three times a day (TID) | ORAL | Status: AC
  Administered 2014-09-30 – 2014-10-01 (×3): 30 meq via ORAL
  Filled 2014-09-30 (×3): qty 30

## 2014-09-30 MED ORDER — IPRATROPIUM BROMIDE 0.02 % IN SOLN: 0.5000 mg | Freq: Four times a day (QID) | RESPIRATORY_TRACT | Status: DC

## 2014-09-30 MED ORDER — ALBUTEROL (5 MG/ML) CONTINUOUS INHALATION SOLN
10.0000 mg/h | INHALATION_SOLUTION | RESPIRATORY_TRACT | Status: AC
  Administered 2014-09-30: 10 mg/h via RESPIRATORY_TRACT
  Filled 2014-09-30: qty 20

## 2014-09-30 MED ORDER — ALBUTEROL SULFATE (2.5 MG/3ML) 0.083% IN NEBU
2.5000 mg | INHALATION_SOLUTION | Freq: Four times a day (QID) | RESPIRATORY_TRACT | Status: DC
  Administered 2014-09-30 – 2014-10-04 (×14): 2.5 mg via RESPIRATORY_TRACT
  Filled 2014-09-30 (×15): qty 3

## 2014-09-30 MED ORDER — TIOTROPIUM BROMIDE MONOHYDRATE 18 MCG IN CAPS
18.0000 ug | ORAL_CAPSULE | Freq: Every day | RESPIRATORY_TRACT | Status: DC
  Administered 2014-10-01 – 2014-10-04 (×4): 18 ug via RESPIRATORY_TRACT
  Filled 2014-09-30 (×2): qty 5

## 2014-09-30 MED ORDER — ALPRAZOLAM 0.25 MG PO TABS
0.2500 mg | ORAL_TABLET | Freq: Three times a day (TID) | ORAL | Status: DC | PRN
  Administered 2014-09-30 – 2014-10-03 (×6): 0.25 mg via ORAL
  Filled 2014-09-30 (×6): qty 1

## 2014-09-30 MED ORDER — DEXTROSE 5 % IV SOLN
INTRAVENOUS | Status: AC
  Filled 2014-09-30: qty 500

## 2014-09-30 MED ORDER — ONDANSETRON HCL 4 MG/2ML IJ SOLN: 4.0000 mg | Freq: Four times a day (QID) | INTRAMUSCULAR | Status: DC | PRN

## 2014-09-30 MED ORDER — GUAIFENESIN-DM 100-10 MG/5ML PO SYRP: 5.0000 mL | ORAL_SOLUTION | ORAL | Status: DC | PRN

## 2014-09-30 MED ORDER — DOCUSATE SODIUM 100 MG PO CAPS
100.0000 mg | ORAL_CAPSULE | Freq: Every day | ORAL | Status: DC | PRN
  Filled 2014-09-30: qty 1

## 2014-09-30 MED ORDER — IPRATROPIUM-ALBUTEROL 0.5-2.5 (3) MG/3ML IN SOLN
3.0000 mL | Freq: Four times a day (QID) | RESPIRATORY_TRACT | Status: DC
  Administered 2014-09-30: 3 mL via RESPIRATORY_TRACT
  Filled 2014-09-30: qty 3

## 2014-09-30 MED ORDER — ENOXAPARIN SODIUM 40 MG/0.4ML ~~LOC~~ SOLN
40.0000 mg | SUBCUTANEOUS | Status: DC
  Administered 2014-09-30 – 2014-10-02 (×3): 40 mg via SUBCUTANEOUS
  Filled 2014-09-30 (×4): qty 0.4

## 2014-09-30 MED ORDER — ALBUTEROL SULFATE (2.5 MG/3ML) 0.083% IN NEBU
2.5000 mg | INHALATION_SOLUTION | RESPIRATORY_TRACT | Status: DC | PRN
  Filled 2014-09-30: qty 3

## 2014-09-30 MED ORDER — ALUM & MAG HYDROXIDE-SIMETH 200-200-20 MG/5ML PO SUSP: 30.0000 mL | Freq: Four times a day (QID) | ORAL | Status: DC | PRN

## 2014-09-30 MED ORDER — LEVOTHYROXINE SODIUM 25 MCG PO TABS
25.0000 ug | ORAL_TABLET | Freq: Every day | ORAL | Status: DC
  Administered 2014-10-01 – 2014-10-04 (×4): 25 ug via ORAL
  Filled 2014-09-30 (×4): qty 1

## 2014-09-30 NOTE — ED Provider Notes (Signed)
CSN: [id number]     Arrival date & time 09/30/14  1228 History   First MD Initiated Contact with Patient 09/30/14 1247     Chief Complaint  Patient presents with  . Shortness of Breath     (Consider location/radiation/quality/duration/timing/severity/associated sxs/prior Treatment) HPI  The patient is a 74 year old female, she has a history of COPD, history of hypertension, has smoked in the past, has history of congestive heart failure and a normal perfusion on nuclear stress test in 2011. She states that she has had several days of worsening shortness of breath, has been using her breathing treatments at home without any significant improvement, has had persistent wheezing and now an increased cough productive of phlegm and sputum. She become severely dyspneic when she ambulates.  The patient is on 2 L of oxygen by nasal cannula. Her symptoms worsened 2 days ago and she is in severe shortness of breath. She was hypoxic prior to arrival. She required albuterol treatments prior to arrival.  Past Medical History  Diagnosis Date  . COPD (chronic obstructive pulmonary disease)     home O2  . Hypertension   . Hyperlipidemia   . History of tobacco abuse   . Mild depression   . Hypothyroidism   . Diastolic dysfunction   . History of nuclear stress test 09/23/2009    dipyridamole; normal pattern of perfusion; low risk scan   . CHF (congestive heart failure)    Past Surgical History  Procedure Laterality Date  . Gallbladder surgery  1977  . Total abdominal hysterectomy  1980's  . Cataract extraction w/phaco  09/08/2012    Procedure: CATARACT EXTRACTION PHACO AND INTRAOCULAR LENS PLACEMENT (IOC);  Surgeon: Tonny Branch, MD;  Location: [facility];  Service: Ophthalmology;  Laterality: Right;  CDE:19.67  . Tonsillectomy    . Right carpal tunnel release    . Cataract extraction w/phaco  09/19/2012    Procedure: CATARACT EXTRACTION PHACO AND INTRAOCULAR LENS PLACEMENT (IOC);  Surgeon: Tonny Branch, MD;   Location: [facility];  Service: Ophthalmology;  Laterality: Left;  CDE:19.83  . Transthoracic echocardiogram  09/23/2009    EF>55%; trace TR; normal LA & RA size; normal LV & RV size & systolic function   . Colonoscopy  2007    Dr. Gala Romney: internal hemorrhoids, benign polyps  . Esophagogastroduodenoscopy  2007    Dr. Gala Romney: normal esophagus, non-critical Schatzki's ring not manipulated, normal small bowel biopsy  . Esophagogastroduodenoscopy (egd) with esophageal dilation N/A 10/16/2013    HEN:IDPOEUMP ring at the gastroesophageal junction/MODERATE Erosive gastritis. reactive gastropathy.    Family History  Problem Relation Age of Onset  . Allergies Father   . Heart disease Mother   . Rheum arthritis Mother   . Breast cancer Daughter   . Breast cancer Sister   . Cancer Maternal Aunt   . Hypertension Mother   . Stroke Maternal Grandmother   . Stroke Sister   . Colon cancer Neg Hx    History  Substance Use Topics  . Smoking status: Former Smoker -- 1.00 packs/day for 50 years    Types: Cigarettes    Quit date: 09/07/2008  . Smokeless tobacco: Never Used  . Alcohol Use: No   OB History    No data available     Review of Systems  All other systems reviewed and are negative.     Allergies  Review of patient's allergies indicates no known allergies.  Home Medications   Prior to Admission medications   Medication Sig Start  Date End Date Taking? Authorizing Provider  albuterol (PROAIR HFA) 108 (90 BASE) MCG/ACT inhaler Inhale 2 puffs into the lungs every 6 (six) hours as needed. Shortness of breath   Yes Historical Provider, MD  albuterol (PROVENTIL) (2.5 MG/3ML) 0.083% nebulizer solution USE 1 VIAL IN NEBULIZER THREE TIMES DAILY. Patient taking differently: Take 2.5 mg by nebulization 3 (three) times daily. USE 1 VIAL IN NEBULIZER THREE TIMES DAILY. 02/20/14  Yes Elsie Stain, MD  ALPRAZolam Duanne Moron) 0.25 MG tablet Take 1 tablet (0.25 mg total) by mouth 3 (three) times daily as  needed for sleep or anxiety. 02/25/12  Yes Elsie Stain, MD  buPROPion (WELLBUTRIN) 100 MG tablet Take 100 mg by mouth 2 (two) times daily.   Yes Historical Provider, MD  calcium-vitamin D (OSCAL) 250-125 MG-UNIT per tablet Take 1 tablet by mouth 2 (two) times daily.   Yes Historical Provider, MD  diltiazem (DILTIAZEM HCL CD) 360 MG 24 hr capsule Take 360 mg by mouth daily.   Yes Historical Provider, MD  Fluticasone Furoate-Vilanterol (BREO ELLIPTA) 100-25 MCG/INH AEPB Inhale 1 puff into the lungs daily. 09/13/14  Yes Elsie Stain, MD  levothyroxine (SYNTHROID, LEVOTHROID) 25 MCG tablet Take 25 mcg by mouth daily.     Yes Historical Provider, MD  loratadine (CLARITIN) 10 MG tablet Take 10 mg by mouth daily.   Yes Historical Provider, MD  Multiple Vitamin (MULTIVITAMIN) capsule Take 1 capsule by mouth daily.     Yes Historical Provider, MD  pantoprazole (PROTONIX) 40 MG tablet Take 1 tablet (40 mg total) by mouth daily before breakfast. 10/16/13  Yes Alonza Bogus, MD  pravastatin (PRAVACHOL) 40 MG tablet Take 1 tablet (40 mg total) by mouth at bedtime. <please make appointment with Dr. Debara Pickett for refills> 09/11/14  Yes Pixie Casino, MD  tiotropium (SPIRIVA) 18 MCG inhalation capsule Place 1 capsule (18 mcg total) into inhaler and inhale daily. 02/20/14 02/20/15 Yes Elsie Stain, MD  torsemide (DEMADEX) 20 MG tablet Take 20 mg by mouth Daily. 02/28/12  Yes Historical Provider, MD   BP 140/69 mmHg  Pulse 115  Temp(Src) 98.6 F (37 C) (Oral)  Resp 18  Ht 5' (1.524 m)  Wt 110 lb (49.896 kg)  BMI 21.48 kg/m2  SpO2 100% Physical Exam  Constitutional: She appears well-developed and well-nourished. She appears distressed.  HENT:  Head: Normocephalic and atraumatic.  Mouth/Throat: Oropharynx is clear and moist. No oropharyngeal exudate.  Eyes: Conjunctivae and EOM are normal. Pupils are equal, round, and reactive to light. Right eye exhibits no discharge. Left eye exhibits no discharge. No  scleral icterus.  Neck: Normal range of motion. Neck supple. No JVD present. No thyromegaly present.  Cardiovascular: Regular rhythm, normal heart sounds and intact distal pulses.  Exam reveals no gallop and no friction rub.   No murmur heard. Tachycardic to 110 bpm  Pulmonary/Chest: She is in respiratory distress. She has wheezes. She has no rales.  The patient has diffuse expiratory wheezing, prolonged expiratory phase, speaks in shortened sentences, accessory muscles are being used. Increased work of breathing is present  Abdominal: Soft. Bowel sounds are normal. She exhibits no distension and no mass. There is no tenderness.  Musculoskeletal: Normal range of motion. She exhibits no edema or tenderness.  Lymphadenopathy:    She has no cervical adenopathy.  Neurological: She is alert. Coordination normal.  Skin: Skin is warm and dry. No rash noted. No erythema.  Psychiatric: She has a normal mood and affect. Her  behavior is normal.  Nursing note and vitals reviewed.   ED Course  Procedures (including critical care time) Labs Review Labs Reviewed  BASIC METABOLIC PANEL - Abnormal; Notable for the following:    Potassium 2.8 (*)    Chloride 92 (*)    CO2 36 (*)    Glucose, Bld 132 (*)    BUN 24 (*)    GFR calc non Af Amer 53 (*)    GFR calc Af Amer 62 (*)    All other components within normal limits  CBC WITH DIFFERENTIAL/PLATELET - Abnormal; Notable for the following:    Neutrophils Relative % 79 (*)    Lymphocytes Relative 10 (*)    All other components within normal limits  BLOOD GAS, ARTERIAL - Abnormal; Notable for the following:    pH, Arterial 7.478 (*)    pO2, Arterial 150.0 (*)    Bicarbonate 32.6 (*)    Acid-Base Excess 8.6 (*)    Acid-base deficit 8.3 (*)    All other components within normal limits    Imaging Review Dg Chest 2 View  09/30/2014   CLINICAL DATA:  Shortness of breath for 2 days. History of COPD and hypertension  EXAM: CHEST  2 VIEW  COMPARISON:   10/13/2013  FINDINGS: Normal heart size. There is calcified atherosclerotic plaque involving the thoracic aorta. The lungs are hyperinflated and there are coarsened interstitial markings compatible with emphysema. No superimposed airspace consolidation.  IMPRESSION: 1. No acute findings. 2. Emphysema.   Electronically Signed   By: Kerby Moors M.D.   On: 09/30/2014 13:33      MDM   Final diagnoses:  Respiratory distress  COPD exacerbation    The patient is in respiratory distress, her ABG shows that she has a slight alkalosis, her CO2 is within normal range, she is not hypoxic on high flow nasal cannula, when she ambulates her oxygen drops to the mid 80% range and she becomes very dyspneic. She states this is extremely abnormal for her. Labs reviewed, no other significant findings, chest x-ray reviewed, no signs of acute infiltrate or pneumothorax.  Medications given include the following:  #1 continuous albuterol treatment #2 prednisone #3 Ipratroprium #4 Levaquin  Repeat evaluation after continuous nebulizer, the patient is still tachypneic, tachycardic and in respiratory distress though improved. She will need inpatient treatment, continuous nebulizer therapy will likely be continued, will discussed with the hospitalist. The patient is critically ill with severe shortness of breath and respiratory distress.  D/w Dr. Nehemiah Settle who will admit  CRITICAL CARE Performed by: Johnna Acosta Total critical care time: 35 Critical care time was exclusive of separately billable procedures and treating other patients. Critical care was necessary to treat or prevent imminent or life-threatening deterioration. Critical care was time spent personally by me on the following activities: development of treatment plan with patient and/or surrogate as well as nursing, discussions with consultants, evaluation of patient's response to treatment, examination of patient, obtaining history from patient or  surrogate, ordering and performing treatments and interventions, ordering and review of laboratory studies, ordering and review of radiographic studies, pulse oximetry and re-evaluation of patient's condition.     Johnna Acosta, MD 09/30/14 [phone]

## 2014-09-30 NOTE — ED Notes (Signed)
Sob since Thursday.  Productive cough. Vomiting.

## 2014-09-30 NOTE — H&P (Addendum)
History and Physical  [name] IFO:[id number] DOB: [birth year] DOA: 09/30/2014  Referring physician: Dr Sabra Heck, ED physician PCP: Alonza Bogus, MD   Chief Complaint: Shortness of breath  HPI: [name] is a 74 y.o. female  With a history of COPD, hypertension, hyperlipidemia, hypothyroidism, diastolic heart failure. She is seen with three-day history of worsening shortness of breath with dyspnea on exertion to approximately 10-15 feet. Has had some increased sputum production that she describes is nonpurulent. Inhalers are somewhat helpful for her shortness of breath. No other palliating or provoking factors. BUN shortly after visiting a friend at a hospital. While the patient is stable at rest, the patient drops to the mid 80s while ambulating after receiving nebulizer treatments.   Review of Systems:   Pt denies any fevers, chills, nausea, vomiting, diarrhea, constipation, chest pain, palpitations,. Sputum production, weakness, dizziness, lightheadedness, abdominal pain.  Review of systems are otherwise negative  Past Medical History  Diagnosis Date  . COPD (chronic obstructive pulmonary disease)     home O2  . Hypertension   . Hyperlipidemia   . History of tobacco abuse   . Mild depression   . Hypothyroidism   . Diastolic dysfunction   . History of nuclear stress test 09/23/2009    dipyridamole; normal pattern of perfusion; low risk scan   . CHF (congestive heart failure)    Past Surgical History  Procedure Laterality Date  . Gallbladder surgery  1977  . Total abdominal hysterectomy  1980's  . Cataract extraction w/phaco  09/08/2012    Procedure: CATARACT EXTRACTION PHACO AND INTRAOCULAR LENS PLACEMENT (IOC);  Surgeon: Tonny Branch, MD;  Location: [facility];  Service: Ophthalmology;  Laterality: Right;  CDE:19.67  . Tonsillectomy    . Right carpal tunnel release    . Cataract extraction w/phaco  09/19/2012    Procedure: CATARACT EXTRACTION PHACO AND INTRAOCULAR LENS  PLACEMENT (IOC);  Surgeon: Tonny Branch, MD;  Location: [facility];  Service: Ophthalmology;  Laterality: Left;  CDE:19.83  . Transthoracic echocardiogram  09/23/2009    EF>55%; trace TR; normal LA & RA size; normal LV & RV size & systolic function   . Colonoscopy  2007    Dr. Gala Romney: internal hemorrhoids, benign polyps  . Esophagogastroduodenoscopy  2007    Dr. Gala Romney: normal esophagus, non-critical Schatzki's ring not manipulated, normal small bowel biopsy  . Esophagogastroduodenoscopy (egd) with esophageal dilation N/A 10/16/2013    MVE:HMCNOBSJ ring at the gastroesophageal junction/MODERATE Erosive gastritis. reactive gastropathy.    Social History:  reports that she quit smoking about 6 years ago. Her smoking use included Cigarettes. She has a 50 pack-year smoking history. She has never used smokeless tobacco. She reports that she does not drink alcohol or use illicit drugs. Patient lives at home & is able to participate in activities of daily living  No Known Allergies  Family History  Problem Relation Age of Onset  . Allergies Father   . Heart disease Mother   . Rheum arthritis Mother   . Breast cancer Daughter   . Breast cancer Sister   . Cancer Maternal Aunt   . Hypertension Mother   . Stroke Maternal Grandmother   . Stroke Sister   . Colon cancer Neg Hx       Prior to Admission medications   Medication Sig Start Date End Date Taking? Authorizing Provider  albuterol (PROAIR HFA) 108 (90 BASE) MCG/ACT inhaler Inhale 2 puffs into the lungs every 6 (six) hours as needed. Shortness of  breath   Yes Historical Provider, MD  albuterol (PROVENTIL) (2.5 MG/3ML) 0.083% nebulizer solution USE 1 VIAL IN NEBULIZER THREE TIMES DAILY. Patient taking differently: Take 2.5 mg by nebulization 3 (three) times daily. USE 1 VIAL IN NEBULIZER THREE TIMES DAILY. 02/20/14  Yes Elsie Stain, MD  ALPRAZolam Duanne Moron) 0.25 MG tablet Take 1 tablet (0.25 mg total) by mouth 3 (three) times daily as needed for  sleep or anxiety. 02/25/12  Yes Elsie Stain, MD  buPROPion (WELLBUTRIN) 100 MG tablet Take 100 mg by mouth 2 (two) times daily.   Yes Historical Provider, MD  calcium-vitamin D (OSCAL) 250-125 MG-UNIT per tablet Take 1 tablet by mouth 2 (two) times daily.   Yes Historical Provider, MD  diltiazem (DILTIAZEM HCL CD) 360 MG 24 hr capsule Take 360 mg by mouth daily.   Yes Historical Provider, MD  Fluticasone Furoate-Vilanterol (BREO ELLIPTA) 100-25 MCG/INH AEPB Inhale 1 puff into the lungs daily. 09/13/14  Yes Elsie Stain, MD  levothyroxine (SYNTHROID, LEVOTHROID) 25 MCG tablet Take 25 mcg by mouth daily.     Yes Historical Provider, MD  loratadine (CLARITIN) 10 MG tablet Take 10 mg by mouth daily.   Yes Historical Provider, MD  Multiple Vitamin (MULTIVITAMIN) capsule Take 1 capsule by mouth daily.     Yes Historical Provider, MD  pantoprazole (PROTONIX) 40 MG tablet Take 1 tablet (40 mg total) by mouth daily before breakfast. 10/16/13  Yes Alonza Bogus, MD  pravastatin (PRAVACHOL) 40 MG tablet Take 1 tablet (40 mg total) by mouth at bedtime. <please make appointment with Dr. Debara Pickett for refills> 09/11/14  Yes Pixie Casino, MD  tiotropium (SPIRIVA) 18 MCG inhalation capsule Place 1 capsule (18 mcg total) into inhaler and inhale daily. 02/20/14 02/20/15 Yes Elsie Stain, MD  torsemide (DEMADEX) 20 MG tablet Take 20 mg by mouth Daily. 02/28/12  Yes Historical Provider, MD    Physical Exam: BP 140/69 mmHg  Pulse 115  Temp(Src) 98.6 F (37 C) (Oral)  Resp 18  Ht 5' (1.524 m)  Wt 49.896 kg (110 lb)  BMI 21.48 kg/m2  SpO2 100%  General: Elderly Caucasian female. Awake and alert and oriented x3. No acute cardiopulmonary distress.  Eyes: Pupils equal, round, reactive to light. Extraocular muscles are intact. Sclerae anicteric and noninjected.  ENT:  Moist mucosal membranes. No mucosal lesions.   Neck: Neck supple without lymphadenopathy. No carotid bruits. No masses palpated.    Cardiovascular: Regular rate with normal S1-S2 sounds. No murmurs, rubs, gallops auscultated. No JVD.  Respiratory: Prolonged exhalation phase, diffuse wheezes throughout. Diminished breath sounds throughout. No rales  Abdomen: Soft, nontender, nondistended. Active bowel sounds. No masses or hepatosplenomegaly  Skin: Dry, warm to touch. 2+ dorsalis pedis and radial pulses. Musculoskeletal: No calf or leg pain. All major joints not erythematous nontender.  Psychiatric: Intact judgment and insight.  Neurologic: No focal neurological deficits. Cranial nerves II through XII are grossly intact.           Labs on Admission:  Basic Metabolic Panel:  Recent Labs Lab 09/30/14 1301  NA 139  K 2.8*  CL 92*  CO2 36*  GLUCOSE 132*  BUN 24*  CREATININE 1.02  CALCIUM 9.1   Liver Function Tests: No results for input(s): AST, ALT, ALKPHOS, BILITOT, PROT, ALBUMIN in the last 168 hours. No results for input(s): LIPASE, AMYLASE in the last 168 hours. No results for input(s): AMMONIA in the last 168 hours. CBC:  Recent Labs Lab 09/30/14 1301  WBC 7.3  NEUTROABS 5.8  HGB 13.4  HCT 41.0  MCV 88.2  PLT 257   Cardiac Enzymes: No results for input(s): CKTOTAL, CKMB, CKMBINDEX, TROPONINI in the last 168 hours.  BNP (last 3 results)  Recent Labs  10/13/13 0919  PROBNP 80.6   CBG: No results for input(s): GLUCAP in the last 168 hours.  Radiological Exams on Admission: Dg Chest 2 View  09/30/2014   CLINICAL DATA:  Shortness of breath for 2 days. History of COPD and hypertension  EXAM: CHEST  2 VIEW  COMPARISON:  10/13/2013  FINDINGS: Normal heart size. There is calcified atherosclerotic plaque involving the thoracic aorta. The lungs are hyperinflated and there are coarsened interstitial markings compatible with emphysema. No superimposed airspace consolidation.  IMPRESSION: 1. No acute findings. 2. Emphysema.   Electronically Signed   By: Kerby Moors M.D.   On: 09/30/2014 13:33      Assessment/Plan Present on Admission:  . Respiratory distress . Hypoxia . COPD with acute exacerbation  #1 hypoxia #2 respiratory distress #3 COPD with acute exacerbation #4 hypothyroidism #5 GERD #6 hypertension  Admit for observation Continue steroids Continue nebulizer treatments Change antibiotics to azithromycin Despite that the patient should be stable enough to go home tomorrow.  DVT prophylaxis: Lovenox  Consultants: None  Code Status: Full code  Family Communication: None   Disposition Plan: Home following stabilization  Time spent: 50 minutes was spent with face-to-face time with patient with at least 50% with counseling and coordination of care  Loma Boston, DO [facility] Pager [phone]

## 2014-09-30 NOTE — ED Notes (Signed)
Pt unable to complete ambulation due to SOB. O2% dropped to 86% while ambulating 75 feet.

## 2014-10-01 ENCOUNTER — Observation Stay (HOSPITAL_COMMUNITY): Payer: Medicare Other

## 2014-10-01 ENCOUNTER — Encounter (HOSPITAL_COMMUNITY): Payer: Self-pay | Admitting: Gastroenterology

## 2014-10-01 DIAGNOSIS — I10 Essential (primary) hypertension: Secondary | ICD-10-CM | POA: Diagnosis present

## 2014-10-01 DIAGNOSIS — Z9849 Cataract extraction status, unspecified eye: Secondary | ICD-10-CM | POA: Diagnosis not present

## 2014-10-01 DIAGNOSIS — R0602 Shortness of breath: Secondary | ICD-10-CM | POA: Diagnosis not present

## 2014-10-01 DIAGNOSIS — E039 Hypothyroidism, unspecified: Secondary | ICD-10-CM | POA: Diagnosis present

## 2014-10-01 DIAGNOSIS — I5030 Unspecified diastolic (congestive) heart failure: Secondary | ICD-10-CM | POA: Diagnosis present

## 2014-10-01 DIAGNOSIS — E785 Hyperlipidemia, unspecified: Secondary | ICD-10-CM | POA: Diagnosis present

## 2014-10-01 DIAGNOSIS — J9621 Acute and chronic respiratory failure with hypoxia: Secondary | ICD-10-CM | POA: Diagnosis present

## 2014-10-01 DIAGNOSIS — E873 Alkalosis: Secondary | ICD-10-CM | POA: Diagnosis present

## 2014-10-01 DIAGNOSIS — J441 Chronic obstructive pulmonary disease with (acute) exacerbation: Secondary | ICD-10-CM | POA: Insufficient documentation

## 2014-10-01 DIAGNOSIS — Z87891 Personal history of nicotine dependence: Secondary | ICD-10-CM | POA: Diagnosis not present

## 2014-10-01 DIAGNOSIS — K219 Gastro-esophageal reflux disease without esophagitis: Secondary | ICD-10-CM | POA: Diagnosis present

## 2014-10-01 DIAGNOSIS — J449 Chronic obstructive pulmonary disease, unspecified: Secondary | ICD-10-CM | POA: Diagnosis not present

## 2014-10-01 DIAGNOSIS — R1310 Dysphagia, unspecified: Secondary | ICD-10-CM | POA: Diagnosis not present

## 2014-10-01 DIAGNOSIS — Z90710 Acquired absence of both cervix and uterus: Secondary | ICD-10-CM | POA: Diagnosis not present

## 2014-10-01 DIAGNOSIS — G5601 Carpal tunnel syndrome, right upper limb: Secondary | ICD-10-CM | POA: Diagnosis present

## 2014-10-01 DIAGNOSIS — F329 Major depressive disorder, single episode, unspecified: Secondary | ICD-10-CM | POA: Diagnosis present

## 2014-10-01 LAB — BASIC METABOLIC PANEL
ANION GAP: 10 (ref 5–15)
BUN: 25 mg/dL — AB (ref 6–23)
CO2: 33 mmol/L — ABNORMAL HIGH (ref 19–32)
CREATININE: 0.87 mg/dL (ref 0.50–1.10)
Calcium: 9.2 mg/dL (ref 8.4–10.5)
Chloride: 98 mmol/L (ref 96–112)
GFR calc non Af Amer: 65 mL/min — ABNORMAL LOW (ref >90)
GFR, EST AFRICAN AMERICAN: 75 mL/min — AB (ref >90)
GLUCOSE: 148 mg/dL — AB (ref 70–99)
Potassium: 3.5 mmol/L (ref 3.5–5.1)
Sodium: 141 mmol/L (ref 135–145)

## 2014-10-01 NOTE — Care Management Note (Addendum)
    Page 1 of 1   10/04/2014     8:37:12 AM CARE MANAGEMENT NOTE 10/04/2014  Patient:  [name], [name]   Account Number:  [id number]  Date Initiated:  10/01/2014  Documentation initiated by:  Jolene Provost  Subjective/Objective Assessment:   Pt is from home, lives with husband and independent with ADL's. Pt has home O2 and neb machine through [facility]. Pt plans to discharge home with self care. No CM needs identified.     Action/Plan:   Anticipated DC Date:  10/02/2014   Anticipated DC Plan:  [place]  CM consult      Choice offered to / List presented to:             Status of service:  Completed, signed off Medicare Important Message given?  YES (If response is "NO", the following Medicare IM given date fields will be blank) Date Medicare IM given:  10/04/2014 Medicare IM given by:  Jolene Provost Date Additional Medicare IM given:   Additional Medicare IM given by:    Discharge Disposition:  HOME/SELF CARE  Per UR Regulation:    If discussed at Long Length of Stay Meetings, dates discussed:    Comments:  10/04/2014 [place], RN, MSN, CM Pt discharging home today with self care. Pt will have husband take port tank home to be refilled and bring it back for transport home. Pt has no CM needs at this time. 10/01/2014 [place], RN, MSN, CM

## 2014-10-01 NOTE — Progress Notes (Signed)
Subjective: She says she feels better. She is still weak and short of breath.  Objective: Vital signs in last 24 hours: Temp:  [98.5 F (36.9 C)-98.6 F (37 C)] 98.5 F (36.9 C) (01/24 1826) Pulse Rate:  [101-118] 101 (01/24 1826) Resp:  [16-27] 18 (01/24 1826) BP: (123-160)/(59-84) 130/68 mmHg (01/24 1826) SpO2:  [98 %-100 %] 98 % (01/24 1913) Weight:  [49.896 kg (110 lb)] 49.896 kg (110 lb) (01/24 1239) Weight change:  Last BM Date: 09/30/14  Intake/Output from previous day:    PHYSICAL EXAM General appearance: alert, cooperative and mild distress Resp: rhonchi bilaterally Cardio: regular rate and rhythm, S1, S2 normal, no murmur, click, rub or gallop GI: soft, non-tender; bowel sounds normal; no masses,  no organomegaly Extremities: extremities normal, atraumatic, no cyanosis or edema  Lab Results:  Results for orders placed or performed during the hospital encounter of 09/30/14 (from the past 48 hour(s))  Basic metabolic panel     Status: Abnormal   Collection Time: 09/30/14  1:01 PM  Result Value Ref Range   Sodium 139 135 - 145 mmol/Martin   Potassium 2.8 (Martin) 3.5 - 5.1 mmol/Martin   Chloride 92 (Martin) 96 - 112 mmol/Martin   CO2 36 (H) 19 - 32 mmol/Martin   Glucose, Bld 132 (H) 70 - 99 mg/dL   BUN 24 (H) 6 - 23 mg/dL   Creatinine, Ser 1.02 0.50 - 1.10 mg/dL   Calcium 9.1 8.4 - 10.5 mg/dL   GFR calc non Af Amer 53 (Martin) >90 mL/min   GFR calc Af Amer 62 (Martin) >90 mL/min    Comment: (NOTE) The eGFR has been calculated using the CKD EPI equation. This calculation has not been validated in all clinical situations. eGFR's persistently <90 mL/min signify possible Chronic Kidney Disease.    Anion gap 11 5 - 15  CBC with Differential     Status: Abnormal   Collection Time: 09/30/14  1:01 PM  Result Value Ref Range   WBC 7.3 4.0 - 10.5 K/uL   RBC 4.65 3.87 - 5.11 MIL/uL   Hemoglobin 13.4 12.0 - 15.0 g/dL   HCT 41.0 36.0 - 46.0 %   MCV 88.2 78.0 - 100.0 fL   MCH 28.8 26.0 - 34.0 pg   MCHC  32.7 30.0 - 36.0 g/dL   RDW 13.2 11.5 - 15.5 %   Platelets 257 150 - 400 K/uL   Neutrophils Relative % 79 (H) 43 - 77 %   Neutro Abs 5.8 1.7 - 7.7 K/uL   Lymphocytes Relative 10 (Martin) 12 - 46 %   Lymphs Abs 0.7 0.7 - 4.0 K/uL   Monocytes Relative 10 3 - 12 %   Monocytes Absolute 0.8 0.1 - 1.0 K/uL   Eosinophils Relative 0 0 - 5 %   Eosinophils Absolute 0.0 0.0 - 0.7 K/uL   Basophils Relative 1 0 - 1 %   Basophils Absolute 0.0 0.0 - 0.1 K/uL  Blood gas, arterial     Status: Abnormal   Collection Time: 09/30/14  1:16 PM  Result Value Ref Range   O2 Content 3.0 Martin/min   pH, Arterial 7.478 (H) 7.350 - 7.450   pCO2 arterial 44.5 35.0 - 45.0 mmHg   pO2, Arterial 150.0 (H) 80.0 - 100.0 mmHg   Bicarbonate 32.6 (H) 20.0 - 24.0 mEq/Martin   TCO2 28.6 0 - 100 mmol/Martin   Acid-Base Excess 8.6 (H) 0.0 - 2.0 mmol/Martin   Acid-base deficit 8.3 (H) 0.0 - 2.0 mmol/Martin  O2 Saturation 98.9 %   Collection site REVIEWED BY    Drawn by [id number]    Sample type ARTERIAL    Allens test (pass/fail) PASS PASS  Basic metabolic panel     Status: Abnormal   Collection Time: 10/01/14  5:07 AM  Result Value Ref Range   Sodium 141 135 - 145 mmol/Martin   Potassium 3.5 3.5 - 5.1 mmol/Martin    Comment: DELTA CHECK NOTED   Chloride 98 96 - 112 mmol/Martin   CO2 33 (H) 19 - 32 mmol/Martin   Glucose, Bld 148 (H) 70 - 99 mg/dL   BUN 25 (H) 6 - 23 mg/dL   Creatinine, Ser 0.87 0.50 - 1.10 mg/dL   Calcium 9.2 8.4 - 10.5 mg/dL   GFR calc non Af Amer 65 (Martin) >90 mL/min   GFR calc Af Amer 75 (Martin) >90 mL/min    Comment: (NOTE) The eGFR has been calculated using the CKD EPI equation. This calculation has not been validated in all clinical situations. eGFR's persistently <90 mL/min signify possible Chronic Kidney Disease.    Anion gap 10 5 - 15    ABGS  Recent Labs  09/30/14 1316  PHART 7.478*  PO2ART 150.0*  TCO2 28.6  HCO3 32.6*   CULTURES No results found for this or any previous visit (from the past 240 hour(s)). Studies/Results: Dg  Chest 2 View  09/30/2014   CLINICAL DATA:  Shortness of breath for 2 days. History of COPD and hypertension  EXAM: CHEST  2 VIEW  COMPARISON:  10/13/2013  FINDINGS: Normal heart size. There is calcified atherosclerotic plaque involving the thoracic aorta. The lungs are hyperinflated and there are coarsened interstitial markings compatible with emphysema. No superimposed airspace consolidation.  IMPRESSION: 1. No acute findings. 2. Emphysema.   Electronically Signed   By: Kerby Moors M.D.   On: 09/30/2014 13:33    Medications:  Prior to Admission:  Prescriptions prior to admission  Medication Sig Dispense Refill Last Dose  . albuterol (PROAIR HFA) 108 (90 BASE) MCG/ACT inhaler Inhale 2 puffs into the lungs every 6 (six) hours as needed. Shortness of breath   09/30/2014 at Unknown time  . albuterol (PROVENTIL) (2.5 MG/3ML) 0.083% nebulizer solution USE 1 VIAL IN NEBULIZER THREE TIMES DAILY. (Patient taking differently: Take 2.5 mg by nebulization 3 (three) times daily. USE 1 VIAL IN NEBULIZER THREE TIMES DAILY.) 360 mL 5 09/30/2014 at Unknown time  . ALPRAZolam (XANAX) 0.25 MG tablet Take 1 tablet (0.25 mg total) by mouth 3 (three) times daily as needed for sleep or anxiety. 30 tablet 0 09/30/2014 at Unknown time  . buPROPion (WELLBUTRIN) 100 MG tablet Take 100 mg by mouth 2 (two) times daily.   09/30/2014 at Unknown time  . calcium-vitamin D (OSCAL) 250-125 MG-UNIT per tablet Take 1 tablet by mouth 2 (two) times daily.   09/30/2014 at Unknown time  . diltiazem (DILTIAZEM HCL CD) 360 MG 24 hr capsule Take 360 mg by mouth daily.   09/30/2014 at Unknown time  . Fluticasone Furoate-Vilanterol (BREO ELLIPTA) 100-25 MCG/INH AEPB Inhale 1 puff into the lungs daily. 90 each 0 09/30/2014 at Unknown time  . levothyroxine (SYNTHROID, LEVOTHROID) 25 MCG tablet Take 25 mcg by mouth daily.     09/30/2014 at Unknown time  . loratadine (CLARITIN) 10 MG tablet Take 10 mg by mouth daily.   09/30/2014 at Unknown time  .  Multiple Vitamin (MULTIVITAMIN) capsule Take 1 capsule by mouth daily.     09/30/2014 at  Unknown time  . pantoprazole (PROTONIX) 40 MG tablet Take 1 tablet (40 mg total) by mouth daily before breakfast. 30 tablet 12 09/30/2014 at Unknown time  . pravastatin (PRAVACHOL) 40 MG tablet Take 1 tablet (40 mg total) by mouth at bedtime. <please make appointment with Dr. Debara Pickett for refills> 90 tablet 0 09/29/2014 at Unknown time  . tiotropium (SPIRIVA) 18 MCG inhalation capsule Place 1 capsule (18 mcg total) into inhaler and inhale daily. 90 capsule 3 09/30/2014 at Unknown time  . torsemide (DEMADEX) 20 MG tablet Take 20 mg by mouth Daily.   09/30/2014 at Unknown time   Scheduled: . albuterol  2.5 mg Nebulization QID  . azithromycin  500 mg Intravenous Q24H  . buPROPion  100 mg Oral BID  . diltiazem  360 mg Oral Daily  . enoxaparin (LOVENOX) injection  40 mg Subcutaneous Q24H  . Fluticasone Furoate-Vilanterol  1 puff Inhalation Daily  . levothyroxine  25 mcg Oral QAC breakfast  . methylPREDNISolone (SOLU-MEDROL) injection  60 mg Intravenous Q12H  . pantoprazole  40 mg Oral QAC breakfast  . potassium chloride  30 mEq Oral TID  . pravastatin  40 mg Oral QHS  . sodium chloride  3 mL Intravenous Q12H  . tiotropium  18 mcg Inhalation Daily  . torsemide  20 mg Oral Daily   Continuous:  KMQ:[id number] **OR** acetaminophen, albuterol, ALPRAZolam, alum & mag hydroxide-simeth, docusate sodium, guaiFENesin-dextromethorphan, ondansetron **OR** ondansetron (ZOFRAN) IV  Assesment: She was admitted with acute hypoxic respiratory failure. She has improved but she is still short of breath and weak. Active Problems:   Respiratory distress   Hypoxia   COPD with acute exacerbation    Plan: I'm going to see how she does later in the day. She may be able to be discharged if she improves if not she'll need to spend at least today in the hospital    LOS: 1 day   [name] 10/01/2014, 8:12 AM

## 2014-10-01 NOTE — Consult Note (Signed)
Referring Provider: Dr. Luan Pulling Primary Care Physician:  Dr.  Luan Pulling Primary Gastroenterologist:  Dr.Rourk   Date of Admission: 09/30/14 Date of Consultation: 10/01/14  Reason for Consultation:  Dysphagia  HPI:  TAYANA SHANKLE is a 74 y.o. year old female with a history of dysphagia secondary to Schatzki's ring; her last dilatation was in Feb 2015 during an inpatient admission. At that time, ring noted at GE junction, reactive gastropathy. She was admitted with COPD exacerbation.   Felt last night like her throat was swelling up, sore. Notes acute onset of dysphagia. States food stops in cervical area. Points to throat. Mild discomfort with swallowing. Ate a half piece of toast, egg, and 3 bites of grits for breakfast. Grits went down ok but had to use water to get down. Some nasal drainage, feels like she may have a little sore throat. States she had no esophageal dysphagia prior to admission. Protonix once daily. Feels slightly improved from admission but still with shortness of breath. No prior BPE on file.   Past Medical History  Diagnosis Date  . COPD (chronic obstructive pulmonary disease)     home O2  . Hypertension   . Hyperlipidemia   . History of tobacco abuse   . Mild depression   . Hypothyroidism   . Diastolic dysfunction   . History of nuclear stress test 09/23/2009    dipyridamole; normal pattern of perfusion; low risk scan   . CHF (congestive heart failure)     Past Surgical History  Procedure Laterality Date  . Gallbladder surgery  1977  . Total abdominal hysterectomy  1980's  . Cataract extraction w/phaco  09/08/2012    Procedure: CATARACT EXTRACTION PHACO AND INTRAOCULAR LENS PLACEMENT (IOC);  Surgeon: Tonny Branch, MD;  Location: [facility];  Service: Ophthalmology;  Laterality: Right;  CDE:19.67  . Tonsillectomy    . Right carpal tunnel release    . Cataract extraction w/phaco  09/19/2012    Procedure: CATARACT EXTRACTION PHACO AND INTRAOCULAR LENS PLACEMENT (IOC);   Surgeon: Tonny Branch, MD;  Location: [facility];  Service: Ophthalmology;  Laterality: Left;  CDE:19.83  . Transthoracic echocardiogram  09/23/2009    EF>55%; trace TR; normal LA & RA size; normal LV & RV size & systolic function   . Colonoscopy  2007    Dr. Gala Romney: internal hemorrhoids, benign polyps  . Esophagogastroduodenoscopy  2007    Dr. Gala Romney: normal esophagus, non-critical Schatzki's ring not manipulated, normal small bowel biopsy  . Esophagogastroduodenoscopy (egd) with esophageal dilation N/A 10/16/2013    Dr. Fields:Schatzki's ring at the gastroesophageal junction s/p dilation/MODERATE Erosive gastritis. reactive gastropathy.     Prior to Admission medications   Medication Sig Start Date End Date Taking? Authorizing Provider  albuterol (PROAIR HFA) 108 (90 BASE) MCG/ACT inhaler Inhale 2 puffs into the lungs every 6 (six) hours as needed. Shortness of breath   Yes Historical Provider, MD  albuterol (PROVENTIL) (2.5 MG/3ML) 0.083% nebulizer solution USE 1 VIAL IN NEBULIZER THREE TIMES DAILY. Patient taking differently: Take 2.5 mg by nebulization 3 (three) times daily. USE 1 VIAL IN NEBULIZER THREE TIMES DAILY. 02/20/14  Yes Elsie Stain, MD  ALPRAZolam Duanne Moron) 0.25 MG tablet Take 1 tablet (0.25 mg total) by mouth 3 (three) times daily as needed for sleep or anxiety. 02/25/12  Yes Elsie Stain, MD  buPROPion (WELLBUTRIN) 100 MG tablet Take 100 mg by mouth 2 (two) times daily.   Yes Historical Provider, MD  calcium-vitamin D (OSCAL) 250-125 MG-UNIT per  tablet Take 1 tablet by mouth 2 (two) times daily.   Yes Historical Provider, MD  diltiazem (DILTIAZEM HCL CD) 360 MG 24 hr capsule Take 360 mg by mouth daily.   Yes Historical Provider, MD  Fluticasone Furoate-Vilanterol (BREO ELLIPTA) 100-25 MCG/INH AEPB Inhale 1 puff into the lungs daily. 09/13/14  Yes Elsie Stain, MD  levothyroxine (SYNTHROID, LEVOTHROID) 25 MCG tablet Take 25 mcg by mouth daily.     Yes Historical Provider, MD   loratadine (CLARITIN) 10 MG tablet Take 10 mg by mouth daily.   Yes Historical Provider, MD  Multiple Vitamin (MULTIVITAMIN) capsule Take 1 capsule by mouth daily.     Yes Historical Provider, MD  pantoprazole (PROTONIX) 40 MG tablet Take 1 tablet (40 mg total) by mouth daily before breakfast. 10/16/13  Yes Alonza Bogus, MD  pravastatin (PRAVACHOL) 40 MG tablet Take 1 tablet (40 mg total) by mouth at bedtime. <please make appointment with Dr. Debara Pickett for refills> 09/11/14  Yes Pixie Casino, MD  tiotropium (SPIRIVA) 18 MCG inhalation capsule Place 1 capsule (18 mcg total) into inhaler and inhale daily. 02/20/14 02/20/15 Yes Elsie Stain, MD  torsemide (DEMADEX) 20 MG tablet Take 20 mg by mouth Daily. 02/28/12  Yes Historical Provider, MD    Current Facility-Administered Medications  Medication Dose Route Frequency Provider Last Rate Last Dose  . acetaminophen (TYLENOL) tablet 650 mg  650 mg Oral Q6H PRN Truett Mainland, DO   650 mg at 09/30/14 2141   Or  . acetaminophen (TYLENOL) suppository 650 mg  650 mg Rectal Q6H PRN Tanna Savoy Stinson, DO      . albuterol (PROVENTIL) (2.5 MG/3ML) 0.083% nebulizer solution 2.5 mg  2.5 mg Nebulization Q2H PRN Tanna Savoy Stinson, DO      . albuterol (PROVENTIL) (2.5 MG/3ML) 0.083% nebulizer solution 2.5 mg  2.5 mg Nebulization QID Tanna Savoy Stinson, DO   2.5 mg at 10/01/14 7026  . ALPRAZolam Duanne Moron) tablet 0.25 mg  0.25 mg Oral TID PRN Tanna Savoy Stinson, DO   0.25 mg at 09/30/14 2141  . alum & mag hydroxide-simeth (MAALOX/MYLANTA) 200-200-20 MG/5ML suspension 30 mL  30 mL Oral Q6H PRN Tanna Savoy Stinson, DO      . azithromycin (ZITHROMAX) 500 mg in dextrose 5 % 250 mL IVPB  500 mg Intravenous Q24H Tanna Savoy Stinson, DO   500 mg at 10/01/14 0000  . buPROPion [facility]) tablet 100 mg  100 mg Oral BID Tanna Savoy Stinson, DO   100 mg at 09/30/14 2200  . diltiazem (CARDIZEM CD) 24 hr capsule 360 mg  360 mg Oral Daily Tanna Savoy Stinson, DO   360 mg at 10/01/14 3785  . docusate  sodium (COLACE) capsule 100 mg  100 mg Oral Daily PRN Tanna Savoy Stinson, DO      . enoxaparin (LOVENOX) injection 40 mg  40 mg Subcutaneous Q24H Tanna Savoy Stinson, DO   40 mg at 09/30/14 1836  . Fluticasone Furoate-Vilanterol 100-25 MCG/INH AEPB 1 puff  1 puff Inhalation Daily Tanna Savoy Stinson, DO      . guaiFENesin-dextromethorphan (ROBITUSSIN DM) 100-10 MG/5ML syrup 5 mL  5 mL Oral Q4H PRN Tanna Savoy Stinson, DO      . levothyroxine (SYNTHROID, LEVOTHROID) tablet 25 mcg  25 mcg Oral QAC breakfast Truett Mainland, DO   25 mcg at 10/01/14 0909  . methylPREDNISolone sodium succinate (SOLU-MEDROL) 125 mg/2 mL injection 60 mg  60 mg Intravenous Q12H Truett Mainland, DO  60 mg at 09/30/14 2258  . ondansetron (ZOFRAN) tablet 4 mg  4 mg Oral Q6H PRN Tanna Savoy Stinson, DO       Or  . ondansetron [facility]) injection 4 mg  4 mg Intravenous Q6H PRN Tanna Savoy Stinson, DO      . pantoprazole (PROTONIX) EC tablet 40 mg  40 mg Oral QAC breakfast Tanna Savoy Stinson, DO   40 mg at 10/01/14 0909  . pravastatin (PRAVACHOL) tablet 40 mg  40 mg Oral QHS Tanna Savoy Stinson, DO   40 mg at 09/30/14 2141  . sodium chloride 0.9 % injection 3 mL  3 mL Intravenous Q12H Tanna Savoy Stinson, DO   3 mL at 10/01/14 0909  . tiotropium (SPIRIVA) inhalation capsule 18 mcg  18 mcg Inhalation Daily Tanna Savoy Stinson, DO      . torsemide [facility]) tablet 20 mg  20 mg Oral Daily Tanna Savoy Stinson, DO   20 mg at 10/01/14 0909   Facility-Administered Medications Ordered in Other Encounters  Medication Dose Route Frequency Provider Last Rate Last Dose  . neomycin-polymyxin-dexameth (MAXITROL) 0.1 % ophth ointment    PRN Tonny Branch, MD   1 application at 52/77/82 1227    Allergies as of 09/30/2014  . (No Known Allergies)    Family History  Problem Relation Age of Onset  . Allergies Father   . Heart disease Mother   . Rheum arthritis Mother   . Breast cancer Daughter   . Breast cancer Sister   . Cancer Maternal Aunt   . Hypertension Mother   . Stroke  Maternal Grandmother   . Stroke Sister   . Colon cancer Neg Hx     History   Social History  . Marital Status: Married    Spouse Name: N/A    Number of Children: 2  . Years of Education: N/A   Occupational History  . retired     Theme park manager   Social History Main Topics  . Smoking status: Former Smoker -- 1.00 packs/day for 50 years    Types: Cigarettes    Quit date: 09/07/2008  . Smokeless tobacco: Never Used  . Alcohol Use: No  . Drug Use: No  . Sexual Activity: Not on file   Other Topics Concern  . Not on file   Social History Narrative    Review of Systems: Gen: Denies fever, chills, loss of appetite, change in weight or weight loss CV: Denies chest pain, heart palpitations, syncope, edema  Resp: +DOE, +SOB but improved from admission.  GI: see HPI GU : Denies urinary burning, urinary frequency, urinary incontinence.  MS: Denies joint pain,swelling, cramping Derm: Denies rash, itching, dry skin Psych: Denies depression, anxiety,confusion, or memory loss Heme: Denies bruising, bleeding, and enlarged lymph nodes.  Physical Exam: Vital signs in last 24 hours: Temp:  [98.5 F (36.9 C)-98.6 F (37 C)] 98.5 F (36.9 C) (01/24 1826) Pulse Rate:  [101-123] 123 (01/25 0816) Resp:  [16-27] 24 (01/25 0816) BP: (123-171)/(59-90) 171/90 mmHg (01/25 0816) SpO2:  [96 %-100 %] 96 % (01/25 0816) Weight:  [110 lb (49.896 kg)] 110 lb (49.896 kg) (01/24 1239) Last BM Date: 10/01/14 General:   Alert,  Well-developed, well-nourished, pleasant and cooperative  Head:  Normocephalic and atraumatic. Eyes:  Sclera clear, no icterus.   Conjunctiva pink. Ears:  Normal auditory acuity. Nose:  No deformity, discharge,  or lesions. Mouth:  No deformity or lesions, dentition normal. Lungs:  Scattered rhonchi, non-labored.  Heart:  S1 S2  present without murmurs appreciated Abdomen:  Soft, nontender and nondistended. No masses, hepatosplenomegaly or hernias noted. Normal bowel sounds,  without guarding, and without rebound.   Rectal:  Deferred  Msk:  Symmetrical without gross deformities. Normal posture. Extremities:  Without edema. Neurologic:  Alert and  oriented x4;  grossly normal neurologically. Skin:  Intact without significant lesions or rashes. Cervical Nodes:  No significant cervical adenopathy. Psych:  Alert and cooperative. Normal mood and affect.  Intake/Output from previous day:   Intake/Output this shift:    Lab Results:  Recent Labs  09/30/14 1301  WBC 7.3  HGB 13.4  HCT 41.0  PLT 257   BMET  Recent Labs  09/30/14 1301 10/01/14 0507  NA 139 141  K 2.8* 3.5  CL 92* 98  CO2 36* 33*  GLUCOSE 132* 148*  BUN 24* 25*  CREATININE 1.02 0.87  CALCIUM 9.1 9.2    Studies/Results: Dg Chest 2 View  09/30/2014   CLINICAL DATA:  Shortness of breath for 2 days. History of COPD and hypertension  EXAM: CHEST  2 VIEW  COMPARISON:  10/13/2013  FINDINGS: Normal heart size. There is calcified atherosclerotic plaque involving the thoracic aorta. The lungs are hyperinflated and there are coarsened interstitial markings compatible with emphysema. No superimposed airspace consolidation.  IMPRESSION: 1. No acute findings. 2. Emphysema.   Electronically Signed   By: Kerby Moors M.D.   On: 09/30/2014 13:33    Impression: 74 year old female with history of known Schatzki's ring/dysphagia and last dilatation in Feb 2015, admitted with COPD exacerbation and noted to have acute onset of dysphagia, some mild discomfort with swallowing but without true odynophagia since yesterday. Was doing well at home prior to admission. Tolerated small amount of breakfast today, doing better with softer foods. Slight improvement since admission but still feeling short of breath. May ultimately need upper endoscopy prior to discharge but recommend holding on invasive procedure until improved to baseline for respiratory status. In interim, will order BPE for today. No prior BPE on  file. Patient agreeable to this and actually noted leaning towards outpatient evaluation for dysphagia. Will proceed with BPE now, continue PPI, and continue to follow during admission. Concern for recurrent ring, web, stricture, esophagitis in differentials.   Plan: PPI once daily BPE now Possible EGD as inpatient vs outpatient Further recommendations to follow.  Orvil Feil, ANP-BC [facility]      LOS: 1 day    10/01/2014, 9:53 AM

## 2014-10-02 ENCOUNTER — Encounter: Payer: Self-pay | Admitting: Gastroenterology

## 2014-10-02 ENCOUNTER — Encounter: Payer: Self-pay | Admitting: Critical Care Medicine

## 2014-10-02 LAB — BASIC METABOLIC PANEL
ANION GAP: 9 (ref 5–15)
BUN: 29 mg/dL — AB (ref 6–23)
CALCIUM: 9.3 mg/dL (ref 8.4–10.5)
CO2: 35 mmol/L — AB (ref 19–32)
CREATININE: 0.94 mg/dL (ref 0.50–1.10)
Chloride: 96 mmol/L (ref 96–112)
GFR calc non Af Amer: 59 mL/min — ABNORMAL LOW (ref >90)
GFR, EST AFRICAN AMERICAN: 68 mL/min — AB (ref >90)
Glucose, Bld: 154 mg/dL — ABNORMAL HIGH (ref 70–99)
Potassium: 3.6 mmol/L (ref 3.5–5.1)
Sodium: 140 mmol/L (ref 135–145)

## 2014-10-02 MED ORDER — BUPROPION HCL 75 MG PO TABS
75.0000 mg | ORAL_TABLET | Freq: Two times a day (BID) | ORAL | Status: DC
  Administered 2014-10-02 – 2014-10-04 (×5): 75 mg via ORAL
  Filled 2014-10-02 (×7): qty 1

## 2014-10-02 MED ORDER — AZITHROMYCIN 250 MG PO TABS
500.0000 mg | ORAL_TABLET | Freq: Every day | ORAL | Status: DC
  Administered 2014-10-02 – 2014-10-03 (×2): 500 mg via ORAL
  Filled 2014-10-02 (×2): qty 2

## 2014-10-02 NOTE — Progress Notes (Unsigned)
Please arrange a routine follow-up in the next 4-6 weeks.

## 2014-10-02 NOTE — Progress Notes (Signed)
Subjective: She had fairly acute problems with swallowing yesterday but had normal barium pill esophagogram. She says her swallowing is okay this morning. She is still short of breath and weak.  Objective: Vital signs in last 24 hours: Temp:  [97.7 F (36.5 C)-98 F (36.7 C)] 97.7 F (36.5 C) (01/26 0623) Pulse Rate:  [91-108] 91 (01/26 0623) Resp:  [20-26] 20 (01/26 0623) BP: (117-135)/(62-71) 117/62 mmHg (01/26 0623) SpO2:  [97 %-100 %] 98 % (01/26 0822) Weight:  [43.999 kg (97 lb)-46.947 kg (103 lb 8 oz)] 43.999 kg (97 lb) (01/26 0623) Weight change: -2.948 kg (-6 lb 8 oz) Last BM Date: 10/01/14  Intake/Output from previous day: 01/25 0701 - 01/26 0700 In: 980 [P.O.:480; IV Piggyback:500] Out: -   PHYSICAL EXAM General appearance: alert, cooperative and mild distress Resp: Diminished breath sounds bilaterally with prolonged expiration Cardio: regular rate and rhythm, S1, S2 normal, no murmur, click, rub or gallop GI: soft, non-tender; bowel sounds normal; no masses,  no organomegaly Extremities: extremities normal, atraumatic, no cyanosis or edema  Lab Results:  Results for orders placed or performed during the hospital encounter of 09/30/14 (from the past 48 hour(s))  Basic metabolic panel     Status: Abnormal   Collection Time: 09/30/14  1:01 PM  Result Value Ref Range   Sodium 139 135 - 145 mmol/L   Potassium 2.8 (L) 3.5 - 5.1 mmol/L   Chloride 92 (L) 96 - 112 mmol/L   CO2 36 (H) 19 - 32 mmol/L   Glucose, Bld 132 (H) 70 - 99 mg/dL   BUN 24 (H) 6 - 23 mg/dL   Creatinine, Ser 1.02 0.50 - 1.10 mg/dL   Calcium 9.1 8.4 - 10.5 mg/dL   GFR calc non Af Amer 53 (L) >90 mL/min   GFR calc Af Amer 62 (L) >90 mL/min    Comment: (NOTE) The eGFR has been calculated using the CKD EPI equation. This calculation has not been validated in all clinical situations. eGFR's persistently <90 mL/min signify possible Chronic Kidney Disease.    Anion gap 11 5 - 15  CBC with  Differential     Status: Abnormal   Collection Time: 09/30/14  1:01 PM  Result Value Ref Range   WBC 7.3 4.0 - 10.5 K/uL   RBC 4.65 3.87 - 5.11 MIL/uL   Hemoglobin 13.4 12.0 - 15.0 g/dL   HCT 41.0 36.0 - 46.0 %   MCV 88.2 78.0 - 100.0 fL   MCH 28.8 26.0 - 34.0 pg   MCHC 32.7 30.0 - 36.0 g/dL   RDW 13.2 11.5 - 15.5 %   Platelets 257 150 - 400 K/uL   Neutrophils Relative % 79 (H) 43 - 77 %   Neutro Abs 5.8 1.7 - 7.7 K/uL   Lymphocytes Relative 10 (L) 12 - 46 %   Lymphs Abs 0.7 0.7 - 4.0 K/uL   Monocytes Relative 10 3 - 12 %   Monocytes Absolute 0.8 0.1 - 1.0 K/uL   Eosinophils Relative 0 0 - 5 %   Eosinophils Absolute 0.0 0.0 - 0.7 K/uL   Basophils Relative 1 0 - 1 %   Basophils Absolute 0.0 0.0 - 0.1 K/uL  Blood gas, arterial     Status: Abnormal   Collection Time: 09/30/14  1:16 PM  Result Value Ref Range   O2 Content 3.0 L/min   pH, Arterial 7.478 (H) 7.350 - 7.450   pCO2 arterial 44.5 35.0 - 45.0 mmHg   pO2, Arterial 150.0 (  H) 80.0 - 100.0 mmHg   Bicarbonate 32.6 (H) 20.0 - 24.0 mEq/L   TCO2 28.6 0 - 100 mmol/L   Acid-Base Excess 8.6 (H) 0.0 - 2.0 mmol/L   Acid-base deficit 8.3 (H) 0.0 - 2.0 mmol/L   O2 Saturation 98.9 %   Collection site REVIEWED BY    Drawn by [id number]    Sample type ARTERIAL    Allens test (pass/fail) PASS PASS  Basic metabolic panel     Status: Abnormal   Collection Time: 10/01/14  5:07 AM  Result Value Ref Range   Sodium 141 135 - 145 mmol/L   Potassium 3.5 3.5 - 5.1 mmol/L    Comment: DELTA CHECK NOTED   Chloride 98 96 - 112 mmol/L   CO2 33 (H) 19 - 32 mmol/L   Glucose, Bld 148 (H) 70 - 99 mg/dL   BUN 25 (H) 6 - 23 mg/dL   Creatinine, Ser 0.87 0.50 - 1.10 mg/dL   Calcium 9.2 8.4 - 10.5 mg/dL   GFR calc non Af Amer 65 (L) >90 mL/min   GFR calc Af Amer 75 (L) >90 mL/min    Comment: (NOTE) The eGFR has been calculated using the CKD EPI equation. This calculation has not been validated in all clinical situations. eGFR's persistently <90 mL/min  signify possible Chronic Kidney Disease.    Anion gap 10 5 - 15    ABGS  Recent Labs  09/30/14 1316  PHART 7.478*  PO2ART 150.0*  TCO2 28.6  HCO3 32.6*   CULTURES No results found for this or any previous visit (from the past 240 hour(s)). Studies/Results: Dg Chest 2 View  09/30/2014   CLINICAL DATA:  Shortness of breath for 2 days. History of COPD and hypertension  EXAM: CHEST  2 VIEW  COMPARISON:  10/13/2013  FINDINGS: Normal heart size. There is calcified atherosclerotic plaque involving the thoracic aorta. The lungs are hyperinflated and there are coarsened interstitial markings compatible with emphysema. No superimposed airspace consolidation.  IMPRESSION: 1. No acute findings. 2. Emphysema.   Electronically Signed   By: Kerby Moors M.D.   On: 09/30/2014 13:33   Dg Esophagus  10/01/2014   CLINICAL DATA:  Dysphagia.  EXAM: ESOPHOGRAM/BARIUM SWALLOW  TECHNIQUE: Single contrast examination was performed using thin barium or water soluble.  FLUOROSCOPY TIME:  1 min 30 seconds.  COMPARISON:  Chest x-ray 09/30/2014.  FINDINGS: Exam is limited due to the patient's clinical condition . Cervical and thoracic esophagus are widely patent. No focal fixed lesions noted. No definite reflux.Barium tablet passed into the stomach.  IMPRESSION: No significant abnormality identified   Electronically Signed   By: Marcello Moores  Register   On: 10/01/2014 14:56    Medications:  Prior to Admission:  Prescriptions prior to admission  Medication Sig Dispense Refill Last Dose  . albuterol (PROAIR HFA) 108 (90 BASE) MCG/ACT inhaler Inhale 2 puffs into the lungs every 6 (six) hours as needed. Shortness of breath   09/30/2014 at Unknown time  . albuterol (PROVENTIL) (2.5 MG/3ML) 0.083% nebulizer solution USE 1 VIAL IN NEBULIZER THREE TIMES DAILY. (Patient taking differently: Take 2.5 mg by nebulization 3 (three) times daily. USE 1 VIAL IN NEBULIZER THREE TIMES DAILY.) 360 mL 5 09/30/2014 at Unknown time  .  ALPRAZolam (XANAX) 0.25 MG tablet Take 1 tablet (0.25 mg total) by mouth 3 (three) times daily as needed for sleep or anxiety. 30 tablet 0 09/30/2014 at Unknown time  . buPROPion (WELLBUTRIN) 100 MG tablet Take 100 mg by  mouth 2 (two) times daily.   09/30/2014 at Unknown time  . calcium-vitamin D (OSCAL) 250-125 MG-UNIT per tablet Take 1 tablet by mouth 2 (two) times daily.   09/30/2014 at Unknown time  . diltiazem (DILTIAZEM HCL CD) 360 MG 24 hr capsule Take 360 mg by mouth daily.   09/30/2014 at Unknown time  . Fluticasone Furoate-Vilanterol (BREO ELLIPTA) 100-25 MCG/INH AEPB Inhale 1 puff into the lungs daily. 90 each 0 09/30/2014 at Unknown time  . levothyroxine (SYNTHROID, LEVOTHROID) 25 MCG tablet Take 25 mcg by mouth daily.     09/30/2014 at Unknown time  . loratadine (CLARITIN) 10 MG tablet Take 10 mg by mouth daily.   09/30/2014 at Unknown time  . Multiple Vitamin (MULTIVITAMIN) capsule Take 1 capsule by mouth daily.     09/30/2014 at Unknown time  . pantoprazole (PROTONIX) 40 MG tablet Take 1 tablet (40 mg total) by mouth daily before breakfast. 30 tablet 12 09/30/2014 at Unknown time  . pravastatin (PRAVACHOL) 40 MG tablet Take 1 tablet (40 mg total) by mouth at bedtime. <please make appointment with Dr. Debara Pickett for refills> 90 tablet 0 09/29/2014 at Unknown time  . tiotropium (SPIRIVA) 18 MCG inhalation capsule Place 1 capsule (18 mcg total) into inhaler and inhale daily. 90 capsule 3 09/30/2014 at Unknown time  . torsemide (DEMADEX) 20 MG tablet Take 20 mg by mouth Daily.   09/30/2014 at Unknown time   Scheduled: . albuterol  2.5 mg Nebulization QID  . azithromycin  500 mg Intravenous Q24H  . buPROPion  75 mg Oral BID  . diltiazem  360 mg Oral Daily  . enoxaparin (LOVENOX) injection  40 mg Subcutaneous Q24H  . Fluticasone Furoate-Vilanterol  1 puff Inhalation Daily  . levothyroxine  25 mcg Oral QAC breakfast  . methylPREDNISolone (SOLU-MEDROL) injection  60 mg Intravenous Q12H  . pantoprazole   40 mg Oral QAC breakfast  . pravastatin  40 mg Oral QHS  . sodium chloride  3 mL Intravenous Q12H  . tiotropium  18 mcg Inhalation Daily   Continuous:  JYN:[id number] **OR** acetaminophen, albuterol, ALPRAZolam, alum & mag hydroxide-simeth, docusate sodium, guaiFENesin-dextromethorphan, ondansetron **OR** ondansetron (ZOFRAN) IV  Assesment: She is admitted with COPD with exacerbation and she is doing better. She has dysphagia that has resolved. I don't think she is quite ready for discharge. Active Problems:   Respiratory distress   Hypoxia   COPD with acute exacerbation   COPD exacerbation   Dysphagia    Plan: I think she needs to stay another day. Probably home tomorrow    LOS: 2 days   Alexa Golebiewski L 10/02/2014, 8:49 AM

## 2014-10-02 NOTE — Progress Notes (Signed)
    Subjective: Denies dysphagia. Denies abdominal pain. Tolerating diet.   Objective: Vital signs in last 24 hours: Temp:  [97.7 F (36.5 C)-98 F (36.7 C)] 97.7 F (36.5 C) (01/26 0623) Pulse Rate:  [91-123] 91 (01/26 0623) Resp:  [20-26] 20 (01/26 0623) BP: (117-171)/(62-90) 117/62 mmHg (01/26 0623) SpO2:  [96 %-100 %] 100 % (01/26 0623) Weight:  [97 lb (43.999 kg)-103 lb 8 oz (46.947 kg)] 97 lb (43.999 kg) (01/26 0623) Last BM Date: 10/01/14 General:   Alert and oriented, pleasant Head:  Normocephalic and atraumatic. Abdomen:  Bowel sounds present, soft, non-tender, non-distended. No HSM or hernias noted. No rebound or guarding. No masses appreciated  Extremities:  Without edema. Neurologic:  Alert and  oriented x4 Psych:  Alert and cooperative. Normal mood and affect.  Intake/Output from previous day: 01/25 0701 - 01/26 0700 In: 980 [P.O.:480; IV Piggyback:500] Out: -  Intake/Output this shift:    Lab Results:  Recent Labs  09/30/14 1301  WBC 7.3  HGB 13.4  HCT 41.0  PLT 257   BMET  Recent Labs  09/30/14 1301 10/01/14 0507  NA 139 141  K 2.8* 3.5  CL 92* 98  CO2 36* 33*  GLUCOSE 132* 148*  BUN 24* 25*  CREATININE 1.02 0.87  CALCIUM 9.1 9.2     Studies/Results: Dg Chest 2 View  09/30/2014   CLINICAL DATA:  Shortness of breath for 2 days. History of COPD and hypertension  EXAM: CHEST  2 VIEW  COMPARISON:  10/13/2013  FINDINGS: Normal heart size. There is calcified atherosclerotic plaque involving the thoracic aorta. The lungs are hyperinflated and there are coarsened interstitial markings compatible with emphysema. No superimposed airspace consolidation.  IMPRESSION: 1. No acute findings. 2. Emphysema.   Electronically Signed   By: Kerby Moors M.D.   On: 09/30/2014 13:33   Dg Esophagus  10/01/2014   CLINICAL DATA:  Dysphagia.  EXAM: ESOPHOGRAM/BARIUM SWALLOW  TECHNIQUE: Single contrast examination was performed using thin barium or water soluble.   FLUOROSCOPY TIME:  1 min 30 seconds.  COMPARISON:  Chest x-ray 09/30/2014.  FINDINGS: Exam is limited due to the patient's clinical condition . Cervical and thoracic esophagus are widely patent. No focal fixed lesions noted. No definite reflux.Barium tablet passed into the stomach.  IMPRESSION: No significant abnormality identified   Electronically Signed   By: Marcello Moores  Register   On: 10/01/2014 14:56    Assessment: 74 year old female with history of known Schatzki's ring/dysphagia and last dilatation in Feb 2015, admitted with COPD exacerbation and noted to have acute onset of dysphagia, some mild discomfort with swallowing but without true odynophagia. BPE performed noted no abnormality, tablet passed without difficulty. Now notes complete resolution of symptoms. If any recurrent dysphagia, consider repeat EGD/ED, otherwise, we will sign off. Hopeful discharge in next 24 hours.     Plan: PPI once daily Follow-up as outpatient Signing off.   Orvil Feil, ANP-BC [facility]    LOS: 2 days    10/02/2014, 7:56 AM

## 2014-10-02 NOTE — Progress Notes (Signed)
PHARMACIST - PHYSICIAN COMMUNICATION DR:   Luan Pulling CONCERNING: Antibiotic IV to Oral Route Change Policy  RECOMMENDATION: This patient is receiving Zithromax by the intravenous route.  Based on criteria approved by the Pharmacy and Therapeutics Committee, the antibiotic(s) is/are being converted to the equivalent oral dose form(s).  DESCRIPTION: These criteria include:  Patient being treated for a respiratory tract infection, urinary tract infection, cellulitis or clostridium difficile associated diarrhea if on metronidazole  The patient is not neutropenic and does not exhibit a GI malabsorption state  The patient is eating (either orally or via tube) and/or has been taking other orally administered medications for a least 24 hours  The patient is improving clinically and has a Tmax < 100.5  If you have questions about this conversion, please contact the Pharmacy Department  [x]   [phone] )  [facility] []   [phone] )  [facility]  []   [phone] )  [facility] []   [phone] )  [place], PharmD

## 2014-10-02 NOTE — Progress Notes (Signed)
APPOINTMENT MADE AND LETTER SENT °

## 2014-10-03 NOTE — Progress Notes (Signed)
Subjective: She says she still doesn't feel very well. She still short of breath.  Objective: Vital signs in last 24 hours: Temp:  [97.7 F (36.5 C)-98.2 F (36.8 C)] 98.2 F (36.8 C) (01/27 0700) Pulse Rate:  [87-101] 87 (01/27 0700) Resp:  [20-21] 21 (01/27 0700) BP: (142-183)/(69-82) 142/82 mmHg (01/27 0700) SpO2:  [87 %-100 %] 99 % (01/27 0710) Weight:  [44.2 kg (97 lb 7.1 oz)] 44.2 kg (97 lb 7.1 oz) (01/27 0700) Weight change: -2.747 kg (-6 lb 0.9 oz) Last BM Date: 10/01/14  Intake/Output from previous day: 01/26 0701 - 01/27 0700 In: 240 [P.O.:240] Out: -   PHYSICAL EXAM General appearance: alert, cooperative, moderate distress and She looks dyspneic at rest Resp: rhonchi bilaterally Cardio: regular rate and rhythm, S1, S2 normal, no murmur, click, rub or gallop GI: soft, non-tender; bowel sounds normal; no masses,  no organomegaly Extremities: extremities normal, atraumatic, no cyanosis or edema  Lab Results:  Results for orders placed or performed during the hospital encounter of 09/30/14 (from the past 48 hour(s))  Basic metabolic panel     Status: Abnormal   Collection Time: 10/02/14  8:19 AM  Result Value Ref Range   Sodium 140 135 - 145 mmol/L   Potassium 3.6 3.5 - 5.1 mmol/L   Chloride 96 96 - 112 mmol/L   CO2 35 (H) 19 - 32 mmol/L   Glucose, Bld 154 (H) 70 - 99 mg/dL   BUN 29 (H) 6 - 23 mg/dL   Creatinine, Ser 0.94 0.50 - 1.10 mg/dL   Calcium 9.3 8.4 - 10.5 mg/dL   GFR calc non Af Amer 59 (L) >90 mL/min   GFR calc Af Amer 68 (L) >90 mL/min    Comment: (NOTE) The eGFR has been calculated using the CKD EPI equation. This calculation has not been validated in all clinical situations. eGFR's persistently <90 mL/min signify possible Chronic Kidney Disease.    Anion gap 9 5 - 15    ABGS  Recent Labs  09/30/14 1316  PHART 7.478*  PO2ART 150.0*  TCO2 28.6  HCO3 32.6*   CULTURES No results found for this or any previous visit (from the past 240  hour(s)). Studies/Results: Dg Esophagus  10/01/2014   CLINICAL DATA:  Dysphagia.  EXAM: ESOPHOGRAM/BARIUM SWALLOW  TECHNIQUE: Single contrast examination was performed using thin barium or water soluble.  FLUOROSCOPY TIME:  1 min 30 seconds.  COMPARISON:  Chest x-ray 09/30/2014.  FINDINGS: Exam is limited due to the patient's clinical condition . Cervical and thoracic esophagus are widely patent. No focal fixed lesions noted. No definite reflux.Barium tablet passed into the stomach.  IMPRESSION: No significant abnormality identified   Electronically Signed   By: Marcello Moores  Register   On: 10/01/2014 14:56    Medications:  Prior to Admission:  Prescriptions prior to admission  Medication Sig Dispense Refill Last Dose  . albuterol (PROAIR HFA) 108 (90 BASE) MCG/ACT inhaler Inhale 2 puffs into the lungs every 6 (six) hours as needed. Shortness of breath   09/30/2014 at Unknown time  . albuterol (PROVENTIL) (2.5 MG/3ML) 0.083% nebulizer solution USE 1 VIAL IN NEBULIZER THREE TIMES DAILY. (Patient taking differently: Take 2.5 mg by nebulization 3 (three) times daily. USE 1 VIAL IN NEBULIZER THREE TIMES DAILY.) 360 mL 5 09/30/2014 at Unknown time  . ALPRAZolam (XANAX) 0.25 MG tablet Take 1 tablet (0.25 mg total) by mouth 3 (three) times daily as needed for sleep or anxiety. 30 tablet 0 09/30/2014 at Unknown time  .  buPROPion (WELLBUTRIN) 100 MG tablet Take 100 mg by mouth 2 (two) times daily.   09/30/2014 at Unknown time  . calcium-vitamin D (OSCAL) 250-125 MG-UNIT per tablet Take 1 tablet by mouth 2 (two) times daily.   09/30/2014 at Unknown time  . diltiazem (DILTIAZEM HCL CD) 360 MG 24 hr capsule Take 360 mg by mouth daily.   09/30/2014 at Unknown time  . Fluticasone Furoate-Vilanterol (BREO ELLIPTA) 100-25 MCG/INH AEPB Inhale 1 puff into the lungs daily. 90 each 0 09/30/2014 at Unknown time  . levothyroxine (SYNTHROID, LEVOTHROID) 25 MCG tablet Take 25 mcg by mouth daily.     09/30/2014 at Unknown time  .  loratadine (CLARITIN) 10 MG tablet Take 10 mg by mouth daily.   09/30/2014 at Unknown time  . Multiple Vitamin (MULTIVITAMIN) capsule Take 1 capsule by mouth daily.     09/30/2014 at Unknown time  . pantoprazole (PROTONIX) 40 MG tablet Take 1 tablet (40 mg total) by mouth daily before breakfast. 30 tablet 12 09/30/2014 at Unknown time  . pravastatin (PRAVACHOL) 40 MG tablet Take 1 tablet (40 mg total) by mouth at bedtime. <please make appointment with Dr. Debara Pickett for refills> 90 tablet 0 09/29/2014 at Unknown time  . tiotropium (SPIRIVA) 18 MCG inhalation capsule Place 1 capsule (18 mcg total) into inhaler and inhale daily. 90 capsule 3 09/30/2014 at Unknown time  . torsemide (DEMADEX) 20 MG tablet Take 20 mg by mouth Daily.   09/30/2014 at Unknown time   Scheduled: . albuterol  2.5 mg Nebulization QID  . azithromycin  500 mg Oral QHS  . buPROPion  75 mg Oral BID  . diltiazem  360 mg Oral Daily  . enoxaparin (LOVENOX) injection  40 mg Subcutaneous Q24H  . Fluticasone Furoate-Vilanterol  1 puff Inhalation Daily  . levothyroxine  25 mcg Oral QAC breakfast  . methylPREDNISolone (SOLU-MEDROL) injection  60 mg Intravenous Q12H  . pantoprazole  40 mg Oral QAC breakfast  . pravastatin  40 mg Oral QHS  . sodium chloride  3 mL Intravenous Q12H  . tiotropium  18 mcg Inhalation Daily   Continuous:  BWG:[id number] **OR** acetaminophen, albuterol, ALPRAZolam, alum & mag hydroxide-simeth, docusate sodium, guaiFENesin-dextromethorphan, ondansetron **OR** ondansetron (ZOFRAN) IV  Assesment: She was admitted with COPD exacerbation. Although I had hoped for her to be able to be discharged today she actually looks worse than yesterday. She can't go home yet. Active Problems:   Respiratory distress   Hypoxia   COPD with acute exacerbation   COPD exacerbation   Dysphagia    Plan: Continue IV steroids oral antibiotics and inhaled bronchodilators    LOS: 3 days   Aman Batley L 10/03/2014, 8:32 AM

## 2014-10-04 DIAGNOSIS — J9620 Acute and chronic respiratory failure, unspecified whether with hypoxia or hypercapnia: Secondary | ICD-10-CM | POA: Diagnosis present

## 2014-10-04 MED ORDER — METHYLPREDNISOLONE (PAK) 4 MG PO TABS: ORAL_TABLET | ORAL | Status: DC

## 2014-10-04 MED ORDER — AZITHROMYCIN 250 MG PO TABS: ORAL_TABLET | ORAL | Status: AC

## 2014-10-04 MED ORDER — BUPROPION HCL 75 MG PO TABS: 75.0000 mg | ORAL_TABLET | Freq: Two times a day (BID) | ORAL | Status: DC

## 2014-10-04 NOTE — Progress Notes (Signed)
Drucilla Schmidt discharged home with husband per MD order.  Discharge instructions reviewed and discussed with the patient, all questions and concerns answered. Copy of instructions and scripts given to patient.    Medication List    TAKE these medications        ALPRAZolam 0.25 MG tablet  Commonly known as:  XANAX  Take 1 tablet (0.25 mg total) by mouth 3 (three) times daily as needed for sleep or anxiety.     azithromycin 250 MG tablet  Commonly known as:  ZITHROMAX Z-PAK  Take 2 tablets (500 mg) on  Day 1,  followed by 1 tablet (250 mg) once daily on Days 2 through 5.     buPROPion 75 MG tablet  Commonly known as:  WELLBUTRIN  Take 1 tablet (75 mg total) by mouth 2 (two) times daily.     calcium-vitamin D 250-125 MG-UNIT per tablet  Commonly known as:  OSCAL  Take 1 tablet by mouth 2 (two) times daily.     DILTIAZEM HCL CD 360 MG 24 hr capsule  Generic drug:  diltiazem  Take 360 mg by mouth daily.     Fluticasone Furoate-Vilanterol 100-25 MCG/INH Aepb  Commonly known as:  BREO ELLIPTA  Inhale 1 puff into the lungs daily.     levothyroxine 25 MCG tablet  Commonly known as:  SYNTHROID, LEVOTHROID  Take 25 mcg by mouth daily.     loratadine 10 MG tablet  Commonly known as:  CLARITIN  Take 10 mg by mouth daily.     methylPREDNIsolone 4 MG tablet  Commonly known as:  MEDROL DOSPACK  follow package directions     multivitamin capsule  Take 1 capsule by mouth daily.     pantoprazole 40 MG tablet  Commonly known as:  PROTONIX  Take 1 tablet (40 mg total) by mouth daily before breakfast.     pravastatin 40 MG tablet  Commonly known as:  PRAVACHOL  Take 1 tablet (40 mg total) by mouth at bedtime. <please make appointment with Dr. Debara Pickett for refills>     PROAIR HFA 108 (90 BASE) MCG/ACT inhaler  Generic drug:  albuterol  Inhale 2 puffs into the lungs every 6 (six) hours as needed. Shortness of breath     albuterol (2.5 MG/3ML) 0.083% nebulizer solution  Commonly known  as:  PROVENTIL  USE 1 VIAL IN NEBULIZER THREE TIMES DAILY.     tiotropium 18 MCG inhalation capsule  Commonly known as:  SPIRIVA  Place 1 capsule (18 mcg total) into inhaler and inhale daily.     torsemide 20 MG tablet  Commonly known as:  DEMADEX  Take 20 mg by mouth Daily.        Patients skin is clean, dry and intact, no evidence of skin break down. IV site discontinued and catheter remains intact. Site without signs and symptoms of complications. Dressing and pressure applied.  Patient escorted to car by Lovena Le, RN in a wheelchair,  no distress noted upon discharge.  Regino Bellow 10/04/2014 11:30 AM

## 2014-10-04 NOTE — Care Management Utilization Note (Signed)
UR completed 

## 2014-10-04 NOTE — Discharge Summary (Signed)
Physician Discharge Summary  Patient ID: [name] MRN: [id number] DOB/AGE: 05-06-1941 74 y.o. Primary Care Physician:Ion Gonnella L, MD Admit date: 09/30/2014 Discharge date: 10/04/2014    Discharge Diagnoses:   Active Problems:   Respiratory distress   Hypoxia   COPD with acute exacerbation   COPD exacerbation   Dysphagia   Acute on chronic respiratory failure     Medication List    TAKE these medications        ALPRAZolam 0.25 MG tablet  Commonly known as:  XANAX  Take 1 tablet (0.25 mg total) by mouth 3 (three) times daily as needed for sleep or anxiety.     azithromycin 250 MG tablet  Commonly known as:  ZITHROMAX Z-PAK  Take 2 tablets (500 mg) on  Day 1,  followed by 1 tablet (250 mg) once daily on Days 2 through 5.     buPROPion 75 MG tablet  Commonly known as:  WELLBUTRIN  Take 1 tablet (75 mg total) by mouth 2 (two) times daily.     calcium-vitamin D 250-125 MG-UNIT per tablet  Commonly known as:  OSCAL  Take 1 tablet by mouth 2 (two) times daily.     DILTIAZEM HCL CD 360 MG 24 hr capsule  Generic drug:  diltiazem  Take 360 mg by mouth daily.     Fluticasone Furoate-Vilanterol 100-25 MCG/INH Aepb  Commonly known as:  BREO ELLIPTA  Inhale 1 puff into the lungs daily.     levothyroxine 25 MCG tablet  Commonly known as:  SYNTHROID, LEVOTHROID  Take 25 mcg by mouth daily.     loratadine 10 MG tablet  Commonly known as:  CLARITIN  Take 10 mg by mouth daily.     methylPREDNIsolone 4 MG tablet  Commonly known as:  MEDROL DOSPACK  follow package directions     multivitamin capsule  Take 1 capsule by mouth daily.     pantoprazole 40 MG tablet  Commonly known as:  PROTONIX  Take 1 tablet (40 mg total) by mouth daily before breakfast.     pravastatin 40 MG tablet  Commonly known as:  PRAVACHOL  Take 1 tablet (40 mg total) by mouth at bedtime. <please make appointment with Dr. Debara Pickett for refills>     PROAIR HFA 108 (90 BASE) MCG/ACT inhaler   Generic drug:  albuterol  Inhale 2 puffs into the lungs every 6 (six) hours as needed. Shortness of breath     albuterol (2.5 MG/3ML) 0.083% nebulizer solution  Commonly known as:  PROVENTIL  USE 1 VIAL IN NEBULIZER THREE TIMES DAILY.     tiotropium 18 MCG inhalation capsule  Commonly known as:  SPIRIVA  Place 1 capsule (18 mcg total) into inhaler and inhale daily.     torsemide 20 MG tablet  Commonly known as:  DEMADEX  Take 20 mg by mouth Daily.        Discharged Condition: Improved    Consults: GI  Significant Diagnostic Studies: Dg Chest 2 View  09/30/2014   CLINICAL DATA:  Shortness of breath for 2 days. History of COPD and hypertension  EXAM: CHEST  2 VIEW  COMPARISON:  10/13/2013  FINDINGS: Normal heart size. There is calcified atherosclerotic plaque involving the thoracic aorta. The lungs are hyperinflated and there are coarsened interstitial markings compatible with emphysema. No superimposed airspace consolidation.  IMPRESSION: 1. No acute findings. 2. Emphysema.   Electronically Signed   By: Kerby Moors M.D.   On: 09/30/2014 13:33   Dg Esophagus  10/01/2014   CLINICAL DATA:  Dysphagia.  EXAM: ESOPHOGRAM/BARIUM SWALLOW  TECHNIQUE: Single contrast examination was performed using thin barium or water soluble.  FLUOROSCOPY TIME:  1 min 30 seconds.  COMPARISON:  Chest x-ray 09/30/2014.  FINDINGS: Exam is limited due to the patient's clinical condition . Cervical and thoracic esophagus are widely patent. No focal fixed lesions noted. No definite reflux.Barium tablet passed into the stomach.  IMPRESSION: No significant abnormality identified   Electronically Signed   By: Larned   On: 10/01/2014 14:56    Lab Results: Basic Metabolic Panel:  Recent Labs  10/02/14 0819  NA 140  K 3.6  CL 96  CO2 35*  GLUCOSE 154*  BUN 29*  CREATININE 0.94  CALCIUM 9.3   Liver Function Tests: No results for input(s): AST, ALT, ALKPHOS, BILITOT, PROT, ALBUMIN in the last  72 hours.   CBC: No results for input(s): WBC, NEUTROABS, HGB, HCT, MCV, PLT in the last 72 hours.  No results found for this or any previous visit (from the past 240 hour(s)).   Hospital Course: This is a 74 year old who had been in her usual state of poor health at home when she developed increasing problems with shortness of breath. She eventually came to the emergency department where she was noted to have COPD exacerbation hypoxia and she was admitted. She was started on steroids and antibiotics inhale bronchodilators and slowly improved. She had an episode of acute dysphagia and had GI consultation and had a barium pill esophagogram that was normal. Her dysphagia spontaneously resolved. She continued improvement from her COPD and was back at baseline at the time of discharge  Discharge Exam: Blood pressure 140/78, pulse 95, temperature 98.2 F (36.8 C), temperature source Oral, resp. rate 20, height 5' (1.524 m), weight 43.1 kg (95 lb 0.3 oz), SpO2 95 %. She is awake and alert. Her chest is clear. Her heart is regular.  Disposition: Home we discussed home health services and she does not want to participate. She has oxygen and a nebulizer at home already.      Discharge Instructions    Discharge patient    Complete by:  As directed              Signed: Jacobi Nile L   10/04/2014, 8:51 AM

## 2014-10-04 NOTE — Progress Notes (Signed)
Subjective: She says she feels much better today. She has no new complaints. she wants to go home   Objective: Vital signs in last 24 hours: Temp:  [97.9 F (36.6 C)-98.2 F (36.8 C)] 98.2 F (36.8 C) (01/28 0610) Pulse Rate:  [88-109] 95 (01/28 0610) Resp:  [20] 20 (01/28 0610) BP: (140-173)/(66-78) 140/78 mmHg (01/28 0610) SpO2:  [93 %-100 %] 95 % (01/28 0752) Weight:  [43.1 kg (95 lb 0.3 oz)] 43.1 kg (95 lb 0.3 oz) (01/28 0610) Weight change: -1.1 kg (-2 lb 6.8 oz) Last BM Date: 10/02/14  Intake/Output from previous day: 01/27 0701 - 01/28 0700 In: 363 [P.O.:360; I.V.:3] Out: 1550 [Urine:1550]  PHYSICAL EXAM General appearance: alert, cooperative and no distress Resp: clear to auscultation bilaterally Cardio: regular rate and rhythm, S1, S2 normal, no murmur, click, rub or gallop GI: soft, non-tender; bowel sounds normal; no masses,  no organomegaly Extremities: extremities normal, atraumatic, no cyanosis or edema  Lab Results:  No results found for this or any previous visit (from the past 48 hour(s)).  ABGS No results for input(s): PHART, PO2ART, TCO2, HCO3 in the last 72 hours.  Invalid input(s): PCO2 CULTURES No results found for this or any previous visit (from the past 240 hour(s)). Studies/Results: No results found.  Medications:  Prior to Admission:  Prescriptions prior to admission  Medication Sig Dispense Refill Last Dose  . albuterol (PROAIR HFA) 108 (90 BASE) MCG/ACT inhaler Inhale 2 puffs into the lungs every 6 (six) hours as needed. Shortness of breath   09/30/2014 at Unknown time  . albuterol (PROVENTIL) (2.5 MG/3ML) 0.083% nebulizer solution USE 1 VIAL IN NEBULIZER THREE TIMES DAILY. (Patient taking differently: Take 2.5 mg by nebulization 3 (three) times daily. USE 1 VIAL IN NEBULIZER THREE TIMES DAILY.) 360 mL 5 09/30/2014 at Unknown time  . ALPRAZolam (XANAX) 0.25 MG tablet Take 1 tablet (0.25 mg total) by mouth 3 (three) times daily as needed for  sleep or anxiety. 30 tablet 0 09/30/2014 at Unknown time  . buPROPion (WELLBUTRIN) 100 MG tablet Take 100 mg by mouth 2 (two) times daily.   09/30/2014 at Unknown time  . calcium-vitamin D (OSCAL) 250-125 MG-UNIT per tablet Take 1 tablet by mouth 2 (two) times daily.   09/30/2014 at Unknown time  . diltiazem (DILTIAZEM HCL CD) 360 MG 24 hr capsule Take 360 mg by mouth daily.   09/30/2014 at Unknown time  . Fluticasone Furoate-Vilanterol (BREO ELLIPTA) 100-25 MCG/INH AEPB Inhale 1 puff into the lungs daily. 90 each 0 09/30/2014 at Unknown time  . levothyroxine (SYNTHROID, LEVOTHROID) 25 MCG tablet Take 25 mcg by mouth daily.     09/30/2014 at Unknown time  . loratadine (CLARITIN) 10 MG tablet Take 10 mg by mouth daily.   09/30/2014 at Unknown time  . Multiple Vitamin (MULTIVITAMIN) capsule Take 1 capsule by mouth daily.     09/30/2014 at Unknown time  . pantoprazole (PROTONIX) 40 MG tablet Take 1 tablet (40 mg total) by mouth daily before breakfast. 30 tablet 12 09/30/2014 at Unknown time  . pravastatin (PRAVACHOL) 40 MG tablet Take 1 tablet (40 mg total) by mouth at bedtime. <please make appointment with Dr. Debara Pickett for refills> 90 tablet 0 09/29/2014 at Unknown time  . tiotropium (SPIRIVA) 18 MCG inhalation capsule Place 1 capsule (18 mcg total) into inhaler and inhale daily. 90 capsule 3 09/30/2014 at Unknown time  . torsemide (DEMADEX) 20 MG tablet Take 20 mg by mouth Daily.   09/30/2014 at Unknown time  Scheduled: . albuterol  2.5 mg Nebulization QID  . azithromycin  500 mg Oral QHS  . buPROPion  75 mg Oral BID  . diltiazem  360 mg Oral Daily  . enoxaparin (LOVENOX) injection  40 mg Subcutaneous Q24H  . Fluticasone Furoate-Vilanterol  1 puff Inhalation Daily  . levothyroxine  25 mcg Oral QAC breakfast  . methylPREDNISolone (SOLU-MEDROL) injection  60 mg Intravenous Q12H  . pantoprazole  40 mg Oral QAC breakfast  . pravastatin  40 mg Oral QHS  . sodium chloride  3 mL Intravenous Q12H  . tiotropium  18  mcg Inhalation Daily   Continuous:  TRV:[id number] **OR** acetaminophen, albuterol, ALPRAZolam, alum & mag hydroxide-simeth, docusate sodium, guaiFENesin-dextromethorphan, ondansetron **OR** ondansetron (ZOFRAN) IV  Assesment: She was admitted with COPD exacerbation. She had acute on chronic respiratory failure with hypoxia. She had an episode of dysphagia but that has resolved. She is much improved and ready for discharge Active Problems:   Respiratory distress   Hypoxia   COPD with acute exacerbation   COPD exacerbation   Dysphagia    Plan: Discharge home    LOS: 4 days   Tocara Mennen L 10/04/2014, 8:49 AM

## 2014-10-15 ENCOUNTER — Encounter: Payer: Self-pay | Admitting: Critical Care Medicine

## 2014-10-17 ENCOUNTER — Ambulatory Visit: Payer: Medicare Other | Admitting: Critical Care Medicine

## 2014-11-08 ENCOUNTER — Ambulatory Visit: Payer: Medicare Other | Admitting: Gastroenterology

## 2014-11-28 ENCOUNTER — Other Ambulatory Visit: Payer: Self-pay | Admitting: Critical Care Medicine

## 2014-12-05 ENCOUNTER — Ambulatory Visit (INDEPENDENT_AMBULATORY_CARE_PROVIDER_SITE_OTHER): Payer: Medicare Other | Admitting: Critical Care Medicine

## 2014-12-05 ENCOUNTER — Encounter: Payer: Self-pay | Admitting: Critical Care Medicine

## 2014-12-05 VITALS — BP 140/80 | HR 94 | Temp 98.3°F | Ht 60.0 in | Wt 107.0 lb

## 2014-12-05 DIAGNOSIS — J449 Chronic obstructive pulmonary disease, unspecified: Secondary | ICD-10-CM

## 2014-12-05 NOTE — Assessment & Plan Note (Addendum)
Had flare ups x3 in past yr but feels "great" now that she has been on Breo.  -Continue Breo, Symbicort, and the nebulizer treatment.  -She will start Zyrtec to prevent allergic rhinitis -Baking soda tongue brushing to treat yeast/thrush with slow deep breaths when using Breo.  -Provided printed copy of Oxygen tank/concentrator for her use in her upcoming trip.

## 2014-12-05 NOTE — Patient Instructions (Signed)
-  Brush your tongue twice per day with baking soda.  -Continue using the Breo but take slow deep breaths to prevent thrush or yeast infection in your mouth or tongue -Follow up with [place] in 2 months.

## 2014-12-05 NOTE — Progress Notes (Signed)
Subjective:    Patient ID: [name], female    DOB: 29-Nov-1940, 74 y.o.   MRN: [id number]  HPI 74 yo Followed in Pulmonary clinic/  Healthcare/ Joya Gaskins   - Spirometry 07/14/11 FEV1  0.39 (21%) ratio 26  - o2 dependent -O2 supplier: Maudie Mercury  12/05/2014 Chief Complaint  Patient presents with  . Follow-up    Pt states she has SOB with exertion but over all her breathing is much better since Jan. She denies any tightness, wheezing, n/v/d or f/c/s.  Went to the hospital at [facility] in the end of January, stayed 5 days for COPD exacerbation. Not strong enough when got home, could not shower for two weeks, but had helped from her son who leaves with her. Took antibiotics and prednisone. She had a gradual recovery and feels great now.  Feels like Memory Dance has made her better, this was started last year. Using Breo in the mornings.  Spiriva once per day.  Albuterol inh as needed, has not needed this past week.  Albuterol nebulizer 3 times per day.  On O2 3L day and night and with activity. Gets lightheaded occasionally when walking but this improves with 3.5L of O2. Using Helios Plus portable O2, lasts for 5 hours.  She loves to travel, will fly to near [place], [place] to visit her friend and family.    Review of Systems Constitutional:   No  weight loss, night sweats,  Fevers, chills, or  lassitude.  HEENT:   No headaches,  Difficulty swallowing,  Tooth/dental problems, or  Sore throat,                No sneezing, itching, ear ache, nasal congestion, post nasal drip,   CV:  No chest pain,  Orthopnea, PND, swelling in lower extremities, anasarca, dizziness, palpitations, syncope.   GI  No heartburn, indigestion, abdominal pain, nausea, vomiting, diarrhea, change in bowel habits, loss of appetite, bloody stools.   Resp:  .  No chest wall deformity  Skin: no rash or lesions.  GU: no dysuria, change in color of urine, no urgency or frequency.  No flank pain, no hematuria    MS:  No joint pain or swelling.  No decreased range of motion.  No back pain.  Psych:  No change in mood or affect. No depression or anxiety.  No memory loss.     Objective:   Physical ExamBP 140/80 mmHg  Pulse 94  Temp(Src) 98.3 F (36.8 C) (Oral)  Ht 5' (1.524 m)  Wt 107 lb (48.535 kg)  BMI 20.90 kg/m2  SpO2 96%  GEN: A/Ox3; pleasant , NAD, frail   HEENT:  [place]/AT,  EACs-clear, TMs-wnl, NOSE-clear, THROAT-clear, no lesions, no postnasal drip or exudate noted.   NECK:  Supple w/ fair ROM; no JVD; normal carotid impulses w/o bruits; no thyromegaly or nodules palpated; no lymphadenopathy.  RESP  No wheeze, Decreased BS in bases , no accessory muscle use, no dullness to percussion  CARD:  RRR, no m/r/g  , no peripheral edema, pulses intact, no cyanosis or clubbing.  GI:   Soft & nt; nml bowel sounds; no organomegaly or masses detected.  Musco: Warm bil, no deformities or joint swelling noted.   Neuro: alert, no focal deficits noted.    Skin: Warm, no lesions or rashes     Assessment & Plan:   COPD Golds D, frequent exacerbations Had flare ups x3 in past yr but feels "great" now that she has  been on Breo.  -Continue Breo, Symbicort, and the nebulizer treatment.  -She will start Zyrtec to prevent allergic rhinitis -Baking soda tongue brushing to treat yeast/thrush with slow deep breaths when using Breo.  -Provided printed copy of Oxygen tank/concentrator for her use in her upcoming trip.      Updated Medication List Outpatient Encounter Prescriptions as of 12/05/2014  Medication Sig  . albuterol (PROAIR HFA) 108 (90 BASE) MCG/ACT inhaler Inhale 2 puffs into the lungs every 6 (six) hours as needed. Shortness of breath  . albuterol (PROVENTIL) (2.5 MG/3ML) 0.083% nebulizer solution USE 1 VIAL IN NEBULIZER THREE TIMES DAILY.  Marland Kitchen ALPRAZolam (XANAX) 0.25 MG tablet Take 1 tablet (0.25 mg total) by mouth 3 (three) times daily as needed for sleep or anxiety.  Marland Kitchen buPROPion  (WELLBUTRIN) 75 MG tablet Take 1 tablet (75 mg total) by mouth 2 (two) times daily.  . calcium-vitamin D (OSCAL) 250-125 MG-UNIT per tablet Take 1 tablet by mouth 2 (two) times daily.  Marland Kitchen diltiazem (DILTIAZEM HCL CD) 360 MG 24 hr capsule Take 360 mg by mouth daily.  . Fluticasone Furoate-Vilanterol (BREO ELLIPTA) 100-25 MCG/INH AEPB Inhale 1 puff into the lungs daily.  Marland Kitchen levothyroxine (SYNTHROID, LEVOTHROID) 25 MCG tablet Take 25 mcg by mouth daily.    . Multiple Vitamin (MULTIVITAMIN) capsule Take 1 capsule by mouth daily.    . pantoprazole (PROTONIX) 40 MG tablet Take 1 tablet (40 mg total) by mouth daily before breakfast.  . pravastatin (PRAVACHOL) 40 MG tablet Take 1 tablet (40 mg total) by mouth at bedtime. <please make appointment with Dr. Debara Pickett for refills>  . tiotropium (SPIRIVA) 18 MCG inhalation capsule Place 1 capsule (18 mcg total) into inhaler and inhale daily.  Marland Kitchen torsemide (DEMADEX) 20 MG tablet Take 20 mg by mouth Daily.  . [DISCONTINUED] loratadine (CLARITIN) 10 MG tablet Take 10 mg by mouth daily.  . [DISCONTINUED] methylPREDNIsolone (MEDROL DOSPACK) 4 MG tablet follow package directions (Patient not taking: Reported on 12/05/2014)

## 2014-12-06 ENCOUNTER — Telehealth: Payer: Self-pay | Admitting: Critical Care Medicine

## 2014-12-06 ENCOUNTER — Ambulatory Visit: Payer: Medicare Other | Admitting: Gastroenterology

## 2014-12-06 NOTE — Telephone Encounter (Signed)
Dr. Joya Gaskins, Per Dr. Luan Pulling office, pt's last pneumovax was 06/2011.  I added this to pt's immunization hx.

## 2014-12-06 NOTE — Telephone Encounter (Signed)
Noted  

## 2014-12-06 NOTE — Telephone Encounter (Signed)
Forwarding to [employer] as FYI.

## 2014-12-26 DIAGNOSIS — L650 Telogen effluvium: Secondary | ICD-10-CM | POA: Diagnosis not present

## 2014-12-26 DIAGNOSIS — L818 Other specified disorders of pigmentation: Secondary | ICD-10-CM | POA: Diagnosis not present

## 2014-12-27 ENCOUNTER — Other Ambulatory Visit: Payer: Self-pay | Admitting: *Deleted

## 2014-12-27 MED ORDER — PRAVASTATIN SODIUM 40 MG PO TABS: 40.0000 mg | ORAL_TABLET | Freq: Every day | ORAL | Status: DC

## 2014-12-27 NOTE — Telephone Encounter (Signed)
Rx(s) sent to pharmacy electronically.  

## 2014-12-31 DIAGNOSIS — Z961 Presence of intraocular lens: Secondary | ICD-10-CM | POA: Diagnosis not present

## 2014-12-31 DIAGNOSIS — H59092 Other disorders of the left eye following cataract surgery: Secondary | ICD-10-CM | POA: Diagnosis not present

## 2014-12-31 DIAGNOSIS — H524 Presbyopia: Secondary | ICD-10-CM | POA: Diagnosis not present

## 2014-12-31 DIAGNOSIS — H3531 Nonexudative age-related macular degeneration: Secondary | ICD-10-CM | POA: Diagnosis not present

## 2015-01-21 ENCOUNTER — Ambulatory Visit (INDEPENDENT_AMBULATORY_CARE_PROVIDER_SITE_OTHER): Payer: Medicare Other | Admitting: Adult Health

## 2015-01-21 ENCOUNTER — Encounter: Payer: Self-pay | Admitting: Adult Health

## 2015-01-21 VITALS — BP 118/70 | HR 85 | Temp 98.4°F | Ht 60.0 in | Wt 108.0 lb

## 2015-01-21 DIAGNOSIS — J441 Chronic obstructive pulmonary disease with (acute) exacerbation: Secondary | ICD-10-CM

## 2015-01-21 MED ORDER — PREDNISONE 10 MG PO TABS: ORAL_TABLET | ORAL | Status: DC

## 2015-01-21 MED ORDER — AZITHROMYCIN 250 MG PO TABS: ORAL_TABLET | ORAL | Status: AC

## 2015-01-21 NOTE — Assessment & Plan Note (Signed)
Flare ,  Plan   Zpack take as directed.  Prednisone taper over next week.  Mucinex DM Twice daily As needed  Cough/congestion .  Please contact office for sooner follow up if symptoms do not improve or worsen or seek emergency care  follow up Dr. Joya Gaskins  In 6 weeks and As needed

## 2015-01-21 NOTE — Progress Notes (Signed)
   Subjective:    Patient ID: [name], female    DOB: Nov 09, 1940, 74 y.o.   MRN: [id number]  HPI 74 yo female   Followed in Pulmonary clinic/ [facility]/ Joya Gaskins   - Spirometry 07/14/11 FEV1  0.39 (21%) ratio 26  - o2 dependent -O2 supplier: Maudie Mercury  01/21/2015 Acute OV : COPD GOLD IV /O2 dependent Pt presents for an acute office visit.  Complains of prod cough with green mucus, wheezing, tightness x4 days.  No taking otc meds.  Denies any f/c/s, n/v/d, hemoptysis., chest pain, orthopnea or edema.  On O2 3 l/m . Remains on BREO and Spiriva  Last cxr 09/2014 nad  PVX and prenar utd  No recent travel or abx.    Review of Systems Constitutional:   No  weight loss, night sweats,  Fevers, chills, fatigue, or  lassitude.  HEENT:   No headaches,  Difficulty swallowing,  Tooth/dental problems, or  Sore throat,                No sneezing, itching, ear ache,  +nasal congestion, post nasal drip,   CV:  No chest pain,  Orthopnea, PND, swelling in lower extremities, anasarca, dizziness, palpitations, syncope.   GI  No heartburn, indigestion, abdominal pain, nausea, vomiting, diarrhea, change in bowel habits, loss of appetite, bloody stools.   Resp:    No chest wall deformity  Skin: no rash or lesions.  GU: no dysuria, change in color of urine, no urgency or frequency.  No flank pain, no hematuria   MS:  No joint pain or swelling.  No decreased range of motion.  No back pain.  Psych:  No change in mood or affect. No depression or anxiety.  No memory loss.         Objective:   Physical Exam  GEN: A/Ox3; pleasant , NAD .,elderly and frail   HEENT:  [place]/AT,  EACs-clear, TMs-wnl, NOSE-clear, THROAT-clear, no lesions, no postnasal drip or exudate noted.   NECK:  Supple w/ fair ROM; no JVD; normal carotid impulses w/o bruits; no thyromegaly or nodules palpated; no lymphadenopathy.  RESP  Decreased BS in bases , no wheezing .no accessory muscle use, no dullness to  percussion  CARD:  RRR, no m/r/g  , no peripheral edema, pulses intact, no cyanosis or clubbing.  GI:   Soft & nt; nml bowel sounds; no organomegaly or masses detected.  Musco: Warm bil, no deformities or joint swelling noted.   Neuro: alert, no focal deficits noted.    Skin: Warm, no lesions or rashes        Assessment & Plan:

## 2015-01-21 NOTE — Patient Instructions (Signed)
Zpack take as directed.  Prednisone taper over next week.  Mucinex DM Twice daily As needed  Cough/congestion .  Please contact office for sooner follow up if symptoms do not improve or worsen or seek emergency care  follow up Dr. Joya Gaskins  In 6 weeks and As needed

## 2015-01-30 ENCOUNTER — Ambulatory Visit: Payer: Medicare Other | Admitting: Critical Care Medicine

## 2015-02-26 ENCOUNTER — Encounter: Payer: Self-pay | Admitting: Critical Care Medicine

## 2015-03-04 ENCOUNTER — Ambulatory Visit: Payer: Medicare Other | Admitting: Critical Care Medicine

## 2015-03-18 ENCOUNTER — Other Ambulatory Visit: Payer: Self-pay | Admitting: Critical Care Medicine

## 2015-03-19 ENCOUNTER — Other Ambulatory Visit: Payer: Self-pay | Admitting: Critical Care Medicine

## 2015-03-19 MED ORDER — FLUTICASONE FUROATE-VILANTEROL 100-25 MCG/INH IN AEPB: 1.0000 | INHALATION_SPRAY | Freq: Every day | RESPIRATORY_TRACT | Status: DC

## 2015-03-19 NOTE — Telephone Encounter (Signed)
Called and spoke to patient, advised patient that RX was sent to [facility]. Scheduled appointment for patient and advised her that she needs to keep appointment. Nothing further needed.

## 2015-04-22 DIAGNOSIS — I10 Essential (primary) hypertension: Secondary | ICD-10-CM | POA: Diagnosis not present

## 2015-04-22 DIAGNOSIS — E039 Hypothyroidism, unspecified: Secondary | ICD-10-CM | POA: Diagnosis not present

## 2015-04-22 DIAGNOSIS — F419 Anxiety disorder, unspecified: Secondary | ICD-10-CM | POA: Diagnosis not present

## 2015-04-22 DIAGNOSIS — J449 Chronic obstructive pulmonary disease, unspecified: Secondary | ICD-10-CM | POA: Diagnosis not present

## 2015-04-24 ENCOUNTER — Ambulatory Visit: Payer: Medicare Other | Admitting: Critical Care Medicine

## 2015-04-25 ENCOUNTER — Other Ambulatory Visit: Payer: Self-pay

## 2015-04-25 MED ORDER — PRAVASTATIN SODIUM 40 MG PO TABS: 40.0000 mg | ORAL_TABLET | Freq: Every day | ORAL | Status: DC

## 2015-04-25 NOTE — Telephone Encounter (Signed)
Rx(s) sent to pharmacy electronically.  

## 2015-05-02 ENCOUNTER — Other Ambulatory Visit: Payer: Self-pay | Admitting: Critical Care Medicine

## 2015-05-10 ENCOUNTER — Other Ambulatory Visit: Payer: Self-pay | Admitting: Critical Care Medicine

## 2015-05-27 ENCOUNTER — Other Ambulatory Visit (HOSPITAL_COMMUNITY): Payer: Self-pay | Admitting: Pulmonary Disease

## 2015-05-27 DIAGNOSIS — Z23 Encounter for immunization: Secondary | ICD-10-CM | POA: Diagnosis not present

## 2015-05-27 DIAGNOSIS — Z961 Presence of intraocular lens: Secondary | ICD-10-CM | POA: Diagnosis not present

## 2015-05-27 DIAGNOSIS — Z1231 Encounter for screening mammogram for malignant neoplasm of breast: Secondary | ICD-10-CM

## 2015-05-27 DIAGNOSIS — H26492 Other secondary cataract, left eye: Secondary | ICD-10-CM | POA: Diagnosis not present

## 2015-05-31 ENCOUNTER — Ambulatory Visit (HOSPITAL_COMMUNITY): Payer: Medicare Other

## 2015-06-06 ENCOUNTER — Ambulatory Visit (INDEPENDENT_AMBULATORY_CARE_PROVIDER_SITE_OTHER): Payer: Medicare Other | Admitting: Nurse Practitioner

## 2015-06-06 ENCOUNTER — Encounter: Payer: Self-pay | Admitting: Nurse Practitioner

## 2015-06-06 VITALS — BP 138/75 | HR 86 | Temp 96.6°F | Ht 60.0 in | Wt 109.8 lb

## 2015-06-06 DIAGNOSIS — R1314 Dysphagia, pharyngoesophageal phase: Secondary | ICD-10-CM | POA: Diagnosis not present

## 2015-06-06 DIAGNOSIS — R1319 Other dysphagia: Secondary | ICD-10-CM

## 2015-06-06 DIAGNOSIS — R197 Diarrhea, unspecified: Secondary | ICD-10-CM | POA: Diagnosis not present

## 2015-06-06 DIAGNOSIS — R1310 Dysphagia, unspecified: Secondary | ICD-10-CM

## 2015-06-06 DIAGNOSIS — R109 Unspecified abdominal pain: Secondary | ICD-10-CM | POA: Insufficient documentation

## 2015-06-06 DIAGNOSIS — R1030 Lower abdominal pain, unspecified: Secondary | ICD-10-CM | POA: Diagnosis not present

## 2015-06-06 NOTE — Patient Instructions (Addendum)
1.  When you collect her stool sample, take it to the lab 2.  He can have your boodwork drawn when you bring your stool samples to the lab 3. Take a probiotic once a day for a month 4. Return for follow-up in a month.

## 2015-06-06 NOTE — Assessment & Plan Note (Signed)
Patient with diarrhea and superimposed abdominal cramping pain. Her pain was in her lower abdomen. Pain seems to "let up" after having a bowel movement although it was persistent. Her abdominal pain has resolved since her diarrhea has begun resolving. Likely viral gastroenteritis with post infectious diarrhea. Labs and stool studies as noted above. Possible overlay of irritable bowel as well. Return for follow-up in 4 weeks.

## 2015-06-06 NOTE — Progress Notes (Signed)
CC'D TO PCP °

## 2015-06-06 NOTE — Assessment & Plan Note (Signed)
Patient with diarrhea which started back about 1 month ago with up to 6 stools a day which are described as loose watery and nonbloody. Her diarrhea has with postinfectious diarrhea. However, to be safe we will order stool studies to check for C. difficile and other bacterial infections. We will have her start on a probiotic once a day for the next month and return in 4 weeks for follow-up. We'll also check CBC, CMP, and lipase.

## 2015-06-06 NOTE — Assessment & Plan Note (Signed)
Dysphagia symptoms very well controlled since dilation in 2015. Continue to monitor.

## 2015-06-06 NOTE — Progress Notes (Signed)
Referring Provider: Sinda Du, MD Primary Care Physician:  Alonza Bogus, MD Primary GI:  Dr. Oneida Alar  Chief Complaint  Patient presents with  . Diarrhea    HPI:   74 year old female presents for evaluation of diarrhea. Last office visit 01/24/2014 where she had a noted weight loss of at least 5 pounds due to not liking to eat, poor appetite, likely multifactorial cause. At that time she was having constipation and was not interested in pursuing colonoscopy symptoms were improving with activity and MiraLAX. Also at that time her esophageal dysphagia symptoms had improved status post dilation. Recommended two-month follow-up which was canceled.  Today she states her diarrhea started about 1 month ago and was gradual onset along with bloating. Her bloating is resolved. Currently has a bowel movement about once a day. Stools are still loose, [place] 6. At the peak of her symptoms she was having 5-6 stools a day which were watery. Denies hematochezia and melena. Was also having lower abdominal cramping which is nearly resolved, just some residual tenderness. Her abdominal pain did "let up some" after her bowel movements. She has been under some stress with the passing of her friend. Did have subjective fever/chills one day when having a particularly bad day with her symptoms, none since. No changes in diet, no known sick contacts. Is drinking plenty of water, appetite is chronically poor. Dysphagia symptoms remain very well controlled with diet and post dilation in 2015. Denies chest pain, dyspnea, dizziness, lightheadedness, syncope, near syncope. Denies any other upper or lower GI symptoms.  Past Medical History  Diagnosis Date  . COPD (chronic obstructive pulmonary disease)     home O2  . Hypertension   . Hyperlipidemia   . History of tobacco abuse   . Mild depression   . Hypothyroidism   . Diastolic dysfunction   . History of nuclear stress test 09/23/2009    dipyridamole;  normal pattern of perfusion; low risk scan   . CHF (congestive heart failure)     Past Surgical History  Procedure Laterality Date  . Gallbladder surgery  1977  . Total abdominal hysterectomy  1980's  . Cataract extraction w/phaco  09/08/2012    Procedure: CATARACT EXTRACTION PHACO AND INTRAOCULAR LENS PLACEMENT (IOC);  Surgeon: Tonny Branch, MD;  Location: [facility];  Service: Ophthalmology;  Laterality: Right;  CDE:19.67  . Tonsillectomy    . Right carpal tunnel release    . Cataract extraction w/phaco  09/19/2012    Procedure: CATARACT EXTRACTION PHACO AND INTRAOCULAR LENS PLACEMENT (IOC);  Surgeon: Tonny Branch, MD;  Location: [facility];  Service: Ophthalmology;  Laterality: Left;  CDE:19.83  . Transthoracic echocardiogram  09/23/2009    EF>55%; trace TR; normal LA & RA size; normal LV & RV size & systolic function   . Colonoscopy  2007    Dr. Gala Romney: internal hemorrhoids, benign polyps  . Esophagogastroduodenoscopy  2007    Dr. Gala Romney: normal esophagus, non-critical Schatzki's ring not manipulated, normal small bowel biopsy  . Esophagogastroduodenoscopy (egd) with esophageal dilation N/A 10/16/2013    Dr. Fields:Schatzki's ring at the gastroesophageal junction s/p dilation/MODERATE Erosive gastritis. reactive gastropathy.     Current Outpatient Prescriptions  Medication Sig Dispense Refill  . albuterol (PROAIR HFA) 108 (90 BASE) MCG/ACT inhaler Inhale 2 puffs into the lungs every 6 (six) hours as needed. Shortness of breath    . albuterol (PROVENTIL) (2.5 MG/3ML) 0.083% nebulizer solution USE 1 VIAL IN NEBULIZER THREE TIMES DAILY. 360 mL 3  . albuterol (  PROVENTIL) (2.5 MG/3ML) 0.083% nebulizer solution USE 1 VIAL IN NEBULIZER THREE TIMES DAILY. 360 mL 2  . ALPRAZolam (XANAX) 0.25 MG tablet Take 1 tablet (0.25 mg total) by mouth 3 (three) times daily as needed for sleep or anxiety. 30 tablet 0  . buPROPion (WELLBUTRIN) 75 MG tablet Take 1 tablet (75 mg total) by mouth 2 (two) times daily. 60 tablet 5   . calcium-vitamin D (OSCAL) 250-125 MG-UNIT per tablet Take 1 tablet by mouth 2 (two) times daily.    Marland Kitchen diltiazem (CARDIZEM) 120 MG tablet Take 1 tablet by mouth daily.  2  . Fluticasone Furoate-Vilanterol (BREO ELLIPTA) 100-25 MCG/INH AEPB Inhale 1 puff into the lungs daily. 3 each 0  . levothyroxine (SYNTHROID, LEVOTHROID) 25 MCG tablet Take 25 mcg by mouth daily.      . Multiple Vitamin (MULTIVITAMIN) capsule Take 1 capsule by mouth daily.      . pantoprazole (PROTONIX) 40 MG tablet Take 1 tablet (40 mg total) by mouth daily before breakfast. 30 tablet 12  . pravastatin (PRAVACHOL) 40 MG tablet Take 1 tablet (40 mg total) by mouth at bedtime. NEEDS APPOINTMENT FOR FUTURE REFILLS 30 tablet 0  . torsemide (DEMADEX) 20 MG tablet Take 20 mg by mouth Daily.    . predniSONE (DELTASONE) 10 MG tablet 4 tabs for 2 days, then 3 tabs for 2 days, 2 tabs for 2 days, then 1 tab for 2 days, then stop (Patient not taking: Reported on 06/06/2015) 20 tablet 0  . tiotropium (SPIRIVA) 18 MCG inhalation capsule Place 1 capsule (18 mcg total) into inhaler and inhale daily. 90 capsule 3   No current facility-administered medications for this visit.   Facility-Administered Medications Ordered in Other Visits  Medication Dose Route Frequency Provider Last Rate Last Dose  . neomycin-polymyxin-dexameth (MAXITROL) 0.1 % ophth ointment    PRN Tonny Branch, MD   1 application at 17/61/60 1227    Allergies as of 06/06/2015  . (No Known Allergies)    Family History  Problem Relation Age of Onset  . Allergies Father   . Heart disease Mother   . Rheum arthritis Mother   . Breast cancer Daughter   . Breast cancer Sister   . Cancer Maternal Aunt   . Hypertension Mother   . Stroke Maternal Grandmother   . Stroke Sister   . Colon cancer Neg Hx     Social History   Social History  . Marital Status: Married    Spouse Name: N/A  . Number of Children: 2  . Years of Education: N/A   Occupational History  .  retired     Theme park manager   Social History Main Topics  . Smoking status: Former Smoker -- 1.00 packs/day for 50 years    Types: Cigarettes    Quit date: 09/07/2008  . Smokeless tobacco: Never Used  . Alcohol Use: No  . Drug Use: No  . Sexual Activity: Not Asked   Other Topics Concern  . None   Social History Narrative    Review of Systems: 10-point ROS negative except as per HPI   Physical Exam: BP 138/75 mmHg  Pulse 86  Temp(Src) 96.6 F (35.9 C)  Ht 5' (1.524 m)  Wt 109 lb 12.8 oz (49.805 kg)  BMI 21.44 kg/m2 General:   Alert and oriented. Pleasant and cooperative. Well-nourished and well-developed.  Head:  Normocephalic and atraumatic. Eyes:  Without icterus, sclera clear and conjunctiva pink.  Ears:  Normal auditory acuity. Cardiovascular:  S1,  S2 present without murmurs appreciated. Normal pulses noted. Extremities without clubbing or edema. Respiratory:  Clear to auscultation bilaterally. No wheezes, rales, or rhonchi. No distress. Wearing oxygen per [employer]. Gastrointestinal:  +BS, soft, non-tender and non-distended. No HSM noted. No guarding or rebound. No masses appreciated.  Rectal:  Deferred  Neurologic:  Alert and oriented x4;  grossly normal neurologically. Psych:  Alert and cooperative. Normal mood and affect.    06/06/2015 9:33 AM

## 2015-06-14 ENCOUNTER — Telehealth: Payer: Self-pay

## 2015-06-14 NOTE — Telephone Encounter (Signed)
Noted  

## 2015-06-14 NOTE — Telephone Encounter (Signed)
Pt called and said when she was seen on 06/06/2015, some blood work and stool studies. She said she did not have any more diarrhea at all, and actually she went a few days without a BM. Now she is normal. She will go do the blood work and wait til she has more Diarrhea for the stool studies.

## 2015-06-17 DIAGNOSIS — R197 Diarrhea, unspecified: Secondary | ICD-10-CM | POA: Diagnosis not present

## 2015-06-17 DIAGNOSIS — R109 Unspecified abdominal pain: Secondary | ICD-10-CM | POA: Diagnosis not present

## 2015-06-17 DIAGNOSIS — R1030 Lower abdominal pain, unspecified: Secondary | ICD-10-CM | POA: Diagnosis not present

## 2015-06-18 LAB — COMPREHENSIVE METABOLIC PANEL
ALBUMIN: 4.3 g/dL (ref 3.6–5.1)
ALT: 18 U/L (ref 6–29)
AST: 19 U/L (ref 10–35)
Alkaline Phosphatase: 91 U/L (ref 33–130)
BUN: 15 mg/dL (ref 7–25)
CALCIUM: 8.8 mg/dL (ref 8.6–10.4)
CHLORIDE: 96 mmol/L — AB (ref 98–110)
CO2: 35 mmol/L — AB (ref 20–31)
Creat: 0.74 mg/dL (ref 0.60–0.93)
Glucose, Bld: 71 mg/dL (ref 65–99)
POTASSIUM: 3.3 mmol/L — AB (ref 3.5–5.3)
SODIUM: 141 mmol/L (ref 135–146)
Total Bilirubin: 0.5 mg/dL (ref 0.2–1.2)
Total Protein: 6.4 g/dL (ref 6.1–8.1)

## 2015-06-18 LAB — CBC WITH DIFFERENTIAL/PLATELET
BASOS PCT: 1 % (ref 0–1)
Basophils Absolute: 0.1 10*3/uL (ref 0.0–0.1)
Eosinophils Absolute: 0.4 10*3/uL (ref 0.0–0.7)
Eosinophils Relative: 5 % (ref 0–5)
HCT: 35.9 % — ABNORMAL LOW (ref 36.0–46.0)
HEMOGLOBIN: 12.2 g/dL (ref 12.0–15.0)
Lymphocytes Relative: 31 % (ref 12–46)
Lymphs Abs: 2.6 10*3/uL (ref 0.7–4.0)
MCH: 27.9 pg (ref 26.0–34.0)
MCHC: 34 g/dL (ref 30.0–36.0)
MCV: 82.2 fL (ref 78.0–100.0)
MONO ABS: 0.9 10*3/uL (ref 0.1–1.0)
MONOS PCT: 11 % (ref 3–12)
MPV: 10.4 fL (ref 8.6–12.4)
NEUTROS ABS: 4.3 10*3/uL (ref 1.7–7.7)
Neutrophils Relative %: 52 % (ref 43–77)
Platelets: 391 10*3/uL (ref 150–400)
RBC: 4.37 MIL/uL (ref 3.87–5.11)
RDW: 13.2 % (ref 11.5–15.5)
WBC: 8.3 10*3/uL (ref 4.0–10.5)

## 2015-06-18 LAB — LIPASE: Lipase: 25 U/L (ref 7–60)

## 2015-07-05 ENCOUNTER — Ambulatory Visit: Payer: Medicare Other | Admitting: Nurse Practitioner

## 2015-07-08 ENCOUNTER — Ambulatory Visit (INDEPENDENT_AMBULATORY_CARE_PROVIDER_SITE_OTHER): Payer: Medicare Other | Admitting: Pulmonary Disease

## 2015-07-08 ENCOUNTER — Encounter: Payer: Self-pay | Admitting: Pulmonary Disease

## 2015-07-08 ENCOUNTER — Other Ambulatory Visit: Payer: Self-pay | Admitting: Pulmonary Disease

## 2015-07-08 VITALS — BP 122/68 | HR 80

## 2015-07-08 DIAGNOSIS — Z122 Encounter for screening for malignant neoplasm of respiratory organs: Secondary | ICD-10-CM | POA: Diagnosis not present

## 2015-07-08 DIAGNOSIS — Z87891 Personal history of nicotine dependence: Secondary | ICD-10-CM

## 2015-07-08 DIAGNOSIS — F17200 Nicotine dependence, unspecified, uncomplicated: Secondary | ICD-10-CM

## 2015-07-08 NOTE — Patient Instructions (Signed)
Continue your inhalers as prescribed. Continue oxygen supplementation. We will schedule you for a screening CT of the chest.  Return to clinic in 6 months.

## 2015-07-08 NOTE — Progress Notes (Signed)
Subjective:    Patient ID: [name], female    DOB: 02-Sep-1941, 74 y.o.   MRN: [id number]  HPI Follow for COPD.  Mrs. Haggard is a 74 year old with history of Gold stage IV COPD. She is a heavy ex-smoker with 55-pack-year history of smoking. Quit 8 years ago. She had a history of frequent exacerbations but her symptoms have stabilized after being started [employer]. She continues with that inhaler and also Spiriva. She has albuterol when necessary but does not need to take it on a regular basis. She does not have any recent exacerbations or ER visits. She successfully completed pulmonary rehabilitation 5 years ago.  She is on home oxygen at 3 L/m 24/7.  DATA: PFTs (07/24/11) FVC 1.51 (63%) FEV1 0.39 (21%) F/F 26  CXR (09/30/14) Emphysema. No acute cardiopulmonary abnormalities.   Past Medical History  Diagnosis Date  . COPD (chronic obstructive pulmonary disease)     home O2  . Hypertension   . Hyperlipidemia   . History of tobacco abuse   . Mild depression   . Hypothyroidism   . Diastolic dysfunction   . History of nuclear stress test 09/23/2009    dipyridamole; normal pattern of perfusion; low risk scan   . CHF (congestive heart failure)     Current outpatient prescriptions:  .  albuterol (PROAIR HFA) 108 (90 BASE) MCG/ACT inhaler, Inhale 2 puffs into the lungs every 6 (six) hours as needed. Shortness of breath, Disp: , Rfl:  .  albuterol (PROVENTIL) (2.5 MG/3ML) 0.083% nebulizer solution, USE 1 VIAL IN NEBULIZER THREE TIMES DAILY., Disp: 360 mL, Rfl: 2 .  ALPRAZolam (XANAX) 0.25 MG tablet, Take 1 tablet (0.25 mg total) by mouth 3 (three) times daily as needed for sleep or anxiety., Disp: 30 tablet, Rfl: 0 .  buPROPion (WELLBUTRIN) 75 MG tablet, Take 1 tablet (75 mg total) by mouth 2 (two) times daily., Disp: 60 tablet, Rfl: 5 .  calcium-vitamin D (OSCAL) 250-125 MG-UNIT per tablet, Take 1 tablet by mouth 2 (two) times daily., Disp: , Rfl:  .  diltiazem (CARDIZEM) 120  MG tablet, Take 1 tablet by mouth daily., Disp: , Rfl: 2 .  Fluticasone Furoate-Vilanterol (BREO ELLIPTA) 100-25 MCG/INH AEPB, Inhale 1 puff into the lungs daily., Disp: 3 each, Rfl: 0 .  levothyroxine (SYNTHROID, LEVOTHROID) 25 MCG tablet, Take 25 mcg by mouth daily.  , Disp: , Rfl:  .  Multiple Vitamin (MULTIVITAMIN) capsule, Take 1 capsule by mouth daily.  , Disp: , Rfl:  .  pantoprazole (PROTONIX) 40 MG tablet, Take 1 tablet (40 mg total) by mouth daily before breakfast., Disp: 30 tablet, Rfl: 12 .  pravastatin (PRAVACHOL) 40 MG tablet, Take 1 tablet (40 mg total) by mouth at bedtime. NEEDS APPOINTMENT FOR FUTURE REFILLS, Disp: 30 tablet, Rfl: 0 .  predniSONE (DELTASONE) 10 MG tablet, 4 tabs for 2 days, then 3 tabs for 2 days, 2 tabs for 2 days, then 1 tab for 2 days, then stop, Disp: 20 tablet, Rfl: 0 .  torsemide (DEMADEX) 20 MG tablet, Take 20 mg by mouth Daily., Disp: , Rfl:  .  tiotropium (SPIRIVA) 18 MCG inhalation capsule, Place 1 capsule (18 mcg total) into inhaler and inhale daily., Disp: 90 capsule, Rfl: 3 No current facility-administered medications for this visit.  Facility-Administered Medications Ordered in Other Visits:  .  neomycin-polymyxin-dexameth (MAXITROL) 0.1 % ophth ointment, , , PRN, Tonny Branch, MD, 1 application at 42/35/36 1227  Review of Systems Dyspnea on exertion at  baseline.  Denies any cough, fevers, chills, sputum production, hemoptysis. Denies any chest pain, palpitations. Denies any nausea, vomiting, diarrhea, constipation. All other review of systems are negative.     Objective:   Physical Exam Blood pressure 122/68, pulse 80, SpO2 95 %.  Gen.: Pleasant elderly female. No apparent distress  Neuro: No gross focal deficits. Neck: No JVD, lymphadenopathy, thyromegaly. RS: Bibasilar crackles. CVS: S1-S2 heard, no murmurs rubs gallops. Abdomen: Soft, positive bowel  sounds. Extremities: No edema.    Assessment & Plan:  #1 Gold stage IV COPD. Stable  on current inhalers with no recent exacerbations or need to go on prednisone. She continues on the oxygen supplementation. She had her last pulmonary rehabilitation session about 5 years ago. She continues to maintain an active lifestyle.  #2 Tobacco use. She meets the criteria for a screening CT of the chest. She has 50-pack-year smoking history and quit 8 years ago.  Plan: - Continue Breo Ellipta, Spiriva and supplemental O2/ - Referral for chest screening program.  Marshell Garfinkel MD [facility] Pager [phone] If no answer or after 3pm call: [phone] 07/08/2015, 3:29 PM

## 2015-07-12 ENCOUNTER — Telehealth: Payer: Self-pay | Admitting: Pulmonary Disease

## 2015-07-12 DIAGNOSIS — F17200 Nicotine dependence, unspecified, uncomplicated: Secondary | ICD-10-CM

## 2015-07-12 NOTE — Telephone Encounter (Signed)
Called and spoke with Bethena Roys from [facility] She stated that PM needs to e-sign order for pt to have low dose Ct Chest on 07/16/15 She stated that if PM is not able to e-sign a paper order needs to be faxed to [phone].  Will send to PM nurse to address.

## 2015-07-15 ENCOUNTER — Ambulatory Visit (HOSPITAL_COMMUNITY)
Admission: RE | Admit: 2015-07-15 | Discharge: 2015-07-15 | Disposition: A | Discharge status: Home / Self Care | Payer: Medicare Other | Source: Ambulatory Visit | Attending: Pulmonary Disease | Admitting: Pulmonary Disease

## 2015-07-15 DIAGNOSIS — Z1231 Encounter for screening mammogram for malignant neoplasm of breast: Secondary | ICD-10-CM | POA: Diagnosis not present

## 2015-07-15 NOTE — Telephone Encounter (Signed)
I have printed and faxed order over to [place] @ [facility].  I called made Bethena Roys aware. If she does not receive this then she will call back. Will sign off message

## 2015-07-15 NOTE — Telephone Encounter (Signed)
Spoke with Bethena Roys and is aware we will get in touch with Dr. Vaughan Browner to [facility] order.

## 2015-07-15 NOTE — Telephone Encounter (Signed)
[name] from [place] is calling back

## 2015-07-15 NOTE — Telephone Encounter (Signed)
Dr. Vaughan Browner, have you signed off on this order yet?  Please advise.

## 2015-07-16 ENCOUNTER — Ambulatory Visit (HOSPITAL_COMMUNITY)
Admission: RE | Admit: 2015-07-16 | Discharge: 2015-07-16 | Disposition: A | Discharge status: Home / Self Care | Payer: Medicare Other | Source: Ambulatory Visit | Attending: Pulmonary Disease | Admitting: Pulmonary Disease

## 2015-07-17 ENCOUNTER — Ambulatory Visit (INDEPENDENT_AMBULATORY_CARE_PROVIDER_SITE_OTHER)
Admission: RE | Admit: 2015-07-17 | Discharge: 2015-07-17 | Disposition: A | Discharge status: Home / Self Care | Payer: Medicare Other | Source: Ambulatory Visit | Attending: Pulmonary Disease | Admitting: Pulmonary Disease

## 2015-07-17 ENCOUNTER — Ambulatory Visit (INDEPENDENT_AMBULATORY_CARE_PROVIDER_SITE_OTHER): Payer: Medicare Other | Admitting: Acute Care

## 2015-07-17 ENCOUNTER — Encounter: Payer: Self-pay | Admitting: Acute Care

## 2015-07-17 DIAGNOSIS — Z122 Encounter for screening for malignant neoplasm of respiratory organs: Secondary | ICD-10-CM

## 2015-07-17 DIAGNOSIS — F17210 Nicotine dependence, cigarettes, uncomplicated: Secondary | ICD-10-CM | POA: Diagnosis not present

## 2015-07-17 DIAGNOSIS — Z87891 Personal history of nicotine dependence: Secondary | ICD-10-CM

## 2015-07-17 DIAGNOSIS — F17200 Nicotine dependence, unspecified, uncomplicated: Secondary | ICD-10-CM

## 2015-07-17 NOTE — Progress Notes (Signed)
Shared Decision Making Visit Lung Cancer Screening Program [phone])   Eligibility:  Age 74 y.o.  Pack Years Smoking History Calculation: 55 pack year history (# packs/per year x # years smoked)  Recent History of coughing up blood  no  Unexplained weight loss? no ( >Than 15 pounds within the last 6 months )  Prior History Lung / other cancer no (Diagnosis within the last 5 years already requiring surveillance chest CT Scans).  Smoking Status Former Smoker  Former Smokers: Years since quit: 6 years  Quit Date: 09/07/2008  Visit Components:  Discussion included one or more decision making aids. yes  Discussion included risk/benefits of screening. yes  Discussion included potential follow up diagnostic testing for abnormal scans. yes  Discussion included meaning and risk of over diagnosis. yes  Discussion included meaning and risk of False Positives. yes  Discussion included meaning of total radiation exposure. yes  Counseling Included:  Importance of adherence to annual lung cancer LDCT screening. yes  Impact of comorbidities on ability to participate in the program. yes  Ability and willingness to under diagnostic treatment. yes  Smoking Cessation Counseling:  Current Smokers:   Discussed importance of smoking cessation. No; former smoker  Information about tobacco cessation classes and interventions provided to patient. NA; former smoker  Patient provided with "ticket" for LDCT Scan. yes  Symptomatic Patient. no  Counseling  Diagnosis Code: Tobacco Use Z72.0  Asymptomatic Patient yes  Counseling : asymptomatic non-smoker  Former Smokers:   Discussed the importance of maintaining cigarette abstinence. yes  Diagnosis Code: Personal History of Nicotine Dependence. Z76.734  Information about tobacco cessation classes and interventions provided to patient. Yes  Patient provided with "ticket" for LDCT Scan. yes  Written Order for Lung Cancer Screening  with LDCT placed in Epic. Yes (CT Chest Lung Cancer Screening Low Dose W/O CM) [id number] Z12.2-Screening of respiratory organs Z87.891-Personal history of nicotine dependence  I spent 15 minutes of face to face time discussing the risks and benefits of lung cancer screening with Ms. Kisling. We viewed a power point, highlighting the above noted topics,pausing at intervals to allow for questions to be asked and answered to ensure understanding. We discussed that by quitting smoking she has already taken the single most powerful action she can to decrease her risk of developing lung cancer. She quit using Wellbutrin, and was able to quit cold [place] with the medication.I told her it is important to remain smoke free. I have told her to call me if she considers smoking again so that I can help her to remain smoke free through the use of medication and support. She said she would indeed call me if she needs help. She does have my contact number in the event she needs to call me. We discussed the time and location of her scan, and that I will call her with the results of the scan within 24-48 hours of getting them. She verbalized understanding of all of the above and had no further questions upon leaving the office.I gave her a copy of the power point we viewed together to refer to and encouraged her to call me if she has any questions.She has my card and contact information.   Magdalen Spatz, NP

## 2015-07-18 ENCOUNTER — Ambulatory Visit (INDEPENDENT_AMBULATORY_CARE_PROVIDER_SITE_OTHER): Payer: Medicare Other | Admitting: Nurse Practitioner

## 2015-07-18 ENCOUNTER — Telehealth: Payer: Self-pay | Admitting: Acute Care

## 2015-07-18 ENCOUNTER — Encounter: Payer: Self-pay | Admitting: Nurse Practitioner

## 2015-07-18 ENCOUNTER — Encounter: Payer: Self-pay | Admitting: Pulmonary Disease

## 2015-07-18 VITALS — BP 127/66 | HR 84 | Temp 97.4°F | Ht 60.0 in | Wt 114.4 lb

## 2015-07-18 DIAGNOSIS — R197 Diarrhea, unspecified: Secondary | ICD-10-CM | POA: Diagnosis not present

## 2015-07-18 DIAGNOSIS — R1030 Lower abdominal pain, unspecified: Secondary | ICD-10-CM | POA: Diagnosis not present

## 2015-07-18 MED ORDER — PREDNISONE 10 MG PO TABS: ORAL_TABLET | ORAL | Status: DC

## 2015-07-18 MED ORDER — AZITHROMYCIN 250 MG PO TABS: ORAL_TABLET | ORAL | Status: DC

## 2015-07-18 NOTE — Telephone Encounter (Signed)
I have called [name] with the results of her screening CT. I explained that the scan resulted in a Lung Rads 1 reading, which is a negative scan, no nodules or definitely benign nodules. I explained that the recommendation is for a repeat scan in 12 months, which we will call and schedule about 3 weeks before it is due in Nov. 2017. She has verbalized understanding of the results and had no further questions upon ending the call. She has my contact information in the event she has questions in the future.

## 2015-07-18 NOTE — Patient Instructions (Signed)
1. Try taking a probiotic once a day. The pharmacist a pharmacy can help you take one. 2. Return for follow-up as needed for any new or worsening symptoms.

## 2015-07-18 NOTE — Telephone Encounter (Signed)
Pt email sent in stating below: I saw you on 10/31 was doing fine but I have started having an exacerbation always have one in fall and spring. Thought I was going to be lucky this time. Could send prescription for Zpack and prednisone to [facility] in [place]. They will deliver. I was in [place] yesterday having C Scan maybe all that cold wind stared this. Would you let me know if you can do this? I don't want to wait too long to get started on meds --  Pt also responded that she is having a sore throat and prod cough (light green phlem).  Pt requesting ZPAK and prednisone.  Please advise Dr. Vaughan Browner thanks

## 2015-07-18 NOTE — Progress Notes (Signed)
Referring Provider: Sinda Du, MD Primary Care Physician:  Alonza Bogus, MD Primary GI:  Dr. Oneida Alar  Chief Complaint  Patient presents with  . Follow-up    HPI:   74 year old female presents for follow-up on diarrhea and lower abdominal pain. Last office visit 06/06/2015. That time she described loose watery nonbloody stools up to 6 a day deemed likely viral gastroenteritis with postinfectious diarrhea. Labs and stool studies were ordered. She started taking a probiotic at that time. Proximally 7 days later patient called back saying that she had not had any diarrhea since her office visit and was going to hold off on the stool studies that she could not given appropriate sample. However she would have her labs done. Her labs are normal, potassium stable, negative lipase.  Today she states she's doing great. Her GI symptoms are resolved. Has a bowel movement once a day which is consistent with [place] 4, no straining. Denies abdomianl pain, N/V, hematochezia and melena. Occasional bloating. Denies chest pain, worsening dyspnea, dizziness, lightheadedness, syncope, near syncope. Denies any other upper or lower GI symptoms.   Past Medical History  Diagnosis Date  . COPD (chronic obstructive pulmonary disease) ([place])     home O2  . Hypertension   . Hyperlipidemia   . History of tobacco abuse   . Mild depression   . Hypothyroidism   . Diastolic dysfunction   . History of nuclear stress test 09/23/2009    dipyridamole; normal pattern of perfusion; low risk scan   . CHF (congestive heart failure) [facility])     Past Surgical History  Procedure Laterality Date  . Gallbladder surgery  1977  . Total abdominal hysterectomy  1980's  . Cataract extraction w/phaco  09/08/2012    Procedure: CATARACT EXTRACTION PHACO AND INTRAOCULAR LENS PLACEMENT (IOC);  Surgeon: Tonny Branch, MD;  Location: [facility];  Service: Ophthalmology;  Laterality: Right;  CDE:19.67  . Tonsillectomy    . Right carpal  tunnel release    . Cataract extraction w/phaco  09/19/2012    Procedure: CATARACT EXTRACTION PHACO AND INTRAOCULAR LENS PLACEMENT (IOC);  Surgeon: Tonny Branch, MD;  Location: [facility];  Service: Ophthalmology;  Laterality: Left;  CDE:19.83  . Transthoracic echocardiogram  09/23/2009    EF>55%; trace TR; normal LA & RA size; normal LV & RV size & systolic function   . Colonoscopy  2007    Dr. Gala Romney: internal hemorrhoids, benign polyps  . Esophagogastroduodenoscopy  2007    Dr. Gala Romney: normal esophagus, non-critical Schatzki's ring not manipulated, normal small bowel biopsy  . Esophagogastroduodenoscopy (egd) with esophageal dilation N/A 10/16/2013    Dr. Fields:Schatzki's ring at the gastroesophageal junction s/p dilation/MODERATE Erosive gastritis. reactive gastropathy.     Current Outpatient Prescriptions  Medication Sig Dispense Refill  . albuterol (PROAIR HFA) 108 (90 BASE) MCG/ACT inhaler Inhale 2 puffs into the lungs every 6 (six) hours as needed. Shortness of breath    . albuterol (PROVENTIL) (2.5 MG/3ML) 0.083% nebulizer solution USE 1 VIAL IN NEBULIZER THREE TIMES DAILY. 360 mL 2  . ALPRAZolam (XANAX) 0.25 MG tablet Take 1 tablet (0.25 mg total) by mouth 3 (three) times daily as needed for sleep or anxiety. 30 tablet 0  . buPROPion (WELLBUTRIN) 75 MG tablet Take 1 tablet (75 mg total) by mouth 2 (two) times daily. 60 tablet 5  . calcium-vitamin D (OSCAL) 250-125 MG-UNIT per tablet Take 1 tablet by mouth 2 (two) times daily.    Marland Kitchen diltiazem (CARDIZEM) 120 MG tablet Take  1 tablet by mouth daily.  2  . Fluticasone Furoate-Vilanterol (BREO ELLIPTA) 100-25 MCG/INH AEPB Inhale 1 puff into the lungs daily. 3 each 0  . levothyroxine (SYNTHROID, LEVOTHROID) 25 MCG tablet Take 25 mcg by mouth daily.      . Multiple Vitamin (MULTIVITAMIN) capsule Take 1 capsule by mouth daily.      . pantoprazole (PROTONIX) 40 MG tablet Take 1 tablet (40 mg total) by mouth daily before breakfast. 30 tablet 12  .  pravastatin (PRAVACHOL) 40 MG tablet Take 1 tablet (40 mg total) by mouth at bedtime. NEEDS APPOINTMENT FOR FUTURE REFILLS 30 tablet 0  . predniSONE (DELTASONE) 10 MG tablet 4 tabs for 2 days, then 3 tabs for 2 days, 2 tabs for 2 days, then 1 tab for 2 days, then stop 20 tablet 0  . torsemide (DEMADEX) 20 MG tablet Take 20 mg by mouth Daily.    Marland Kitchen tiotropium (SPIRIVA) 18 MCG inhalation capsule Place 1 capsule (18 mcg total) into inhaler and inhale daily. 90 capsule 3   No current facility-administered medications for this visit.   Facility-Administered Medications Ordered in Other Visits  Medication Dose Route Frequency Provider Last Rate Last Dose  . neomycin-polymyxin-dexameth (MAXITROL) 0.1 % ophth ointment    PRN Tonny Branch, MD   1 application at [id number]    Allergies as of 07/18/2015  . (No Known Allergies)    Family History  Problem Relation Age of Onset  . Allergies Father   . Heart disease Mother   . Rheum arthritis Mother   . Breast cancer Daughter   . Breast cancer Sister   . Cancer Maternal Aunt   . Hypertension Mother   . Stroke Maternal Grandmother   . Stroke Sister   . Colon cancer Neg Hx     Social History   Social History  . Marital Status: Married    Spouse Name: N/A  . Number of Children: 2  . Years of Education: N/A   Occupational History  . retired     Theme park manager   Social History Main Topics  . Smoking status: Former Smoker -- 1.00 packs/day for 50 years    Types: Cigarettes    Quit date: 09/07/2008  . Smokeless tobacco: Never Used     Comment: Encouraged to remain smoke free.  . Alcohol Use: No  . Drug Use: No  . Sexual Activity: Not Asked   Other Topics Concern  . None   Social History Narrative    Review of Systems: 10-point ROS negative except as per HPI.   Physical Exam: BP 127/66 mmHg  Pulse 84  Temp(Src) 97.4 F (36.3 C)  Ht 5' (1.524 m)  Wt 114 lb 6.4 oz (51.891 kg)  BMI 22.34 kg/m2 General:   Alert and oriented.  Pleasant and cooperative. Well-nourished and well-developed.  Throat/Neck:  Supple, without mass or thyromegaly. Cardiovascular:  S1, S2 present without murmurs appreciated. Normal pulses noted. Extremities without clubbing or edema. Respiratory:  Clear to auscultation but decreased bilaterally. Wearing oxygen per nasal cannula. No wheezes, rales, or rhonchi. No distress.  Gastrointestinal:  +BS, soft, non-tender and non-distended. No HSM noted. No guarding or rebound. No masses appreciated.  Rectal:  Deferred  Neurologic:  Alert and oriented x4;  grossly normal neurologically. Psych:  Alert and cooperative. Normal mood and affect.    07/18/2015 10:05 AM

## 2015-07-18 NOTE — Telephone Encounter (Signed)
RX's has been sent in. Nothing further needed. email sent to pt

## 2015-07-18 NOTE — Assessment & Plan Note (Signed)
No further abdominal pain since last office visit. This resolved along with her diarrhea. Return for follow-up as needed for any new or worsening symptoms.

## 2015-07-18 NOTE — Telephone Encounter (Signed)
Ok to send Z pack. 500 mg on day 1 and then 250 day 2-5 Prednisone. Start at 40 mg and then reduce by 10 mg every 10 days.

## 2015-07-18 NOTE — Assessment & Plan Note (Signed)
Diarrhea symptoms resolved just after last office visit. Again, she likely had viral gastroenteritis with postinfectious diarrhea. Return for follow-up as needed for any return of symptoms or worsening symptoms. Related to minor bloating, can consider taking probiotic once daily.

## 2015-07-18 NOTE — Progress Notes (Signed)
CC'ED TO PCP 

## 2015-09-16 ENCOUNTER — Other Ambulatory Visit: Payer: Self-pay | Admitting: Critical Care Medicine

## 2015-10-18 ENCOUNTER — Other Ambulatory Visit: Payer: Self-pay | Admitting: Critical Care Medicine

## 2015-10-24 ENCOUNTER — Telehealth: Payer: Self-pay | Admitting: Pulmonary Disease

## 2015-10-24 MED ORDER — TORSEMIDE 20 MG PO TABS: 20.0000 mg | ORAL_TABLET | Freq: Once | ORAL | Status: DC

## 2015-10-24 MED ORDER — TIOTROPIUM BROMIDE MONOHYDRATE 18 MCG IN CAPS: 18.0000 ug | ORAL_CAPSULE | Freq: Every day | RESPIRATORY_TRACT | Status: DC

## 2015-10-24 NOTE — Telephone Encounter (Signed)
Spoke with pt, requesting hard script of spiriva to be left up front for her. [facility]  This has been printed and left up front.  Pt also requesting a refill on her torsemide to [facility].  Pt knows that she usually gets this through her PCP office, but she has been trying to contact her PCP for over 1 week for this refill (pt is out of med) and nobody is responding to her calls or requests through the pharmacy.  Pt wants to know if we can refill this for her for now so she doesn't go without.    PM please advise on torsemide refill.  Thanks!

## 2015-10-24 NOTE — Telephone Encounter (Signed)
Rx has been sent in to [facility] which used to be [facility]. Pt is aware. Nothing further was needed.

## 2015-10-24 NOTE — Telephone Encounter (Signed)
Ok to refill Torsemide.

## 2015-10-28 ENCOUNTER — Telehealth: Payer: Self-pay | Admitting: Pulmonary Disease

## 2015-10-28 MED ORDER — TORSEMIDE 20 MG PO TABS: 20.0000 mg | ORAL_TABLET | Freq: Once | ORAL | Status: DC

## 2015-10-28 NOTE — Telephone Encounter (Signed)
Called and spoke with the patient. Pt states that [facility] and they informed that due to the holiday they have not filled the medication yet. I explained to her that I can send her a weeks worth to the [facility] in [place]. Pt voiced understanding and had no further questions. Rx sent for 1 week. Nothing further needed at this time.

## 2015-10-29 ENCOUNTER — Telehealth: Payer: Self-pay | Admitting: Pulmonary Disease

## 2015-10-29 NOTE — Telephone Encounter (Signed)
LMTCB x 1 

## 2015-10-30 ENCOUNTER — Telehealth: Payer: Self-pay | Admitting: Pulmonary Disease

## 2015-10-30 MED ORDER — TORSEMIDE 20 MG PO TABS: 20.0000 mg | ORAL_TABLET | Freq: Once | ORAL | Status: DC

## 2015-10-30 NOTE — Telephone Encounter (Signed)
Spoke with pt. She is referring to her Torsemide prescription. Rx has been sent in. Nothing further was needed.

## 2015-10-30 NOTE — Telephone Encounter (Signed)
Received faxed from [facility] requesting a refill on pt's Torsemide 20mg , Pantoprazole 40mg , Levothyroxine with PM's signature Per pt's chart, PM okayed to refill the torsemide because pt was having trouble getting it filled by her PCP - we never okayed the Pantoprazole or Levothyroxine  Called [facility] and spoke with pharmacist Roland to discuss the above.  Roland stated that the fax was sent because pt is new to their system and they have nothing in the system for her/PM.  Informed Roland the Pantoprazole and Levothyroxine need to be forwarded to pt's PCP.  Gave verbal authorization for the Torsemide 20mg  QD #90 w/ 1 refill as had been done previously.  Will sign off

## 2015-11-12 DIAGNOSIS — K219 Gastro-esophageal reflux disease without esophagitis: Secondary | ICD-10-CM | POA: Diagnosis not present

## 2015-11-12 DIAGNOSIS — J449 Chronic obstructive pulmonary disease, unspecified: Secondary | ICD-10-CM | POA: Diagnosis not present

## 2015-11-12 DIAGNOSIS — F411 Generalized anxiety disorder: Secondary | ICD-10-CM | POA: Diagnosis not present

## 2015-11-12 DIAGNOSIS — F339 Major depressive disorder, recurrent, unspecified: Secondary | ICD-10-CM | POA: Diagnosis not present

## 2015-11-27 ENCOUNTER — Other Ambulatory Visit: Payer: Self-pay | Admitting: Acute Care

## 2015-11-27 DIAGNOSIS — Z87891 Personal history of nicotine dependence: Secondary | ICD-10-CM

## 2015-11-27 DIAGNOSIS — E782 Mixed hyperlipidemia: Secondary | ICD-10-CM | POA: Diagnosis not present

## 2015-11-27 DIAGNOSIS — E039 Hypothyroidism, unspecified: Secondary | ICD-10-CM | POA: Diagnosis not present

## 2015-11-29 DIAGNOSIS — E039 Hypothyroidism, unspecified: Secondary | ICD-10-CM | POA: Diagnosis not present

## 2015-11-29 DIAGNOSIS — J449 Chronic obstructive pulmonary disease, unspecified: Secondary | ICD-10-CM | POA: Diagnosis not present

## 2015-11-29 DIAGNOSIS — E782 Mixed hyperlipidemia: Secondary | ICD-10-CM | POA: Diagnosis not present

## 2015-12-04 ENCOUNTER — Other Ambulatory Visit: Payer: Self-pay | Admitting: Pulmonary Disease

## 2015-12-05 MED ORDER — ALBUTEROL SULFATE (2.5 MG/3ML) 0.083% IN NEBU: 2.5000 mg | INHALATION_SOLUTION | Freq: Three times a day (TID) | RESPIRATORY_TRACT | Status: DC

## 2015-12-11 ENCOUNTER — Ambulatory Visit (INDEPENDENT_AMBULATORY_CARE_PROVIDER_SITE_OTHER): Payer: Medicare Other | Admitting: Pulmonary Disease

## 2015-12-11 ENCOUNTER — Encounter: Payer: Self-pay | Admitting: Pulmonary Disease

## 2015-12-11 VITALS — BP 124/68 | HR 81 | Temp 98.5°F | Ht 60.0 in | Wt 109.0 lb

## 2015-12-11 DIAGNOSIS — J449 Chronic obstructive pulmonary disease, unspecified: Secondary | ICD-10-CM

## 2015-12-11 DIAGNOSIS — J441 Chronic obstructive pulmonary disease with (acute) exacerbation: Secondary | ICD-10-CM

## 2015-12-11 NOTE — Patient Instructions (Addendum)
Continue using a Breo and Spiriva. We'll renew your Proventil inhaler.  Continue to monitor symptom. If they should worsen then start using the prednisone that you have at home and call us for an acute visit.  Return to clinic in 6 months.

## 2015-12-11 NOTE — Progress Notes (Signed)
Subjective:     Patient ID: [name], female   DOB: 1940/12/07, 75 y.o.   MRN: [id number]  HPI Follow-up for COPD.  [name] is 75 year old with very severe COPD she is a former patient of Dr. Joya Martin. She's been maintained on by mouth and Spiriva with good control of symptoms. She is on home O2 24/7. She reports increasing cough with yellow sputum production over the weekend with increased dyspnea. No fevers or chills. However her symptoms have improved over the past day.  DATA: PFTs 06/13/11 FVC 1.51 [60%] FEV1 0.39 (21%) F/F 26 Very severe obstruction.  CT screening of the chest 07/17/15 Rad1 category. No acute findings.  Social History: 50-pack-year smoking. Quit in 2010. No alcohol, drug use.  Family History: Mother-heart disease, hypertension, rheumatoid arthritis. Father-allergies Sister-breast cancer, stroke  Review of Systems Cough, sputum production, dyspnea, wheezing. No fevers, chills, hemoptysis. No chest pain, palpitations. No nausea, vomiting, diarrhea, constipation. All other review of systems are negative    Objective:   Physical Exam Blood pressure 124/68, pulse 81, temperature 98.5 F (36.9 C), temperature source Oral, height 5' (1.524 m), weight 109 lb (49.442 kg), SpO2 97 %. Gen: No apparent distress Neuro: No gross focal deficits. HEENT: No JVD, lymphadenopathy, thyromegaly. RS: Clear, No wheeze or crackles CVS: S1-S2 heard, no murmurs rubs gallops. Abdomen: Soft, positive bowel sounds. Musculoskeletal: No edema.    Assessment/Plan  Severe COPD with frequent exacerbations the past.  She is maintained on breo and spiriva with stabilization. She did have some worsening of her symptoms over the weekend but appears to be okay now. She is not wheezing in the office today and I will not give her any prednisone. She does have a course of prednisone at home which is unused. If her symptoms worsen she will call us and start the taper.  Plan: -  Continue Breo, Spiriva, supplemental O2 - Call office if symptoms worsen.  Penny Garfinkel MD [facility] Pager [phone] If no answer or after 3pm call: [phone] 12/11/2015, 3:27 PM

## 2015-12-12 ENCOUNTER — Telehealth: Payer: Self-pay | Admitting: Pulmonary Disease

## 2015-12-12 MED ORDER — LEVOFLOXACIN 500 MG PO TABS: 500.0000 mg | ORAL_TABLET | Freq: Every day | ORAL | Status: DC

## 2015-12-12 MED ORDER — ALBUTEROL SULFATE HFA 108 (90 BASE) MCG/ACT IN AERS: 2.0000 | INHALATION_SPRAY | Freq: Four times a day (QID) | RESPIRATORY_TRACT | Status: DC | PRN

## 2015-12-12 NOTE — Telephone Encounter (Signed)
Levaquin 500mg daily for 7 days.

## 2015-12-12 NOTE — Telephone Encounter (Signed)
Spoke with pt and gave recommendations. Rx's sent. Nothing further needed.

## 2015-12-12 NOTE — Telephone Encounter (Signed)
Pt calling to f/u on this [phone]

## 2015-12-12 NOTE — Telephone Encounter (Signed)
Pt seen Dr Vaughan Browner in the office yesterday 12/11/15.  Was told to cough our office if symptoms worsened and possibly start Pred taper she has on hand.  Pt states that she continues to have prod cough but now had turned dark yellow, sob is about the same.  Pt started pred taper this morning but is requesting and antiobiotic for colored sputum.  Please advise.  No Known Allergies

## 2015-12-16 ENCOUNTER — Telehealth: Payer: Self-pay | Admitting: Pulmonary Disease

## 2015-12-16 MED ORDER — AZITHROMYCIN 250 MG PO TABS: ORAL_TABLET | ORAL | Status: AC

## 2015-12-16 NOTE — Telephone Encounter (Signed)
Spoke with pt. She is aware of PM's recommendation. Rx has been sent in. Nothing further was needed. 

## 2015-12-16 NOTE — Telephone Encounter (Signed)
Finish levaquin and then use the Z pack if needed.

## 2015-12-16 NOTE — Telephone Encounter (Signed)
Please give her a Zpack. 500 mg on day 1 and then 250 mg for total 5 days. If she does not improve then she will need a clinic visit and CXR.

## 2015-12-16 NOTE — Telephone Encounter (Signed)
Attempted to send in zpak rx.  Received the following override between levaquin and zpak:  Warning: The risk of serious or life-threatening cardiac arrhythmias due to QT interval prolongation may be increased with co-administration of azithromycin and levofloxacin. Patients at increased risk may include older females with underlying cardiovascular or other chronic diseases.   --------  Spoke with pt.  Pt states she has not yet completed the levaquin course given on 12/12/15 x 7 days.  She still has 2 days of levaquin left.  Dr. Vaughan Browner, Do you want pt to finish levaquin then take zpak if needed?  Or, stop levaquin and change to zpak?  Please advise on your recs.  Thank you!

## 2015-12-16 NOTE — Telephone Encounter (Signed)
Spoke with pt. Reports that she is not feeling any better since seeing PM on 12/11/15. Still having issues with chest congestion, cough, chest tightness and weakness. Denies SOB, wheezing or fever at this time. Cough is producing dark yellow mucus. Would like another round of antibiotics.  PM - please advise. Thanks.

## 2015-12-26 ENCOUNTER — Telehealth: Payer: Self-pay | Admitting: Pulmonary Disease

## 2015-12-26 DIAGNOSIS — J441 Chronic obstructive pulmonary disease with (acute) exacerbation: Secondary | ICD-10-CM

## 2015-12-26 NOTE — Telephone Encounter (Signed)
Attempted to call pt. Line was busy. Unable to LVM.

## 2015-12-27 ENCOUNTER — Ambulatory Visit (INDEPENDENT_AMBULATORY_CARE_PROVIDER_SITE_OTHER)
Admission: RE | Admit: 2015-12-27 | Discharge: 2015-12-27 | Disposition: A | Discharge status: Home / Self Care | Payer: Medicare Other | Source: Ambulatory Visit | Attending: Acute Care | Admitting: Acute Care

## 2015-12-27 ENCOUNTER — Ambulatory Visit (INDEPENDENT_AMBULATORY_CARE_PROVIDER_SITE_OTHER): Payer: Medicare Other | Admitting: Acute Care

## 2015-12-27 ENCOUNTER — Encounter: Payer: Self-pay | Admitting: Acute Care

## 2015-12-27 VITALS — BP 144/68 | HR 90 | Temp 97.7°F | Ht 60.0 in | Wt 106.2 lb

## 2015-12-27 DIAGNOSIS — J441 Chronic obstructive pulmonary disease with (acute) exacerbation: Secondary | ICD-10-CM

## 2015-12-27 DIAGNOSIS — J449 Chronic obstructive pulmonary disease, unspecified: Secondary | ICD-10-CM | POA: Diagnosis not present

## 2015-12-27 NOTE — Progress Notes (Signed)
Subjective:    Patient ID: [name], female    DOB: [birth year], 75 y.o.   MRN: [id number]  HPI  [name] is 75 year old with very severe COPD she is a former patient of Dr. Joya Gaskins. She's been maintained on Breo and Spiriva with good control of symptoms. She is on home O2 24/7.  DATA: PFTs 06/13/11 FVC 1.51 [60%] FEV1 0.39 (21%) F/F 26 Very severe obstruction.  CT screening of the chest 07/17/15 Rad1 category. No acute findings.  Social History: 50-pack-year smoking. Quit in 2010. No alcohol, drug use.  Significant Events: 12/08/2015:  Cough/dyspnea/ yellow sputum 12/12/2015: Called in Levaquin 500 mg  X 7 days 12/16/2015 Called in Z pack to complete after Levaquin completed.   12/27/2015:CXR:  No pulmonary infiltrate, pleural effusion or pneumothorax. IMPRESSION: COPD changes. No acute abnormalities.  12/27/2015  Patient presents to the office today stating she is no better after 2 rounds of antibiotics.She continues to complain of SOB, non-productive cough, wheezing and chest tightness and fatigue. She denies fever, chest pain,productive cough, orthopnea, hemoptysis, leg or calf pain. She states she is worried this is pneumonia. CXR today indicates no pulmonary infiltrate/ pleural effusion / acute abnormalities. She is still completing a prednisone taper( I cannot see this on med list, ? per PCP in [place]). I had requested that she get a CBC and BMET while getting her CXR, but she failed to do so. We will have this drawn Monday to evaluate for anemia as the etiology of her fatigue. I discussed this case with Dr. Melvyn Novas.   Current outpatient prescriptions:  .  albuterol (PROAIR HFA) 108 (90 Base) MCG/ACT inhaler, Inhale 2 puffs into the lungs every 6 (six) hours as needed. Shortness of breath, Disp: 1 Inhaler, Rfl: 3 .  albuterol (PROVENTIL) (2.5 MG/3ML) 0.083% nebulizer solution, Take 3 mLs (2.5 mg total) by nebulization 3 (three) times daily., Disp: 360 mL, Rfl: 2 .   ALPRAZolam (XANAX) 0.25 MG tablet, Take 1 tablet (0.25 mg total) by mouth 3 (three) times daily as needed for sleep or anxiety., Disp: 30 tablet, Rfl: 0 .  buPROPion (WELLBUTRIN) 75 MG tablet, Take 1 tablet (75 mg total) by mouth 2 (two) times daily., Disp: 60 tablet, Rfl: 5 .  calcium-vitamin D (OSCAL) 250-125 MG-UNIT per tablet, Take 1 tablet by mouth 2 (two) times daily., Disp: , Rfl:  .  diltiazem (CARDIZEM) 120 MG tablet, Take 1 tablet by mouth daily., Disp: , Rfl: 2 .  Fluticasone Furoate-Vilanterol (BREO ELLIPTA) 100-25 MCG/INH AEPB, Inhale 1 puff into the lungs daily., Disp: 3 each, Rfl: 0 .  levothyroxine (SYNTHROID, LEVOTHROID) 25 MCG tablet, Take 25 mcg by mouth daily.  , Disp: , Rfl:  .  Multiple Vitamin (MULTIVITAMIN) capsule, Take 1 capsule by mouth daily.  , Disp: , Rfl:  .  pantoprazole (PROTONIX) 40 MG tablet, Take 1 tablet (40 mg total) by mouth daily before breakfast., Disp: 30 tablet, Rfl: 12 .  pravastatin (PRAVACHOL) 40 MG tablet, Take 1 tablet (40 mg total) by mouth at bedtime. NEEDS APPOINTMENT FOR FUTURE REFILLS, Disp: 30 tablet, Rfl: 0 .  tiotropium (SPIRIVA) 18 MCG inhalation capsule, Place 1 capsule (18 mcg total) into inhaler and inhale daily., Disp: 90 capsule, Rfl: 3 .  torsemide (DEMADEX) 20 MG tablet, Take 1 tablet (20 mg total) by mouth once., Disp: 90 tablet, Rfl: 1 No current facility-administered medications for this visit.  Facility-Administered Medications Ordered in Other Visits:  .  neomycin-polymyxin-dexameth (MAXITROL) 0.1 % ophth  ointment, , , PRN, Tonny Branch, MD, 1 application at [id number]   Past Medical History  Diagnosis Date  . COPD (chronic obstructive pulmonary disease) ([place])     home O2  . Hypertension   . Hyperlipidemia   . History of tobacco abuse   . Mild depression   . Hypothyroidism   . Diastolic dysfunction   . History of nuclear stress test 09/23/2009    dipyridamole; normal pattern of perfusion; low risk scan   . CHF  (congestive heart failure) ([facility])     No Known Allergies      Review of Systems Constitutional:   No  weight loss, night sweats,  Fevers, chills,+ fatigue, or  lassitude.  HEENT:   No headaches,  Difficulty swallowing,  Tooth/dental problems, or  Sore throat,                No sneezing, itching, ear ache, nasal congestion, post nasal drip,   CV:  No chest pain,  Orthopnea, PND, swelling in lower extremities, anasarca, dizziness, palpitations, syncope.   GI  No heartburn, indigestion, abdominal pain, nausea, vomiting, diarrhea, change in bowel habits, loss of appetite, bloody stools.   Resp: + shortness of breath with exertion not at rest.  No excess mucus, no productive cough,  + non-productive cough,  No coughing up of blood.  No change in color of mucus.  + wheezing.  No chest wall deformity  Skin: no rash or lesions.  GU: no dysuria, change in color of urine, no urgency or frequency.  No flank pain, no hematuria   MS:  No joint pain or swelling.  No decreased range of motion.  No back pain.  Psych:  No change in mood or affect. No depression or anxiety.  No memory loss.        Objective:   Physical Exam  BP 144/68 mmHg  Pulse 90  Temp(Src) 97.7 F (36.5 C) (Oral)  Ht 5' (1.524 m)  Wt 106 lb 3.2 oz (48.172 kg)  BMI 20.74 kg/m2  SpO2 97%   Physical Exam:  General- No distress,  A&Ox3, very pleasant ENT: No sinus tenderness, TM clear, pale nasal mucosa, no oral exudate,no post nasal drip, no LAN, No JVD Cardiac: S1, S2, regular rate and rhythm, no murmur Chest: + wheeze/ no rales/ dullness; no accessory muscle use, no nasal flaring, no sternal retractions Abd.: Soft Non-tender Ext: No clubbing cyanosis, edema Neuro:  normal strength Skin: No rashes, warm and dry Psych: normal mood and behavior  Magdalen Spatz, AGACNP-BC [facility] 12/27/2015      Assessment & Plan:

## 2015-12-27 NOTE — Telephone Encounter (Signed)
I would prefer for her to either stay with dr Vaughan Browner or to join clinic of a newer partner with more clinic availability and easier access if she gets ill. ? Possibly dr Ashok Cordia or de dios.

## 2015-12-27 NOTE — Telephone Encounter (Signed)
Patient says she has taken 2 rounds of antibiotics, she is so weak that she can hardly walk.  She said that she cannot even take a shower, she has to do sponge baths using the sink.  She said that she has developed a non-productive cough.  Patient scheduled to see Judson Roch today at 16, given ER precautions if worsens between now and 430.    CXR has not been done.  Nor Bloodwork.  Sarah, do you want me to order her a CXR and BMET/CBC prior to OV?  Please advise.  ------------------------------------- 2nd part of message: Patient wants to switch doctors.  She said she would prefer to see Dr. Lamonte Sakai instead of Dr. Vaughan Browner.  Dr. Vaughan Browner, ok with switch? Dr. Lamonte Sakai, ok with switch?  Please advise.

## 2015-12-27 NOTE — Telephone Encounter (Signed)
[name], please order  CXR, BMET, CBC and lets try to get them done as soon as she arrives so we have them when I see her. Thanks

## 2015-12-27 NOTE — Telephone Encounter (Signed)
Ok with me 

## 2015-12-27 NOTE — Assessment & Plan Note (Signed)
Slow to resolve exacerbation No better after Levaquin and Z Pack Current prednisone taper by out of system PCP Plan: CXR: no infiltrate/ effusion/acute process ( Reviewed personally by me) We will order a cbc with Diff to be done at [facility] on Monday. We will call you with results. Continue the prednisone taper Continue your Breo and Spiriva Continue using your nebulizer treatments for shortness of breath of wheezing. Continue wearing your oxygen at 3 L as needed.  Try adding Delsym cough syrup every 12 hours as needed for cough. Follow up with [place] in 1 week to make sure you are improving. Please contact office for sooner follow up if symptoms do not improve or worsen or seek emergency care

## 2015-12-27 NOTE — Telephone Encounter (Signed)
cxr and labs ordered.   JN please advise if you're ok with taking on this patient.  Thanks!

## 2015-12-27 NOTE — Patient Instructions (Signed)
It is nice to see you today. CXR today was clear, no pneumonia We will order a cbc with Diff to be done at [facility] on Monday. We will call you with results. Continue the prednisone taper Continue your Breo and Spiriva Continue using your nebulizer treatments for shortness of breath of wheezing. Continue wearing your oxygen at 3 L as needed.  Try adding Delsym cough syrup every 12 hours as needed for cough. Follow up with [place] in 1 week to make sure you are improving. Please contact office for sooner follow up if symptoms do not improve or worsen or seek emergency care

## 2015-12-29 NOTE — Telephone Encounter (Signed)
That’s fine with me

## 2015-12-30 ENCOUNTER — Other Ambulatory Visit (HOSPITAL_COMMUNITY)
Admission: RE | Admit: 2015-12-30 | Discharge: 2015-12-30 | Disposition: A | Discharge status: Home / Self Care | Payer: Medicare Other | Source: Ambulatory Visit | Attending: Acute Care | Admitting: Acute Care

## 2015-12-30 DIAGNOSIS — J441 Chronic obstructive pulmonary disease with (acute) exacerbation: Secondary | ICD-10-CM | POA: Insufficient documentation

## 2015-12-30 LAB — BASIC METABOLIC PANEL
Anion gap: 11 (ref 5–15)
BUN: 29 mg/dL — AB (ref 6–20)
CALCIUM: 8.8 mg/dL — AB (ref 8.9–10.3)
CO2: 35 mmol/L — AB (ref 22–32)
CREATININE: 0.83 mg/dL (ref 0.44–1.00)
Chloride: 96 mmol/L — ABNORMAL LOW (ref 101–111)
GFR calc Af Amer: >60 mL/min (ref >60)
GLUCOSE: 117 mg/dL — AB (ref 65–99)
Potassium: 2.9 mmol/L — ABNORMAL LOW (ref 3.5–5.1)
Sodium: 142 mmol/L (ref 135–145)

## 2015-12-30 LAB — CBC WITH DIFFERENTIAL/PLATELET
Basophils Absolute: 0 10*3/uL (ref 0.0–0.1)
Basophils Relative: 0 %
EOS PCT: 2 %
Eosinophils Absolute: 0.3 10*3/uL (ref 0.0–0.7)
HEMATOCRIT: 40.6 % (ref 36.0–46.0)
Hemoglobin: 13.1 g/dL (ref 12.0–15.0)
LYMPHS PCT: 20 %
Lymphs Abs: 2.9 10*3/uL (ref 0.7–4.0)
MCH: 27.7 pg (ref 26.0–34.0)
MCHC: 32.3 g/dL (ref 30.0–36.0)
MCV: 85.8 fL (ref 78.0–100.0)
MONO ABS: 1.5 10*3/uL — AB (ref 0.1–1.0)
MONOS PCT: 10 %
NEUTROS ABS: 10.3 10*3/uL — AB (ref 1.7–7.7)
Neutrophils Relative %: 68 %
PLATELETS: 348 10*3/uL (ref 150–400)
RBC: 4.73 MIL/uL (ref 3.87–5.11)
RDW: 13.3 % (ref 11.5–15.5)
WBC: 15 10*3/uL — ABNORMAL HIGH (ref 4.0–10.5)

## 2015-12-30 NOTE — Telephone Encounter (Signed)
Spoke with pt. She has been scheduled to see JN on 01/09/16 at 9:45am. Nothing further was needed.

## 2015-12-31 ENCOUNTER — Telehealth: Payer: Self-pay | Admitting: Acute Care

## 2015-12-31 NOTE — Telephone Encounter (Signed)
I called the patient with her CBC results. I explained that her white count was still a bit elevated. She denies any fever or worsening of symptoms. She states that she is actually better today than she has been in awhile. No S/S of UTI, no purulent drainage/secretions.CXR 12/27/2015 showed no acute abnormalities. He potassium was also low, at 2.9. She is drinking orange juice and eating bananas, but may need some PO supplementation if it does not respond by her 01/10/2016 appointment with Dr. Ashok Cordia.She verbalized understanding of the above and had no further questions.

## 2016-01-01 ENCOUNTER — Telehealth: Payer: Self-pay | Admitting: Acute Care

## 2016-01-01 ENCOUNTER — Other Ambulatory Visit: Payer: Self-pay | Admitting: Acute Care

## 2016-01-01 DIAGNOSIS — I509 Heart failure, unspecified: Secondary | ICD-10-CM

## 2016-01-01 MED ORDER — POTASSIUM CHLORIDE 20 MEQ PO PACK: 40.0000 meq | PACK | Freq: Every day | ORAL | Status: DC

## 2016-01-01 NOTE — Telephone Encounter (Signed)
I have called the patient to let her know I have phoned in a prescription for potassium supplementation.I explained that she needs to take 40 mEq of potassium today, and 40 mEq tomorrow and then stop. She will have repeat BMET  01/09/16 when comes to the office for her appointment with Dr. Ashok Cordia.She is currently taking Demedex daily with no potassium supplementation. She confirmed that she is not taking any other source of potassium.She verbalized understanding of the above and had no further questions.

## 2016-01-02 NOTE — Telephone Encounter (Signed)
Spoke with pharmacist at [facility] in [place] and informed that it is ok to give Potassium tablets instead of packets.

## 2016-01-03 ENCOUNTER — Ambulatory Visit: Payer: Medicare Other | Admitting: Acute Care

## 2016-01-04 ENCOUNTER — Other Ambulatory Visit: Payer: Self-pay | Admitting: Acute Care

## 2016-01-04 DIAGNOSIS — I509 Heart failure, unspecified: Secondary | ICD-10-CM

## 2016-01-06 ENCOUNTER — Telehealth: Payer: Self-pay | Admitting: Pulmonary Disease

## 2016-01-06 MED ORDER — POTASSIUM CHLORIDE 20 MEQ PO PACK: 40.0000 meq | PACK | Freq: Every day | ORAL | Status: DC

## 2016-01-06 NOTE — Telephone Encounter (Signed)
Spoke with Apolonio Schneiders at [facility]. Pt's K+ prescription was sent incorrectly. This has been fixed verbally. Nothing further was needed.

## 2016-01-09 ENCOUNTER — Telehealth: Payer: Self-pay | Admitting: Pulmonary Disease

## 2016-01-09 ENCOUNTER — Other Ambulatory Visit: Payer: Medicare Other

## 2016-01-09 ENCOUNTER — Encounter: Payer: Self-pay | Admitting: Pulmonary Disease

## 2016-01-09 ENCOUNTER — Ambulatory Visit (INDEPENDENT_AMBULATORY_CARE_PROVIDER_SITE_OTHER): Payer: Medicare Other | Admitting: Pulmonary Disease

## 2016-01-09 VITALS — BP 122/70 | HR 85 | Ht 60.0 in | Wt 108.6 lb

## 2016-01-09 DIAGNOSIS — J438 Other emphysema: Secondary | ICD-10-CM | POA: Diagnosis not present

## 2016-01-09 DIAGNOSIS — J449 Chronic obstructive pulmonary disease, unspecified: Secondary | ICD-10-CM

## 2016-01-09 DIAGNOSIS — J9611 Chronic respiratory failure with hypoxia: Secondary | ICD-10-CM | POA: Diagnosis not present

## 2016-01-09 DIAGNOSIS — R0902 Hypoxemia: Secondary | ICD-10-CM | POA: Diagnosis not present

## 2016-01-09 NOTE — Telephone Encounter (Signed)
Patient refused PFT and 6 MW FYI to Dr. Ashok Cordia

## 2016-01-09 NOTE — Patient Instructions (Addendum)
   Continue using your oxygen and inhalers as prescribed.  Call me if you have any new breathing problems before your next appointment  Laboratory breathing and walking test at her next appointment  I will see you back in 3 months or sooner if needed  TESTS ORDERED: 1. Full PFT at f/u appointment 2. 6MWT at f/u appointment on oxygen - titrated 3. Alpha-1 Antitrypsin Phenotype today

## 2016-01-09 NOTE — Progress Notes (Signed)
Subjective:    Patient ID: [name], female    DOB: [birth year], 75 y.o.   MRN: [id number]  C.C.:  Follow-up for Very Severe COPD w/ Emphysema & Chronic Hypoxic Respiratory Failure.  HPI Very Severe COPD w/ Emphysema:  Current prescribed Breo & Spiriva. She reports she rarely uses her rescue inhaler. No exacerbations since last appointment. Denies any change from her baseline dyspnea. Intermittent baseline cough & wheezing.   Chronic Hypoxic Respiratory Failure:  Previously prescribed oxygen at 2 L/m at rest & 3 L/m with exertion. She is currently using liquid oxygen with pulse dosing.   Review of Systems She reports significant fatigue which was attributed to her low potassium. She denies any myalgias. No chest pain or pressure. No fever, chills, or sweats.   No Known Allergies  Current Outpatient Prescriptions on File Prior to Visit  Medication Sig Dispense Refill  . albuterol (PROAIR HFA) 108 (90 Base) MCG/ACT inhaler Inhale 2 puffs into the lungs every 6 (six) hours as needed. Shortness of breath 1 Inhaler 3  . albuterol (PROVENTIL) (2.5 MG/3ML) 0.083% nebulizer solution Take 3 mLs (2.5 mg total) by nebulization 3 (three) times daily. 360 mL 2  . ALPRAZolam (XANAX) 0.25 MG tablet Take 1 tablet (0.25 mg total) by mouth 3 (three) times daily as needed for sleep or anxiety. 30 tablet 0  . buPROPion (WELLBUTRIN) 75 MG tablet Take 1 tablet (75 mg total) by mouth 2 (two) times daily. 60 tablet 5  . calcium-vitamin D (OSCAL) 250-125 MG-UNIT per tablet Take 1 tablet by mouth 2 (two) times daily.    Marland Kitchen diltiazem (CARDIZEM) 120 MG tablet Take 1 tablet by mouth daily.  2  . Fluticasone Furoate-Vilanterol (BREO ELLIPTA) 100-25 MCG/INH AEPB Inhale 1 puff into the lungs daily. 3 each 0  . levothyroxine (SYNTHROID, LEVOTHROID) 25 MCG tablet Take 25 mcg by mouth daily.      . Multiple Vitamin (MULTIVITAMIN) capsule Take 1 capsule by mouth daily.      . pantoprazole (PROTONIX) 40 MG tablet Take 1  tablet (40 mg total) by mouth daily before breakfast. 30 tablet 12  . pravastatin (PRAVACHOL) 40 MG tablet Take 1 tablet (40 mg total) by mouth at bedtime. NEEDS APPOINTMENT FOR FUTURE REFILLS 30 tablet 0  . tiotropium (SPIRIVA) 18 MCG inhalation capsule Place 1 capsule (18 mcg total) into inhaler and inhale daily. 90 capsule 3  . torsemide (DEMADEX) 20 MG tablet Take 1 tablet (20 mg total) by mouth once. (Patient taking differently: Take 20 mg by mouth daily. ) 90 tablet 1   Current Facility-Administered Medications on File Prior to Visit  Medication Dose Route Frequency Provider Last Rate Last Dose  . neomycin-polymyxin-dexameth (MAXITROL) 0.1 % ophth ointment    PRN Tonny Branch, MD   1 application at [id number]    Past Medical History  Diagnosis Date  . COPD (chronic obstructive pulmonary disease) ([place])     home O2  . Hypertension   . Hyperlipidemia   . History of tobacco abuse   . Mild depression   . Hypothyroidism   . Diastolic dysfunction   . History of nuclear stress test 09/23/2009    dipyridamole; normal pattern of perfusion; low risk scan   . CHF (congestive heart failure) [facility])     Past Surgical History  Procedure Laterality Date  . Gallbladder surgery  1977  . Total abdominal hysterectomy  1980's  . Cataract extraction w/phaco  09/08/2012    Procedure: CATARACT EXTRACTION PHACO  AND INTRAOCULAR LENS PLACEMENT (IOC);  Surgeon: Tonny Branch, MD;  Location: [facility];  Service: Ophthalmology;  Laterality: Right;  CDE:19.67  . Tonsillectomy    . Right carpal tunnel release    . Cataract extraction w/phaco  09/19/2012    Procedure: CATARACT EXTRACTION PHACO AND INTRAOCULAR LENS PLACEMENT (IOC);  Surgeon: Tonny Branch, MD;  Location: [facility];  Service: Ophthalmology;  Laterality: Left;  CDE:19.83  . Transthoracic echocardiogram  09/23/2009    EF>55%; trace TR; normal LA & RA size; normal LV & RV size & systolic function   . Colonoscopy  2007    Dr. Gala Romney: internal hemorrhoids, benign  polyps  . Esophagogastroduodenoscopy  2007    Dr. Gala Romney: normal esophagus, non-critical Schatzki's ring not manipulated, normal small bowel biopsy  . Esophagogastroduodenoscopy (egd) with esophageal dilation N/A 10/16/2013    Dr. Fields:Schatzki's ring at the gastroesophageal junction s/p dilation/MODERATE Erosive gastritis. reactive gastropathy.     Family History  Problem Relation Age of Onset  . Allergies Father   . Heart disease Mother   . Rheum arthritis Mother   . Breast cancer Daughter   . Breast cancer Sister   . Cancer Maternal Aunt   . Hypertension Mother   . Stroke Maternal Grandmother   . Stroke Sister   . Colon cancer Neg Hx     Social History   Social History  . Marital Status: Married    Spouse Name: N/A  . Number of Children: 2  . Years of Education: N/A   Occupational History  . retired     Theme park manager   Social History Main Topics  . Smoking status: Former Smoker -- 1.00 packs/day for 50 years    Types: Cigarettes    Quit date: 09/07/2008  . Smokeless tobacco: Never Used  . Alcohol Use: No  . Drug Use: No  . Sexual Activity: Not Asked   Other Topics Concern  . None   Social History Narrative   Originally from [place]. She has lived in [place], [place], [place]. Currently has a dog & also a cat. No bird or mold exposure. Does have a hot tub but doesn't use it.       Objective:   Physical Exam BP 122/70 mmHg  Pulse 85  Ht 5' (1.524 m)  Wt 108 lb 9.6 oz (49.261 kg)  BMI 21.21 kg/m2  SpO2 97% General:  Awake. Alert. No acute distress. Elderly female.  Integument:  Warm & dry. No rash on exposed skin. No bruising. HEENT:  Moist mucus membranes. No oral ulcers. No scleral injection or icterus.  Cardiovascular:  Regular rate. No edema. No appreciable JVD.  Pulmonary:  Symmetrically decreased breath sounds. Otherwise clear to auscultation. Normal work of breathing on supplemental oxygen . Abdomen: Soft. Normal bowel sounds. Nondistended.  Musculoskeletal:   Normal bulk and tone. No joint deformity or effusion appreciated.  PFT 07/14/11: FVC 1.51 L (63%) FEV1 0.39 L (21%) FEV1/FVC 0.26 FEF 25-75 0.15 L (8%)  IMAGING CXR PA/LAT 12/27/15 (personally reviewed by me): No parenchymal nodule or opacity appreciated. No pleural effusion or thickening appreciated. Flattening of the diaphragms and of the chest consistent with hyperinflation. Heart normal in size & mediastinum normal in contour.  LD CHEST CT SCREENING 07/17/15 (per radiologist): No pleural fluid. Moderate to severe centrilobular emphysema. Lower lobe predominant bronchial wall thickening. Fluid or secretions within left lower lobe bronchi as well as right lower lobe. No suspicious pulmonary nodule or mass. Volume loss and interstitial thickening involving left  lower lobe. Well-circumscribed low-density liver lesions likely cysts noted. Renal cyst also noted on left. T12 hemangioma noted.  LABS 12/30/15 CBC: 15.0/13.1/40.02/3047 BMP: 142/2.9/96/35/29/0.83/117/8.8   09/30/14 ABG on 3 L/m:  7.478 / 44.5 / 150 / 98.9%    Assessment & Plan:  75 year old female with very severe COPD with emphysema. Likely secondary to prior history of tobacco use. I reviewed her previous spirometry which does indeed show very severe COPD. I also reviewed her chest x-ray imaging from April which shows no parenchymal opacity or nodule. The patient is concerned regarding her diffuse weakness which has previously been attributed to her hypokalemia. I urged her to address these issues with her primary care physician. I instructed the patient to contact my office if she had any new breathing problems before her next appointment.  1. Very severe COPD with emphysema: Continuing Breol & Spiriva. Checking full pulmonary function testing at follow-up appointment. Screening for alpha-1 antitrypsin deficiency today 2. Chronic hypoxic respiratory failure: Continuing on oxygen as previously prescribed. Checking 6 minute walk test with  oxygen titration at next appointment. 3. Health maintenance: Received influenza vaccine September 2016, Prevnar January 2015, & Pneumovax October 2012. 4. Follow-up: Patient to return to clinic in 3 months or sooner if needed.  Sonia Baller Ashok Cordia, M.D. [facility] Pager:  [phone] After 3pm or if no response, call [phone] 10:51 AM 01/09/2016

## 2016-01-09 NOTE — Telephone Encounter (Signed)
Did she give a reason?  

## 2016-01-10 NOTE — Telephone Encounter (Signed)
Patient states that she is unable to walk, she says her legs are too weak to walk.  Advised patient that her appointment is not until August. Asked her if she could discuss this with her PCP to get her legs strengthened.  She said she would go see her PCP.  She decided to go ahead and do the testing.  I told her that even if she doesn't think she can do the walk, she can do the PFT.  She said that when it gets closer to August she would call us back and let us know if she cannot do it.  FYI to Dr. Ashok Cordia

## 2016-01-11 DIAGNOSIS — F411 Generalized anxiety disorder: Secondary | ICD-10-CM | POA: Diagnosis not present

## 2016-01-11 DIAGNOSIS — E876 Hypokalemia: Secondary | ICD-10-CM | POA: Diagnosis not present

## 2016-01-11 DIAGNOSIS — F339 Major depressive disorder, recurrent, unspecified: Secondary | ICD-10-CM | POA: Diagnosis not present

## 2016-01-17 LAB — ALPHA-1 ANTITRYPSIN PHENOTYPE: A-1 Antitrypsin: 206 mg/dL — ABNORMAL HIGH (ref 83–199)

## 2016-01-25 DIAGNOSIS — R531 Weakness: Secondary | ICD-10-CM | POA: Diagnosis not present

## 2016-01-25 DIAGNOSIS — F419 Anxiety disorder, unspecified: Secondary | ICD-10-CM | POA: Diagnosis not present

## 2016-01-25 DIAGNOSIS — R4181 Age-related cognitive decline: Secondary | ICD-10-CM | POA: Diagnosis not present

## 2016-02-11 ENCOUNTER — Ambulatory Visit (HOSPITAL_COMMUNITY): Payer: Medicare Other | Admitting: Physical Therapy

## 2016-02-19 ENCOUNTER — Ambulatory Visit (HOSPITAL_COMMUNITY): Payer: Medicare Other | Admitting: Physical Therapy

## 2016-02-19 ENCOUNTER — Telehealth (HOSPITAL_COMMUNITY): Payer: Self-pay

## 2016-02-19 NOTE — Telephone Encounter (Signed)
Patient don't feel like coming today.

## 2016-02-26 ENCOUNTER — Ambulatory Visit (HOSPITAL_COMMUNITY): Payer: Medicare Other | Attending: Internal Medicine

## 2016-02-26 DIAGNOSIS — M6281 Muscle weakness (generalized): Secondary | ICD-10-CM | POA: Diagnosis not present

## 2016-02-26 DIAGNOSIS — R2689 Other abnormalities of gait and mobility: Secondary | ICD-10-CM | POA: Diagnosis not present

## 2016-02-26 DIAGNOSIS — R2681 Unsteadiness on feet: Secondary | ICD-10-CM | POA: Diagnosis not present

## 2016-02-26 NOTE — Patient Instructions (Signed)
Asked to increase daily walking for exercise: 3-5x of 2 minutes sustained walking at a brisk pace with rest breaks as needed.

## 2016-02-26 NOTE — Therapy (Addendum)
[facility] [address] [place], [place], [address] Phone: [phone]   Fax:  [phone]  Physical Therapy Evaluation  Patient Details  Name: [name] MRN: [id number] Date of Birth: [birth year] Referring Provider: Allyn Kenner   Encounter Date: 02/26/2016      PT End of Session - 02/26/16 1023    Visit Number 1   Number of Visits 12   Date for PT Re-Evaluation 03/27/16   Authorization Type Medicare   PT Start Time 0946   PT Stop Time 1020   PT Time Calculation (min) 34 min   Equipment Utilized During Treatment Oxygen   Activity Tolerance Patient tolerated treatment well;Patient limited by fatigue      Past Medical History  Diagnosis Date  . COPD (chronic obstructive pulmonary disease) ([place])     home O2  . Hypertension   . Hyperlipidemia   . History of tobacco abuse   . Mild depression   . Hypothyroidism   . Diastolic dysfunction   . History of nuclear stress test 09/23/2009    dipyridamole; normal pattern of perfusion; low risk scan   . CHF (congestive heart failure) [facility])     Past Surgical History  Procedure Laterality Date  . Gallbladder surgery  1977  . Total abdominal hysterectomy  1980's  . Cataract extraction w/phaco  09/08/2012    Procedure: CATARACT EXTRACTION PHACO AND INTRAOCULAR LENS PLACEMENT (IOC);  Surgeon: Tonny Branch, MD;  Location: [facility];  Service: Ophthalmology;  Laterality: Right;  CDE:19.67  . Tonsillectomy    . Right carpal tunnel release    . Cataract extraction w/phaco  09/19/2012    Procedure: CATARACT EXTRACTION PHACO AND INTRAOCULAR LENS PLACEMENT (IOC);  Surgeon: Tonny Branch, MD;  Location: [facility];  Service: Ophthalmology;  Laterality: Left;  CDE:19.83  . Transthoracic echocardiogram  09/23/2009    EF>55%; trace TR; normal LA & RA size; normal LV & RV size & systolic function   . Colonoscopy  2007    Dr. Gala Romney: internal hemorrhoids, benign polyps  . Esophagogastroduodenoscopy  2007    Dr.  Gala Romney: normal esophagus, non-critical Schatzki's ring not manipulated, normal small bowel biopsy  . Esophagogastroduodenoscopy (egd) with esophageal dilation N/A 10/16/2013    Dr. Fields:Schatzki's ring at the gastroesophageal junction s/p dilation/MODERATE Erosive gastritis. reactive gastropathy.     There were no vitals filed for this visit.       Subjective Assessment - 02/26/16 0953    Subjective Pt reports an episode of leg weakness which she attributes to her potassium being off. Since corrections she has felt back to herself. She reports she is at baseline today.    How long can you sit comfortably? No limits   How long can you stand comfortably? No limits   How long can you walk comfortably? No limits   Currently in Pain? No/denies            [facility] PT Assessment - 02/26/16 0001    Assessment   Medical Diagnosis Generalized Weakness   Referring Provider Allyn Kenner    Onset Date/Surgical Date 11/26/15   Next MD Visit 02/28/16   Balance Screen   Has the patient fallen in the past 6 months No   Has the patient had a decrease in activity level because of a fear of falling?  No   Is the patient reluctant to leave their home because of a fear of falling?  No   Prior Function   Level of  Independence Independent  3-3.5L/min at all times for several years.    Sit to Stand   Comments 5xSTS:  11.66s, hands free   Strength   Right Hip Flexion 4/5   Right Hip Extension 4-/5  In prone   Right Hip External Rotation  4/5  tested in prone   Right Hip Internal Rotation 4/5  tested in prone   Right Hip ABduction 4/5  Hor Abd at 90* (GMax): 5/5   Right Hip ADduction 5/5   Left Hip Flexion 4+/5   Left Hip Extension 4-/5  In prone   Left Hip External Rotation 4/5  tested in prone   Left Hip Internal Rotation 4/5  tested in prone   Left Hip ABduction 4/5  Hor Abd at 90* (GMax): 5/5   Left Hip ADduction 5/5   Right Knee Flexion 5/5   Right Knee Extension 5/5   Left Knee  Flexion 4+/5   Left Knee Extension 5/5   Right Ankle Dorsiflexion 5/5   Right Ankle Plantar Flexion 1/5  3 SLS heel raises (medial arch collapse c landing)   Left Ankle Dorsiflexion 5/5   Left Ankle Plantar Flexion 1/5  7 SLS heel raises.    Transfers   Five time sit to stand comments  Standard Chair, hands free  11.66s   Ambulation/Gait   Gait Comments 3 MWT  451ft, 4L; SaO2: 4L/min, 93% SaO2, HR: 98bpm    Static Standing Balance   Static Standing Balance -  Activities  Single Leg Stance - Right Leg;Single Leg Stance - Left Leg;Tandam Stance - Left Leg           Balance Exercises - 02/26/16 1223    Balance Exercises: Standing   Standing Eyes Closed --  Eval: Tandem R: 0s, L: 9s   Tandem Stance --  Eval: R: 30s, L: 30s   SLS Eyes open  Eval: R: 2s, L: 5s            PT Education - 02/26/16 1023    Education provided Yes   Education Details Explained th eimportance of the funcitonal tests in revealing her daily fncitonal capacity in IADL.    Person(s) Educated Patient   Methods Explanation   Comprehension Verbalized understanding          PT Short Term Goals - 02/26/16 1235    PT SHORT TERM GOAL #1   Title After 2 weeks pt will demonstrate indep in beginner HEP to improve self efficacy in therapy progress.    Status New   PT SHORT TERM GOAL #2   Title After 4 weeks, pt will demonstrate SLS balance >10sec bilat to imporved safety during ADL at home.   Status New   PT SHORT TERM GOAL #3   Title After 4 weeks patient will demonstrate ability to tolerate 6 minutes of sustained walking at speed of choice to improve safe access to the community for IADL.    Status New           PT Long Term Goals - 02/26/16 1236    PT LONG TERM GOAL #1   Title After 7 weeks pt will demonstrate indep in advanced HEP to improve self efficacy in continued progress s/p DC.    Status New   PT LONG TERM GOAL #2   Title After 7 weeks, pt will demonstrate SLS balance >30 sec bilat  to improve safety during IADL in the community.    Status New   PT LONG TERM GOAL #  3   Title After 7 weeks, pt will demonstrate ability to walk 1019ft during 6MWT to improve functional tolerance and maintain independence in IADL in community.    Status New           Plan - 02/26/16 1024    Clinical Impression Statement Eustacia Kubas is a 75yo white female who is previously indep in all ADL, IADL, and driving, very active and frequently accessing the community. Pt demonstrates mild to moderate impairment in balance, activity tolerance, strength, and DOE.  DOE worsens with AMB>60s on 4L and SaO2>92% more consistent with hypercapnia than hypoxia. Please read note for detail.    Rehab Potential Fair   Clinical Impairments Affecting Rehab Potential Chronic COPD/emphesema    PT Frequency 2x / week  Consider moving to 1x weekly after 4 weeks.    PT Duration 8 weeks   PT Treatment/Interventions Biofeedback;Moist Heat;Therapeutic exercise;Therapeutic activities;Functional mobility training;Stair training;Gait training;DME Instruction;Balance training;Patient/family education   PT Next Visit Plan Repeated gait training/trials with adequate rest between; Goals, HEP deveopment (lkely balance oriented)    PT Home Exercise Plan Gave some details for increasing AMB at home.    Consulted and Agree with Plan of Care Patient      Patient will benefit from skilled therapeutic intervention in order to improve the following deficits and impairments:  Abnormal gait, Difficulty walking, Cardiopulmonary status limiting activity, Decreased activity tolerance, Decreased balance, Decreased mobility, Decreased strength, Decreased endurance  Visit Diagnosis: Other abnormalities of gait and mobility - Plan: PT plan of care cert/re-cert  Unsteadiness on feet - Plan: PT plan of care cert/re-cert  Muscle weakness (generalized) - Plan: PT plan of care cert/re-cert     [id number]  PT G-Codes  Functional  Assessment Tool Used Clinical Judgment   Functional Limitation Mobility: Walking and moving around  Mobility: Walking and Moving Around Current Status [phone]) CI  Mobility: Walking and Moving Around Goal Status [phone]) CI    Problem List Patient Active Problem List   Diagnosis Date Noted  . Diarrhea 06/06/2015  . Abdominal pain 06/06/2015  . Acute on chronic respiratory failure ([place]) 10/04/2014  . COPD exacerbation ([place])   . Dysphagia   . Respiratory distress 09/30/2014  . Hypoxia 09/30/2014  . COPD with acute exacerbation ([place]) 09/30/2014  . Chronic respiratory failure with hypoxia ([place]) 02/02/2014  . Alopecia 02/02/2014  . Loss of weight 01/24/2014  . Unspecified constipation 01/24/2014  . Esophageal dysphagia 01/24/2014  . COPD Golds D, frequent exacerbations   . Hypertension   . Hyperlipidemia   . Hypothyroidism   . Mild depression     12:48 PM, 02/26/2016 Etta Grandchild, PT, DPT PRN Physical Therapist at [facility] # [id number] [phone] (office)     [facility] [address] [place], [place], [address] Phone: [phone]   Fax:  [phone]  Name: JAYLEAH LOVAGLIO MRN: [id number] Date of Birth: 06-Jun-1941    2:44 PM, 03/13/2016 Etta Grandchild, PT, DPT Physical Therapist at [facility] [phone] (office)    *addended to include G-Codes based on information from the evalution.

## 2016-02-28 DIAGNOSIS — F411 Generalized anxiety disorder: Secondary | ICD-10-CM | POA: Diagnosis not present

## 2016-02-28 DIAGNOSIS — R4181 Age-related cognitive decline: Secondary | ICD-10-CM | POA: Diagnosis not present

## 2016-03-03 ENCOUNTER — Ambulatory Visit (HOSPITAL_COMMUNITY): Payer: Medicare Other

## 2016-03-03 DIAGNOSIS — R2689 Other abnormalities of gait and mobility: Secondary | ICD-10-CM | POA: Diagnosis not present

## 2016-03-03 DIAGNOSIS — M6281 Muscle weakness (generalized): Secondary | ICD-10-CM

## 2016-03-03 DIAGNOSIS — R2681 Unsteadiness on feet: Secondary | ICD-10-CM

## 2016-03-03 NOTE — Therapy (Signed)
[facility] [address] [place], [place], [address] Phone: [phone]   Fax:  [phone]  Physical Therapy Treatment  Patient Details  Name: [name] MRN: [id number] Date of Birth: Mar 21, 1941 Referring Provider: Allyn Kenner   Encounter Date: 03/03/2016      PT End of Session - 03/03/16 1041    Visit Number 2   Number of Visits 12   Date for PT Re-Evaluation 03/27/16   Authorization Type Medicare   PT Start Time 1038   PT Stop Time 1120   PT Time Calculation (min) 42 min   Equipment Utilized During Treatment Oxygen;Gait belt   Activity Tolerance Patient tolerated treatment well;Patient limited by fatigue   Behavior During Therapy [facility] for tasks assessed/performed      Past Medical History  Diagnosis Date  . COPD (chronic obstructive pulmonary disease) ([place])     home O2  . Hypertension   . Hyperlipidemia   . History of tobacco abuse   . Mild depression   . Hypothyroidism   . Diastolic dysfunction   . History of nuclear stress test 09/23/2009    dipyridamole; normal pattern of perfusion; low risk scan   . CHF (congestive heart failure) [facility])     Past Surgical History  Procedure Laterality Date  . Gallbladder surgery  1977  . Total abdominal hysterectomy  1980's  . Cataract extraction w/phaco  09/08/2012    Procedure: CATARACT EXTRACTION PHACO AND INTRAOCULAR LENS PLACEMENT (IOC);  Surgeon: Tonny Branch, MD;  Location: [facility];  Service: Ophthalmology;  Laterality: Right;  CDE:19.67  . Tonsillectomy    . Right carpal tunnel release    . Cataract extraction w/phaco  09/19/2012    Procedure: CATARACT EXTRACTION PHACO AND INTRAOCULAR LENS PLACEMENT (IOC);  Surgeon: Tonny Branch, MD;  Location: [facility];  Service: Ophthalmology;  Laterality: Left;  CDE:19.83  . Transthoracic echocardiogram  09/23/2009    EF>55%; trace TR; normal LA & RA size; normal LV & RV size & systolic function   . Colonoscopy  2007    Dr. Gala Romney: internal  hemorrhoids, benign polyps  . Esophagogastroduodenoscopy  2007    Dr. Gala Romney: normal esophagus, non-critical Schatzki's ring not manipulated, normal small bowel biopsy  . Esophagogastroduodenoscopy (egd) with esophageal dilation N/A 10/16/2013    Dr. Fields:Schatzki's ring at the gastroesophageal junction s/p dilation/MODERATE Erosive gastritis. reactive gastropathy.     There were no vitals filed for this visit.      Subjective Assessment - 03/03/16 1040    Subjective Pt stated she has done a lot of cleaning at home this weekend.  Measured distance from carport to end of patio that is 50 feet and has completed 2 sets with her HEP.            [facility] Adult PT Treatment/Exercise - 03/03/16 0001    Ambulation/Gait   Gait Comments 2 sets this session:  Initially able to ambulate 257 ft in 1'38" with standing rest break 15" then 226 total of 3'13"; 2nd set able to AMB 226 in 1'13" prior fatigue   Exercises   Exercises Knee/Hip             Balance Exercises - 03/03/16 1119    Balance Exercises: Standing   Standing Eyes Opened Foam/compliant surface;3 reps;Narrow base of support (BOS)   Tandem Stance Eyes open;Foam/compliant surface;2 reps;30 secs  2 sets on static then 2 on airex   SLS Eyes open;3 reps  Rt 9", Lt 11" max  of 3             PT Short Term Goals - 02/26/16 1235    PT SHORT TERM GOAL #1   Title After 2 weeks pt will demonstrate indep in beginner HEP to improve self efficacy in therapy progress.    Status New   PT SHORT TERM GOAL #2   Title After 4 weeks, pt will demonstrate SLS balance >10sec bilat to imporved safety during ADL at home.   Status New   PT SHORT TERM GOAL #3   Title After 4 weeks patient will demonstrate ability to tolerate 6 minutes of sustained walking at speed of choice to improve safe access to the community for IADL.    Status New           PT Long Term Goals - 02/26/16 1236    PT LONG TERM GOAL #1   Title After 7 weeks pt will  demonstrate indep in advanced HEP to improve self efficacy in continued progress s/p DC.    Status New   PT LONG TERM GOAL #2   Title After 7 weeks, pt will demonstrate SLS balance >30 sec bilat to improve safety during IADL in the community.    Status New   PT LONG TERM GOAL #3   Title After 7 weeks, pt will demonstrate ability to walk 1039ft during 6MWT to improve functional tolerance and maintain independence in IADL in community.    Status New               Plan - 03/03/16 1217    Clinical Impression Statement Reviewed goals, compliance with walking program and copy of evaluation given to pt.  Session focus on improving activity tolerance with ambulation and balance with vitals (SpO2, HR) assessed through session.  SpO2 range from 90-97% with diaphragmatic breathing instructed to assist with shallow breathing and to increase SpO2.  EOS pt limited by fatigue with reports of fatigue level great than 5/10.   Clinical Impairments Affecting Rehab Potential Chronic COPD/emphesema    PT Frequency 2x / week  Consider reducing to 1x following 4 weeks   PT Duration 8 weeks   PT Treatment/Interventions Biofeedback;Moist Heat;Therapeutic exercise;Therapeutic activities;Functional mobility training;Stair training;Gait training;DME Instruction;Balance training;Patient/family education   PT Next Visit Plan Repeated gait training/trials with adequate rest between; Goals, HEP deveopment (lkely balance oriented)  Given balance HEP activiites at home.   PT Home Exercise Plan Reviewed compliance with walking program, add balance activities next session.      Patient will benefit from skilled therapeutic intervention in order to improve the following deficits and impairments:  Abnormal gait, Difficulty walking, Cardiopulmonary status limiting activity, Decreased activity tolerance, Decreased balance, Decreased mobility, Decreased strength, Decreased endurance  Visit Diagnosis: Other abnormalities of  gait and mobility  Unsteadiness on feet  Muscle weakness (generalized)     Problem List Patient Active Problem List   Diagnosis Date Noted  . Diarrhea 06/06/2015  . Abdominal pain 06/06/2015  . Acute on chronic respiratory failure ([place]) 10/04/2014  . COPD exacerbation ([place])   . Dysphagia   . Respiratory distress 09/30/2014  . Hypoxia 09/30/2014  . COPD with acute exacerbation ([place]) 09/30/2014  . Chronic respiratory failure with hypoxia ([place]) 02/02/2014  . Alopecia 02/02/2014  . Loss of weight 01/24/2014  . Unspecified constipation 01/24/2014  . Esophageal dysphagia 01/24/2014  . COPD Golds D, frequent exacerbations   . Hypertension   . Hyperlipidemia   . Hypothyroidism   . Mild depression  [address], LPTA; [place]  Aldona Lento 03/03/2016, 12:28 PM  [facility] [address] [place], [place], [address] Phone: [phone]   Fax:  [phone]  Name: [name] MRN: [id number] Date of Birth: Feb 05, 1941

## 2016-03-05 ENCOUNTER — Ambulatory Visit (HOSPITAL_COMMUNITY): Payer: Medicare Other | Admitting: Physical Therapy

## 2016-03-05 ENCOUNTER — Telehealth (HOSPITAL_COMMUNITY): Payer: Self-pay | Admitting: Physical Therapy

## 2016-03-05 DIAGNOSIS — R2689 Other abnormalities of gait and mobility: Secondary | ICD-10-CM | POA: Diagnosis not present

## 2016-03-05 DIAGNOSIS — M6281 Muscle weakness (generalized): Secondary | ICD-10-CM | POA: Diagnosis not present

## 2016-03-05 DIAGNOSIS — R2681 Unsteadiness on feet: Secondary | ICD-10-CM

## 2016-03-05 NOTE — Therapy (Signed)
[facility] [address] [place], [place], [address] Phone: [phone]   Fax:  [phone]  Physical Therapy Treatment  Patient Details  Name: [name] MRN: [id number] Date of Birth: [birth year] Referring Provider: Allyn Kenner   Encounter Date: 03/05/2016      PT End of Session - 03/05/16 1211    Visit Number 3   Number of Visits 12   Date for PT Re-Evaluation 03/27/16   Authorization Type Medicare   PT Start Time 1035   PT Stop Time 1115   PT Time Calculation (min) 40 min   Equipment Utilized During Treatment Oxygen;Gait belt   Activity Tolerance Patient tolerated treatment well;Patient limited by fatigue   Behavior During Therapy [facility] for tasks assessed/performed      Past Medical History  Diagnosis Date  . COPD (chronic obstructive pulmonary disease) ([place])     home O2  . Hypertension   . Hyperlipidemia   . History of tobacco abuse   . Mild depression   . Hypothyroidism   . Diastolic dysfunction   . History of nuclear stress test 09/23/2009    dipyridamole; normal pattern of perfusion; low risk scan   . CHF (congestive heart failure) [facility])     Past Surgical History  Procedure Laterality Date  . Gallbladder surgery  1977  . Total abdominal hysterectomy  1980's  . Cataract extraction w/phaco  09/08/2012    Procedure: CATARACT EXTRACTION PHACO AND INTRAOCULAR LENS PLACEMENT (IOC);  Surgeon: Tonny Branch, MD;  Location: [facility];  Service: Ophthalmology;  Laterality: Right;  CDE:19.67  . Tonsillectomy    . Right carpal tunnel release    . Cataract extraction w/phaco  09/19/2012    Procedure: CATARACT EXTRACTION PHACO AND INTRAOCULAR LENS PLACEMENT (IOC);  Surgeon: Tonny Branch, MD;  Location: [facility];  Service: Ophthalmology;  Laterality: Left;  CDE:19.83  . Transthoracic echocardiogram  09/23/2009    EF>55%; trace TR; normal LA & RA size; normal LV & RV size & systolic function   . Colonoscopy  2007    Dr. Gala Romney: internal  hemorrhoids, benign polyps  . Esophagogastroduodenoscopy  2007    Dr. Gala Romney: normal esophagus, non-critical Schatzki's ring not manipulated, normal small bowel biopsy  . Esophagogastroduodenoscopy (egd) with esophageal dilation N/A 10/16/2013    Dr. Fields:Schatzki's ring at the gastroesophageal junction s/p dilation/MODERATE Erosive gastritis. reactive gastropathy.     There were no vitals filed for this visit.      Subjective Assessment - 03/05/16 1036    Subjective Pt states she is doing ok this morning. She has been walking at home and working on her patio.   Currently in Pain? No/denies                         [facility] Adult PT Treatment/Exercise - 03/05/16 0001    Knee/Hip Exercises: Seated   Long Arc Quad 1 set;20 reps;Both   Long Arc Quad Weight 6 lbs.   Ball Squeeze 2x15   Clamshell with TheraBand Red  2x15   Other Seated Knee/Hip Exercises ankel PF with red TB x20 each   Sit to Sand without UE support;Other (comment)  x4 sets, 30 sec (10 reps, 8 reps, 9 reps, 9 reps) SaO2 94%             Balance Exercises - 03/05/16 1103    Balance Exercises: Standing   Standing Eyes Opened Foam/compliant surface;3 reps;Narrow base of support (BOS)  EC  Tandem Stance Eyes open;Foam/compliant surface;2 reps;20 secs  20 sec max each, increased ankle strategies noted   SLS Eyes open;2 reps  L: 5 sec max, R: 3 sec max while wearing O2 tank           PT Education - 03/05/16 1205    Education provided Yes   Education Details encouraged HEP adherence   Person(s) Educated Patient   Methods Explanation   Comprehension Verbalized understanding          PT Short Term Goals - 02/26/16 1235    PT SHORT TERM GOAL #1   Title After 2 weeks pt will demonstrate indep in beginner HEP to improve self efficacy in therapy progress.    Status New   PT SHORT TERM GOAL #2   Title After 4 weeks, pt will demonstrate SLS balance >10sec bilat to imporved safety during ADL at  home.   Status New   PT SHORT TERM GOAL #3   Title After 4 weeks patient will demonstrate ability to tolerate 6 minutes of sustained walking at speed of choice to improve safe access to the community for IADL.    Status New           PT Long Term Goals - 02/26/16 1236    PT LONG TERM GOAL #1   Title After 7 weeks pt will demonstrate indep in advanced HEP to improve self efficacy in continued progress s/p DC.    Status New   PT LONG TERM GOAL #2   Title After 7 weeks, pt will demonstrate SLS balance >30 sec bilat to improve safety during IADL in the community.    Status New   PT LONG TERM GOAL #3   Title After 7 weeks, pt will demonstrate ability to walk 1073ft during 6MWT to improve functional tolerance and maintain independence in IADL in community.    Status New               Plan - 03/05/16 1211    Clinical Impression Statement Today's session addressed LE strength and balance. Pt with reported fatigue, needing increased rest breaks, during balance activity performed at the end of her session. SaO2 was monitored throughout her session and remained above 92%. Will continue with current POC.   Clinical Impairments Affecting Rehab Potential Chronic COPD/emphesema    PT Frequency 2x / week  Consider reducing to 1x following 4 weeks   PT Duration 8 weeks   PT Treatment/Interventions Biofeedback;Moist Heat;Therapeutic exercise;Therapeutic activities;Functional mobility training;Stair training;Gait training;DME Instruction;Balance training;Patient/family education   PT Next Visit Plan Repeated gait training/trials with adequate rest between; HEP deveopment (lkely balance oriented); LE strengthening progressed as she is able to tolerate increased reps/resistance   PT Home Exercise Plan addition of balance activity next session   Consulted and Agree with Plan of Care Patient      Patient will benefit from skilled therapeutic intervention in order to improve the following  deficits and impairments:  Abnormal gait, Difficulty walking, Cardiopulmonary status limiting activity, Decreased activity tolerance, Decreased balance, Decreased mobility, Decreased strength, Decreased endurance  Visit Diagnosis: Other abnormalities of gait and mobility  Unsteadiness on feet  Muscle weakness (generalized)     Problem List Patient Active Problem List   Diagnosis Date Noted  . Diarrhea 06/06/2015  . Abdominal pain 06/06/2015  . Acute on chronic respiratory failure ([place]) 10/04/2014  . COPD exacerbation ([place])   . Dysphagia   . Respiratory distress 09/30/2014  . Hypoxia 09/30/2014  . COPD with  acute exacerbation ([place]) 09/30/2014  . Chronic respiratory failure with hypoxia ([place]) 02/02/2014  . Alopecia 02/02/2014  . Loss of weight 01/24/2014  . Unspecified constipation 01/24/2014  . Esophageal dysphagia 01/24/2014  . COPD Golds D, frequent exacerbations   . Hypertension   . Hyperlipidemia   . Hypothyroidism   . Mild depression     12:18 PM,03/05/2016 Elly Modena PT, DPT [facility] [address] [place], [place], [address] Phone: [phone]   Fax:  [phone]  Name: [name] MRN: [id number] Date of Birth: Mar 27, 1941

## 2016-03-05 NOTE — Telephone Encounter (Signed)
Pt did not show for appt.  Called and left message reminding of NS policy and next appt on 7/5 at [place], PTA/CLT [phone]

## 2016-03-11 ENCOUNTER — Ambulatory Visit (HOSPITAL_COMMUNITY): Payer: Medicare Other | Attending: Internal Medicine | Admitting: Physical Therapy

## 2016-03-11 DIAGNOSIS — R2689 Other abnormalities of gait and mobility: Secondary | ICD-10-CM

## 2016-03-11 DIAGNOSIS — M6281 Muscle weakness (generalized): Secondary | ICD-10-CM | POA: Diagnosis not present

## 2016-03-11 DIAGNOSIS — R2681 Unsteadiness on feet: Secondary | ICD-10-CM | POA: Diagnosis not present

## 2016-03-11 NOTE — Therapy (Signed)
[facility] [address] [place], [place], [address] Phone: [phone]   Fax:  [phone]  Physical Therapy Treatment  Patient Details  Name: [name] MRN: [id number] Date of Birth: 05-Sep-1941 Referring Provider: Allyn Kenner   Encounter Date: 03/11/2016      PT End of Session - 03/11/16 1002    Visit Number 4   Number of Visits 12   Date for PT Re-Evaluation 03/27/16   Authorization Type Medicare   PT Start Time 0952   PT Stop Time 1030   PT Time Calculation (min) 38 min   Equipment Utilized During Treatment Oxygen;Gait belt   Activity Tolerance Patient tolerated treatment well;Patient limited by fatigue   Behavior During Therapy [facility] for tasks assessed/performed      Past Medical History  Diagnosis Date  . COPD (chronic obstructive pulmonary disease) ([place])     home O2  . Hypertension   . Hyperlipidemia   . History of tobacco abuse   . Mild depression   . Hypothyroidism   . Diastolic dysfunction   . History of nuclear stress test 09/23/2009    dipyridamole; normal pattern of perfusion; low risk scan   . CHF (congestive heart failure) [facility])     Past Surgical History  Procedure Laterality Date  . Gallbladder surgery  1977  . Total abdominal hysterectomy  1980's  . Cataract extraction w/phaco  09/08/2012    Procedure: CATARACT EXTRACTION PHACO AND INTRAOCULAR LENS PLACEMENT (IOC);  Surgeon: Tonny Branch, MD;  Location: [facility];  Service: Ophthalmology;  Laterality: Right;  CDE:19.67  . Tonsillectomy    . Right carpal tunnel release    . Cataract extraction w/phaco  09/19/2012    Procedure: CATARACT EXTRACTION PHACO AND INTRAOCULAR LENS PLACEMENT (IOC);  Surgeon: Tonny Branch, MD;  Location: [facility];  Service: Ophthalmology;  Laterality: Left;  CDE:19.83  . Transthoracic echocardiogram  09/23/2009    EF>55%; trace TR; normal LA & RA size; normal LV & RV size & systolic function   . Colonoscopy  2007    Dr. Gala Romney: internal  hemorrhoids, benign polyps  . Esophagogastroduodenoscopy  2007    Dr. Gala Romney: normal esophagus, non-critical Schatzki's ring not manipulated, normal small bowel biopsy  . Esophagogastroduodenoscopy (egd) with esophageal dilation N/A 10/16/2013    Dr. Fields:Schatzki's ring at the gastroesophageal junction s/p dilation/MODERATE Erosive gastritis. reactive gastropathy.     There were no vitals filed for this visit.      Subjective Assessment - 03/11/16 0953    Subjective Pt states she is doing good this morning, but she hasn't been doing her exercises regularly. She has been staying busy though.   Currently in Pain? No/denies                         [facility] Adult PT Treatment/Exercise - 03/11/16 0001    Knee/Hip Exercises: Standing   Heel Raises Both;1 set;20 reps  BUE support    Knee/Hip Exercises: Supine   Bridges Limitations 2x15 reps   Knee/Hip Exercises: Sidelying   Clams 2x10 each with red TB             Balance Exercises - 03/11/16 1018    Balance Exercises: Standing   Standing Eyes Opened Narrow base of support (BOS);Foam/compliant surface  UE reach across midline x10 each.   Tandem Stance Eyes open;2 reps;Intermittent upper extremity support;20 secs  R: 5-20 sec, L: 20-25 sec   SLS Eyes open;Intermittent upper  extremity support;2 reps  L: 4-10 sec, R: 7-10 sec   Other Standing Exercises alt marching and LE taps x20, 6" step           PT Education - 03/11/16 0957    Education provided Yes   Education Details encouraged HEP adherence and purpose for performing specific therex from [place] to get the most benefit; updated HEP   Person(s) Educated Patient   Methods Explanation;Handout;Demonstration   Comprehension Verbalized understanding;Returned demonstration          PT Short Term Goals - 02/26/16 1235    PT SHORT TERM GOAL #1   Title After 2 weeks pt will demonstrate indep in beginner HEP to improve self efficacy in therapy progress.     Status New   PT SHORT TERM GOAL #2   Title After 4 weeks, pt will demonstrate SLS balance >10sec bilat to imporved safety during ADL at home.   Status New   PT SHORT TERM GOAL #3   Title After 4 weeks patient will demonstrate ability to tolerate 6 minutes of sustained walking at speed of choice to improve safe access to the community for IADL.    Status New           PT Long Term Goals - 02/26/16 1236    PT LONG TERM GOAL #1   Title After 7 weeks pt will demonstrate indep in advanced HEP to improve self efficacy in continued progress s/p DC.    Status New   PT LONG TERM GOAL #2   Title After 7 weeks, pt will demonstrate SLS balance >30 sec bilat to improve safety during IADL in the community.    Status New   PT LONG TERM GOAL #3   Title After 7 weeks, pt will demonstrate ability to walk 1030ft during 6MWT to improve functional tolerance and maintain independence in IADL in community.    Status New               Plan - 03/11/16 1010    Clinical Impression Statement Today's session continued focus on LE strength and balance activity. Pt's SaO2 was monitored throughout the session and was noted at 94%-98% saturation. Updated HEP and discussed importance of adherence to specific therex provided by therapist in addition to other daily activity for optimal improvement. Pt verbalizing good understanding at this time.   Clinical Impairments Affecting Rehab Potential Chronic COPD/emphesema    PT Frequency 2x / week  Consider reducing to 1x following 4 weeks   PT Duration 8 weeks   PT Treatment/Interventions Biofeedback;Moist Heat;Therapeutic exercise;Therapeutic activities;Functional mobility training;Stair training;Gait training;DME Instruction;Balance training;Patient/family education   PT Next Visit Plan Repeated gait training/trials with adequate rest between; HEP deveopment (lkely balance oriented); LE strengthening progressed as she is able to tolerate increased reps/resistance    PT Home Exercise Plan addition of balance activity next session   Consulted and Agree with Plan of Care Patient      Patient will benefit from skilled therapeutic intervention in order to improve the following deficits and impairments:  Abnormal gait, Difficulty walking, Cardiopulmonary status limiting activity, Decreased activity tolerance, Decreased balance, Decreased mobility, Decreased strength, Decreased endurance  Visit Diagnosis: Other abnormalities of gait and mobility  Unsteadiness on feet  Muscle weakness (generalized)     Problem List Patient Active Problem List   Diagnosis Date Noted  . Diarrhea 06/06/2015  . Abdominal pain 06/06/2015  . Acute on chronic respiratory failure ([place]) 10/04/2014  . COPD exacerbation ([place])   .  Dysphagia   . Respiratory distress 09/30/2014  . Hypoxia 09/30/2014  . COPD with acute exacerbation ([place]) 09/30/2014  . Chronic respiratory failure with hypoxia ([place]) 02/02/2014  . Alopecia 02/02/2014  . Loss of weight 01/24/2014  . Unspecified constipation 01/24/2014  . Esophageal dysphagia 01/24/2014  . COPD Golds D, frequent exacerbations   . Hypertension   . Hyperlipidemia   . Hypothyroidism   . Mild depression     12:45 PM,03/11/2016 Elly Modena PT, DPT [facility] [address] [place], [place], [address] Phone: [phone]   Fax:  [phone]  Name: [name] MRN: [id number] Date of Birth: 1940-10-24

## 2016-03-13 ENCOUNTER — Ambulatory Visit (HOSPITAL_COMMUNITY): Payer: Medicare Other

## 2016-03-13 DIAGNOSIS — M6281 Muscle weakness (generalized): Secondary | ICD-10-CM

## 2016-03-13 DIAGNOSIS — R2689 Other abnormalities of gait and mobility: Secondary | ICD-10-CM | POA: Diagnosis not present

## 2016-03-13 DIAGNOSIS — R2681 Unsteadiness on feet: Secondary | ICD-10-CM

## 2016-03-13 NOTE — Therapy (Signed)
[name] [address] [place], [place], [address] Phone: [phone]   Fax:  [phone]  Physical Therapy Treatment  Patient Details  Name: [name] MRN: [id number] Date of Birth: 1940-10-11 Referring Provider: Allyn Kenner   Encounter Date: 03/13/2016      PT End of Session - 03/13/16 1324    Visit Number 5   Number of Visits 12   Date for PT Re-Evaluation 03/27/16   Authorization Type Medicare   PT Start Time 1303   PT Stop Time 1341   PT Time Calculation (min) 38 min   Equipment Utilized During Treatment Oxygen   Activity Tolerance Patient tolerated treatment well;Patient limited by fatigue   Behavior During Therapy [facility] for tasks assessed/performed      Past Medical History  Diagnosis Date  . COPD (chronic obstructive pulmonary disease) ([place])     home O2  . Hypertension   . Hyperlipidemia   . History of tobacco abuse   . Mild depression   . Hypothyroidism   . Diastolic dysfunction   . History of nuclear stress test 09/23/2009    dipyridamole; normal pattern of perfusion; low risk scan   . CHF (congestive heart failure) [facility])     Past Surgical History  Procedure Laterality Date  . Gallbladder surgery  1977  . Total abdominal hysterectomy  1980's  . Cataract extraction w/phaco  09/08/2012    Procedure: CATARACT EXTRACTION PHACO AND INTRAOCULAR LENS PLACEMENT (IOC);  Surgeon: Tonny Branch, MD;  Location: [facility];  Service: Ophthalmology;  Laterality: Right;  CDE:19.67  . Tonsillectomy    . Right carpal tunnel release    . Cataract extraction w/phaco  09/19/2012    Procedure: CATARACT EXTRACTION PHACO AND INTRAOCULAR LENS PLACEMENT (IOC);  Surgeon: Tonny Branch, MD;  Location: [facility];  Service: Ophthalmology;  Laterality: Left;  CDE:19.83  . Transthoracic echocardiogram  09/23/2009    EF>55%; trace TR; normal LA & RA size; normal LV & RV size & systolic function   . Colonoscopy  2007    Dr. Gala Romney: internal hemorrhoids,  benign polyps  . Esophagogastroduodenoscopy  2007    Dr. Gala Romney: normal esophagus, non-critical Schatzki's ring not manipulated, normal small bowel biopsy  . Esophagogastroduodenoscopy (egd) with esophageal dilation N/A 10/16/2013    Dr. Fields:Schatzki's ring at the gastroesophageal junction s/p dilation/MODERATE Erosive gastritis. reactive gastropathy.     There were no vitals filed for this visit.      Subjective Assessment - 03/13/16 1309    Subjective Pt reports she is doing well. HEP is going well, although she has better compliance with the supine activities. Pt has been tryng to walk more at the mall, and even tries to go up her stairs at home at least QD.                          [facility] Adult PT Treatment/Exercise - 03/13/16 0001    Ambulation/Gait   Gait Comments 4x225, intermittent throughotu session  avg 1.75m/s   Knee/Hip Exercises: Standing   Heel Raises Both;20 reps;3 sets  BUE support PRN 3x15   Heel Raises Limitations toe raises starting in PF  narrow stance 3x10 bilat   Other Standing Knee Exercises SLSL Hip hikeson 2" step  3x8 bilat             Balance Exercises - 03/13/16 1312    Balance Exercises: Standing   Tandem Stance 5 reps;10 secs  slightly improvement with repeats, Left side worse.    SLS Eyes open;Solid surface  10x5 seconds alteranting. (broken into 2 sets)             PT Short Term Goals - 02/26/16 1235    PT SHORT TERM GOAL #1   Title After 2 weeks pt will demonstrate indep in beginner HEP to improve self efficacy in therapy progress.    Status New   PT SHORT TERM GOAL #2   Title After 4 weeks, pt will demonstrate SLS balance >10sec bilat to imporved safety during ADL at home.   Status New   PT SHORT TERM GOAL #3   Title After 4 weeks patient will demonstrate ability to tolerate 6 minutes of sustained walking at speed of choice to improve safe access to the community for IADL.    Status New           PT Long  Term Goals - 02/26/16 1236    PT LONG TERM GOAL #1   Title After 7 weeks pt will demonstrate indep in advanced HEP to improve self efficacy in continued progress s/p DC.    Status New   PT LONG TERM GOAL #2   Title After 7 weeks, pt will demonstrate SLS balance >30 sec bilat to improve safety during IADL in the community.    Status New   PT LONG TERM GOAL #3   Title After 7 weeks, pt will demonstrate ability to walk 1070ft during 6MWT to improve functional tolerance and maintain independence in IADL in community.    Status New               Plan - 03/13/16 1325    Clinical Impression Statement Pt tolerating session well today, intermittent DOE with prolonged exersion and some knee pain with prolonged AMB. Pt making progress toward many goals, but progress with balance continues to be very limited, mostly due to inadequate strength in hips and ankles, hence today's session put a greater emphasis on isolated strengthening of these muscles. POC to continue as at evaluted.  Pt continues to requires heavy verbal cues and assistance with exercises even with multiple sets, with limited short term memory carry over when repeated.  Gait intervals also a big focus this session, which are tolerated well with rest between.    Rehab Potential Fair   Clinical Impairments Affecting Rehab Potential Chronic COPD/emphesema    PT Frequency 2x / week   PT Duration 8 weeks   PT Treatment/Interventions Biofeedback;Moist Heat;Therapeutic exercise;Therapeutic activities;Functional mobility training;Stair training;Gait training;DME Instruction;Balance training;Patient/family education   PT Next Visit Plan progress gait training/trials with adequate rest between;  continue with ankle strength and hip hiking.    Consulted and Agree with Plan of Care Patient      Patient will benefit from skilled therapeutic intervention in order to improve the following deficits and impairments:  Abnormal gait, Difficulty  walking, Cardiopulmonary status limiting activity, Decreased activity tolerance, Decreased balance, Decreased mobility, Decreased strength, Decreased endurance  Visit Diagnosis: Other abnormalities of gait and mobility  Unsteadiness on feet  Muscle weakness (generalized)     Problem List Patient Active Problem List   Diagnosis Date Noted  . Diarrhea 06/06/2015  . Abdominal pain 06/06/2015  . Acute on chronic respiratory failure ([place]) 10/04/2014  . COPD exacerbation ([place])   . Dysphagia   . Respiratory distress 09/30/2014  . Hypoxia 09/30/2014  . COPD with acute exacerbation ([place]) 09/30/2014  . Chronic respiratory failure with hypoxia ([facility])  02/02/2014  . Alopecia 02/02/2014  . Loss of weight 01/24/2014  . Unspecified constipation 01/24/2014  . Esophageal dysphagia 01/24/2014  . COPD Golds D, frequent exacerbations   . Hypertension   . Hyperlipidemia   . Hypothyroidism   . Mild depression     1:40 PM, 03/13/2016 Etta Grandchild, PT, DPT Physical Therapist at [facility] [phone] (office)     [facility] [address] [place], [place], [address] Phone: [phone]   Fax:  [phone]  Name: CASSAUNDRA PASQUINELLI MRN: [id number] Date of Birth: December 02, 1940

## 2016-03-16 DIAGNOSIS — L659 Nonscarring hair loss, unspecified: Secondary | ICD-10-CM | POA: Diagnosis not present

## 2016-03-16 DIAGNOSIS — R42 Dizziness and giddiness: Secondary | ICD-10-CM | POA: Diagnosis not present

## 2016-03-16 DIAGNOSIS — R4181 Age-related cognitive decline: Secondary | ICD-10-CM | POA: Diagnosis not present

## 2016-03-16 DIAGNOSIS — R5383 Other fatigue: Secondary | ICD-10-CM | POA: Diagnosis not present

## 2016-03-16 DIAGNOSIS — E559 Vitamin D deficiency, unspecified: Secondary | ICD-10-CM | POA: Diagnosis not present

## 2016-03-17 ENCOUNTER — Ambulatory Visit (HOSPITAL_COMMUNITY): Payer: Medicare Other

## 2016-03-17 DIAGNOSIS — M6281 Muscle weakness (generalized): Secondary | ICD-10-CM | POA: Diagnosis not present

## 2016-03-17 DIAGNOSIS — R2689 Other abnormalities of gait and mobility: Secondary | ICD-10-CM

## 2016-03-17 DIAGNOSIS — R2681 Unsteadiness on feet: Secondary | ICD-10-CM

## 2016-03-17 NOTE — Therapy (Signed)
[facility] [address] [place], [place], [address] Phone: [phone]   Fax:  [phone]  Physical Therapy Treatment  Patient Details  Name: [name] MRN: [id number] Date of Birth: 09/09/1940 Referring Provider: Allyn Kenner   Encounter Date: 03/17/2016      PT End of Session - 03/17/16 1041    Visit Number 6   Number of Visits 12   Date for PT Re-Evaluation 03/27/16   Authorization Type Medicare   PT Start Time 1034   PT Stop Time 1115   PT Time Calculation (min) 41 min   Equipment Utilized During Treatment Oxygen   Activity Tolerance Patient tolerated treatment well;Patient limited by fatigue   Behavior During Therapy [facility] for tasks assessed/performed      Past Medical History  Diagnosis Date  . COPD (chronic obstructive pulmonary disease) ([place])     home O2  . Hypertension   . Hyperlipidemia   . History of tobacco abuse   . Mild depression   . Hypothyroidism   . Diastolic dysfunction   . History of nuclear stress test 09/23/2009    dipyridamole; normal pattern of perfusion; low risk scan   . CHF (congestive heart failure) [facility])     Past Surgical History  Procedure Laterality Date  . Gallbladder surgery  1977  . Total abdominal hysterectomy  1980's  . Cataract extraction w/phaco  09/08/2012    Procedure: CATARACT EXTRACTION PHACO AND INTRAOCULAR LENS PLACEMENT (IOC);  Surgeon: Tonny Branch, MD;  Location: [facility];  Service: Ophthalmology;  Laterality: Right;  CDE:19.67  . Tonsillectomy    . Right carpal tunnel release    . Cataract extraction w/phaco  09/19/2012    Procedure: CATARACT EXTRACTION PHACO AND INTRAOCULAR LENS PLACEMENT (IOC);  Surgeon: Tonny Branch, MD;  Location: [facility];  Service: Ophthalmology;  Laterality: Left;  CDE:19.83  . Transthoracic echocardiogram  09/23/2009    EF>55%; trace TR; normal LA & RA size; normal LV & RV size & systolic function   . Colonoscopy  2007    Dr. Gala Romney: internal hemorrhoids,  benign polyps  . Esophagogastroduodenoscopy  2007    Dr. Gala Romney: normal esophagus, non-critical Schatzki's ring not manipulated, normal small bowel biopsy  . Esophagogastroduodenoscopy (egd) with esophageal dilation N/A 10/16/2013    Dr. Fields:Schatzki's ring at the gastroesophageal junction s/p dilation/MODERATE Erosive gastritis. reactive gastropathy.     There were no vitals filed for this visit.      Subjective Assessment - 03/17/16 1036    Subjective Pt stated she isn't feeling that well today, no reports of real pain today.  Pt feels her balance needs to improve, reports increased dizziness this morning   Currently in Pain? No/denies           [facility] Adult PT Treatment/Exercise - 03/17/16 0001    Ambulation/Gait   Gait Comments 2x 452 with SpO2 assessed    Knee/Hip Exercises: Standing   Heel Raises Both;20 reps;3 sets  NBOS no UE A   Heel Raises Limitations toe raises starting in PF             Balance Exercises - 03/17/16 1055    Balance Exercises: Standing   Tandem Stance Eyes open;Foam/compliant surface;3 reps;30 secs   SLS Eyes open;Solid surface;3 reps;Time   Rockerboard Lateral;UE support  2 min   Tandem Gait 2 reps             PT Short Term Goals - 02/26/16 1235  PT SHORT TERM GOAL #1   Title After 2 weeks pt will demonstrate indep in beginner HEP to improve self efficacy in therapy progress.    Status New   PT SHORT TERM GOAL #2   Title After 4 weeks, pt will demonstrate SLS balance >10sec bilat to imporved safety during ADL at home.   Status New   PT SHORT TERM GOAL #3   Title After 4 weeks patient will demonstrate ability to tolerate 6 minutes of sustained walking at speed of choice to improve safe access to the community for IADL.    Status New           PT Long Term Goals - 02/26/16 1236    PT LONG TERM GOAL #1   Title After 7 weeks pt will demonstrate indep in advanced HEP to improve self efficacy in continued progress s/p DC.     Status New   PT LONG TERM GOAL #2   Title After 7 weeks, pt will demonstrate SLS balance >30 sec bilat to improve safety during IADL in the community.    Status New   PT LONG TERM GOAL #3   Title After 7 weeks, pt will demonstrate ability to walk 109ft during 6MWT to improve functional tolerance and maintain independence in IADL in community.    Status New               Plan - 03/17/16 1052    Clinical Impression Statement Pt stated she wasn't feel the best initially this session, vitals taken with SpO2 at 97%, HR at 85bmp and BP 118/75 mmHg with SpO2 assessed through session with SpO2 range from 89-93% through session.  Session focus on improving activity tolerance with gait training, strengthenig and balance training.  Pt making gains with activity tolerance with ability to ambulate 452 ft in 2\' 37"  with SpO2 at 89% and seated rest breaks taken through session due to fatigue.  Min A required for balance activities through session.     Rehab Potential Fair   Clinical Impairments Affecting Rehab Potential Chronic COPD/emphesema    PT Frequency 2x / week   PT Duration 8 weeks   PT Treatment/Interventions Biofeedback;Moist Heat;Therapeutic exercise;Therapeutic activities;Functional mobility training;Stair training;Gait training;DME Instruction;Balance training;Patient/family education   PT Next Visit Plan progress gait training/trials with adequate rest between;  continue with ankle strength and hip hiking.       Patient will benefit from skilled therapeutic intervention in order to improve the following deficits and impairments:  Abnormal gait, Difficulty walking, Cardiopulmonary status limiting activity, Decreased activity tolerance, Decreased balance, Decreased mobility, Decreased strength, Decreased endurance  Visit Diagnosis: Unsteadiness on feet  Muscle weakness (generalized)  Other abnormalities of gait and mobility     Problem List Patient Active Problem List    Diagnosis Date Noted  . Diarrhea 06/06/2015  . Abdominal pain 06/06/2015  . Acute on chronic respiratory failure ([place]) 10/04/2014  . COPD exacerbation ([place])   . Dysphagia   . Respiratory distress 09/30/2014  . Hypoxia 09/30/2014  . COPD with acute exacerbation ([place]) 09/30/2014  . Chronic respiratory failure with hypoxia ([place]) 02/02/2014  . Alopecia 02/02/2014  . Loss of weight 01/24/2014  . Unspecified constipation 01/24/2014  . Esophageal dysphagia 01/24/2014  . COPD Golds D, frequent exacerbations   . Hypertension   . Hyperlipidemia   . Hypothyroidism   . Mild depression    Ihor Austin, [place]; [place]  Aldona Lento 03/17/2016, 12:31 PM  [facility]  [place], [place], [address] Phone: [phone]   Fax:  [phone]  Name: [name] MRN: [id number] Date of Birth: [birth year]

## 2016-03-19 ENCOUNTER — Ambulatory Visit (HOSPITAL_COMMUNITY): Payer: Medicare Other | Admitting: Physical Therapy

## 2016-03-19 DIAGNOSIS — R2681 Unsteadiness on feet: Secondary | ICD-10-CM | POA: Diagnosis not present

## 2016-03-19 DIAGNOSIS — R2689 Other abnormalities of gait and mobility: Secondary | ICD-10-CM | POA: Diagnosis not present

## 2016-03-19 DIAGNOSIS — M6281 Muscle weakness (generalized): Secondary | ICD-10-CM | POA: Diagnosis not present

## 2016-03-19 NOTE — Therapy (Signed)
[place] [address] [place], [place], [address] Phone: [phone]   Fax:  [phone]  Physical Therapy Treatment  Patient Details  Name: [name] MRN: [id number] Date of Birth: 06-04-1941 Referring Provider: Allyn Kenner   Encounter Date: 03/19/2016      PT End of Session - 03/19/16 1127    Visit Number 7   Number of Visits 12   Date for PT Re-Evaluation 03/27/16   Authorization Type Medicare   PT Start Time 1036   PT Stop Time 1115   PT Time Calculation (min) 39 min   Equipment Utilized During Treatment Oxygen   Activity Tolerance Patient tolerated treatment well;Patient limited by fatigue   Behavior During Therapy [facility] for tasks assessed/performed      Past Medical History  Diagnosis Date  . COPD (chronic obstructive pulmonary disease) ([place])     home O2  . Hypertension   . Hyperlipidemia   . History of tobacco abuse   . Mild depression   . Hypothyroidism   . Diastolic dysfunction   . History of nuclear stress test 09/23/2009    dipyridamole; normal pattern of perfusion; low risk scan   . CHF (congestive heart failure) [facility])     Past Surgical History  Procedure Laterality Date  . Gallbladder surgery  1977  . Total abdominal hysterectomy  1980's  . Cataract extraction w/phaco  09/08/2012    Procedure: CATARACT EXTRACTION PHACO AND INTRAOCULAR LENS PLACEMENT (IOC);  Surgeon: Tonny Branch, MD;  Location: [facility];  Service: Ophthalmology;  Laterality: Right;  CDE:19.67  . Tonsillectomy    . Right carpal tunnel release    . Cataract extraction w/phaco  09/19/2012    Procedure: CATARACT EXTRACTION PHACO AND INTRAOCULAR LENS PLACEMENT (IOC);  Surgeon: Tonny Branch, MD;  Location: [facility];  Service: Ophthalmology;  Laterality: Left;  CDE:19.83  . Transthoracic echocardiogram  09/23/2009    EF>55%; trace TR; normal LA & RA size; normal LV & RV size & systolic function   . Colonoscopy  2007    Dr. Gala Romney: internal hemorrhoids,  benign polyps  . Esophagogastroduodenoscopy  2007    Dr. Gala Romney: normal esophagus, non-critical Schatzki's ring not manipulated, normal small bowel biopsy  . Esophagogastroduodenoscopy (egd) with esophageal dilation N/A 10/16/2013    Dr. Fields:Schatzki's ring at the gastroesophageal junction s/p dilation/MODERATE Erosive gastritis. reactive gastropathy.     There were no vitals filed for this visit.      Subjective Assessment - 03/19/16 1040    Subjective Patient arrives today feeling a little under the weather, states that she feels like she just has a bug and the heat makes it hard for her to breath. She states taht her dizziness is OK today but now and then she'll get dizzy right after she stands up for a couple of seconds.    Currently in Pain? No/denies                         [facility] Adult PT Treatment/Exercise - 03/19/16 0001    Knee/Hip Exercises: Standing   Heel Raises Both;1 set;20 reps   Heel Raises Limitations toe and heel raises    Lateral Step Up Both;1 set;10 reps   Lateral Step Up Limitations 4 inch box    Forward Step Up Both;1 set;10 reps   Forward Step Up Limitations 4 inch box    Other Standing Knee Exercises sit to stand no UEs x10  Gait multiple laps: 2 minutes, 23 seconds; 2 minutes, 48 seconds; 1 minute, 20 seconds with O2 and tolerance monitoring        Balance Exercises - 03/19/16 1045    Balance Exercises: Standing   Standing Eyes Closed Narrow base of support (BOS);Foam/compliant surface;3 reps;20 secs   Tandem Stance Eyes open;15 secs;1 rep  reps limited due to accessory breathing pattern            PT Education - 03/19/16 1127    Education provided No          PT Short Term Goals - 02/26/16 1235    PT SHORT TERM GOAL #1   Title After 2 weeks pt will demonstrate indep in beginner HEP to improve self efficacy in therapy progress.    Status New   PT SHORT TERM GOAL #2   Title After 4 weeks, pt will demonstrate SLS  balance >10sec bilat to imporved safety during ADL at home.   Status New   PT SHORT TERM GOAL #3   Title After 4 weeks patient will demonstrate ability to tolerate 6 minutes of sustained walking at speed of choice to improve safe access to the community for IADL.    Status New           PT Long Term Goals - 02/26/16 1236    PT LONG TERM GOAL #1   Title After 7 weeks pt will demonstrate indep in advanced HEP to improve self efficacy in continued progress s/p DC.    Status New   PT LONG TERM GOAL #2   Title After 7 weeks, pt will demonstrate SLS balance >30 sec bilat to improve safety during IADL in the community.    Status New   PT LONG TERM GOAL #3   Title After 7 weeks, pt will demonstrate ability to walk 1075ft during 6MWT to improve functional tolerance and maintain independence in IADL in community.    Status New               Plan - 03/19/16 1128    Clinical Impression Statement Patient arrives today reporting she is still feeling a bit off, she feels it was a stomach bug but is otherwise doing OK. Focused on balance, strengthening, and functional activity tolerance today with rest breaks provided PRN due to fatigue and DOE today. Patient did express interest in local walking club and provided best contact information to be added to club contacts at this time. Patient does display increased accessory muscle activation as fatigue increases, however O2 measures on 4LPM via [place] did not fall below 91% today.    Rehab Potential Fair   Clinical Impairments Affecting Rehab Potential Chronic COPD/emphesema    PT Frequency 2x / week   PT Duration 8 weeks   PT Treatment/Interventions Biofeedback;Moist Heat;Therapeutic exercise;Therapeutic activities;Functional mobility training;Stair training;Gait training;DME Instruction;Balance training;Patient/family education   PT Next Visit Plan progress gait training/trials with adequate rest between;  continue with general functional strength  and hip hiking, balance training    Consulted and Agree with Plan of Care Patient      Patient will benefit from skilled therapeutic intervention in order to improve the following deficits and impairments:  Abnormal gait, Difficulty walking, Cardiopulmonary status limiting activity, Decreased activity tolerance, Decreased balance, Decreased mobility, Decreased strength, Decreased endurance  Visit Diagnosis: Unsteadiness on feet  Muscle weakness (generalized)  Other abnormalities of gait and mobility     Problem List Patient Active Problem List   Diagnosis Date  Noted  . Diarrhea 06/06/2015  . Abdominal pain 06/06/2015  . Acute on chronic respiratory failure ([place]) 10/04/2014  . COPD exacerbation ([place])   . Dysphagia   . Respiratory distress 09/30/2014  . Hypoxia 09/30/2014  . COPD with acute exacerbation ([place]) 09/30/2014  . Chronic respiratory failure with hypoxia ([place]) 02/02/2014  . Alopecia 02/02/2014  . Loss of weight 01/24/2014  . Unspecified constipation 01/24/2014  . Esophageal dysphagia 01/24/2014  . COPD Golds D, frequent exacerbations   . Hypertension   . Hyperlipidemia   . Hypothyroidism   . Mild depression     Deniece Ree PT, DPT [place] [facility] [address] [place], [place], [address] Phone: [phone]   Fax:  [phone]  Name: [name] MRN: [id number] Date of Birth: 05/25/1941

## 2016-03-23 ENCOUNTER — Ambulatory Visit (HOSPITAL_COMMUNITY): Payer: Medicare Other | Admitting: Physical Therapy

## 2016-03-23 ENCOUNTER — Telehealth (HOSPITAL_COMMUNITY): Payer: Self-pay | Admitting: Physical Therapy

## 2016-03-23 NOTE — Telephone Encounter (Signed)
She has water problems and can not come in today.

## 2016-03-25 ENCOUNTER — Emergency Department (HOSPITAL_COMMUNITY)
Admission: EM | Admit: 2016-03-25 | Discharge: 2016-03-25 | Disposition: A | Discharge status: Home / Self Care | Payer: Medicare Other | Attending: Emergency Medicine | Admitting: Emergency Medicine

## 2016-03-25 ENCOUNTER — Encounter (HOSPITAL_COMMUNITY): Payer: Self-pay | Admitting: Emergency Medicine

## 2016-03-25 DIAGNOSIS — Z7951 Long term (current) use of inhaled steroids: Secondary | ICD-10-CM | POA: Insufficient documentation

## 2016-03-25 DIAGNOSIS — J449 Chronic obstructive pulmonary disease, unspecified: Secondary | ICD-10-CM | POA: Insufficient documentation

## 2016-03-25 DIAGNOSIS — E785 Hyperlipidemia, unspecified: Secondary | ICD-10-CM | POA: Diagnosis not present

## 2016-03-25 DIAGNOSIS — T7840XA Allergy, unspecified, initial encounter: Secondary | ICD-10-CM | POA: Insufficient documentation

## 2016-03-25 DIAGNOSIS — E039 Hypothyroidism, unspecified: Secondary | ICD-10-CM | POA: Diagnosis not present

## 2016-03-25 DIAGNOSIS — Z791 Long term (current) use of non-steroidal anti-inflammatories (NSAID): Secondary | ICD-10-CM | POA: Insufficient documentation

## 2016-03-25 DIAGNOSIS — I110 Hypertensive heart disease with heart failure: Secondary | ICD-10-CM | POA: Insufficient documentation

## 2016-03-25 DIAGNOSIS — F329 Major depressive disorder, single episode, unspecified: Secondary | ICD-10-CM | POA: Insufficient documentation

## 2016-03-25 DIAGNOSIS — I509 Heart failure, unspecified: Secondary | ICD-10-CM | POA: Diagnosis not present

## 2016-03-25 DIAGNOSIS — Z87891 Personal history of nicotine dependence: Secondary | ICD-10-CM | POA: Diagnosis not present

## 2016-03-25 MED ORDER — PREDNISONE 20 MG PO TABS
20.0000 mg | ORAL_TABLET | Freq: Once | ORAL | Status: AC
  Administered 2016-03-25: 20 mg via ORAL
  Filled 2016-03-25: qty 1

## 2016-03-25 MED ORDER — DOXYCYCLINE HYCLATE 100 MG PO CAPS
100.0000 mg | ORAL_CAPSULE | Freq: Two times a day (BID) | ORAL | Status: DC
Start: 2016-03-25 — End: 2016-05-15

## 2016-03-25 MED ORDER — PREDNISONE 20 MG PO TABS: 20.0000 mg | ORAL_TABLET | Freq: Every day | ORAL | Status: DC

## 2016-03-25 MED ORDER — DOXYCYCLINE HYCLATE 100 MG PO TABS
100.0000 mg | ORAL_TABLET | Freq: Once | ORAL | Status: AC
Start: 2016-03-25 — End: 2016-03-25
  Administered 2016-03-25: 100 mg via ORAL
  Filled 2016-03-25: qty 1

## 2016-03-25 NOTE — ED Notes (Signed)
Pt has red itchy/burning area to right flank from bee sting yesterday.  Denies associated sob or sensation of throat closing.

## 2016-03-25 NOTE — Discharge Instructions (Signed)
Prescriptions for antibiotic and prednisone. Return if skin redness expands or you start running a fever.

## 2016-03-25 NOTE — ED Provider Notes (Signed)
CSN: [id number]     Arrival date & time 03/25/16  1249 History   First MD Initiated Contact with Patient 03/25/16 1302     Chief Complaint  Patient presents with  . Allergic Reaction     (Consider location/radiation/quality/duration/timing/severity/associated sxs/prior Treatment) Patient is a 75 y.o. female presenting with allergic reaction.  Allergic Reaction .Marland KitchenMarland KitchenMarland KitchenPatient was stung by a bee yesterday on her right lateral abdomen. Now the area is red, inflamed, pruritic. She has had a bee sting allergy in the past. No fever or chills. She has taken no new medicine for this problem. Patient has COPD and she is on chronic home oxygen.  Past Medical History  Diagnosis Date  . COPD (chronic obstructive pulmonary disease) ([place])     home O2  . Hypertension   . Hyperlipidemia   . History of tobacco abuse   . Mild depression   . Hypothyroidism   . Diastolic dysfunction   . History of nuclear stress test 09/23/2009    dipyridamole; normal pattern of perfusion; low risk scan   . CHF (congestive heart failure) [facility])    Past Surgical History  Procedure Laterality Date  . Gallbladder surgery  1977  . Total abdominal hysterectomy  1980's  . Cataract extraction w/phaco  09/08/2012    Procedure: CATARACT EXTRACTION PHACO AND INTRAOCULAR LENS PLACEMENT (IOC);  Surgeon: Tonny Branch, MD;  Location: [facility];  Service: Ophthalmology;  Laterality: Right;  CDE:19.67  . Tonsillectomy    . Right carpal tunnel release    . Cataract extraction w/phaco  09/19/2012    Procedure: CATARACT EXTRACTION PHACO AND INTRAOCULAR LENS PLACEMENT (IOC);  Surgeon: Tonny Branch, MD;  Location: [facility];  Service: Ophthalmology;  Laterality: Left;  CDE:19.83  . Transthoracic echocardiogram  09/23/2009    EF>55%; trace TR; normal LA & RA size; normal LV & RV size & systolic function   . Colonoscopy  2007    Dr. Gala Romney: internal hemorrhoids, benign polyps  . Esophagogastroduodenoscopy  2007    Dr. Gala Romney: normal esophagus,  non-critical Schatzki's ring not manipulated, normal small bowel biopsy  . Esophagogastroduodenoscopy (egd) with esophageal dilation N/A 10/16/2013    Dr. Fields:Schatzki's ring at the gastroesophageal junction s/p dilation/MODERATE Erosive gastritis. reactive gastropathy.    Family History  Problem Relation Age of Onset  . Allergies Father   . Heart disease Mother   . Rheum arthritis Mother   . Breast cancer Daughter   . Breast cancer Sister   . Cancer Maternal Aunt   . Hypertension Mother   . Stroke Maternal Grandmother   . Stroke Sister   . Colon cancer Neg Hx    Social History  Substance Use Topics  . Smoking status: Former Smoker -- 1.00 packs/day for 50 years    Types: Cigarettes    Quit date: 09/07/2008  . Smokeless tobacco: Never Used  . Alcohol Use: No   OB History    No data available     Review of Systems  All other systems reviewed and are negative.     Allergies  Bee venom  Home Medications   Prior to Admission medications   Medication Sig Start Date End Date Taking? Authorizing Provider  albuterol (PROAIR HFA) 108 (90 Base) MCG/ACT inhaler Inhale 2 puffs into the lungs every 6 (six) hours as needed. Shortness of breath 12/12/15  Yes Praveen Mannam, MD  albuterol (PROVENTIL) (2.5 MG/3ML) 0.083% nebulizer solution Take 3 mLs (2.5 mg total) by nebulization 3 (three) times daily. 12/05/15  Yes  Marshell Garfinkel, MD  ALPRAZolam (XANAX) 0.5 MG tablet Take 0.5 mg by mouth at bedtime as needed for anxiety.   Yes Historical Provider, MD  calcium-vitamin D (OSCAL) 250-125 MG-UNIT per tablet Take 1 tablet by mouth 2 (two) times daily.   Yes Historical Provider, MD  carboxymethylcellulose (REFRESH PLUS) 0.5 % SOLN 1 drop 3 (three) times daily as needed.   Yes Historical Provider, MD  diltiazem (CARDIZEM) 120 MG tablet Take 1 tablet by mouth daily. 12/28/14  Yes Historical Provider, MD  Fluticasone Furoate-Vilanterol (BREO ELLIPTA) 100-25 MCG/INH AEPB Inhale 1 puff into the  lungs daily. 03/19/15  Yes Elsie Stain, MD  levothyroxine (SYNTHROID, LEVOTHROID) 25 MCG tablet Take 25 mcg by mouth daily.     Yes Historical Provider, MD  Multiple Vitamin (MULTIVITAMIN) capsule Take 1 capsule by mouth daily.     Yes Historical Provider, MD  naproxen sodium (ANAPROX) 220 MG tablet Take 220 mg by mouth daily as needed (pain).   Yes Historical Provider, MD  pravastatin (PRAVACHOL) 40 MG tablet Take 1 tablet (40 mg total) by mouth at bedtime. NEEDS APPOINTMENT FOR FUTURE REFILLS 04/25/15  Yes Pixie Casino, MD  tiotropium (SPIRIVA) 18 MCG inhalation capsule Place 1 capsule (18 mcg total) into inhaler and inhale daily. 10/24/15 10/23/16 Yes Praveen Mannam, MD  torsemide (DEMADEX) 20 MG tablet Take 1 tablet (20 mg total) by mouth once. Patient taking differently: Take 20 mg by mouth daily.  10/30/15  Yes Praveen Mannam, MD  ALPRAZolam (XANAX) 0.25 MG tablet Take 1 tablet (0.25 mg total) by mouth 3 (three) times daily as needed for sleep or anxiety. Patient not taking: Reported on 03/25/2016 02/25/12   Elsie Stain, MD  buPROPion [facility]) 75 MG tablet Take 1 tablet (75 mg total) by mouth 2 (two) times daily. Patient not taking: Reported on 03/25/2016 10/04/14   Sinda Du, MD  doxycycline (VIBRAMYCIN) 100 MG capsule Take 1 capsule (100 mg total) by mouth 2 (two) times daily. 03/25/16   Nat Christen, MD  pantoprazole (PROTONIX) 40 MG tablet Take 1 tablet (40 mg total) by mouth daily before breakfast. Patient not taking: Reported on 03/25/2016 10/16/13   Sinda Du, MD  predniSONE (DELTASONE) 20 MG tablet Take 1 tablet (20 mg total) by mouth daily with breakfast. 03/25/16   Nat Christen, MD   BP 123/62 mmHg  Pulse 68  Resp 24  Ht 5' (1.524 m)  Wt 110 lb (49.896 kg)  BMI 21.48 kg/m2  SpO2 100% Physical Exam  Constitutional: She is oriented to person, place, and time.  No respiratory distress  HENT:  Head: Normocephalic and atraumatic.  Eyes: Conjunctivae are normal.   Neck: Neck supple.  Cardiovascular: Normal rate and regular rhythm.   Pulmonary/Chest: Effort normal and breath sounds normal.  Abdominal: Soft. Bowel sounds are normal.  Musculoskeletal: Normal range of motion.  Neurological: She is alert and oriented to person, place, and time.  Skin:  Right lateral abdomen: Area of erythema and induration approximately 12 cm in dia  Psychiatric: She has a normal mood and affect. Her behavior is normal.  Nursing note and vitals reviewed.   ED Course  Procedures (including critical care time) Labs Review Labs Reviewed - No data to display  Imaging Review No results found. I have personally reviewed and evaluated these images and lab results as part of my medical decision-making.   EKG Interpretation None      MDM   Final diagnoses:  Allergic reaction, initial encounter  I suspect this is mostly an inflammatory response; however, there is significant erythema and induration. Will start prednisone and doxycycline.    Nat Christen, MD 03/25/16 [phone]

## 2016-03-25 NOTE — ED Notes (Signed)
Patient states she was stung on the right side of her abdomen yesterday. States "I didn't want to come but I had a bad reaction years ago and I called my doctor today and they told me to come to the ER. I'm a little more short of breath than usual but it might have been because I was rushing to get over here." Denies oral swelling. Patient talking with no difficulty. Patient has redness noted to right abdomen at triage.

## 2016-03-26 ENCOUNTER — Ambulatory Visit (HOSPITAL_COMMUNITY): Payer: Medicare Other

## 2016-03-26 DIAGNOSIS — R2681 Unsteadiness on feet: Secondary | ICD-10-CM

## 2016-03-26 DIAGNOSIS — M6281 Muscle weakness (generalized): Secondary | ICD-10-CM

## 2016-03-26 DIAGNOSIS — R2689 Other abnormalities of gait and mobility: Secondary | ICD-10-CM | POA: Diagnosis not present

## 2016-03-26 NOTE — Therapy (Signed)
[place] [address] [place], [place], [address] Phone: [phone]   Fax:  [phone]  Physical Therapy Treatment  Patient Details  Name: [name] MRN: [id number] Date of Birth: [birth year] Referring Provider: Allyn Kenner   Encounter Date: 03/26/2016      PT End of Session - 03/26/16 1041    Visit Number 8   Number of Visits 12   Date for PT Re-Evaluation 03/27/16   Authorization Type Medicare   PT Start Time 1042  pt arrived late then restroom break   PT Stop Time 1114   PT Time Calculation (min) 32 min      Past Medical History  Diagnosis Date  . COPD (chronic obstructive pulmonary disease) ([place])     home O2  . Hypertension   . Hyperlipidemia   . History of tobacco abuse   . Mild depression   . Hypothyroidism   . Diastolic dysfunction   . History of nuclear stress test 09/23/2009    dipyridamole; normal pattern of perfusion; low risk scan   . CHF (congestive heart failure) [facility])     Past Surgical History  Procedure Laterality Date  . Gallbladder surgery  1977  . Total abdominal hysterectomy  1980's  . Cataract extraction w/phaco  09/08/2012    Procedure: CATARACT EXTRACTION PHACO AND INTRAOCULAR LENS PLACEMENT (IOC);  Surgeon: Tonny Branch, MD;  Location: [facility];  Service: Ophthalmology;  Laterality: Right;  CDE:19.67  . Tonsillectomy    . Right carpal tunnel release    . Cataract extraction w/phaco  09/19/2012    Procedure: CATARACT EXTRACTION PHACO AND INTRAOCULAR LENS PLACEMENT (IOC);  Surgeon: Tonny Branch, MD;  Location: [facility];  Service: Ophthalmology;  Laterality: Left;  CDE:19.83  . Transthoracic echocardiogram  09/23/2009    EF>55%; trace TR; normal LA & RA size; normal LV & RV size & systolic function   . Colonoscopy  2007    Dr. Gala Romney: internal hemorrhoids, benign polyps  . Esophagogastroduodenoscopy  2007    Dr. Gala Romney: normal esophagus, non-critical Schatzki's ring not manipulated, normal small bowel  biopsy  . Esophagogastroduodenoscopy (egd) with esophageal dilation N/A 10/16/2013    Dr. Fields:Schatzki's ring at the gastroesophageal junction s/p dilation/MODERATE Erosive gastritis. reactive gastropathy.     There were no vitals filed for this visit.      Subjective Assessment - 03/26/16 1042    Subjective Pt reports she got stung by a bee, went to ER.  No reports of pain today.  Pt continues to c/o dizziness when initially stands up.   Currently in Pain? No/denies             [facility] Adult PT Treatment/Exercise - 03/26/16 0001    Knee/Hip Exercises: Standing   Lateral Step Up Both;1 set;10 reps   Lateral Step Up Limitations 4 inch box    Forward Step Up Both;1 set;10 reps   Forward Step Up Limitations 4 inch box    Other Standing Knee Exercises sit to stand no UEs x5             Balance Exercises - 03/26/16 1054    Balance Exercises: Standing   Standing Eyes Opened Narrow base of support (BOS);Foam/compliant surface;Head turns  UE and head movements with NBOS on airex   Tandem Stance Eyes open;3 reps;30 secs  on airex   SLS Eyes open;3 reps  Lt 12", Rt 25" max of 3   Rockerboard Lateral;UE support  2 min UE A  Tandem Gait 2 reps             PT Short Term Goals - 02/26/16 1235    PT SHORT TERM GOAL #1   Title After 2 weeks pt will demonstrate indep in beginner HEP to improve self efficacy in therapy progress.    Status New   PT SHORT TERM GOAL #2   Title After 4 weeks, pt will demonstrate SLS balance >10sec bilat to imporved safety during ADL at home.   Status New   PT SHORT TERM GOAL #3   Title After 4 weeks patient will demonstrate ability to tolerate 6 minutes of sustained walking at speed of choice to improve safe access to the community for IADL.    Status New           PT Long Term Goals - 02/26/16 1236    PT LONG TERM GOAL #1   Title After 7 weeks pt will demonstrate indep in advanced HEP to improve self efficacy in continued progress  s/p DC.    Status New   PT LONG TERM GOAL #2   Title After 7 weeks, pt will demonstrate SLS balance >30 sec bilat to improve safety during IADL in the community.    Status New   PT LONG TERM GOAL #3   Title After 7 weeks, pt will demonstrate ability to walk 1032ft during 6MWT to improve functional tolerance and maintain independence in IADL in community.    Status New               Plan - 03/26/16 1104    Clinical Impression Statement Pt arrived today with increased difficulty breathing she feels due to the heat outside as well as bee sting side effects (highly allergic) 2 days ago with reports of increased SOB following sting.  Session focus on balance and functional strengthening with rest breaks provided PRN due to fatigue and c/o SOB.  Vitals assessed through session with SpO2 range from 87-98%.  When dropped to 87% pt chose to increased to 4L O2 A via nasal canal with abiility to keep SpO2 range from 96-98% though rest breaks were required for fatigue and SOB.  No gait training complete this session per pt request due to increased difficutly breathing today.  No reoprts of pain.     Rehab Potential Fair   Clinical Impairments Affecting Rehab Potential Chronic COPD/emphesema    PT Frequency 2x / week   PT Duration 8 weeks   PT Treatment/Interventions Biofeedback;Moist Heat;Therapeutic exercise;Therapeutic activities;Functional mobility training;Stair training;Gait training;DME Instruction;Balance training;Patient/family education   PT Next Visit Plan progress gait training/trials with adequate rest between;  continue with general functional strength and hip hiking, balance training       Patient will benefit from skilled therapeutic intervention in order to improve the following deficits and impairments:  Abnormal gait, Difficulty walking, Cardiopulmonary status limiting activity, Decreased activity tolerance, Decreased balance, Decreased mobility, Decreased strength, Decreased  endurance  Visit Diagnosis: Unsteadiness on feet  Muscle weakness (generalized)  Other abnormalities of gait and mobility     Problem List Patient Active Problem List   Diagnosis Date Noted  . Diarrhea 06/06/2015  . Abdominal pain 06/06/2015  . Acute on chronic respiratory failure ([place]) 10/04/2014  . COPD exacerbation ([place])   . Dysphagia   . Respiratory distress 09/30/2014  . Hypoxia 09/30/2014  . COPD with acute exacerbation ([place]) 09/30/2014  . Chronic respiratory failure with hypoxia ([place]) 02/02/2014  . Alopecia 02/02/2014  . Loss of  weight 01/24/2014  . Unspecified constipation 01/24/2014  . Esophageal dysphagia 01/24/2014  . COPD Golds D, frequent exacerbations   . Hypertension   . Hyperlipidemia   . Hypothyroidism   . Mild depression    Ihor [place], [place]; [place]   Aldona Lento 03/26/2016, 12:56 PM  [place], [place], [address] Phone: [phone]   Fax:  [phone]  Name: [name] MRN: [id number] Date of Birth: 1941-06-16

## 2016-03-31 ENCOUNTER — Encounter (HOSPITAL_COMMUNITY): Payer: Medicare Other | Admitting: Physical Therapy

## 2016-04-02 ENCOUNTER — Ambulatory Visit (HOSPITAL_COMMUNITY): Payer: Medicare Other

## 2016-04-02 DIAGNOSIS — M6281 Muscle weakness (generalized): Secondary | ICD-10-CM

## 2016-04-02 DIAGNOSIS — R2689 Other abnormalities of gait and mobility: Secondary | ICD-10-CM

## 2016-04-02 DIAGNOSIS — R2681 Unsteadiness on feet: Secondary | ICD-10-CM

## 2016-04-02 NOTE — Therapy (Signed)
[facility] [address] [place], [place], [address] Phone: [phone]   Fax:  [phone]  Physical Therapy Treatment  Patient Details  Name: [name] MRN: [id number] Date of Birth: 1941-07-26 Referring Provider: Allyn Kenner   Encounter Date: 04/16/16      PT End of Session - 04/16/2016 1320    Visit Number 9   Number of Visits 12   Date for PT Re-Evaluation 04/21/16   Authorization Type Medicare   Authorization Time Period G-Codes done on 04/16/16   PT Start Time 1042  Pt arrived late.    PT Stop Time 1112   PT Time Calculation (min) 30 min   Equipment Utilized During Treatment Oxygen   Activity Tolerance Patient tolerated treatment well;Patient limited by fatigue   Behavior During Therapy [facility] for tasks assessed/performed      Past Medical History:  Diagnosis Date  . CHF (congestive heart failure) ([place])   . COPD (chronic obstructive pulmonary disease) ([place])    home O2  . Diastolic dysfunction   . History of nuclear stress test 09/23/2009   dipyridamole; normal pattern of perfusion; low risk scan   . History of tobacco abuse   . Hyperlipidemia   . Hypertension   . Hypothyroidism   . Mild depression     Past Surgical History:  Procedure Laterality Date  . CATARACT EXTRACTION W/PHACO  09/08/2012   Procedure: CATARACT EXTRACTION PHACO AND INTRAOCULAR LENS PLACEMENT (IOC);  Surgeon: Tonny Branch, MD;  Location: [facility];  Service: Ophthalmology;  Laterality: Right;  CDE:19.67  . CATARACT EXTRACTION W/PHACO  09/19/2012   Procedure: CATARACT EXTRACTION PHACO AND INTRAOCULAR LENS PLACEMENT (IOC);  Surgeon: Tonny Branch, MD;  Location: [facility];  Service: Ophthalmology;  Laterality: Left;  CDE:19.83  . COLONOSCOPY  2007   Dr. Gala Romney: internal hemorrhoids, benign polyps  . ESOPHAGOGASTRODUODENOSCOPY  2007   Dr. Gala Romney: normal esophagus, non-critical Schatzki's ring not manipulated, normal small bowel biopsy  . ESOPHAGOGASTRODUODENOSCOPY  (EGD) WITH ESOPHAGEAL DILATION N/A 10/16/2013   Dr. Fields:Schatzki's ring at the gastroesophageal junction s/p dilation/MODERATE Erosive gastritis. reactive gastropathy.   Marland Kitchen [place]  . right carpal tunnel release    . TONSILLECTOMY    . TOTAL ABDOMINAL HYSTERECTOMY  1980's  . TRANSTHORACIC ECHOCARDIOGRAM  09/23/2009   EF>55%; trace TR; normal LA & RA size; normal LV & RV size & systolic function     There were no vitals filed for this visit.      Subjective Assessment - 04/16/16 1046    Subjective Pt feeling better since bee sting. She has not been out of the house too much, but she reports that her motivation to increase acitivty at home has been limited. She reports that her hair is coming  handfuls, but her doctor reports its just related to anxiety.    Currently in Pain? No/denies            [facility] PT Assessment - 2016-04-16 0001      Assessment   Medical Diagnosis Generalized Weakness   Referring Provider Allyn Kenner    Onset Date/Surgical Date 11/26/15   Next MD Visit nothing scheduled     Balance Screen   Has the patient fallen in the past 6 months No   Has the patient had a decrease in activity level because of a fear of falling?  No   Is the patient reluctant to leave their home because of a fear of falling?  No  Prior Function   Level of Independence Independent  3-3.5L/min at all times for several years.      Sit to Stand   Comments 5xSTS: 11.83s  (11.66s, hands free at eval on 6/21)     Strength   Right Hip Flexion 5/5  (4/5 on 6/21 at eval)   Right Hip Extension 4-/5  (4-/5 on 6/21 at eval)   Right Hip External Rotation  5/5  (4/5 on 6/21 at eval)   Right Hip Internal Rotation 5/5  (4/5 on 6/21 at eval)   Right Hip ABduction 5/5  (4/5 on 6/21 at eval)   Right Hip ADduction 5/5  (5/5 on 6/21 at eval)   Left Hip Flexion 5/5  (4+/5 on 6/21 at eval)   Left Hip Extension 4-/5  (4-/5 on 6/21 at eval)   Left Hip External Rotation 5/5   (4/5 on 6/21 at eval)   Left Hip Internal Rotation 5/5  (4/5 on 6/21 at eval)   Left Hip ABduction 5/5  (4/5 on 6/21 at eval)   Left Hip ADduction 5/5  (5/5 on 6/21 at eval)   Right Knee Flexion 5/5  (5/5 on 6/21 at eval)   Right Knee Extension 5/5  (5/5 on 6/21 at eval)   Left Knee Flexion 4+/5  (4+/5 on 6/21 at eval)   Left Knee Extension 5/5  (5/5 on 6/21 at eval)   Right Ankle Dorsiflexion 5/5  (5/5 on 6/21 at eval)   Right Ankle Plantar Flexion 1/5  (7 SLSL heel raises; 3 on 6/21 at eval)   Left Ankle Dorsiflexion 5/5  (5/5 on 6/21 at eval)   Left Ankle Plantar Flexion --  (7 SLSL heel raises; 7 on 6/21 at eval)     Ambulation/Gait   Gait Comments 562'-   3MWT (0.55ms)      Static Standing Balance   Static Standing Balance -  Activities  Single Leg Stance - Right Leg;Single Leg Stance - Left Leg  R: 4.8s; L: 2.4s;                              PT Education - 04/02/16 1318    Education provided Yes   Education Details Explained that progress toward goals will remain limited if the patient continues to not change activity level at home and remains non compliant with HEP.    Person(s) Educated Patient   Methods Explanation;Handout   Comprehension Verbalized understanding;Returned demonstration          PT Short Term Goals - 04/02/16 1329      PT SHORT TERM GOAL #1   Title After 2 weeks pt will demonstrate indep in beginner HEP to improve self efficacy in therapy progress.    Status Not Met     PT SHORT TERM GOAL #2   Title After 4 weeks, pt will demonstrate SLS balance >10sec bilat to imporved safety during ADL at home.   Status Not Met     PT SHORT TERM GOAL #3   Title After 4 weeks patient will demonstrate ability to tolerate 6 minutes of sustained walking at speed of choice to improve safe access to the community for IADL.    Status Not Met           PT Long Term Goals - 04/02/16 1329      PT LONG TERM GOAL #1   Title  After 7 weeks pt will demonstrate indep in  advanced HEP to improve self efficacy in continued progress s/p DC.    Status On-going     PT LONG TERM GOAL #2   Title After 7 weeks, pt will demonstrate SLS balance >30 sec bilat to improve safety during IADL in the community.    Status On-going     PT LONG TERM GOAL #3   Title After 7 weeks, pt will demonstrate ability to walk 1062f during 6MWT to improve functional tolerance and maintain independence in IADL in community.    Baseline 500+ft in 3MWT.    Status Partially Met               Plan - 008/03/20171323    Clinical Impression Statement Reassessment done today. Pt reports she has felt little motivation to push herself the past few days but is more motivated after today's session. Strength as assessed via MMT reveals impovements grossly, but the patient remains globally weak, especially in the ankles. 5x STS is unchanged, however is performed close to healthy population norms. The patient demonstrates most improvement in tolerance to prolonged ambulation, which has improved ~150% since evaluation, however, the patient likely would not tolerate a 6MWT at this time without further instruction/education on pacing. Balance remains unchanged with SLS <5 seconds bilat, concerning for falls risk and subsequent injury.    Rehab Potential Fair   Clinical Impairments Affecting Rehab Potential Chronic COPD/emphesema    PT Frequency 2x / week   PT Duration 8 weeks   PT Treatment/Interventions Biofeedback;Moist Heat;Therapeutic exercise;Therapeutic activities;Functional mobility training;Stair training;Gait training;DME Instruction;Balance training;Patient/family education   PT Next Visit Plan progress gait training/trials with adequate rest between;  continue with general functional strength and hip hiking, balance training    PT Home Exercise Plan Continue to drive prlonged gait at slower paces and lots and lots of balance training.    Consulted  and Agree with Plan of Care Patient      Patient will benefit from skilled therapeutic intervention in order to improve the following deficits and impairments:  Abnormal gait, Difficulty walking, Cardiopulmonary status limiting activity, Decreased activity tolerance, Decreased balance, Decreased mobility, Decreased strength, Decreased endurance  Visit Diagnosis: Unsteadiness on feet  Muscle weakness (generalized)  Other abnormalities of gait and mobility       G-Codes - 003-Aug-20171330    Functional Assessment Tool Used Clinical Judgment    Functional Limitation Mobility: Walking and moving around   Mobility: Walking and Moving Around Current Status [phone] At least 20 percent but less than 40 percent impaired, limited or restricted   Mobility: Walking and Moving Around Goal Status [phone] At least 1 percent but less than 20 percent impaired, limited or restricted      Problem List Patient Active Problem List   Diagnosis Date Noted  . Diarrhea 06/06/2015  . Abdominal pain 06/06/2015  . Acute on chronic respiratory failure ([place] 10/04/2014  . COPD exacerbation ([place]   . Dysphagia   . Respiratory distress 09/30/2014  . Hypoxia 09/30/2014  . COPD with acute exacerbation ([place] 09/30/2014  . Chronic respiratory failure with hypoxia ([place] 02/02/2014  . Alopecia 02/02/2014  . Loss of weight 01/24/2014  . Unspecified constipation 01/24/2014  . Esophageal dysphagia 01/24/2014  . COPD Golds D, frequent exacerbations   . Hypertension   . Hyperlipidemia   . Hypothyroidism   . Mild depression    1:33 PM, 008/03/2017AEtta Grandchild PT, DPT Physical Therapist at [phone](office)  [place], [place], [address] Phone: [phone]   Fax:  [phone]  Name: [name] MRN: [id number] Date of Birth: 1940/12/21

## 2016-04-07 ENCOUNTER — Ambulatory Visit (HOSPITAL_COMMUNITY): Payer: Medicare Other | Attending: Internal Medicine

## 2016-04-07 DIAGNOSIS — R2689 Other abnormalities of gait and mobility: Secondary | ICD-10-CM

## 2016-04-07 DIAGNOSIS — M6281 Muscle weakness (generalized): Secondary | ICD-10-CM | POA: Diagnosis not present

## 2016-04-07 DIAGNOSIS — R2681 Unsteadiness on feet: Secondary | ICD-10-CM | POA: Diagnosis not present

## 2016-04-07 NOTE — Therapy (Signed)
[facility] [place], [place], [address] Phone: [phone]   Fax:  [phone]  Physical Therapy Treatment  Patient Details  Name: [name] MRN: [id number] Date of Birth: Jun 28, 1941 Referring Provider: Allyn Kenner   Encounter Date: 04/07/2016      PT End of Session - 04/07/16 1203    Visit Number 10   Number of Visits 12   Date for PT Re-Evaluation 04/21/16   Authorization Type Medicare   Authorization Time Period G-Codes done on 2/53/66 visit 2022-11-22; Cert dates 44/03-4/74/2595   PT Start Time 1122   PT Stop Time 1200   PT Time Calculation (min) 38 min   Equipment Utilized During Treatment Oxygen;Gait belt   Activity Tolerance Patient tolerated treatment well;Patient limited by fatigue  Fatigue scale 6/10 at EOS; c/o SOB   Behavior During Therapy [facility] for tasks assessed/performed      Past Medical History:  Diagnosis Date  . CHF (congestive heart failure) ([place])   . COPD (chronic obstructive pulmonary disease) ([place])    home O2  . Diastolic dysfunction   . History of nuclear stress test 09/23/2009   dipyridamole; normal pattern of perfusion; low risk scan   . History of tobacco abuse   . Hyperlipidemia   . Hypertension   . Hypothyroidism   . Mild depression     Past Surgical History:  Procedure Laterality Date  . CATARACT EXTRACTION W/PHACO  09/08/2012   Procedure: CATARACT EXTRACTION PHACO AND INTRAOCULAR LENS PLACEMENT (IOC);  Surgeon: Tonny Branch, MD;  Location: [facility];  Service: Ophthalmology;  Laterality: Right;  CDE:19.67  . CATARACT EXTRACTION W/PHACO  09/19/2012   Procedure: CATARACT EXTRACTION PHACO AND INTRAOCULAR LENS PLACEMENT (IOC);  Surgeon: Tonny Branch, MD;  Location: [facility];  Service: Ophthalmology;  Laterality: Left;  CDE:19.83  . COLONOSCOPY  2007   Dr. Gala Romney: internal hemorrhoids, benign polyps  . ESOPHAGOGASTRODUODENOSCOPY  2007   Dr. Gala Romney: normal esophagus, non-critical Schatzki's ring not  manipulated, normal small bowel biopsy  . ESOPHAGOGASTRODUODENOSCOPY (EGD) WITH ESOPHAGEAL DILATION N/A 10/16/2013   Dr. Fields:Schatzki's ring at the gastroesophageal junction s/p dilation/MODERATE Erosive gastritis. reactive gastropathy.   Marland Kitchen [place]  . right carpal tunnel release    . TONSILLECTOMY    . TOTAL ABDOMINAL HYSTERECTOMY  1980's  . TRANSTHORACIC ECHOCARDIOGRAM  09/23/2009   EF>55%; trace TR; normal LA & RA size; normal LV & RV size & systolic function     There were no vitals filed for this visit.      Subjective Assessment - 04/07/16 1126    Subjective Pt reports she is feeling fine today, just tired.  No reports of pain today.  Feels her balance and breathing are most difficulty currently.   Currently in Pain? No/denies                         [facility] Adult PT Treatment/Exercise - 04/07/16 0001      Ambulation/Gait   Gait Comments 510  3MWT initially session with nasal canal O2 A     Knee/Hip Exercises: Standing   Heel Raises Right;Left;15 reps  SLS with UE A   Forward Lunges 2 sets;10 reps   Forward Lunges Limitations on floor   Functional Squat 2 sets;10 reps   SLS Lt 17", Rt 8" max of 3     Knee/Hip Exercises: Seated   Other Seated Knee/Hip Exercises ankel PF with red TB x20 each  Balance Exercises - 04/07/16 1247      Balance Exercises: Standing   SLS Eyes open;3 reps   Rockerboard Lateral;UE support   Tandem Gait 2 reps             PT Short Term Goals - 04/02/16 1329      PT SHORT TERM GOAL #1   Title After 2 weeks pt will demonstrate indep in beginner HEP to improve self efficacy in therapy progress.    Status Not Met     PT SHORT TERM GOAL #2   Title After 4 weeks, pt will demonstrate SLS balance >10sec bilat to imporved safety during ADL at home.   Status Not Met     PT SHORT TERM GOAL #3   Title After 4 weeks patient will demonstrate ability to tolerate 6 minutes of sustained walking  at speed of choice to improve safe access to the community for IADL.    Status Not Met           PT Long Term Goals - 04/02/16 1329      PT LONG TERM GOAL #1   Title After 7 weeks pt will demonstrate indep in advanced HEP to improve self efficacy in continued progress s/p DC.    Status On-going     PT LONG TERM GOAL #2   Title After 7 weeks, pt will demonstrate SLS balance >30 sec bilat to improve safety during IADL in the community.    Status On-going     PT LONG TERM GOAL #3   Title After 7 weeks, pt will demonstrate ability to walk 1067f during 6MWT to improve functional tolerance and maintain independence in IADL in community.    Baseline 500+ft in 3MWT.    Status Partially Met               Plan - 04/07/16 1238    Clinical Impression Statement Session focus on improve activity tolerance and LE strengthening to improve gait mechanics and balance training.  Pt limited by fatigue and c/o SOB throughout session requiring seated or standing rest breaks.  Vitals taken through session with SpO2 % range from 93-97% and HR from 78 initially to 110 bmp.  Added theraband resistance with plantarflexion exercise for strengthening, pt had theraband slip off foot and hit her in the glasses on her face.  No reports of pain at EOS, do F/U next session..  Progressed to lunges and squats for gluteal strengthening with therapist facilitation for proper form and technqiue with new exercises.     Clinical Impairments Affecting Rehab Potential Chronic COPD/emphesema    PT Frequency 2x / week   PT Duration 8 weeks   PT Treatment/Interventions Biofeedback;Moist Heat;Therapeutic exercise;Therapeutic activities;Functional mobility training;Stair training;Gait training;DME Instruction;Balance training;Patient/family education   PT Next Visit Plan progress gait training/trials with adequate rest between;  continue with general functional strength and hip hiking, balance training    PT Home Exercise  Plan Continue to drive prlonged gait at slower paces and lots and lots of balance training.       Patient will benefit from skilled therapeutic intervention in order to improve the following deficits and impairments:  Abnormal gait, Difficulty walking, Cardiopulmonary status limiting activity, Decreased activity tolerance, Decreased balance, Decreased mobility, Decreased strength, Decreased endurance  Visit Diagnosis: Unsteadiness on feet  Muscle weakness (generalized)  Other abnormalities of gait and mobility     Problem List Patient Active Problem List   Diagnosis Date Noted  . Diarrhea 06/06/2015  .  Abdominal pain 06/06/2015  . Acute on chronic respiratory failure ([place]) 10/04/2014  . COPD exacerbation ([place])   . Dysphagia   . Respiratory distress 09/30/2014  . Hypoxia 09/30/2014  . COPD with acute exacerbation ([place]) 09/30/2014  . Chronic respiratory failure with hypoxia ([place]) 02/02/2014  . Alopecia 02/02/2014  . Loss of weight 01/24/2014  . Unspecified constipation 01/24/2014  . Esophageal dysphagia 01/24/2014  . COPD Golds D, frequent exacerbations   . Hypertension   . Hyperlipidemia   . Hypothyroidism   . Mild depression    [place], [place]; [place]  Aldona Lento 04/07/2016, 12:48 PM  [facility] [address] [place], [place], [address] Phone: [phone]   Fax:  [phone]  Name: SHAYLEY MEDLIN MRN: [id number] Date of Birth: Apr 24, 1941

## 2016-04-09 ENCOUNTER — Encounter (HOSPITAL_COMMUNITY): Payer: Medicare Other

## 2016-04-10 ENCOUNTER — Ambulatory Visit: Payer: Medicare Other | Admitting: Pulmonary Disease

## 2016-04-10 ENCOUNTER — Ambulatory Visit: Payer: Medicare Other

## 2016-04-14 ENCOUNTER — Ambulatory Visit (HOSPITAL_COMMUNITY): Payer: Medicare Other

## 2016-04-14 ENCOUNTER — Encounter (HOSPITAL_COMMUNITY): Payer: Self-pay

## 2016-04-14 DIAGNOSIS — R2689 Other abnormalities of gait and mobility: Secondary | ICD-10-CM | POA: Diagnosis not present

## 2016-04-14 DIAGNOSIS — R2681 Unsteadiness on feet: Secondary | ICD-10-CM

## 2016-04-14 DIAGNOSIS — M6281 Muscle weakness (generalized): Secondary | ICD-10-CM

## 2016-04-14 NOTE — Therapy (Signed)
[facility] [address] [place], [place], [address] Phone: [phone]   Fax:  [phone]  Physical Therapy Treatment  Patient Details  Name: [name] MRN: [id number] Date of Birth: 02-Aug-1941 Referring Provider: Allyn Kenner   Encounter Date: 04/14/2016      PT End of Session - 04/14/16 1200    Visit Number 11   Number of Visits 14   Date for PT Re-Evaluation 04/27/16   Authorization Type Medicare   Authorization Time Period G-Codes done on 03/07/09 visit September 20, 2022; Cert dates 03/49-6/07/6434   PT Start Time 1116   PT Stop Time 1200   PT Time Calculation (min) 44 min   Equipment Utilized During Treatment Oxygen;Gait belt   Activity Tolerance Patient tolerated treatment well;Patient limited by fatigue   Behavior During Therapy [facility] for tasks assessed/performed      Past Medical History:  Diagnosis Date  . CHF (congestive heart failure) ([place])   . COPD (chronic obstructive pulmonary disease) ([place])    home O2  . Diastolic dysfunction   . History of nuclear stress test 09/23/2009   dipyridamole; normal pattern of perfusion; low risk scan   . History of tobacco abuse   . Hyperlipidemia   . Hypertension   . Hypothyroidism   . Mild depression     Past Surgical History:  Procedure Laterality Date  . CATARACT EXTRACTION W/PHACO  09/08/2012   Procedure: CATARACT EXTRACTION PHACO AND INTRAOCULAR LENS PLACEMENT (IOC);  Surgeon: Tonny Branch, MD;  Location: [facility];  Service: Ophthalmology;  Laterality: Right;  CDE:19.67  . CATARACT EXTRACTION W/PHACO  09/19/2012   Procedure: CATARACT EXTRACTION PHACO AND INTRAOCULAR LENS PLACEMENT (IOC);  Surgeon: Tonny Branch, MD;  Location: [facility];  Service: Ophthalmology;  Laterality: Left;  CDE:19.83  . COLONOSCOPY  2007   Dr. Gala Romney: internal hemorrhoids, benign polyps  . ESOPHAGOGASTRODUODENOSCOPY  2007   Dr. Gala Romney: normal esophagus, non-critical Schatzki's ring not manipulated, normal small bowel biopsy  .  ESOPHAGOGASTRODUODENOSCOPY (EGD) WITH ESOPHAGEAL DILATION N/A 10/16/2013   Dr. Fields:Schatzki's ring at the gastroesophageal junction s/p dilation/MODERATE Erosive gastritis. reactive gastropathy.   Marland Kitchen [place]  . right carpal tunnel release    . TONSILLECTOMY    . TOTAL ABDOMINAL HYSTERECTOMY  1980's  . TRANSTHORACIC ECHOCARDIOGRAM  09/23/2009   EF>55%; trace TR; normal LA & RA size; normal LV & RV size & systolic function     There were no vitals filed for this visit.      Subjective Assessment - 04/14/16 1120    Subjective Pt doing well today, nothing new to report. Feeling well today.    Currently in Pain? No/denies      Intervention- 04/14/16  -Eyes closed narrow stance on firm surface 3x30sec -Narrow stance on foam cone taps with weighted ball blue + trunk rotation 4x8 taps -Eyes open narrow stance on foam 20x vertical ball tosses to self -eyes open narrow stance on foam 20x rebounding ball off wall 2' above head -L-SLS with RLE foot taps at 12, 1, 2, 3:00 4x8 -R-SLS with LLE foot taps at 12, 11, 10, 9:00 4x8 -Semitandem stance 2x30s, eyes open, firm surface -AMB/steppage gait over 4-4" hurdles 6x -Retro AMB 6x15' VC for step throughout gait -Heel raises 2x15 with forward trunk lean and rocking for full range.  -Supine glute max bridge, starting hips at 90 degrees and feet elevated on 8" step 2x15           PT Short  Term Goals - 04/02/16 1329      PT SHORT TERM GOAL #1   Title After 2 weeks pt will demonstrate indep in beginner HEP to improve self efficacy in therapy progress.    Status Not Met     PT SHORT TERM GOAL #2   Title After 4 weeks, pt will demonstrate SLS balance >10sec bilat to imporved safety during ADL at home.   Status Not Met     PT SHORT TERM GOAL #3   Title After 4 weeks patient will demonstrate ability to tolerate 6 minutes of sustained walking at speed of choice to improve safe access to the community for IADL.    Status Not  Met           PT Long Term Goals - 04/02/16 1329      PT LONG TERM GOAL #1   Title After 7 weeks pt will demonstrate indep in advanced HEP to improve self efficacy in continued progress s/p DC.    Status On-going     PT LONG TERM GOAL #2   Title After 7 weeks, pt will demonstrate SLS balance >30 sec bilat to improve safety during IADL in the community.    Status On-going     PT LONG TERM GOAL #3   Title After 7 weeks, pt will demonstrate ability to walk 1000ft during 6MWT to improve functional tolerance and maintain independence in IADL in community.    Baseline 500+ft in 3MWT.    Status Partially Met               Plan - 04/14/16 1203    Clinical Impression Statement Session increasing focus on balance and isolated stength deficits reveal at reassessment. Pt tolerates activiy well, new balalnce activity, but still is most limited during dynamic SLS during functional movement. Trunk control on foam in narrow stance is somewhat challenged, but overall fairly good. Encouraged the patient to rock forward during plantar flexion strengthening to access full available ROM which is typically limited    Rehab Potential Fair   Clinical Impairments Affecting Rehab Potential Chronic COPD/emphesema    PT Frequency 2x / week   PT Duration 8 weeks   PT Treatment/Interventions Biofeedback;Moist Heat;Therapeutic exercise;Therapeutic activities;Functional mobility training;Stair training;Gait training;DME Instruction;Balance training;Patient/family education   PT Next Visit Plan progress gait training/trials with adequate rest between;  continue with general functional strength and hip hiking, balance training    PT Home Exercise Plan Continue with retroAMB, hurdles, move ball tosses to semi tandem stance, move eyes closed to semitandem stance; repeat heel raises elevated glute bridge. Progress all other as available.    Consulted and Agree with Plan of Care Patient      Patient will  benefit from skilled therapeutic intervention in order to improve the following deficits and impairments:  Abnormal gait, Difficulty walking, Cardiopulmonary status limiting activity, Decreased activity tolerance, Decreased balance, Decreased mobility, Decreased strength, Decreased endurance  Visit Diagnosis: Unsteadiness on feet  Muscle weakness (generalized)  Other abnormalities of gait and mobility     Problem List Patient Active Problem List   Diagnosis Date Noted  . Diarrhea 06/06/2015  . Abdominal pain 06/06/2015  . Acute on chronic respiratory failure ([facility]) 10/04/2014  . COPD exacerbation ([facility])   . Dysphagia   . Respiratory distress 09/30/2014  . Hypoxia 09/30/2014  . COPD with acute exacerbation ([facility]) 09/30/2014  . Chronic respiratory failure with hypoxia ([facility]) 02/02/2014  . Alopecia 02/02/2014  . Loss of weight 01/24/2014  .   Unspecified constipation 01/24/2014  . Esophageal dysphagia 01/24/2014  . COPD Golds D, frequent exacerbations   . Hypertension   . Hyperlipidemia   . Hypothyroidism   . Mild depression     12:15 PM, 04/14/16 Allan C Buccola, PT, DPT Physical Therapist at [facility] [phone] (office)     [facility] [address] [place], [place], [address] Phone: [phone]   Fax:  [phone]  Name: [name] MRN: [id number] Date of Birth: 04/29/1941    

## 2016-04-15 ENCOUNTER — Ambulatory Visit (INDEPENDENT_AMBULATORY_CARE_PROVIDER_SITE_OTHER): Payer: Medicare Other | Admitting: Pulmonary Disease

## 2016-04-15 DIAGNOSIS — J9611 Chronic respiratory failure with hypoxia: Secondary | ICD-10-CM

## 2016-04-16 NOTE — Progress Notes (Signed)
6MWT 04/15/16:  Walked 4.5 Laps / Baseline Sat 98% on 3 L/m / Nadir Sat 91% on 3 L/m at end of 4.5 Laps (oxygen was pulse)

## 2016-04-21 ENCOUNTER — Ambulatory Visit (HOSPITAL_COMMUNITY): Payer: Medicare Other | Admitting: Physical Therapy

## 2016-04-21 DIAGNOSIS — M6281 Muscle weakness (generalized): Secondary | ICD-10-CM | POA: Diagnosis not present

## 2016-04-21 DIAGNOSIS — R2681 Unsteadiness on feet: Secondary | ICD-10-CM | POA: Diagnosis not present

## 2016-04-21 DIAGNOSIS — R2689 Other abnormalities of gait and mobility: Secondary | ICD-10-CM | POA: Diagnosis not present

## 2016-04-21 NOTE — Therapy (Signed)
[place], [place], [address] Phone: [phone]   Fax:  [phone]  Physical Therapy Treatment  Patient Details  Name: [name] MRN: [id number] Date of Birth: 08-12-1941 Referring Provider: Allyn Kenner   Encounter Date: 04/21/2016      PT End of Session - 04/21/16 1216    Visit Number 12   Number of Visits 14   Date for PT Re-Evaluation 04/27/16   Authorization Type Medicare   Authorization Time Period G-Codes done on 7/78/24 visit 2022/12/14; Cert dates 23/53-6/14/4315   Authorization - Visit Number 12   Authorization - Number of Visits 13   PT Start Time 1128   PT Stop Time 1215   PT Time Calculation (min) 47 min   Equipment Utilized During Treatment Oxygen;Gait belt   Activity Tolerance Patient tolerated treatment well;Patient limited by fatigue   Behavior During Therapy [facility] for tasks assessed/performed      Past Medical History:  Diagnosis Date  . CHF (congestive heart failure) ([place])   . COPD (chronic obstructive pulmonary disease) ([place])    home O2  . Diastolic dysfunction   . History of nuclear stress test 09/23/2009   dipyridamole; normal pattern of perfusion; low risk scan   . History of tobacco abuse   . Hyperlipidemia   . Hypertension   . Hypothyroidism   . Mild depression     Past Surgical History:  Procedure Laterality Date  . CATARACT EXTRACTION W/PHACO  09/08/2012   Procedure: CATARACT EXTRACTION PHACO AND INTRAOCULAR LENS PLACEMENT (IOC);  Surgeon: Tonny Branch, MD;  Location: [facility];  Service: Ophthalmology;  Laterality: Right;  CDE:19.67  . CATARACT EXTRACTION W/PHACO  09/19/2012   Procedure: CATARACT EXTRACTION PHACO AND INTRAOCULAR LENS PLACEMENT (IOC);  Surgeon: Tonny Branch, MD;  Location: [facility];  Service: Ophthalmology;  Laterality: Left;  CDE:19.83  . COLONOSCOPY  2007   Dr. Gala Romney: internal hemorrhoids, benign polyps  . ESOPHAGOGASTRODUODENOSCOPY  2007   Dr. Gala Romney: normal esophagus,  non-critical Schatzki's ring not manipulated, normal small bowel biopsy  . ESOPHAGOGASTRODUODENOSCOPY (EGD) WITH ESOPHAGEAL DILATION N/A 10/16/2013   Dr. Fields:Schatzki's ring at the gastroesophageal junction s/p dilation/MODERATE Erosive gastritis. reactive gastropathy.   Marland Kitchen [place]  . right carpal tunnel release    . TONSILLECTOMY    . TOTAL ABDOMINAL HYSTERECTOMY  1980's  . TRANSTHORACIC ECHOCARDIOGRAM  09/23/2009   EF>55%; trace TR; normal LA & RA size; normal LV & RV size & systolic function     There were no vitals filed for this visit.      Subjective Assessment - 04/21/16 1142    Subjective Pt states that she does not do any activity of a regular basis.      Currently in Pain? No/denies                              Balance Exercises - 04/21/16 1142      Balance Exercises: Standing   Balance Beam 2 reps with hurles    Tandem Gait 2 reps   Retro Gait 2 reps   Sidestepping 2 reps   Marching Limitations 10   Heel Raises Limitations 10   Toe Raise Limitations 10   Sit to Stand Time 10   Other Standing Exercises functional squat x 10            PT Education - 04/21/16 1215    Education provided  Yes   Education Details The importance of walking even if pt has to begin with walking for 2 or 3 minutes at a time.  Slowly progress to where she is walking 10 minutes at a time.    Person(s) Educated Patient   Methods Explanation;Handout   Comprehension Verbalized understanding          PT Short Term Goals - 04/21/16 1218      PT SHORT TERM GOAL #1   Title After 2 weeks pt will demonstrate indep in beginner HEP to improve self efficacy in therapy progress.    Status Not Met     PT SHORT TERM GOAL #2   Title After 4 weeks, pt will demonstrate SLS balance >10sec bilat to imporved safety during ADL at home.   Status Not Met     PT SHORT TERM GOAL #3   Title After 4 weeks patient will demonstrate ability to tolerate 6 minutes of  sustained walking at speed of choice to improve safe access to the community for IADL.    Status Not Met           PT Long Term Goals - 04/21/16 1218      PT LONG TERM GOAL #1   Title After 7 weeks pt will demonstrate indep in advanced HEP to improve self efficacy in continued progress s/p DC.    Status On-going     PT LONG TERM GOAL #2   Title After 7 weeks, pt will demonstrate SLS balance >30 sec bilat to improve safety during IADL in the community.    Status On-going     PT LONG TERM GOAL #3   Title After 7 weeks, pt will demonstrate ability to walk 1060f during 6MWT to improve functional tolerance and maintain independence in IADL in community.    Baseline 500+ft in 3MWT.    Status Partially Met               Plan - 04/21/16 1217    Clinical Impression Statement Pt needed multiple rest breaks to decrease HR to a safe level.  Pt is very motivated in department but admits to not doing anything at home.  Pt was given a walking program to start at home.    Rehab Potential Fair   Clinical Impairments Affecting Rehab Potential Chronic COPD/emphesema    PT Frequency 2x / week   PT Duration 8 weeks   PT Treatment/Interventions Biofeedback;Moist Heat;Therapeutic exercise;Therapeutic activities;Functional mobility training;Stair training;Gait training;DME Instruction;Balance training;Patient/family education   PT Next Visit Plan reassess check on how pt is doing with walking program.     PT Home Exercise Plan Continue with retroAMB, hurdles, move ball tosses to semi tandem stance, move eyes closed to semitandem stance; repeat heel raises elevated glute bridge. Progress all other as available.    Consulted and Agree with Plan of Care Patient      Patient will benefit from skilled therapeutic intervention in order to improve the following deficits and impairments:  Abnormal gait, Difficulty walking, Cardiopulmonary status limiting activity, Decreased activity tolerance,  Decreased balance, Decreased mobility, Decreased strength, Decreased endurance  Visit Diagnosis: Unsteadiness on feet  Muscle weakness (generalized)  Other abnormalities of gait and mobility     Problem List Patient Active Problem List   Diagnosis Date Noted  . Diarrhea 06/06/2015  . Abdominal pain 06/06/2015  . Acute on chronic respiratory failure ([place] 10/04/2014  . COPD exacerbation ([place]   . Dysphagia   . Respiratory distress 09/30/2014  .  Hypoxia 09/30/2014  . COPD with acute exacerbation ([place]) 09/30/2014  . Chronic respiratory failure with hypoxia ([place]) 02/02/2014  . Alopecia 02/02/2014  . Loss of weight 01/24/2014  . Unspecified constipation 01/24/2014  . Esophageal dysphagia 01/24/2014  . COPD Golds D, frequent exacerbations   . Hypertension   . Hyperlipidemia   . Hypothyroidism   . Mild depression     Rayetta Humphrey, [place] CLT [phone] 04/21/2016, 12:19 PM  [place] [facility] [address] [place], [place], [address] Phone: [phone]   Fax:  [phone]  Name: [name] MRN: [id number] Date of Birth: [birth year]

## 2016-04-23 ENCOUNTER — Ambulatory Visit (HOSPITAL_COMMUNITY): Payer: Medicare Other

## 2016-04-23 DIAGNOSIS — M6281 Muscle weakness (generalized): Secondary | ICD-10-CM

## 2016-04-23 DIAGNOSIS — R2681 Unsteadiness on feet: Secondary | ICD-10-CM | POA: Diagnosis not present

## 2016-04-23 DIAGNOSIS — R2689 Other abnormalities of gait and mobility: Secondary | ICD-10-CM

## 2016-04-23 NOTE — Therapy (Addendum)
[facility] [address] [place], [place], [address] Phone: [phone]   Fax:  [phone]  Physical Therapy Treatment  Patient Details  Name: [name] MRN: [id number] Date of Birth: 09-Oct-1940 Referring Provider: Allyn Kenner (Simultaneous filing. User may not have seen previous data.)  Encounter Date: 04/23/2016      PT End of Session - 04/23/16 1125    Visit Number 13   Number of Visits 14   Date for PT Re-Evaluation 04/27/16   Authorization Type Medicare   Authorization Time Period G-Codes done on 02/26/62 visit 11/10/22; Cert dates 33/54-5/62/5638   Authorization - Visit Number 42   Authorization - Number of Visits 13   PT Start Time 1122   PT Stop Time 1214   PT Time Calculation (min) 52 min   Equipment Utilized During Treatment Oxygen;Gait belt   Activity Tolerance Patient tolerated treatment well;Patient limited by fatigue   Behavior During Therapy WFL for tasks assessed/performed      Past Medical History:  Diagnosis Date  . CHF (congestive heart failure) ([place])   . COPD (chronic obstructive pulmonary disease) ([place])    home O2  . Diastolic dysfunction   . History of nuclear stress test 09/23/2009   dipyridamole; normal pattern of perfusion; low risk scan   . History of tobacco abuse   . Hyperlipidemia   . Hypertension   . Hypothyroidism   . Mild depression     Past Surgical History:  Procedure Laterality Date  . CATARACT EXTRACTION W/PHACO  09/08/2012   Procedure: CATARACT EXTRACTION PHACO AND INTRAOCULAR LENS PLACEMENT (IOC);  Surgeon: Tonny Branch, MD;  Location: [facility];  Service: Ophthalmology;  Laterality: Right;  CDE:19.67  . CATARACT EXTRACTION W/PHACO  09/19/2012   Procedure: CATARACT EXTRACTION PHACO AND INTRAOCULAR LENS PLACEMENT (IOC);  Surgeon: Tonny Branch, MD;  Location: [facility];  Service: Ophthalmology;  Laterality: Left;  CDE:19.83  . COLONOSCOPY  2007   Dr. Gala Romney: internal hemorrhoids, benign polyps  .  ESOPHAGOGASTRODUODENOSCOPY  2007   Dr. Gala Romney: normal esophagus, non-critical Schatzki's ring not manipulated, normal small bowel biopsy  . ESOPHAGOGASTRODUODENOSCOPY (EGD) WITH ESOPHAGEAL DILATION N/A November 10, 2013   Dr. Fields:Schatzki's ring at the gastroesophageal junction s/p dilation/MODERATE Erosive gastritis. reactive gastropathy.   Marland Kitchen [place]  . right carpal tunnel release    . TONSILLECTOMY    . TOTAL ABDOMINAL HYSTERECTOMY  1980's  . TRANSTHORACIC ECHOCARDIOGRAM  09/23/2009   EF>55%; trace TR; normal LA & RA size; normal LV & RV size & systolic function     There were no vitals filed for this visit.      Subjective Assessment - 04/23/16 1120    Subjective Pt stated she was going to begin exercises and walking program yesterday but had to take husband to MD and felt lazy following.  No reports of pain today.   Currently in Pain? No/denies            [facility] PT Assessment - 04/23/16 0001      Assessment   Medical Diagnosis Generalized Weakness  Simultaneous filing. User may not have seen previous data.   Referring Provider Allyn Kenner  Simultaneous filing. User may not have seen previous data.   Onset Date/Surgical Date 11/26/15  Simultaneous filing. User may not have seen previous data.   Next MD Visit August 2017  Simultaneous filing. User may not have seen previous data.     Prior Function   Level of Independence Independent  3-3.5L/min at all times for several years.      Sit to Stand   Comments 5xSTS: 10.56s  (11.66s, hands free at eval on 6/21)     Strength   Right Hip Flexion 5/5  (4/5 on 6/21 at eval)   Right Hip Extension 4/5  (4-/5 on 6/21 at eval)   Right Hip External Rotation  5/5  (4/5 on 6/21 at eval)   Right Hip Internal Rotation 5/5  (4/5 on 6/21 at eval)   Right Hip ABduction 5/5  (4/5 on 6/21 at eval)   Right Hip ADduction 5/5  (5/5 on 6/21 at eval)   Left Hip Flexion 5/5  (4+/5 on 6/21 at eval)   Left Hip Extension 4-/5   (4-/5 on 6/21 at eval)   Left Hip External Rotation 5/5  (4/5 on 6/21 at eval)   Left Hip Internal Rotation 5/5  (4/5 on 6/21 at eval)   Left Hip ABduction 5/5  (4/5 on 6/21 at eval)   Left Hip ADduction 5/5  (5/5 on 6/21 at eval)   Right Knee Flexion 5/5  (5/5 on 6/21 at eval)   Right Knee Extension 5/5  (5/5 on 6/21 at eval)   Left Knee Flexion 5/5  (4+/5 on 6/21 at eval)   Left Knee Extension 5/5  (5/5 on 6/21 at eval)   Right Ankle Dorsiflexion 5/5  (5/5 on 6/21 at eval)   Right Ankle Plantar Flexion 5/5  (7 SLSL heel raises; 3 on 6/21 at eval)   Left Ankle Dorsiflexion 5/5  (5/5 on 6/21 at eval)   Left Ankle Plantar Flexion 5/5  (7 SLSL heel raises; 7 on 6/21 at eval)     Ambulation/Gait   Gait Comments 422 3MWT  3MWT (0.25ms)      Static Standing Balance   Static Standing Balance -  Activities  Single Leg Stance - Right Leg;Single Leg Stance - Left Leg  R: 4.8s; L: 2.4s;               Balance Exercises - 04/23/16 1144      Balance Exercises: Standing   SLS Eyes open;3 reps  Lt 10", Rt 7" max of 3   Balance Beam 2 reps with hurles    Sidestepping 2 reps;Foam/compliant support  balance beam   Marching Limitations 10   Heel Raises Limitations 15   Toe Raise Limitations 15   Sit to Stand Time 10   Other Standing Exercises functional squat x 10              PT Short Term Goals - 04/23/16 1159      PT SHORT TERM GOAL #1   Title After 2 weeks pt will demonstrate indep in beginner HEP to improve self efficacy in therapy progress.    Baseline 04/23/2016:  Reports decreased motativation, reports compliance once in a while   Status Not Met     PT SHORT TERM GOAL #2   Title After 4 weeks, pt will demonstrate SLS balance >10sec bilat to imporved safety during ADL at home.   Baseline 04/23/2016:  Lt 10", Rt 7" max of 3   Status On-going     PT SHORT TERM GOAL #3   Title After 4 weeks patient will demonstrate ability to tolerate 6 minutes of  sustained walking at speed of choice to improve safe access to the community for IADL.    Baseline 04/23/2016:  6MWT 422 ft stopped early at 2 minutes due to decreased  SpO2 88% and HR at 114   Status Not Met           PT Long Term Goals - 04/23/16 1732      PT LONG TERM GOAL #1   Title After 7 weeks pt will demonstrate indep in advanced HEP to improve self efficacy in continued progress s/p DC.    Baseline 04/23/2106:  Pt reports she has decreased motativation to complete HEP at home.  Pt educated on benefits of increasing complince and given printout for [employer]   Status Not Met     PT LONG TERM GOAL #2   Title After 7 weeks, pt will demonstrate SLS balance >30 sec bilat to improve safety during IADL in the community.    Baseline 04/23/2016:  Lt 10", Rt 7" max of 3   Status On-going     PT LONG TERM GOAL #3   Title After 7 weeks, pt will demonstrate ability to walk 1048f during 6MWT to improve functional tolerance and maintain independence in IADL in community.    Baseline 04/23/2016:  6MWT 422 ft stopped early at 2 minutes due to decreased SpO2 88% and HR at 114   Status Not Met               Plan - 04/23/16 1342    Clinical Impression Statement Session focus on improving balance training and reassessment complete by PT.  Min guard to min A required with balance activities, pt voices she feels improvements with balance and feels safe at home.  Pt limited by cardiorespiratory issues; vital assessed through session with decreased SpO2% range from 88-97% and HR 89-114 bmp through session with frequent rest breaks required.  Overall strengthening has improved except gluteal strengthening.  Pt reports she has decreased motivation with decreased compliance with HEP, pt educated on importance of compliance with HEP for maximal benefits as well as proper breathing technqiues to improve her SpO2% with gait and functional tasks.  Pt was given [employer] to continue  strengthening and balance independenlty.     Rehab Potential Fair   Clinical Impairments Affecting Rehab Potential Chronic COPD/emphesema    PT Frequency 2x / week   PT Duration 8 weeks   PT Treatment/Interventions Biofeedback;Moist Heat;Therapeutic exercise;Therapeutic activities;Functional mobility training;Stair training;Gait training;DME Instruction;Balance training;Patient/family education   PT Next Visit Plan D/C to HEP.     G 8979  CI G  8980  CJ Patient will benefit from skilled therapeutic intervention in order to improve the following deficits and impairments:  Abnormal gait, Difficulty walking, Cardiopulmonary status limiting activity, Decreased activity tolerance, Decreased balance, Decreased mobility, Decreased strength, Decreased endurance  Visit Diagnosis: Unsteadiness on feet  Muscle weakness (generalized)  Other abnormalities of gait and mobility     Problem List Patient Active Problem List   Diagnosis Date Noted  . Diarrhea 06/06/2015  . Abdominal pain 06/06/2015  . Acute on chronic respiratory failure ([place] 10/04/2014  . COPD exacerbation ([place]   . Dysphagia   . Respiratory distress 09/30/2014  . Hypoxia 09/30/2014  . COPD with acute exacerbation ([place] 09/30/2014  . Chronic respiratory failure with hypoxia ([place] 02/02/2014  . Alopecia 02/02/2014  . Loss of weight 01/24/2014  . Unspecified constipation 01/24/2014  . Esophageal dysphagia 01/24/2014  . COPD Golds D, frequent exacerbations   . Hypertension   . Hyperlipidemia   . Hypothyroidism   . Mild depression    CIhor [place] CRayetta Humphrey PScandiaCLT [phone]/17/2017, 5:51  PM  [place], [place], [address] Phone: [phone]   Fax:  [phone]  Name: [name] MRN: [id number] Date of Birth: March 13, 1941 PHYSICAL THERAPY DISCHARGE SUMMARY  Visits from Start of Care: 13  Current  functional level related to goals / functional outcomes: As above. Pt limited functionally more from O2 stat than physical   Remaining deficits: As above ; pt unable to complete more than a 3' walk test.    Education / Equipment: Benefit of walking small amounts frequently  Plan: Patient agrees to discharge.  Patient goals were partially met. Patient is being discharged due to lack of progress.  ?????       Rayetta Humphrey, [place] CLT [phone]

## 2016-05-07 DIAGNOSIS — E039 Hypothyroidism, unspecified: Secondary | ICD-10-CM | POA: Diagnosis not present

## 2016-05-07 DIAGNOSIS — E782 Mixed hyperlipidemia: Secondary | ICD-10-CM | POA: Diagnosis not present

## 2016-05-12 DIAGNOSIS — J449 Chronic obstructive pulmonary disease, unspecified: Secondary | ICD-10-CM | POA: Diagnosis not present

## 2016-05-12 DIAGNOSIS — E785 Hyperlipidemia, unspecified: Secondary | ICD-10-CM | POA: Diagnosis not present

## 2016-05-12 DIAGNOSIS — E039 Hypothyroidism, unspecified: Secondary | ICD-10-CM | POA: Diagnosis not present

## 2016-05-12 DIAGNOSIS — F411 Generalized anxiety disorder: Secondary | ICD-10-CM | POA: Diagnosis not present

## 2016-05-12 DIAGNOSIS — D508 Other iron deficiency anemias: Secondary | ICD-10-CM | POA: Diagnosis not present

## 2016-05-12 DIAGNOSIS — E876 Hypokalemia: Secondary | ICD-10-CM | POA: Diagnosis not present

## 2016-05-12 DIAGNOSIS — I482 Chronic atrial fibrillation: Secondary | ICD-10-CM | POA: Diagnosis not present

## 2016-05-14 DIAGNOSIS — S81812A Laceration without foreign body, left lower leg, initial encounter: Secondary | ICD-10-CM | POA: Diagnosis not present

## 2016-05-14 DIAGNOSIS — S8010XA Contusion of unspecified lower leg, initial encounter: Secondary | ICD-10-CM | POA: Diagnosis not present

## 2016-05-15 ENCOUNTER — Ambulatory Visit (HOSPITAL_COMMUNITY)
Admission: RE | Admit: 2016-05-15 | Discharge: 2016-05-15 | Disposition: A | Discharge status: Home / Self Care | Payer: Medicare Other | Source: Ambulatory Visit | Attending: Pulmonary Disease | Admitting: Pulmonary Disease

## 2016-05-15 ENCOUNTER — Ambulatory Visit (INDEPENDENT_AMBULATORY_CARE_PROVIDER_SITE_OTHER): Payer: Medicare Other | Admitting: Pulmonary Disease

## 2016-05-15 ENCOUNTER — Encounter: Payer: Self-pay | Admitting: Pulmonary Disease

## 2016-05-15 VITALS — BP 116/68 | HR 109 | Ht 60.0 in | Wt 110.0 lb

## 2016-05-15 DIAGNOSIS — J9611 Chronic respiratory failure with hypoxia: Secondary | ICD-10-CM

## 2016-05-15 DIAGNOSIS — Z23 Encounter for immunization: Secondary | ICD-10-CM | POA: Diagnosis not present

## 2016-05-15 DIAGNOSIS — J438 Other emphysema: Secondary | ICD-10-CM

## 2016-05-15 DIAGNOSIS — J439 Emphysema, unspecified: Secondary | ICD-10-CM

## 2016-05-15 DIAGNOSIS — J449 Chronic obstructive pulmonary disease, unspecified: Secondary | ICD-10-CM

## 2016-05-15 MED ORDER — ALBUTEROL SULFATE (2.5 MG/3ML) 0.083% IN NEBU
2.5000 mg | INHALATION_SOLUTION | Freq: Once | RESPIRATORY_TRACT | Status: AC
  Administered 2016-05-15: 2.5 mg via RESPIRATORY_TRACT

## 2016-05-15 NOTE — Progress Notes (Signed)
Subjective:    Patient ID: [name], female    DOB: 03-20-1941, 75 y.o.   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  HPI Very Severe COPD w/ Emphysema:  Current prescribed Breo & Spiriva. No exacerbations since last appointment. She reports her breathing is doing good. No coughing. Very rare wheezing. Extremely rarely requires her rescue inhaler. She reports she does use her nebulizer 3 times a day, but more so out of habit. No nocturnal awakenings with any breathing problems.   Chronic Hypoxic Respiratory Failure:  Previously prescribed oxygen at 2 L/m at rest & 3 L/m with exertion. Patient continued to exhibit a 3 L/m pulse oxygen requirement with exertion on 6 minute walk test.  Review of Systems No chest pain, tightness, or pressure. No nausea or emesis. No fever, chills, or sweats.   Allergies  Allergen Reactions  . Bee Venom Swelling    Current Outpatient Prescriptions on File Prior to Visit  Medication Sig Dispense Refill  . albuterol (PROAIR HFA) 108 (90 Base) MCG/ACT inhaler Inhale 2 puffs into the lungs every 6 (six) hours as needed. Shortness of breath 1 Inhaler 3  . albuterol (PROVENTIL) (2.5 MG/3ML) 0.083% nebulizer solution Take 3 mLs (2.5 mg total) by nebulization 3 (three) times daily. 360 mL 2  . ALPRAZolam (XANAX) 0.5 MG tablet Take 0.5 mg by mouth at bedtime as needed for anxiety.    . calcium-vitamin D (OSCAL) 250-125 MG-UNIT per tablet Take 1 tablet by mouth 2 (two) times daily.    . carboxymethylcellulose (REFRESH PLUS) 0.5 % SOLN 1 drop 3 (three) times daily as needed.    . diltiazem (CARDIZEM) 120 MG tablet Take 1 tablet by mouth daily.  2  . Fluticasone Furoate-Vilanterol (BREO ELLIPTA) 100-25 MCG/INH AEPB Inhale 1 puff into the lungs daily. 3 each 0  . levothyroxine (SYNTHROID, LEVOTHROID) 25 MCG tablet Take 50 mcg by mouth daily.     . Multiple Vitamin (MULTIVITAMIN) capsule Take 1 capsule  by mouth daily.      . pravastatin (PRAVACHOL) 40 MG tablet Take 1 tablet (40 mg total) by mouth at bedtime. NEEDS APPOINTMENT FOR FUTURE REFILLS 30 tablet 0  . tiotropium (SPIRIVA) 18 MCG inhalation capsule Place 1 capsule (18 mcg total) into inhaler and inhale daily. 90 capsule 3  . ALPRAZolam (XANAX) 0.25 MG tablet Take 1 tablet (0.25 mg total) by mouth 3 (three) times daily as needed for sleep or anxiety. (Patient not taking: Reported on 05/15/2016) 30 tablet 0  . buPROPion (WELLBUTRIN) 75 MG tablet Take 1 tablet (75 mg total) by mouth 2 (two) times daily. (Patient not taking: Reported on 03/25/2016) 60 tablet 5  . naproxen sodium (ANAPROX) 220 MG tablet Take 220 mg by mouth daily as needed (pain).    . pantoprazole (PROTONIX) 40 MG tablet Take 1 tablet (40 mg total) by mouth daily before breakfast. (Patient not taking: Reported on 03/25/2016) 30 tablet 12  . predniSONE (DELTASONE) 20 MG tablet Take 1 tablet (20 mg total) by mouth daily with breakfast. (Patient not taking: Reported on 05/15/2016) 7 tablet 0  . torsemide (DEMADEX) 20 MG tablet Take 1 tablet (20 mg total) by mouth once. (Patient not taking: Reported on 05/15/2016) 90 tablet 1   Current Facility-Administered Medications on File Prior to Visit  Medication Dose Route Frequency Provider Last Rate Last Dose  . neomycin-polymyxin-dexameth (MAXITROL) 0.1 % ophth ointment    PRN Tonny Branch, MD  1 application at [id number]    Past Medical History:  Diagnosis Date  . CHF (congestive heart failure) ([place])   . COPD (chronic obstructive pulmonary disease) ([place])    home O2  . Diastolic dysfunction   . History of nuclear stress test 09/23/2009   dipyridamole; normal pattern of perfusion; low risk scan   . History of tobacco abuse   . Hyperlipidemia   . Hypertension   . Hypothyroidism   . Mild depression     Past Surgical History:  Procedure Laterality Date  . CATARACT EXTRACTION W/PHACO  09/08/2012   Procedure: CATARACT EXTRACTION PHACO  AND INTRAOCULAR LENS PLACEMENT (IOC);  Surgeon: Tonny Branch, MD;  Location: [facility];  Service: Ophthalmology;  Laterality: Right;  CDE:19.67  . CATARACT EXTRACTION W/PHACO  09/19/2012   Procedure: CATARACT EXTRACTION PHACO AND INTRAOCULAR LENS PLACEMENT (IOC);  Surgeon: Tonny Branch, MD;  Location: [facility];  Service: Ophthalmology;  Laterality: Left;  CDE:19.83  . COLONOSCOPY  2007   Dr. Gala Romney: internal hemorrhoids, benign polyps  . ESOPHAGOGASTRODUODENOSCOPY  2007   Dr. Gala Romney: normal esophagus, non-critical Schatzki's ring not manipulated, normal small bowel biopsy  . ESOPHAGOGASTRODUODENOSCOPY (EGD) WITH ESOPHAGEAL DILATION N/A 10/16/2013   Dr. Fields:Schatzki's ring at the gastroesophageal junction s/p dilation/MODERATE Erosive gastritis. reactive gastropathy.   Marland Kitchen [place]  . right carpal tunnel release    . TONSILLECTOMY    . TOTAL ABDOMINAL HYSTERECTOMY  1980's  . TRANSTHORACIC ECHOCARDIOGRAM  09/23/2009   EF>55%; trace TR; normal LA & RA size; normal LV & RV size & systolic function     Family History  Problem Relation Age of Onset  . Allergies Father   . Heart disease Mother   . Rheum arthritis Mother   . Hypertension Mother   . Stroke Maternal Grandmother   . Breast cancer Daughter   . Breast cancer Sister   . Cancer Maternal Aunt   . Stroke Sister   . Colon cancer Neg Hx     Social History   Social History  . Marital status: Married    Spouse name: N/A  . Number of children: 2  . Years of education: N/A   Occupational History  . retired Retired    Theme park manager   Social History Main Topics  . Smoking status: Former Smoker    Packs/day: 1.00    Years: 50.00    Types: Cigarettes    Quit date: 09/07/2008  . Smokeless tobacco: Never Used  . Alcohol use No  . Drug use: No  . Sexual activity: Not Asked   Other Topics Concern  . None   Social History Narrative   Originally from [place]. She has lived in [place], [place], [place]. Currently has a dog & also a cat. No  bird or mold exposure. Does have a hot tub but doesn't use it.       Objective:   Physical Exam BP 116/68 (BP Location: Right Arm, Patient Position: Sitting, Cuff Size: Normal)   Pulse (!) 109   Ht 5' (1.524 m)   Wt 110 lb (49.9 kg)   SpO2 92%   BMI 21.48 kg/m  General:  Awake. Alert. No acute distress. Elderly female.  Integument:  Warm & dry. No rash on exposed skin. No bruising. HEENT:  Moist mucus membranes. No oral ulcers. No scleral injection or icterus.  Cardiovascular:  Regular rate. No edema. No appreciable JVD.  Pulmonary:  Symmetrically decreased breath sounds. Otherwise clear to auscultation. Normal work of  breathing on supplemental oxygen . Abdomen: Soft. Normal bowel sounds. Nondistended.  Musculoskeletal:  Normal bulk and tone. No joint deformity or effusion appreciated.  PFT 05/15/16: FVC 0.96 L (41%) FEV1 0.32 L (18%) FEV1/FVC 0.34 FEF 25-75 0.14 L (10%) negative bronchodilator response TLC 5.87 L (131%) RV 222% ERV 32% DLCO uncorrected 19% 07/14/11: FVC 1.51 L (63%) FEV1 0.39 L (21%) FEV1/FVC 0.26 FEF 25-75 0.15 L (8%)  6MWT 04/15/16:  Walked 4.5 Laps / Baseline Sat 98% on 3 L/m / Nadir Sat 91% on 3 L/m at end of 4.5 Laps (oxygen was pulse)  IMAGING CXR PA/LAT 12/27/15 (previously reviewed by me): No parenchymal nodule or opacity appreciated. No pleural effusion or thickening appreciated. Flattening of the diaphragms and of the chest consistent with hyperinflation. Heart normal in size & mediastinum normal in contour.  LD CHEST CT SCREENING 07/17/15 (per radiologist): No pleural fluid. Moderate to severe centrilobular emphysema. Lower lobe predominant bronchial wall thickening. Fluid or secretions within left lower lobe bronchi as well as right lower lobe. No suspicious pulmonary nodule or mass. Volume loss and interstitial thickening involving left lower lobe. Well-circumscribed low-density liver lesions likely cysts noted. Renal cyst also noted on left. T12 hemangioma  noted.  LABS 01/09/16 Alpha-1 antitrypsin: MM (206)  12/30/15 CBC: 15.0/13.1/40.02/3047 BMP: 142/2.9/96/35/29/0.83/117/8.8   09/30/14 ABG on 3 L/m:  7.478 / 44.5 / 150 / 98.9%    Assessment & Plan:  75 y.o. female with very severe COPD with emphysema & chronic hypoxic respiratory failure. Patient's spirometry today shows significant worsening since 2012. Despite this, the patient has excellent control of her underlying COPD and emphysema. She's had no exacerbations since last appointment therefore I do not feel that altering her inhaler or nebulizer regimen is necessary. She is continuing to use oxygen as previously prescribed. I instructed the patient to notify my office if she had any new breathing problems or questions before her next appointment.  1. Very Severe COPD w/ Emphysema: Continuing Breo & Spiriva as prescribed. No changes. 2. Chronic Hypoxic Respiratory Failure: Continuing on oxygen as previously prescribed. 3. Health Maintenance: S/P Prevnar January 2015 & Pneumovax October 2012. Administering high dose influenza vaccine today. 4. Follow-up: Patient to return to clinic in 6 months or sooner if needed.  Sonia Baller Ashok Cordia, M.D. [facility] Pager:  [phone] After 3pm or if no response, call [phone] 3:39 PM 05/15/16

## 2016-05-15 NOTE — Patient Instructions (Signed)
   Continue using your nebulizer and inhaler medications as prescribed.  Call me if you have any new breathing problems before your next appointment.  I will see you back in 6 months or sooner if needed.

## 2016-05-18 LAB — PULMONARY FUNCTION TEST
DL/VA % PRED: 26 %
DL/VA: 1.11 ml/min/mmHg/L
DLCO UNC: 3.76 ml/min/mmHg
DLCO unc % pred: 19 %
FEF 25-75 Post: 0.15 L/sec
FEF 25-75 Pre: 0.14 L/sec
FEF2575-%CHANGE-POST: 3 %
FEF2575-%PRED-POST: 10 %
FEF2575-%Pred-Pre: 10 %
FEV1-%Change-Post: 3 %
FEV1-%PRED-POST: 19 %
FEV1-%PRED-PRE: 18 %
FEV1-POST: 0.33 L
FEV1-PRE: 0.32 L
FEV1FVC-%Change-Post: 0 %
FEV1FVC-%Pred-Pre: 44 %
FEV6-%Change-Post: 8 %
FEV6-%PRED-POST: 41 %
FEV6-%Pred-Pre: 38 %
FEV6-PRE: 0.84 L
FEV6-Post: 0.92 L
FEV6FVC-%CHANGE-POST: 5 %
FEV6FVC-%PRED-PRE: 93 %
FEV6FVC-%Pred-Post: 98 %
FVC-%CHANGE-POST: 4 %
FVC-%PRED-POST: 42 %
FVC-%PRED-PRE: 41 %
FVC-POST: 1 L
POST FEV1/FVC RATIO: 33 %
PRE FEV6/FVC RATIO: 88 %
Post FEV6/FVC ratio: 93 %
Pre FEV1/FVC ratio: 34 %
RV % PRED: 195 %
RV: 4.09 L
TLC % pred: 119 %
TLC: 5.31 L

## 2016-05-28 DIAGNOSIS — S81812S Laceration without foreign body, left lower leg, sequela: Secondary | ICD-10-CM | POA: Diagnosis not present

## 2016-07-17 ENCOUNTER — Ambulatory Visit (HOSPITAL_COMMUNITY)
Admission: RE | Admit: 2016-07-17 | Discharge: 2016-07-17 | Disposition: A | Discharge status: Home / Self Care | Payer: Medicare Other | Source: Ambulatory Visit | Attending: Acute Care | Admitting: Acute Care

## 2016-07-17 DIAGNOSIS — J439 Emphysema, unspecified: Secondary | ICD-10-CM | POA: Diagnosis not present

## 2016-07-17 DIAGNOSIS — K7689 Other specified diseases of liver: Secondary | ICD-10-CM | POA: Diagnosis not present

## 2016-07-17 DIAGNOSIS — I700 Atherosclerosis of aorta: Secondary | ICD-10-CM | POA: Diagnosis not present

## 2016-07-17 DIAGNOSIS — I2510 Atherosclerotic heart disease of native coronary artery without angina pectoris: Secondary | ICD-10-CM | POA: Insufficient documentation

## 2016-07-17 DIAGNOSIS — Z87891 Personal history of nicotine dependence: Secondary | ICD-10-CM

## 2016-07-17 DIAGNOSIS — R911 Solitary pulmonary nodule: Secondary | ICD-10-CM | POA: Diagnosis not present

## 2016-07-17 DIAGNOSIS — Z122 Encounter for screening for malignant neoplasm of respiratory organs: Secondary | ICD-10-CM | POA: Insufficient documentation

## 2016-07-20 ENCOUNTER — Telehealth: Payer: Self-pay | Admitting: Acute Care

## 2016-07-20 NOTE — Telephone Encounter (Signed)
I have attempted to call Ms. [name] the results of her low-dose screening CT. There was no answer and no option to leave a message. We will attempt to contact the patient again 07/21/2016.

## 2016-07-21 ENCOUNTER — Telehealth: Payer: Self-pay | Admitting: Acute Care

## 2016-07-21 DIAGNOSIS — Z87891 Personal history of nicotine dependence: Secondary | ICD-10-CM

## 2016-07-21 NOTE — Telephone Encounter (Signed)
I have called [name] was with the results of her low-dose screening CT. I explained that her scan was read as a lung RADS category 2, indicating nodules that are benign in both appearance and behavior. Recommendation per radiology is for continued annual screening with low-dose CT in November 2018. I told [name] that we would order and schedule her exam for November 2018. I also let her know that I would fax a copy of the results to Dr. Allyn Kenner, her primary care physician, for completeness of her medical record. She verbalized understanding of the above and had no further questions at completion of the call. She does have my contact information in the event that she has any questions or concerns in the future.

## 2016-07-22 DIAGNOSIS — Z6822 Body mass index (BMI) 22.0-22.9, adult: Secondary | ICD-10-CM | POA: Diagnosis not present

## 2016-07-22 DIAGNOSIS — F339 Major depressive disorder, recurrent, unspecified: Secondary | ICD-10-CM | POA: Diagnosis not present

## 2016-08-14 ENCOUNTER — Other Ambulatory Visit: Payer: Self-pay | Admitting: Pulmonary Disease

## 2016-08-14 MED ORDER — ALBUTEROL SULFATE HFA 108 (90 BASE) MCG/ACT IN AERS: 2.0000 | INHALATION_SPRAY | Freq: Four times a day (QID) | RESPIRATORY_TRACT | 1 refills | Status: DC | PRN

## 2016-08-14 MED ORDER — ALBUTEROL SULFATE (2.5 MG/3ML) 0.083% IN NEBU: 2.5000 mg | INHALATION_SOLUTION | Freq: Three times a day (TID) | RESPIRATORY_TRACT | 5 refills | Status: DC

## 2016-08-14 NOTE — Telephone Encounter (Signed)
Received faxed refill request from [facility] for Albuterol neb  This is a [facility] patient Last ov 9.8.17 Refills sent to pharmacy

## 2016-08-14 NOTE — Telephone Encounter (Signed)
Fax from [facility] (store [phone]) received requesting refill on Proair Rx sent

## 2016-08-21 DIAGNOSIS — F331 Major depressive disorder, recurrent, moderate: Secondary | ICD-10-CM | POA: Diagnosis not present

## 2016-10-21 ENCOUNTER — Ambulatory Visit (INDEPENDENT_AMBULATORY_CARE_PROVIDER_SITE_OTHER): Payer: Medicare Other | Admitting: Gastroenterology

## 2016-10-21 ENCOUNTER — Encounter: Payer: Self-pay | Admitting: Gastroenterology

## 2016-10-21 VITALS — BP 141/76 | HR 96 | Temp 97.4°F | Ht 60.0 in | Wt 108.2 lb

## 2016-10-21 DIAGNOSIS — A09 Infectious gastroenteritis and colitis, unspecified: Secondary | ICD-10-CM | POA: Diagnosis not present

## 2016-10-21 DIAGNOSIS — K625 Hemorrhage of anus and rectum: Secondary | ICD-10-CM | POA: Diagnosis not present

## 2016-10-21 DIAGNOSIS — R197 Diarrhea, unspecified: Secondary | ICD-10-CM

## 2016-10-21 NOTE — Patient Instructions (Signed)
1. Please have your labs and stool study done. We will contact you with further recommendations once results received.

## 2016-10-21 NOTE — Assessment & Plan Note (Signed)
76 year old lady with 3 week history of diarrhea, rectal bleeding possibly resolving over the past 1-2 days. She has had intermittent bouts of prolonged diarrhea with extensive workup in 2007, random colon biopsies of the descending colon and rectum benign, small bowel biopsies negative for celiac. She is somewhat apprehensive about pursuing colonoscopy at this time. She states "can we put off a little bit." She will collect stool for GI pathogen panel with next loose bowel movement. Check CBC. Based on findings, further recommendations.

## 2016-10-21 NOTE — Progress Notes (Signed)
Primary Care Physician: Wende Neighbors, MD  Primary Gastroenterologist:  Garfield Cornea, MD   Chief Complaint  Patient presents with  . Diarrhea    x2 weeks, getting better now  . Rectal Bleeding    bright red blood  . Abdominal Pain    better now, had cramps    HPI: [name] is a 76 y.o. female hereFor further evaluation of recent 2-3 week history of diarrhea, rectal bleeding, abdominal cramps. Patient seen September 2016, seen for diarrhea at that time as well. Diarrhea for one month. Stool studies planned but her diarrhea resolved before collected. Since last colonoscopy was May 2007 at which time she had internal hemorrhoids random colon biopsies negative for microscopic colitis, diminutive polyps in the descending colon were hyperplastic. EGD at that time was unremarkable including small bowel biopsies for celiac disease.  The past 2-3 weeks she's had multiple episodes of diarrhea daily, nocturnal symptoms. Fecal incontinence. Abdominal cramping. Fresh blood noted on the tissue, in the toilet. Last antibiotics were fall of 2017. Denies heartburn, dysphagia. Appetite diminished during recent illness. Over the past couple of days she feels like her symptoms are improving, stool consistency improved, less frequent stools. No further bleeding.  She remains apprehensive about pursuing a colonoscopy at this time although she has not declined completely.  Current Outpatient Prescriptions  Medication Sig Dispense Refill  . albuterol (PROAIR HFA) 108 (90 Base) MCG/ACT inhaler Inhale 2 puffs into the lungs every 6 (six) hours as needed. Shortness of breath 3 Inhaler 1  . albuterol (PROVENTIL) (2.5 MG/3ML) 0.083% nebulizer solution Take 3 mLs (2.5 mg total) by nebulization 3 (three) times daily. 360 mL 5  . ALPRAZolam (XANAX) 0.5 MG tablet Take 0.5 mg by mouth at bedtime as needed for anxiety.    Marland Kitchen buPROPion (WELLBUTRIN) 75 MG tablet Take 1 tablet (75 mg total) by mouth 2 (two) times  daily. (Patient taking differently: Take 75 mg by mouth daily. ) 60 tablet 5  . calcium-vitamin D (OSCAL WITH D) 500-200 MG-UNIT tablet Take 1 tablet by mouth daily.    . carboxymethylcellulose (REFRESH PLUS) 0.5 % SOLN 1 drop 3 (three) times daily as needed.    . diltiazem (CARDIZEM) 120 MG tablet Take 1 tablet by mouth daily.  2  . Fluticasone Furoate-Vilanterol (BREO ELLIPTA) 100-25 MCG/INH AEPB Inhale 1 puff into the lungs daily. 3 each 0  . levothyroxine (SYNTHROID, LEVOTHROID) 25 MCG tablet Take 50 mcg by mouth daily.     . Multiple Vitamin (MULTIVITAMIN) capsule Take 1 capsule by mouth daily.      . naproxen sodium (ANAPROX) 220 MG tablet Take 220 mg by mouth daily as needed (pain).    . pravastatin (PRAVACHOL) 40 MG tablet Take 1 tablet (40 mg total) by mouth at bedtime. NEEDS APPOINTMENT FOR FUTURE REFILLS 30 tablet 0  . tiotropium (SPIRIVA) 18 MCG inhalation capsule Place 1 capsule (18 mcg total) into inhaler and inhale daily. 90 capsule 3  . torsemide (DEMADEX) 20 MG tablet Take 1 tablet (20 mg total) by mouth once. (Patient taking differently: Take 20 mg by mouth daily. ) 90 tablet 1   No current facility-administered medications for this visit.    Facility-Administered Medications Ordered in Other Visits  Medication Dose Route Frequency Provider Last Rate Last Dose  . neomycin-polymyxin-dexameth (MAXITROL) 0.1 % ophth ointment    PRN Tonny Branch, MD   1 application at [id number]    Allergies as of 10/21/2016 - Review  Complete 10/21/2016  Allergen Reaction Noted  . Bee venom Swelling 03/25/2016   Past Medical History:  Diagnosis Date  . CHF (congestive heart failure) ([place])   . COPD (chronic obstructive pulmonary disease) ([place])    home O2  . Diastolic dysfunction   . History of nuclear stress test 09/23/2009   dipyridamole; normal pattern of perfusion; low risk scan   . History of tobacco abuse   . Hyperlipidemia   . Hypertension   . Hypothyroidism   . Mild depression  ([place])    Past Surgical History:  Procedure Laterality Date  . CATARACT EXTRACTION W/PHACO  09/08/2012   Procedure: CATARACT EXTRACTION PHACO AND INTRAOCULAR LENS PLACEMENT (IOC);  Surgeon: Tonny Branch, MD;  Location: [facility];  Service: Ophthalmology;  Laterality: Right;  CDE:19.67  . CATARACT EXTRACTION W/PHACO  09/19/2012   Procedure: CATARACT EXTRACTION PHACO AND INTRAOCULAR LENS PLACEMENT (IOC);  Surgeon: Tonny Branch, MD;  Location: [facility];  Service: Ophthalmology;  Laterality: Left;  CDE:19.83  . COLONOSCOPY  2007   Dr. Gala Romney: internal hemorrhoids, benign polyps  . ESOPHAGOGASTRODUODENOSCOPY  2007   Dr. Gala Romney: normal esophagus, non-critical Schatzki's ring not manipulated, normal small bowel biopsy  . ESOPHAGOGASTRODUODENOSCOPY (EGD) WITH ESOPHAGEAL DILATION N/A 10/16/2013   Dr. Fields:Schatzki's ring at the gastroesophageal junction s/p dilation/MODERATE Erosive gastritis. reactive gastropathy.   Marland Kitchen [place]  . right carpal tunnel release    . TONSILLECTOMY    . TOTAL ABDOMINAL HYSTERECTOMY  1980's  . TRANSTHORACIC ECHOCARDIOGRAM  09/23/2009   EF>55%; trace TR; normal LA & RA size; normal LV & RV size & systolic function    Social History  Substance Use Topics  . Smoking status: Former Smoker    Packs/day: 1.00    Years: 50.00    Types: Cigarettes    Quit date: 09/07/2008  . Smokeless tobacco: Never Used  . Alcohol use No   Family History  Problem Relation Age of Onset  . Allergies Father   . Heart disease Mother   . Rheum arthritis Mother   . Hypertension Mother   . Stroke Maternal Grandmother   . Breast cancer Daughter   . Breast cancer Sister   . Cancer Maternal Aunt   . Stroke Sister   . Colon cancer Neg Hx     ROS:  General: Negative for anorexia,   fever, chills, fatigue, weakness. Recent weight loss with acute illness, per patient 5-7 lb. ENT: Negative for hoarseness, difficulty swallowing , nasal congestion. CV: Negative for chest pain, angina,  palpitations, dyspnea on exertion, peripheral edema.  Respiratory: Negative for dyspnea at rest, dyspnea on exertion, cough, sputum, wheezing.  GI: See history of present illness. GU:  Negative for dysuria, hematuria, urinary incontinence, urinary frequency, nocturnal urination.  Endo: Negative for unusual weight change.    Physical Examination:   BP (!) 141/76   Pulse 96   Temp 97.4 F (36.3 C) (Oral)   Ht 5' (1.524 m)   Wt 108 lb 3.2 oz (49.1 kg)   BMI 21.13 kg/m   General: Well-nourished, well-developed in no acute distress.  Eyes: No icterus. Mouth: Oropharyngeal mucosa moist and pink , no lesions erythema or exudate. Lungs: Clear to auscultation bilaterally.  Heart: Regular rate and rhythm, no murmurs rubs or gallops.  Abdomen: Bowel sounds are normal, nontender, nondistended, no hepatosplenomegaly or masses, no abdominal bruits or hernia , no rebound or guarding.   Extremities: No lower extremity edema. No clubbing or deformities. Neuro: Alert and oriented x  4   Skin: Warm and dry, no jaundice.   Psych: Alert and cooperative, normal mood and affect.

## 2016-10-22 ENCOUNTER — Telehealth: Payer: Self-pay

## 2016-10-22 DIAGNOSIS — Z961 Presence of intraocular lens: Secondary | ICD-10-CM | POA: Diagnosis not present

## 2016-10-22 DIAGNOSIS — A09 Infectious gastroenteritis and colitis, unspecified: Secondary | ICD-10-CM | POA: Diagnosis not present

## 2016-10-22 DIAGNOSIS — H524 Presbyopia: Secondary | ICD-10-CM | POA: Diagnosis not present

## 2016-10-22 DIAGNOSIS — K625 Hemorrhage of anus and rectum: Secondary | ICD-10-CM | POA: Diagnosis not present

## 2016-10-22 DIAGNOSIS — H353131 Nonexudative age-related macular degeneration, bilateral, early dry stage: Secondary | ICD-10-CM | POA: Diagnosis not present

## 2016-10-22 LAB — CBC WITH DIFFERENTIAL/PLATELET
BASOS PCT: 1 %
Basophils Absolute: 76 cells/uL (ref 0–200)
EOS PCT: 5 %
Eosinophils Absolute: 380 cells/uL (ref 15–500)
HCT: 36.7 % (ref 35.0–45.0)
Hemoglobin: 12 g/dL (ref 11.7–15.5)
LYMPHS PCT: 29 %
Lymphs Abs: 2204 cells/uL (ref 850–3900)
MCH: 27.8 pg (ref 27.0–33.0)
MCHC: 32.7 g/dL (ref 32.0–36.0)
MCV: 85 fL (ref 80.0–100.0)
MONOS PCT: 15 %
MPV: 10.1 fL (ref 7.5–12.5)
Monocytes Absolute: 1140 cells/uL — ABNORMAL HIGH (ref 200–950)
NEUTROS ABS: 3800 {cells}/uL (ref 1500–7800)
Neutrophils Relative %: 50 %
PLATELETS: 390 10*3/uL (ref 140–400)
RBC: 4.32 MIL/uL (ref 3.80–5.10)
RDW: 13.2 % (ref 11.0–15.0)
WBC: 7.6 10*3/uL (ref 3.8–10.8)

## 2016-10-22 NOTE — Telephone Encounter (Signed)
Noted  

## 2016-10-22 NOTE — Telephone Encounter (Signed)
T/C from [place], pt brought in formed stool. [place] told her to bring another sample when she has the loose stool. She just called [place] for FYI.

## 2016-10-22 NOTE — Progress Notes (Signed)
cc'ed to pcp °

## 2016-10-23 ENCOUNTER — Encounter: Payer: Self-pay | Admitting: Gastroenterology

## 2016-10-26 NOTE — Telephone Encounter (Signed)
Spoke with the pt, she said the sample she took to the lab was mushy with blood in it. The lab threw her sample away. She said she has not had a bm in 2 days, she thinks the diarrhea is gone. She will call back if she develops diarrhea again. She does have some lower, middle abd cramping every once in awhile, but nothing that the pt is worried about.

## 2016-10-26 NOTE — Telephone Encounter (Signed)
It appears that a GI pathogen panel is pending. Did she recollect??? Also, with rectal bleeding we should consider a colonoscopy if she is willing.

## 2016-10-27 NOTE — Telephone Encounter (Signed)
See phone note

## 2016-10-27 NOTE — Progress Notes (Signed)
Normal H/H. See email response.  Is there a GI pathogen really pending? With rectal bleeding, we should consider tcs if she is willing.

## 2016-10-29 NOTE — Progress Notes (Signed)
noted 

## 2016-11-03 ENCOUNTER — Other Ambulatory Visit (HOSPITAL_COMMUNITY): Payer: Self-pay | Admitting: Internal Medicine

## 2016-11-03 DIAGNOSIS — Z1231 Encounter for screening mammogram for malignant neoplasm of breast: Secondary | ICD-10-CM

## 2016-11-09 LAB — GASTROINTESTINAL PATHOGEN PANEL PCR

## 2016-11-11 ENCOUNTER — Ambulatory Visit: Payer: Medicare Other | Admitting: Pulmonary Disease

## 2016-11-18 ENCOUNTER — Other Ambulatory Visit: Payer: Self-pay | Admitting: Gastroenterology

## 2016-11-18 DIAGNOSIS — A09 Infectious gastroenteritis and colitis, unspecified: Secondary | ICD-10-CM | POA: Diagnosis not present

## 2016-11-18 DIAGNOSIS — K625 Hemorrhage of anus and rectum: Secondary | ICD-10-CM | POA: Diagnosis not present

## 2016-11-19 ENCOUNTER — Ambulatory Visit (HOSPITAL_COMMUNITY)
Admission: RE | Admit: 2016-11-19 | Discharge: 2016-11-19 | Disposition: A | Discharge status: Home / Self Care | Payer: Medicare Other | Source: Ambulatory Visit | Attending: Internal Medicine | Admitting: Internal Medicine

## 2016-11-19 DIAGNOSIS — Z1231 Encounter for screening mammogram for malignant neoplasm of breast: Secondary | ICD-10-CM

## 2016-11-19 LAB — GASTROINTESTINAL PATHOGEN PANEL PCR
C. DIFFICILE TOX A/B, PCR: NOT DETECTED
CAMPYLOBACTER, PCR: NOT DETECTED
Cryptosporidium, PCR: NOT DETECTED
E COLI (ETEC) LT/ST, PCR: NOT DETECTED
E COLI 0157, PCR: NOT DETECTED
E coli (STEC) stx1/stx2, PCR: NOT DETECTED
GIARDIA LAMBLIA, PCR: NOT DETECTED
Norovirus, PCR: NOT DETECTED
Rotavirus A, PCR: NOT DETECTED
Salmonella, PCR: NOT DETECTED
Shigella, PCR: NOT DETECTED

## 2016-11-23 ENCOUNTER — Other Ambulatory Visit (HOSPITAL_COMMUNITY): Payer: Self-pay | Admitting: Internal Medicine

## 2016-11-23 DIAGNOSIS — R928 Other abnormal and inconclusive findings on diagnostic imaging of breast: Secondary | ICD-10-CM

## 2016-11-25 NOTE — Progress Notes (Signed)
Please let patient know her gi path panel was negative.  Offer her f/u ov.

## 2016-11-30 ENCOUNTER — Encounter: Payer: Self-pay | Admitting: Gastroenterology

## 2016-11-30 NOTE — Progress Notes (Signed)
APPT MADE AND LETTER SENT  °

## 2016-12-08 ENCOUNTER — Ambulatory Visit (HOSPITAL_COMMUNITY)
Admission: RE | Admit: 2016-12-08 | Discharge: 2016-12-08 | Disposition: A | Discharge status: Home / Self Care | Payer: Medicare Other | Source: Ambulatory Visit | Attending: Internal Medicine | Admitting: Internal Medicine

## 2016-12-08 DIAGNOSIS — R928 Other abnormal and inconclusive findings on diagnostic imaging of breast: Secondary | ICD-10-CM | POA: Diagnosis not present

## 2016-12-10 DIAGNOSIS — R197 Diarrhea, unspecified: Secondary | ICD-10-CM | POA: Diagnosis not present

## 2016-12-10 DIAGNOSIS — Z6821 Body mass index (BMI) 21.0-21.9, adult: Secondary | ICD-10-CM | POA: Diagnosis not present

## 2016-12-10 DIAGNOSIS — R251 Tremor, unspecified: Secondary | ICD-10-CM | POA: Diagnosis not present

## 2016-12-22 ENCOUNTER — Ambulatory Visit: Payer: Medicare Other | Admitting: Gastroenterology

## 2016-12-31 ENCOUNTER — Ambulatory Visit (INDEPENDENT_AMBULATORY_CARE_PROVIDER_SITE_OTHER): Payer: Medicare Other | Admitting: Gastroenterology

## 2016-12-31 ENCOUNTER — Encounter: Payer: Self-pay | Admitting: Gastroenterology

## 2016-12-31 VITALS — BP 151/87 | HR 109 | Temp 97.1°F | Ht 60.0 in | Wt 108.2 lb

## 2016-12-31 DIAGNOSIS — E039 Hypothyroidism, unspecified: Secondary | ICD-10-CM | POA: Diagnosis not present

## 2016-12-31 DIAGNOSIS — R197 Diarrhea, unspecified: Secondary | ICD-10-CM | POA: Diagnosis not present

## 2016-12-31 DIAGNOSIS — K625 Hemorrhage of anus and rectum: Secondary | ICD-10-CM | POA: Diagnosis not present

## 2016-12-31 DIAGNOSIS — D509 Iron deficiency anemia, unspecified: Secondary | ICD-10-CM | POA: Diagnosis not present

## 2016-12-31 NOTE — Progress Notes (Signed)
cc'ed to pcp °

## 2016-12-31 NOTE — Progress Notes (Signed)
Primary Care Physician: Wende Neighbors, MD  Primary Gastroenterologist:    Chief Complaint  Patient presents with  . Diarrhea  . Constipation  . Abdominal Pain    lower mid    HPI: [name] is a 76 y.o. female hereFor follow-up she was seen back in February 2018 for several week history of diarrhea, rectal bleeding, abdominal cramping. Her last colonoscopy May 2007 at which time she had internal hemorrhoids, random colon biopsies negative for microscopic colitis, diminutive polyps in the descending colon were hyperplastic. EGD at that same time unremarkable including small bowel biopsies for celiac disease.GI pathogen panel last month was negative. CBC in February unremarkable.  Patient states that she had done pretty well since February up until yesterday. For the most part she was having regular bowel movements. Yesterday she had 2 hot dogs with chili and last night had some abdominal cramping and loose stools. She had some toilet tissue hematochezia yesterday. Notes the day before she was constipated and strained a bowel movement. Denies vomiting, heartburn, dysphagia. Weight is stable.  Current Outpatient Prescriptions  Medication Sig Dispense Refill  . albuterol (PROAIR HFA) 108 (90 Base) MCG/ACT inhaler Inhale 2 puffs into the lungs every 6 (six) hours as needed. Shortness of breath 3 Inhaler 1  . albuterol (PROVENTIL) (2.5 MG/3ML) 0.083% nebulizer solution Take 3 mLs (2.5 mg total) by nebulization 3 (three) times daily. 360 mL 5  . ALPRAZolam (XANAX) 0.5 MG tablet Take 0.5 mg by mouth at bedtime as needed for anxiety.    Marland Kitchen buPROPion (WELLBUTRIN) 75 MG tablet Take 1 tablet (75 mg total) by mouth 2 (two) times daily. (Patient taking differently: Take 75 mg by mouth daily. ) 60 tablet 5  . calcium-vitamin D (OSCAL WITH D) 500-200 MG-UNIT tablet Take 1 tablet by mouth daily.    . carboxymethylcellulose (REFRESH PLUS) 0.5 % SOLN 1 drop 3 (three) times daily as needed.    .  diltiazem (CARDIZEM) 120 MG tablet Take 1 tablet by mouth daily.  2  . Fluticasone Furoate-Vilanterol (BREO ELLIPTA) 100-25 MCG/INH AEPB Inhale 1 puff into the lungs daily. 3 each 0  . levothyroxine (SYNTHROID, LEVOTHROID) 25 MCG tablet Take 50 mcg by mouth daily.     . Multiple Vitamin (MULTIVITAMIN) capsule Take 1 capsule by mouth daily.      . naproxen sodium (ANAPROX) 220 MG tablet Take 220 mg by mouth daily as needed (pain).    . pravastatin (PRAVACHOL) 40 MG tablet Take 1 tablet (40 mg total) by mouth at bedtime. NEEDS APPOINTMENT FOR FUTURE REFILLS 30 tablet 0  . tiotropium (SPIRIVA) 18 MCG inhalation capsule Place 1 capsule (18 mcg total) into inhaler and inhale daily. 90 capsule 3  . torsemide (DEMADEX) 20 MG tablet Take 1 tablet (20 mg total) by mouth once. (Patient taking differently: Take 20 mg by mouth daily. ) 90 tablet 1   No current facility-administered medications for this visit.    Facility-Administered Medications Ordered in Other Visits  Medication Dose Route Frequency Provider Last Rate Last Dose  . neomycin-polymyxin-dexameth (MAXITROL) 0.1 % ophth ointment    PRN Tonny Branch, MD   1 application at 03/00/92 1227    Allergies as of 12/31/2016 - Review Complete 12/31/2016  Allergen Reaction Noted  . Bee venom Swelling 03/25/2016   Past Medical History:  Diagnosis Date  . CHF (congestive heart failure) ([place])   . COPD (chronic obstructive pulmonary disease) ([place])    home O2  .  Diastolic dysfunction   . History of nuclear stress test 09/23/2009   dipyridamole; normal pattern of perfusion; low risk scan   . History of tobacco abuse   . Hyperlipidemia   . Hypertension   . Hypothyroidism   . Mild depression ([place])    Past Surgical History:  Procedure Laterality Date  . CATARACT EXTRACTION W/PHACO  09/08/2012   Procedure: CATARACT EXTRACTION PHACO AND INTRAOCULAR LENS PLACEMENT (IOC);  Surgeon: Tonny Branch, MD;  Location: [facility];  Service: Ophthalmology;  Laterality:  Right;  CDE:19.67  . CATARACT EXTRACTION W/PHACO  09/19/2012   Procedure: CATARACT EXTRACTION PHACO AND INTRAOCULAR LENS PLACEMENT (IOC);  Surgeon: Tonny Branch, MD;  Location: [facility];  Service: Ophthalmology;  Laterality: Left;  CDE:19.83  . COLONOSCOPY  2007   Dr. Gala Romney: internal hemorrhoids, benign polyps  . ESOPHAGOGASTRODUODENOSCOPY  2007   Dr. Gala Romney: normal esophagus, non-critical Schatzki's ring not manipulated, normal small bowel biopsy  . ESOPHAGOGASTRODUODENOSCOPY (EGD) WITH ESOPHAGEAL DILATION N/A 10/16/2013   Dr. Fields:Schatzki's ring at the gastroesophageal junction s/p dilation/MODERATE Erosive gastritis. reactive gastropathy.   Marland Kitchen [place]  . right carpal tunnel release    . TONSILLECTOMY    . TOTAL ABDOMINAL HYSTERECTOMY  1980's  . TRANSTHORACIC ECHOCARDIOGRAM  09/23/2009   EF>55%; trace TR; normal LA & RA size; normal LV & RV size & systolic function    Family History  Problem Relation Age of Onset  . Allergies Father   . Heart disease Mother   . Rheum arthritis Mother   . Hypertension Mother   . Stroke Maternal Grandmother   . Breast cancer Daughter   . Breast cancer Sister   . Cancer Maternal Aunt   . Stroke Sister   . Colon cancer Neg Hx    Social History   Social History  . Marital status: Married    Spouse name: N/A  . Number of children: 2  . Years of education: N/A   Occupational History  . retired Retired    Theme park manager   Social History Main Topics  . Smoking status: Former Smoker    Packs/day: 1.00    Years: 50.00    Types: Cigarettes    Quit date: 09/07/2008  . Smokeless tobacco: Never Used  . Alcohol use No  . Drug use: No  . Sexual activity: Not Asked   Other Topics Concern  . None   Social History Narrative   Originally from [place]. She has lived in [place], [place], [place]. Currently has a dog & also a cat. No bird or mold exposure. Does have a hot tub but doesn't use it.     ROS:  General: Negative for anorexia, weight loss,  fever, chills, fatigue, weakness. ENT: Negative for hoarseness, difficulty swallowing , nasal congestion. CV: Negative for chest pain, angina, palpitations, dyspnea on exertion, peripheral edema.  Respiratory: Negative for dyspnea at rest, dyspnea on exertion, cough, sputum, wheezing.  GI: See history of present illness. GU:  Negative for dysuria, hematuria, urinary incontinence, urinary frequency, nocturnal urination.  Endo: Negative for unusual weight change.    Physical Examination:   BP (!) 151/87   Pulse (!) 109   Temp 97.1 F (36.2 C) (Oral)   Ht 5' (1.524 m)   Wt 108 lb 3.2 oz (49.1 kg)   BMI 21.13 kg/m   General: Well-nourished, well-developed in no acute distress. Thin elderly female no acute distress. Oxygen via nasal cannula in place. Daughter with her today, Jocelyn Lamer. Eyes: No icterus. Mouth:  Oropharyngeal mucosa moist and pink , no lesions erythema or exudate. Lungs: Clear to auscultation bilaterally.  Heart: Regular rate and rhythm, no murmurs rubs or gallops.  Abdomen: Bowel sounds are normal, minimal lower abdominal tenderness, nondistended, no hepatosplenomegaly or masses, no abdominal bruits or hernia , no rebound or guarding.   Extremities: No lower extremity edema. No clubbing or deformities. Neuro: Alert and oriented x 4   Skin: Warm and dry, no jaundice.   Psych: Alert and cooperative, normal mood and affect.  Labs:  Lab Results  Component Value Date   WBC 7.6 10/22/2016   HGB 12.0 10/22/2016   HCT 36.7 10/22/2016   MCV 85.0 10/22/2016   PLT 390 10/22/2016    Imaging Studies: Mm Diag Breast Tomo Uni Left  Result Date: 12/08/2016 CLINICAL DATA:  Patient returns today to evaluate a possible left breast mass questioned on recent screening mammogram. EXAM: 2D DIGITAL DIAGNOSTIC UNILATERAL LEFT MAMMOGRAM WITH CAD AND ADJUNCT TOMO COMPARISON:  Previous exams including recent screening mammogram dated 11/19/2016. ACR Breast Density Category c: The breast tissue  is heterogeneously dense, which may obscure small masses. FINDINGS: On today's additional views with spot compression and 3D tomosynthesis, there is no persistent abnormality within the left breast indicating superimposition of normal fibroglandular tissues. There are no new dominant masses, suspicious calcifications or secondary signs of malignancy identified in the left breast on today's exam. Mammographic images were processed with CAD. IMPRESSION: No evidence of malignancy. Patient may return to routine annual bilateral screening mammogram schedule. RECOMMENDATION: Screening mammogram in one year.(Code:[id number]-B-01Y) I have discussed the findings and recommendations with the patient. Results were also provided in writing at the conclusion of the visit. If applicable, a reminder letter will be sent to the patient regarding the next appointment. BI-RADS CATEGORY  1: Negative. Electronically Signed   By: Franki Cabot M.D.   On: 12/08/2016 15:32

## 2016-12-31 NOTE — Assessment & Plan Note (Signed)
76 year old lady with history of intermittent bouts of prolonged diarrhea with extensive workup back in 2007 as outlined above who presented back in February with several weeks of diarrhea. GI pathogen panel negative at that time. Her CBC was unremarkable. For the most part she did well up until a couple of days ago. She believes the hotdogs she ate yesterday caused her diarrhea to come back. She's had some lower abdominal cramping. She saw small volume fresh blood on the toilet tissue. Notes 2 days ago she strained to have a bowel movement. Her last colonoscopy was 2007. We discussed today that she could potentially have benign anorectal findings to explain the rectal bleeding, cannot exclude malignancy. Daughter is leaning towards having her have the colonoscopy. Patient reports that she will have the colonoscopy if Dr. Buford Dresser recommended. In the meantime she will keep a stool diary. I will discuss further with Dr. Gala Romney to see what his opinion is regarding a colonoscopy at this point. She did well with conscious sedation in 2015 with similar medications and in the setting of severe COPD.

## 2016-12-31 NOTE — Patient Instructions (Signed)
1. Keep a stool diary for next two weeks charting if you see blood on toilet tissue or in stool AND how many stools daily AND if hard or loose.  2. I will touch base with you in 1-2 weeks and let you know if Dr. Gala Romney recommends a colonoscopy.

## 2017-01-03 NOTE — Progress Notes (Signed)
Please let patient know that RMR recommends colonoscopy for further work up of diarrhea, rectal bleeding.   Schedule colonoscopy (put "with random colon biopsies" in the comment section) with RMR.

## 2017-01-04 ENCOUNTER — Other Ambulatory Visit: Payer: Self-pay

## 2017-01-04 DIAGNOSIS — K625 Hemorrhage of anus and rectum: Secondary | ICD-10-CM

## 2017-01-04 DIAGNOSIS — R197 Diarrhea, unspecified: Secondary | ICD-10-CM

## 2017-01-04 MED ORDER — NA SULFATE-K SULFATE-MG SULF 17.5-3.13-1.6 GM/177ML PO SOLN: 1.0000 | ORAL | 0 refills | Status: DC

## 2017-01-04 NOTE — Progress Notes (Signed)
Called and informed pt. She is ok with scheduling colonoscopy. Colonoscopy with RMR scheduled for 02/04/17 at 12:15pm. Prep rx sent to [facility] in [place]. Instructions mailed to pt. Orders entered.

## 2017-01-05 ENCOUNTER — Telehealth: Payer: Self-pay

## 2017-01-05 NOTE — Telephone Encounter (Signed)
Pt called office. She is scheduled for colonoscopy 02/04/17. Said she don't think she can make it till then. She is still having diarrhea. Some blood at times, but not in the past 2 days. Diarrhea is watery. Sometimes sits on toilet all night and is not sure how to count episodes. Had potato soup today and done ok after eating it. She is weak. She is concerned that she may have Crohn's. No fever. She has taken Kaopectate, Pepto Bismol and GasX with no relief. She is keeping a stool diary. Most occurrences happen during the day. She is afraid to eat. She ate omelette and hashbrowns Sunday afternoon and then had diarrhea that night.  Routing to LSL.

## 2017-01-06 ENCOUNTER — Other Ambulatory Visit: Payer: Self-pay | Admitting: Gastroenterology

## 2017-01-06 ENCOUNTER — Other Ambulatory Visit: Payer: Self-pay

## 2017-01-06 DIAGNOSIS — R197 Diarrhea, unspecified: Secondary | ICD-10-CM

## 2017-01-06 NOTE — Telephone Encounter (Signed)
Frail lady. Intermittent severe diarrhea. Previous GI pathogen panel negative mid-March.  We really should check her electrolytes, for dehydration.  Need: CBC, BMET.  CAMILLE, CAN WE MOVE THIS LADY'S COLONOSCOPY WITH RANDOM COLON BIOPSIES UP CLOSER?

## 2017-01-06 NOTE — Telephone Encounter (Addendum)
[name], reschedule the patient to 01/13/17 at 2:45 and I will call [name] for approval.

## 2017-01-06 NOTE — Telephone Encounter (Signed)
Labs requested from Dr Juel Burrow office

## 2017-01-06 NOTE — Telephone Encounter (Signed)
I spoke with the patient and she stated she doesn't have the strength to make it to the lab today.    I told her we would be moving her procedure up to sometime next week, however we will call her back with a definite date and time.  She stated she had blood work last week at Dr. Juel Burrow office.  Manuela Schwartz, can you get those labs from Dr. Juel Burrow office, please

## 2017-01-06 NOTE — Telephone Encounter (Signed)
Called pt. Colonoscopy with Dr. Gala Romney rescheduled for 01/13/17 at 2:45pm, pt to arrive at 1:45pm. Pt hasn't picked up her prep at [facility] yet. New instructions faxed to [facility] and she is aware to pick-up with her prep. Instructions also reviewed on phone. Pt was very appreciative for rescheduling procedure for her. Called and informed Endo scheduler.

## 2017-01-06 NOTE — Telephone Encounter (Signed)
Lab orders done. Printed labs from [facility] that were done on 12/31/16 with Dr.Hall and place in [place] cart.

## 2017-01-07 NOTE — Telephone Encounter (Signed)
Spoke with the pt, she said this has been going on for about 6 months or so, she has been taking an otc potassium supplement and trying to get more potassium in her diet.

## 2017-01-07 NOTE — Telephone Encounter (Signed)
Labs recently done 12/31/2016 by PCP. Iron saturation 11% low, TIBC 316, iron 35, TSH 0.964, white blood cell count 12,000, hemoglobin 11.7 normal, hematocrit 36.6, platelets 380,000, glucose 111, BUN 14, creatinine 0.87, sodium 144, potassium 3.3, total bilirubin 0.4, alkaline phosphatase 81, AST 15, ALT 17, albumin 4.2   Can we find out whether or not patient was prescribed any potassium by her PCP after last week's labs. Her potassium is somewhat low.

## 2017-01-08 NOTE — Telephone Encounter (Signed)
[name], please call the patient we have rescheduled her procedure to 5/9 at 7:30 am.

## 2017-01-08 NOTE — Telephone Encounter (Signed)
Encourage ongoing potassium supplement and increase dietary consumption.

## 2017-01-11 NOTE — Telephone Encounter (Signed)
Called and informed pt of new time for colonoscopy on 01/13/17. New times given for instructions also.

## 2017-01-13 ENCOUNTER — Encounter (HOSPITAL_COMMUNITY): Payer: Self-pay | Admitting: *Deleted

## 2017-01-13 ENCOUNTER — Encounter (HOSPITAL_COMMUNITY)
Admission: RE | Disposition: A | Discharge status: Home / Self Care | Payer: Self-pay | Source: Ambulatory Visit | Attending: Internal Medicine

## 2017-01-13 ENCOUNTER — Ambulatory Visit (HOSPITAL_COMMUNITY)
Admission: RE | Admit: 2017-01-13 | Discharge: 2017-01-13 | Disposition: A | Discharge status: Home / Self Care | Payer: Medicare Other | Source: Ambulatory Visit | Attending: Internal Medicine | Admitting: Internal Medicine

## 2017-01-13 DIAGNOSIS — Z79899 Other long term (current) drug therapy: Secondary | ICD-10-CM | POA: Diagnosis not present

## 2017-01-13 DIAGNOSIS — Z9981 Dependence on supplemental oxygen: Secondary | ICD-10-CM | POA: Insufficient documentation

## 2017-01-13 DIAGNOSIS — R197 Diarrhea, unspecified: Secondary | ICD-10-CM

## 2017-01-13 DIAGNOSIS — Z91030 Bee allergy status: Secondary | ICD-10-CM | POA: Insufficient documentation

## 2017-01-13 DIAGNOSIS — K529 Noninfective gastroenteritis and colitis, unspecified: Secondary | ICD-10-CM | POA: Diagnosis not present

## 2017-01-13 DIAGNOSIS — I509 Heart failure, unspecified: Secondary | ICD-10-CM | POA: Insufficient documentation

## 2017-01-13 DIAGNOSIS — Z7951 Long term (current) use of inhaled steroids: Secondary | ICD-10-CM | POA: Diagnosis not present

## 2017-01-13 DIAGNOSIS — I110 Hypertensive heart disease with heart failure: Secondary | ICD-10-CM | POA: Diagnosis not present

## 2017-01-13 DIAGNOSIS — Z87891 Personal history of nicotine dependence: Secondary | ICD-10-CM | POA: Diagnosis not present

## 2017-01-13 DIAGNOSIS — K625 Hemorrhage of anus and rectum: Secondary | ICD-10-CM

## 2017-01-13 DIAGNOSIS — E785 Hyperlipidemia, unspecified: Secondary | ICD-10-CM | POA: Diagnosis not present

## 2017-01-13 DIAGNOSIS — E039 Hypothyroidism, unspecified: Secondary | ICD-10-CM | POA: Diagnosis not present

## 2017-01-13 DIAGNOSIS — J449 Chronic obstructive pulmonary disease, unspecified: Secondary | ICD-10-CM | POA: Diagnosis not present

## 2017-01-13 DIAGNOSIS — F329 Major depressive disorder, single episode, unspecified: Secondary | ICD-10-CM | POA: Insufficient documentation

## 2017-01-13 DIAGNOSIS — K5730 Diverticulosis of large intestine without perforation or abscess without bleeding: Secondary | ICD-10-CM

## 2017-01-13 HISTORY — PX: BIOPSY: SHX5522

## 2017-01-13 HISTORY — PX: COLONOSCOPY: SHX5424

## 2017-01-13 HISTORY — DX: Pneumonia, unspecified organism: J18.9

## 2017-01-13 HISTORY — DX: Dyspnea, unspecified: R06.00

## 2017-01-13 SURGERY — COLONOSCOPY
Anesthesia: Moderate Sedation

## 2017-01-13 MED ORDER — MIDAZOLAM HCL 5 MG/5ML IJ SOLN
INTRAMUSCULAR | Status: AC
  Filled 2017-01-13: qty 10

## 2017-01-13 MED ORDER — ONDANSETRON HCL 4 MG/2ML IJ SOLN
INTRAMUSCULAR | Status: DC | PRN
  Administered 2017-01-13: 4 mg via INTRAVENOUS

## 2017-01-13 MED ORDER — ONDANSETRON HCL 4 MG/2ML IJ SOLN
INTRAMUSCULAR | Status: AC
  Filled 2017-01-13: qty 2

## 2017-01-13 MED ORDER — MEPERIDINE HCL 100 MG/ML IJ SOLN
INTRAMUSCULAR | Status: AC
  Filled 2017-01-13: qty 2

## 2017-01-13 MED ORDER — MIDAZOLAM HCL 5 MG/5ML IJ SOLN
INTRAMUSCULAR | Status: DC
Start: 2017-01-13 — End: 2017-01-13
  Filled 2017-01-13: qty 10

## 2017-01-13 MED ORDER — MIDAZOLAM HCL 5 MG/5ML IJ SOLN
INTRAMUSCULAR | Status: DC | PRN
  Administered 2017-01-13: 1 mg via INTRAVENOUS
  Administered 2017-01-13: 2 mg via INTRAVENOUS

## 2017-01-13 MED ORDER — MEPERIDINE HCL 100 MG/ML IJ SOLN
INTRAMUSCULAR | Status: DC | PRN
  Administered 2017-01-13 (×2): 25 mg via INTRAVENOUS

## 2017-01-13 MED ORDER — LIDOCAINE VISCOUS 2 % MT SOLN
OROMUCOSAL | Status: AC
  Filled 2017-01-13: qty 15

## 2017-01-13 MED ORDER — SODIUM CHLORIDE 0.9 % IV SOLN
INTRAVENOUS | Status: DC
  Administered 2017-01-13: 1000 mL via INTRAVENOUS

## 2017-01-13 NOTE — Interval H&P Note (Signed)
History and Physical Interval Note:  01/13/2017 7:33 AM  [name]  has presented today for surgery, with the diagnosis of diarrhea, rectal bleeding  The various methods of treatment have been discussed with the patient and family. After consideration of risks, benefits and other options for treatment, the patient has consented to  Procedure(s) with comments: COLONOSCOPY (N/A) - 12:15pm as a surgical intervention .  The patient's history has been reviewed, patient examined, no change in status, stable for surgery.  I have reviewed the patient's chart and labs.  Questions were answered to the patient's satisfaction.     [name]  No change.The risks, benefits, limitations, alternatives and imponderables have been reviewed with the patient. Questions have been answered. All parties are agreeable.

## 2017-01-13 NOTE — Discharge Instructions (Addendum)
°Colonoscopy °Discharge Instructions ° °Read the instructions outlined below and refer to this sheet in the next few weeks. These discharge instructions provide you with general information on caring for yourself after you leave the hospital. Your doctor may also give you specific instructions. While your treatment has been planned according to the most current medical practices available, unavoidable complications occasionally occur. If you have any problems or questions after discharge, call Dr. Rourk at [phone]. °ACTIVITY °· You may resume your regular activity, but move at a slower pace for the next 24 hours.  °· Take frequent rest periods for the next 24 hours.  °· Walking will help get rid of the air and reduce the bloated feeling in your belly (abdomen).  °· No driving for 24 hours (because of the medicine (anesthesia) used during the test).   °· Do not sign any important legal documents or operate any machinery for 24 hours (because of the anesthesia used during the test).  °NUTRITION °· Drink plenty of fluids.  °· You may resume your normal diet as instructed by your doctor.  °· Begin with a light meal and progress to your normal diet. Heavy or fried foods are harder to digest and may make you feel sick to your stomach (nauseated).  °· Avoid alcoholic beverages for 24 hours or as instructed.  °MEDICATIONS °· You may resume your normal medications unless your doctor tells you otherwise.  °WHAT YOU CAN EXPECT TODAY °· Some feelings of bloating in the abdomen.  °· Passage of more gas than usual.  °· Spotting of blood in your stool or on the toilet paper.  °IF YOU HAD POLYPS REMOVED DURING THE COLONOSCOPY: °· No aspirin products for 7 days or as instructed.  °· No alcohol for 7 days or as instructed.  °· Eat a soft diet for the next 24 hours.  °FINDING OUT THE RESULTS OF YOUR TEST °Not all test results are available during your visit. If your test results are not back during the visit, make an appointment  with your caregiver to find out the results. Do not assume everything is normal if you have not heard from your caregiver or the medical facility. It is important for you to follow up on all of your test results.  °SEEK IMMEDIATE MEDICAL ATTENTION IF: °· You have more than a spotting of blood in your stool.  °· Your belly is swollen (abdominal distention).  °· You are nauseated or vomiting.  °· You have a temperature over 101.  °· You have abdominal pain or discomfort that is severe or gets worse throughout the day.  ° ° °Diverticulosis information provided ° °Further recommendations to follow pending review of pathology report ° ° ° ° °Diverticulosis °Diverticulosis is a condition that develops when small pouches (diverticula) form in the wall of the large intestine (colon). The colon is where water is absorbed and stool is formed. The pouches form when the inside layer of the colon pushes through weak spots in the outer layers of the colon. You may have a few pouches or many of them. °What are the causes? °The cause of this condition is not known. °What increases the risk? °The following factors may make you more likely to develop this condition: °· Being older than age 60. Your risk for this condition increases with age. Diverticulosis is rare among people younger than age 30. By age 80, many people have it. °· Eating a low-fiber diet. °· Having frequent constipation. °· Being overweight. °·   Not getting enough exercise. °· Smoking. °· Taking over-the-counter pain medicines, like aspirin and ibuprofen. °· Having a family history of diverticulosis. ° °What are the signs or symptoms? °In most people, there are no symptoms of this condition. If you do have symptoms, they may include: °· Bloating. °· Cramps in the abdomen. °· Constipation or diarrhea. °· Pain in the lower left side of the abdomen. ° °How is this diagnosed? °This condition is most often diagnosed during an exam for other colon problems. Because  diverticulosis usually has no symptoms, it often cannot be diagnosed independently. This condition may be diagnosed by: °· Using a flexible scope to examine the colon (colonoscopy). °· Taking an X-ray of the colon after dye has been put into the colon (barium enema). °· Doing a CT scan. ° °How is this treated? °You may not need treatment for this condition if you have never developed an infection related to diverticulosis. If you have had an infection before, treatment may include: °· Eating a high-fiber diet. This may include eating more fruits, vegetables, and grains. °· Taking a fiber supplement. °· Taking a live bacteria supplement (probiotic). °· Taking medicine to relax your colon. °· Taking antibiotic medicines. ° °Follow these instructions at home: °· Drink 6-8 glasses of water or more each day to prevent constipation. °· Try not to strain when you have a bowel movement. °· If you have had an infection before: °? Eat more fiber as directed by your health care provider or your diet and nutrition specialist (dietitian). °? Take a fiber supplement or probiotic, if your health care provider approves. °· Take over-the-counter and prescription medicines only as told by your health care provider. °· If you were prescribed an antibiotic, take it as told by your health care provider. Do not stop taking the antibiotic even if you start to feel better. °· Keep all follow-up visits as told by your health care provider. This is important. °Contact a health care provider if: °· You have pain in your abdomen. °· You have bloating. °· You have cramps. °· You have not had a bowel movement in 3 days. °Get help right away if: °· Your pain gets worse. °· Your bloating becomes very bad. °· You have a fever or chills, and your symptoms suddenly get worse. °· You vomit. °· You have bowel movements that are bloody or black. °· You have bleeding from your rectum. °Summary °· Diverticulosis is a condition that develops when small  pouches (diverticula) form in the wall of the large intestine (colon). °· You may have a few pouches or many of them. °· This condition is most often diagnosed during an exam for other colon problems. °· If you have had an infection related to diverticulosis, treatment may include increasing the fiber in your diet, taking supplements, or taking medicines. °This information is not intended to replace advice given to you by your health care provider. Make sure you discuss any questions you have with your health care provider. °Document Released: 05/21/2004 Document Revised: 07/13/2016 Document Reviewed: 07/13/2016 °Elsevier Interactive Patient Education © 2017 [employer] ° °

## 2017-01-13 NOTE — Op Note (Signed)
[facility] Patient Name: [name] Procedure Date: 01/13/2017 7:06 AM MRN: [id number] Date of Birth: 08/16/1941 Attending MD: Norvel Richards , MD CSN: [id number] Age: 76 Admit Type: Outpatient Procedure:                Colonoscopy Indications:              Chronic diarrhea Providers:                Norvel Richards, MD, Janeece Riggers, RN, Lurline Del, RN Referring MD:              Medicines:                Midazolam 3 mg IV, Meperidine 50 mg IV Complications:            No immediate complications. Estimated Blood Loss:     Estimated blood loss was minimal. Procedure:                Pre-Anesthesia Assessment:                           - Prior to the procedure, a History and Physical                            was performed, and patient medications and                            allergies were reviewed. The patient's tolerance of                            previous anesthesia was also reviewed. The risks                            and benefits of the procedure and the sedation                            options and risks were discussed with the patient.                            All questions were answered, and informed consent                            was obtained. Prior Anticoagulants: The patient has                            taken no previous anticoagulant or antiplatelet                            agents. ASA Grade Assessment: III - A patient with                            severe systemic disease. After reviewing the risks  and benefits, the patient was deemed in                            satisfactory condition to undergo the procedure.                           After obtaining informed consent, the colonoscope                            was passed under direct vision. Throughout the                            procedure, the patient's blood pressure, pulse, and                            oxygen saturations  were monitored continuously. The                            EC-[id number] ([id number]) scope was introduced through                            the anus and advanced to the 5 cm into the ileum.                            The terminal ileum, ileocecal valve, appendiceal                            orifice, and rectum were photographed. The entire                            colon was well visualized. The colonoscopy was                            performed without difficulty. The patient tolerated                            the procedure well. The quality of the bowel                            preparation was adequate. Scope In: 7:47:24 AM Scope Out: 8:04:56 AM Scope Withdrawal Time: 0 hours 10 minutes 16 seconds  Total Procedure Duration: 0 hours 17 minutes 32 seconds  Findings:      The perianal and digital rectal examinations were normal.      Scattered small and large-mouthed diverticula were found in the sigmoid       colon.      The exam was otherwise without abnormality on direct and retroflexion       views. The distal 5 cm of terminal ileal mucosa also appeare normal.       Segmental biopsy of the ascending and descending / sigmoid segments to       evaluate for microscopic colitis. Estimated blood loss was minimal. Impression:               - Diverticulosis in the sigmoid colon. Biopsied.                           -  The examination was otherwise normal on direct                            and retroflexion views. Moderate Sedation:      Moderate (conscious) sedation was administered by the endoscopy nurse       and supervised by the endoscopist. The following parameters were       monitored: oxygen saturation, heart rate, blood pressure, respiratory       rate, EKG, adequacy of pulmonary ventilation, and response to care.       Total physician intraservice time was 23 minutes. Recommendation:           - Patient has a contact number available for                            emergencies.  The signs and symptoms of potential                            delayed complications were discussed with the                            patient. Return to normal activities tomorrow.                            Written discharge instructions were provided to the                            patient.                           - Resume previous diet.                           - Continue present medications.                           - Await pathology results.                           - No repeat colonoscopy due to age.                           - Return to GI clinic in 6 weeks. Procedure Code(s):        --- Professional ---                           [phone], Colonoscopy, flexible; with biopsy, single                            or multiple                           99152, Moderate sedation services provided by the                            same physician or other qualified health care  professional performing the diagnostic or                            therapeutic service that the sedation supports,                            requiring the presence of an independent trained                            observer to assist in the monitoring of the                            patient's level of consciousness and physiological                            status; initial 15 minutes of intraservice time,                            patient age 42 years or older                           [phone], Moderate sedation services; each additional                            15 minutes intraservice time Diagnosis Code(s):        --- Professional ---                           K52.9, Noninfective gastroenteritis and colitis,                            unspecified                           K57.30, Diverticulosis of large intestine without                            perforation or abscess without bleeding CPT copyright 2016 [employer]. All rights reserved. The codes  documented in this report are preliminary and upon coder review may  be revised to meet current compliance requirements. [name]. Rourk, MD Norvel Richards, MD 01/13/2017 8:18:44 AM This report has been signed electronically. Number of Addenda: 0

## 2017-01-13 NOTE — H&P (View-Only) (Signed)
Please let patient know that RMR recommends colonoscopy for further work up of diarrhea, rectal bleeding.   Schedule colonoscopy (put "with random colon biopsies" in the comment section) with RMR.

## 2017-01-17 ENCOUNTER — Encounter: Payer: Self-pay | Admitting: Internal Medicine

## 2017-01-20 ENCOUNTER — Encounter (HOSPITAL_COMMUNITY): Payer: Self-pay | Admitting: Internal Medicine

## 2017-02-11 ENCOUNTER — Encounter (HOSPITAL_COMMUNITY): Payer: Self-pay | Admitting: Emergency Medicine

## 2017-02-11 ENCOUNTER — Emergency Department (HOSPITAL_COMMUNITY)
Admission: EM | Admit: 2017-02-11 | Discharge: 2017-02-11 | Disposition: A | Discharge status: Home / Self Care | Payer: Medicare Other | Attending: Emergency Medicine | Admitting: Emergency Medicine

## 2017-02-11 DIAGNOSIS — R58 Hemorrhage, not elsewhere classified: Secondary | ICD-10-CM | POA: Diagnosis not present

## 2017-02-11 DIAGNOSIS — E039 Hypothyroidism, unspecified: Secondary | ICD-10-CM | POA: Insufficient documentation

## 2017-02-11 DIAGNOSIS — Y939 Activity, unspecified: Secondary | ICD-10-CM | POA: Insufficient documentation

## 2017-02-11 DIAGNOSIS — Y999 Unspecified external cause status: Secondary | ICD-10-CM | POA: Diagnosis not present

## 2017-02-11 DIAGNOSIS — J449 Chronic obstructive pulmonary disease, unspecified: Secondary | ICD-10-CM | POA: Insufficient documentation

## 2017-02-11 DIAGNOSIS — W19XXXA Unspecified fall, initial encounter: Secondary | ICD-10-CM | POA: Diagnosis not present

## 2017-02-11 DIAGNOSIS — Y929 Unspecified place or not applicable: Secondary | ICD-10-CM | POA: Insufficient documentation

## 2017-02-11 DIAGNOSIS — I509 Heart failure, unspecified: Secondary | ICD-10-CM | POA: Diagnosis not present

## 2017-02-11 DIAGNOSIS — S8011XA Contusion of right lower leg, initial encounter: Secondary | ICD-10-CM | POA: Diagnosis not present

## 2017-02-11 DIAGNOSIS — Z79899 Other long term (current) drug therapy: Secondary | ICD-10-CM | POA: Diagnosis not present

## 2017-02-11 DIAGNOSIS — Z87891 Personal history of nicotine dependence: Secondary | ICD-10-CM | POA: Insufficient documentation

## 2017-02-11 NOTE — ED Provider Notes (Signed)
76 y/o female - unexplained bruising to the RLE over the anterior distal LE - she has no pain and FROM of the joints Not anticoagulated Stable for d/c Likely mild bruise  no other signs of coagulopathy Pt in agreement  Medical screening examination/treatment/procedure(s) were conducted as a shared visit with non-physician practitioner(s) and myself.  I personally evaluated the patient during the encounter.  Clinical Impression:   Final diagnoses:  Penny Raveling, MD 02/14/17 [phone]

## 2017-02-11 NOTE — ED Triage Notes (Signed)
Patient complaining of large bruise noted to right lower leg x 3 days. Denies injury. Denies pain.

## 2017-02-11 NOTE — Discharge Instructions (Signed)
Return to the ER if needed. The bruising may take several weeks to completely heal.

## 2017-02-13 NOTE — ED Provider Notes (Signed)
[facility] DEPT Provider Note   CSN: [id number] Arrival date & time: 02/11/17  1154     History   Chief Complaint Chief Complaint  Patient presents with  . Bleeding/Bruising    HPI [name] is a 76 y.o. female.  HPI  [name] is a 76 y.o. female who presents to the Emergency Department complaining of non-tender bruise to the right lower leg, noticed 3 days prior to arrival.  She denies anticoagulant or known injury.  Also denies numbness or swelling.    Past Medical History:  Diagnosis Date  . CHF (congestive heart failure) ([place])   . COPD (chronic obstructive pulmonary disease) ([place])    home O2  . Diastolic dysfunction   . Dyspnea   . History of nuclear stress test 09/23/2009   dipyridamole; normal pattern of perfusion; low risk scan   . History of tobacco abuse   . Hyperlipidemia   . Hypertension   . Hypothyroidism   . Mild depression ([place])   . Pneumonia     Patient Active Problem List   Diagnosis Date Noted  . Rectal bleeding 10/21/2016  . Diarrhea 06/06/2015  . Abdominal pain 06/06/2015  . Dysphagia   . Respiratory distress 09/30/2014  . Chronic respiratory failure with hypoxia ([place]) 02/02/2014  . Alopecia 02/02/2014  . Loss of weight 01/24/2014  . Unspecified constipation 01/24/2014  . Esophageal dysphagia 01/24/2014  . COPD [place], frequent exacerbations   . Hypertension   . Hyperlipidemia   . Hypothyroidism   . Mild depression ([place])     Past Surgical History:  Procedure Laterality Date  . BIOPSY  01/13/2017   Procedure: BIOPSY;  Surgeon: Daneil Dolin, MD;  Location: [facility] ENDO SUITE;  Service: Endoscopy;;  right/left/sigmoid colon  . CATARACT EXTRACTION W/PHACO  09/08/2012   Procedure: CATARACT EXTRACTION PHACO AND INTRAOCULAR LENS PLACEMENT (IOC);  Surgeon: Tonny Branch, MD;  Location: [facility] ORS;  Service: Ophthalmology;  Laterality: Right;  CDE:19.67  . CATARACT EXTRACTION W/PHACO  09/19/2012   Procedure: CATARACT EXTRACTION PHACO AND  INTRAOCULAR LENS PLACEMENT (IOC);  Surgeon: Tonny Branch, MD;  Location: [facility] ORS;  Service: Ophthalmology;  Laterality: Left;  CDE:19.83  . COLONOSCOPY  2007   Dr. Gala Romney: internal hemorrhoids, benign polyps  . COLONOSCOPY N/A 01/13/2017   Procedure: COLONOSCOPY;  Surgeon: Daneil Dolin, MD;  Location: [facility] SU[facility];  Service: Endoscopy;  Laterality: N/A;  12:15pm  . ESOPHAGOGASTRODUODENOSCOPY  2007   Dr. Gala Romney: normal esophagus, non-critical Schatzki's ring not manipulated, normal small bowel biopsy  . ESOPHAGOGASTRODUODENOSCOPY (EGD) WITH ESOPHAGEAL DILATION N/A 10/16/2013   Dr. Fields:Schatzki's ring at the gastroesophageal junction s/p dilation/MODERATE Erosive gastritis. reactive gastropathy.   Marland Kitchen [place]  . right carpal tunnel release    . TONSILLECTOMY    . TOTAL ABDOMINAL HYSTERECTOMY  1980's  . TRANSTHORACIC ECHOCARDIOGRAM  09/23/2009   EF>55%; trace TR; normal LA & RA size; normal LV & RV size & systolic function     OB History    No data available       Home Medications    Prior to Admission medications   Medication Sig Start Date End Date Taking? Authorizing Provider  albuterol (PROAIR HFA) 108 (90 Base) MCG/ACT inhaler Inhale 2 puffs into the lungs every 6 (six) hours as needed. Shortness of breath Patient taking differently: Inhale 2 puffs into the lungs every 6 (six) hours as needed for wheezing or shortness of breath. Shortness of breath 08/14/16   Ashok Cordia,  Sonia Baller, MD  albuterol (PROVENTIL) (2.5 MG/3ML) 0.083% nebulizer solution Take 3 mLs (2.5 mg total) by nebulization 3 (three) times daily. 08/14/16   Javier Glazier, MD  ALPRAZolam Duanne Moron) 0.5 MG tablet Take 0.5-0.75 mg by mouth 2 (two) times daily as needed for anxiety.     [provider]  buPROPion (WELLBUTRIN XL) 150 MG 24 hr tablet Take 150 mg by mouth daily.    [provider]  calcium-vitamin D (OSCAL WITH D) 500-200 MG-UNIT tablet Take 1 tablet by mouth daily.    [provider]  carboxymethylcellulose (REFRESH PLUS) 0.5 % SOLN Place 1 drop into both eyes daily as needed (dry eyes).     [provider]  cetirizine (ZYRTEC) 10 MG tablet Take 10 mg by mouth daily as needed for allergies.    [provider]  diltiazem (CARDIZEM) 120 MG tablet Take 120 mg by mouth daily.  12/28/14   [provider]  Fluticasone Furoate-Vilanterol (BREO ELLIPTA) 100-25 MCG/INH AEPB Inhale 1 puff into the lungs daily. 03/19/15   Elsie Stain, MD  levothyroxine (SYNTHROID, LEVOTHROID) 50 MCG tablet Take 50 mcg by mouth daily before breakfast.    [provider]  Multiple Vitamin (MULTIVITAMIN) capsule Take 1 capsule by mouth daily.      [provider]  Na Sulfate-K Sulfate-Mg Sulf (SUPREP BOWEL PREP KIT) 17.5-3.13-1.6 GM/180ML SOLN Take 1 kit by mouth as directed. 01/04/17   Rourk, [name], MD  naproxen sodium (ANAPROX) 220 MG tablet Take 440 mg by mouth daily as needed (pain).     [provider]  pantoprazole (PROTONIX) 40 MG tablet Take 40 mg by mouth daily before breakfast.    [provider]  Potassium 99 MG TABS Take 99 mg by mouth daily.    [provider]  pravastatin (PRAVACHOL) 40 MG tablet Take 1 tablet (40 mg total) by mouth at bedtime. NEEDS APPOINTMENT FOR FUTURE REFILLS 04/25/15   Pixie Casino, MD  predniSONE (DELTASONE) 10 MG tablet Take 10 mg by mouth 4 (four) times daily as needed (COPD Flare up). For 10 days    [provider]  tiotropium (SPIRIVA) 18 MCG inhalation capsule Place 1 capsule (18 mcg total) into inhaler and inhale daily. 10/24/15 01/12/17  Marshell Garfinkel, MD  torsemide (DEMADEX) 20 MG tablet Take 1 tablet (20 mg total) by mouth once. Patient taking differently: Take 20 mg by mouth daily.  10/30/15   Marshell Garfinkel, MD    Family History Family History  Problem Relation Age of Onset  . Allergies Father   . Heart disease Mother   . Rheum arthritis Mother   .  Hypertension Mother   . Stroke Maternal Grandmother   . Breast cancer Daughter   . Breast cancer Sister   . Cancer Maternal Aunt   . Stroke Sister   . Colon cancer Neg Hx     Social History Social History  Substance Use Topics  . Smoking status: Former Smoker    Packs/day: 1.00    Years: 50.00    Types: Cigarettes    Quit date: 09/07/2008  . Smokeless tobacco: Never Used  . Alcohol use No     Allergies   Bee venom   Review of Systems Review of Systems  Constitutional: Negative for chills and fever.  Respiratory: Negative for chest tightness and shortness of breath.   Cardiovascular: Negative for chest pain.  Musculoskeletal: Negative for arthralgias, gait problem, joint swelling and myalgias.  Skin:  Positive for color change. Negative for wound.       Bruise right lower leg  All other systems reviewed and are negative.    Physical Exam Updated Vital Signs BP (!) 189/75 (BP Location: Right Arm)   Pulse 99   Temp 97.8 F (36.6 C) (Oral)   Resp (!) 24   Ht 5' (1.524 m)   Wt 49 kg (108 lb)   SpO2 97%   BMI 21.09 kg/m   Physical Exam  Constitutional: She is oriented to person, place, and time. She appears well-developed and well-nourished. No distress.  HENT:  Head: Normocephalic and atraumatic.  Cardiovascular: Normal rate, regular rhythm and intact distal pulses.   Pulmonary/Chest: Effort normal and breath sounds normal. No respiratory distress.  Pt wearing 2L O2 nasal cannula  Musculoskeletal: She exhibits no edema, tenderness or deformity.  Pt has full ROM of the bilateral LE's, no edema or hematoma  Neurological: She is alert and oriented to person, place, and time. No sensory deficit. She exhibits normal muscle tone. Coordination normal.  Skin: Skin is warm. Capillary refill takes less than 2 seconds. No erythema.  Large, non-tender focal area of ecchymosis of the right lower leg.  No open wounds or edema  Nursing note and vitals reviewed.    ED  Treatments / Results  Labs (all labs ordered are listed, but only abnormal results are displayed) Labs Reviewed - No data to display  EKG  EKG Interpretation None       Radiology No results found.  Procedures Procedures (including critical care time)  Medications Ordered in ED Medications - No data to display   Initial Impression / Assessment and Plan / ED Course  I have reviewed the triage vital signs and the nursing notes.  Pertinent labs & imaging results that were available during my care of the patient were reviewed by me and considered in my medical decision making (see chart for details).     Pt with likely bruise right LE. No pain or edema.  PT also seen by Dr. Sabra Heck and care plan discussed.   Final Clinical Impressions(s) / ED Diagnoses   Final diagnoses:  Ecchymosis    New Prescriptions Discharge Medication List as of 02/11/2017 12:53 PM       Kem Parkinson, PA-C 02/13/17 2052    Noemi Chapel, MD 02/14/17 [phone]

## 2017-02-23 ENCOUNTER — Ambulatory Visit: Payer: Medicare Other | Admitting: Gastroenterology

## 2017-02-26 ENCOUNTER — Encounter: Payer: Self-pay | Admitting: Pulmonary Disease

## 2017-02-26 ENCOUNTER — Ambulatory Visit (INDEPENDENT_AMBULATORY_CARE_PROVIDER_SITE_OTHER): Payer: Medicare Other | Admitting: Pulmonary Disease

## 2017-02-26 VITALS — BP 138/82 | HR 98 | Ht 60.0 in | Wt 107.6 lb

## 2017-02-26 DIAGNOSIS — B370 Candidal stomatitis: Secondary | ICD-10-CM | POA: Insufficient documentation

## 2017-02-26 DIAGNOSIS — J449 Chronic obstructive pulmonary disease, unspecified: Secondary | ICD-10-CM

## 2017-02-26 DIAGNOSIS — J9611 Chronic respiratory failure with hypoxia: Secondary | ICD-10-CM

## 2017-02-26 MED ORDER — TIOTROPIUM BROMIDE MONOHYDRATE 18 MCG IN CAPS: 18.0000 ug | ORAL_CAPSULE | Freq: Every day | RESPIRATORY_TRACT | 3 refills | Status: AC

## 2017-02-26 MED ORDER — NYSTATIN 100000 UNIT/ML MT SUSP: OROMUCOSAL | 0 refills | Status: DC

## 2017-02-26 NOTE — Patient Instructions (Signed)
   Stop using your Scope Mouthwash because it is likely contributing to the thrush/white film on your tongue.  Use the mouth wash we are prescribing today to help remove the thrush.  We are keeping you on your current inhaler regimen.  You can stop using your daily nebulizer therapy. You can use your nebulizer medication just like your rescue inhaler:  As needed for any coughing, wheezing, or increased shortness of breath.  Call me if you have any questions or concerns before your next appointment.

## 2017-02-26 NOTE — Progress Notes (Signed)
Subjective:    Patient ID: [name], female    DOB: 16-Apr-1941, 76 y.o.   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  HPI Very severe COPD with emphysema: Prescribed Breo and Spiriva. No exacerbations since last appointment. Denies any new breathing problems. No coughing or wheezing. She reports she is compliant with her inhaler regimen. She reports she uses her nebulizer machine 3 times daily as a matter of habit. She has missed it for a day without a significant difference in her symptoms.   Chronic hypoxic respiratory failure: Prescribed oxygen at 3 L/m pulse dose with exertion and at rest. She is using her oxygen as prescribed.   Review of Systems No chest pain, pressure, or tightness. No fever or chills. No nausea. She is having significant stomach discomfort in the last few months. Did undergo a colonoscopy. Previously was having diarrhea and cramping.   Allergies  Allergen Reactions  . Bee Venom Swelling    Current Outpatient Prescriptions on File Prior to Visit  Medication Sig Dispense Refill  . albuterol (PROAIR HFA) 108 (90 Base) MCG/ACT inhaler Inhale 2 puffs into the lungs every 6 (six) hours as needed. Shortness of breath (Patient taking differently: Inhale 2 puffs into the lungs every 6 (six) hours as needed for wheezing or shortness of breath. Shortness of breath) 3 Inhaler 1  . albuterol (PROVENTIL) (2.5 MG/3ML) 0.083% nebulizer solution Take 3 mLs (2.5 mg total) by nebulization 3 (three) times daily. 360 mL 5  . ALPRAZolam (XANAX) 0.5 MG tablet Take 0.5-0.75 mg by mouth 2 (two) times daily as needed for anxiety.     Marland Kitchen buPROPion (WELLBUTRIN XL) 150 MG 24 hr tablet Take 150 mg by mouth daily.    . calcium-vitamin D (OSCAL WITH D) 500-200 MG-UNIT tablet Take 1 tablet by mouth daily.    . carboxymethylcellulose (REFRESH PLUS) 0.5 % SOLN Place 1 drop into both eyes daily as needed (dry eyes).     . cetirizine  (ZYRTEC) 10 MG tablet Take 10 mg by mouth daily as needed for allergies.    Marland Kitchen diltiazem (CARDIZEM) 120 MG tablet Take 120 mg by mouth daily.   2  . Fluticasone Furoate-Vilanterol (BREO ELLIPTA) 100-25 MCG/INH AEPB Inhale 1 puff into the lungs daily. 3 each 0  . levothyroxine (SYNTHROID, LEVOTHROID) 50 MCG tablet Take 50 mcg by mouth daily before breakfast.    . Multiple Vitamin (MULTIVITAMIN) capsule Take 1 capsule by mouth daily.      . Na Sulfate-K Sulfate-Mg Sulf (SUPREP BOWEL PREP KIT) 17.5-3.13-1.6 GM/180ML SOLN Take 1 kit by mouth as directed. 1 Bottle 0  . naproxen sodium (ANAPROX) 220 MG tablet Take 440 mg by mouth daily as needed (pain).     . pantoprazole (PROTONIX) 40 MG tablet Take 40 mg by mouth daily before breakfast.    . Potassium 99 MG TABS Take 99 mg by mouth daily.    . pravastatin (PRAVACHOL) 40 MG tablet Take 1 tablet (40 mg total) by mouth at bedtime. NEEDS APPOINTMENT FOR FUTURE REFILLS 30 tablet 0  . predniSONE (DELTASONE) 10 MG tablet Take 10 mg by mouth 4 (four) times daily as needed (COPD Flare up). For 10 days    . torsemide (DEMADEX) 20 MG tablet Take 1 tablet (20 mg total) by mouth once. (Patient taking differently: Take 20 mg by mouth daily. ) 90 tablet 1   Current Facility-Administered Medications on File Prior to Visit  Medication Dose Route Frequency Provider Last Rate Last Dose  . neomycin-polymyxin-dexameth (MAXITROL) 0.1 % ophth ointment    PRN Tonny Branch, MD   1 application at 33/00/76 1227    Past Medical History:  Diagnosis Date  . CHF (congestive heart failure) ([place])   . COPD (chronic obstructive pulmonary disease) ([place])    home O2  . Diastolic dysfunction   . Dyspnea   . History of nuclear stress test 09/23/2009   dipyridamole; normal pattern of perfusion; low risk scan   . History of tobacco abuse   . Hyperlipidemia   . Hypertension   . Hypothyroidism   . Mild depression ([place])   . Pneumonia     Past Surgical History:  Procedure Laterality  Date  . BIOPSY  01/13/2017   Procedure: BIOPSY;  Surgeon: Daneil Dolin, MD;  Location: [facility];  Service: Endoscopy;;  right/left/sigmoid colon  . CATARACT EXTRACTION W/PHACO  09/08/2012   Procedure: CATARACT EXTRACTION PHACO AND INTRAOCULAR LENS PLACEMENT (IOC);  Surgeon: Tonny Branch, MD;  Location: [facility];  Service: Ophthalmology;  Laterality: Right;  CDE:19.67  . CATARACT EXTRACTION W/PHACO  09/19/2012   Procedure: CATARACT EXTRACTION PHACO AND INTRAOCULAR LENS PLACEMENT (IOC);  Surgeon: Tonny Branch, MD;  Location: [facility];  Service: Ophthalmology;  Laterality: Left;  CDE:19.83  . COLONOSCOPY  2007   Dr. Gala Romney: internal hemorrhoids, benign polyps  . COLONOSCOPY N/A 01/13/2017   Procedure: COLONOSCOPY;  Surgeon: Daneil Dolin, MD;  Location: [facility];  Service: Endoscopy;  Laterality: N/A;  12:15pm  . ESOPHAGOGASTRODUODENOSCOPY  2007   Dr. Gala Romney: normal esophagus, non-critical Schatzki's ring not manipulated, normal small bowel biopsy  . ESOPHAGOGASTRODUODENOSCOPY (EGD) WITH ESOPHAGEAL DILATION N/A 10/16/2013   Dr. Fields:Schatzki's ring at the gastroesophageal junction s/p dilation/MODERATE Erosive gastritis. reactive gastropathy.   Marland Kitchen Coker  . right carpal tunnel release    . TONSILLECTOMY    . TOTAL ABDOMINAL HYSTERECTOMY  1980's  . TRANSTHORACIC ECHOCARDIOGRAM  09/23/2009   EF>55%; trace TR; normal LA & RA size; normal LV & RV size & systolic function     Family History  Problem Relation Age of Onset  . Allergies Father   . Heart disease Mother   . Rheum arthritis Mother   . Hypertension Mother   . Stroke Maternal Grandmother   . Breast cancer Daughter   . Breast cancer Sister   . Cancer Maternal Aunt   . Stroke Sister   . Colon cancer Neg Hx     Social History   Social History  . Marital status: Married    Spouse name: N/A  . Number of children: 2  . Years of education: N/A   Occupational History  . retired Retired    Theme park manager   Social  History Main Topics  . Smoking status: Former Smoker    Packs/day: 1.00    Years: 50.00    Types: Cigarettes    Quit date: 09/07/2008  . Smokeless tobacco: Never Used  . Alcohol use No  . Drug use: No  . Sexual activity: Not Asked   Other Topics Concern  . None   Social History Narrative   Originally from [place]. She has lived in [place], [place], [place]. Currently has a dog & also a cat. No bird or mold exposure. Does have a hot tub but doesn't use it.       Objective:   Physical Exam BP 138/82 (BP Location: Left Arm, Cuff Size: Normal)  Pulse 98   Ht 5' (1.524 m)   Wt 107 lb 9.6 oz (48.8 kg)   SpO2 96%   BMI 21.01 kg/m   General:  Awake. Alert. No distress.  Integument:  Warm & dry. No rash on exposed skin. Extremities:  No cyanosis or clubbing.  HEENT:  Moist mucus membranes. No nasal turbinate swelling. Thrush present on time. Cardiovascular:  Regular rate. No edema. Normal S1 & S2.  Pulmonary: Symmetrically decreased aeration. Otherwise clear to auscultation. No accessory muscle use on supplemental oxygen. Abdomen: Soft. Normal bowel sounds. Nondistended.  Musculoskeletal:  Normal bulk and tone. No joint deformity or effusion appreciated.  PFT 05/15/16: FVC 0.96 L (41%) FEV1 0.32 L (18%) FEV1/FVC 0.34 FEF 25-75 0.14 L (10%) negative bronchodilator response TLC 5.87 L (131%) RV 222% ERV 32% DLCO uncorrected 19% 07/14/11: FVC 1.51 L (63%) FEV1 0.39 L (21%) FEV1/FVC 0.26 FEF 25-75 0.15 L (8%)  6MWT 04/15/16:  Walked 4.5 Laps / Baseline Sat 98% on 3 L/m / Nadir Sat 91% on 3 L/m at end of 4.5 Laps (oxygen was pulse)  IMAGING CXR PA/LAT 12/27/15 (previously reviewed by me): No parenchymal nodule or opacity appreciated. No pleural effusion or thickening appreciated. Flattening of the diaphragms and of the chest consistent with hyperinflation. Heart normal in size & mediastinum normal in contour.  LD CHEST CT SCREENING 07/17/15 (per radiologist): No pleural fluid. Moderate to severe  centrilobular emphysema. Lower lobe predominant bronchial wall thickening. Fluid or secretions within left lower lobe bronchi as well as right lower lobe. No suspicious pulmonary nodule or mass. Volume loss and interstitial thickening involving left lower lobe. Well-circumscribed low-density liver lesions likely cysts noted. Renal cyst also noted on left. T12 hemangioma noted.  LABS 01/09/16 Alpha-1 antitrypsin: MM (206)  12/30/15 CBC: 15.0/13.1/40.02/3047 BMP: 142/2.9/96/35/29/0.83/117/8.8   09/30/14 ABG on 3 L/m:  7.478 / 44.5 / 150 / 98.9%    Assessment & Plan:  76 y.o. female with very severe COPD & chronic hypoxic respiratory failure. Patient also has underlying emphysema on imaging.Overall patient's COPD seems to be very well controlled and treated with her current inhaler regimen. I do not believe that she needs her daily nebulizer regimen as noted above. Patient was educated on appropriate frequency and use of her rescue medications. I believe her oral thrush is coming from alcohol-containing mouthwash which she is using daily. I instructed the patient to contact my office if he had any new breathing problems or questions before her next appointment.   1. Very severe COPD with emphysema:  Continuing Breo & Spiriva. Patient educated to use her nebulizer treatments and albuterol inhaler on an as-needed basis. 2. Chronic hypoxic respiratory Failure: Continuing on oxygen as previously prescribed at 3 L/m pulse dose with portable oxygen concentrator. 3. Oral thrush: Patient counseled on appropriate oral hygiene & to avoid alcohol-containing mouthwashes. Prescribing nystatin swish and swallow. 4. Health maintenance: Status post Prevnar January 2015 & Pneumovax October 2012. Recommended influenza vaccine in the fall. 5. Follow-up: Return to clinic in 1 year or sooner if needed.  Sonia Baller Ashok Cordia, M.D. [facility] Pager:  [phone] After 3pm or if no response, call  [phone] 12:25 PM 02/26/17

## 2017-03-04 ENCOUNTER — Other Ambulatory Visit: Payer: Self-pay

## 2017-03-04 MED ORDER — ALBUTEROL SULFATE HFA 108 (90 BASE) MCG/ACT IN AERS: 2.0000 | INHALATION_SPRAY | Freq: Four times a day (QID) | RESPIRATORY_TRACT | 1 refills | Status: DC | PRN

## 2017-03-09 ENCOUNTER — Telehealth: Payer: Self-pay | Admitting: Pulmonary Disease

## 2017-03-09 NOTE — Telephone Encounter (Signed)
Spoke with [facility]. Refill was phoned in. Nothing further needed.

## 2017-03-12 ENCOUNTER — Ambulatory Visit: Payer: Medicare Other | Admitting: Gastroenterology

## 2017-04-05 ENCOUNTER — Other Ambulatory Visit: Payer: Self-pay

## 2017-04-29 DIAGNOSIS — H353131 Nonexudative age-related macular degeneration, bilateral, early dry stage: Secondary | ICD-10-CM | POA: Diagnosis not present

## 2017-04-29 DIAGNOSIS — H04123 Dry eye syndrome of bilateral lacrimal glands: Secondary | ICD-10-CM | POA: Diagnosis not present

## 2017-04-29 DIAGNOSIS — Z961 Presence of intraocular lens: Secondary | ICD-10-CM | POA: Diagnosis not present

## 2017-04-29 DIAGNOSIS — H524 Presbyopia: Secondary | ICD-10-CM | POA: Diagnosis not present

## 2017-05-13 ENCOUNTER — Telehealth: Payer: Self-pay | Admitting: Internal Medicine

## 2017-05-13 NOTE — Telephone Encounter (Signed)
Pt called to make OV and asked if she could get a stool container to have on hand when she is able to collect a specimen. [phone]

## 2017-05-13 NOTE — Telephone Encounter (Signed)
I spoke with the pt, she still has diarrhea at times but will be constipated at times. She said she is fine right now, but will have diarrhea for a whole day and then be fine for 2 weeks. She was constipated and strained to have a bm last weekend and saw some blood in her stool afterward. She feels like it may be hemorrhoids. No recent abx, no meds have changed and her diet is the same. She has ov in October and wanted to know if she needed to do a stool sample the next time she had diarrhea?

## 2017-05-14 ENCOUNTER — Other Ambulatory Visit: Payer: Self-pay | Admitting: Gastroenterology

## 2017-05-14 ENCOUNTER — Other Ambulatory Visit: Payer: Self-pay

## 2017-05-14 DIAGNOSIS — R197 Diarrhea, unspecified: Secondary | ICD-10-CM

## 2017-05-14 NOTE — Telephone Encounter (Signed)
Pt is aware. Lab order done and I have mailed the container to her.

## 2017-05-14 NOTE — Telephone Encounter (Signed)
You can give her orders for GI path panel if diarrhea more than 24 hours.

## 2017-06-28 ENCOUNTER — Telehealth: Payer: Self-pay | Admitting: Gastroenterology

## 2017-06-28 ENCOUNTER — Ambulatory Visit: Payer: Medicare Other | Admitting: Gastroenterology

## 2017-06-28 ENCOUNTER — Encounter: Payer: Self-pay | Admitting: Gastroenterology

## 2017-06-28 NOTE — Telephone Encounter (Signed)
Patient was a no show and letter sent  °

## 2017-07-08 DIAGNOSIS — F5101 Primary insomnia: Secondary | ICD-10-CM | POA: Diagnosis not present

## 2017-07-19 ENCOUNTER — Ambulatory Visit (HOSPITAL_COMMUNITY)
Admission: RE | Admit: 2017-07-19 | Discharge: 2017-07-19 | Disposition: A | Discharge status: Home / Self Care | Payer: Medicare Other | Source: Ambulatory Visit | Attending: Acute Care | Admitting: Acute Care

## 2017-07-19 DIAGNOSIS — Z87891 Personal history of nicotine dependence: Secondary | ICD-10-CM | POA: Diagnosis not present

## 2017-07-19 DIAGNOSIS — I700 Atherosclerosis of aorta: Secondary | ICD-10-CM | POA: Diagnosis not present

## 2017-07-19 DIAGNOSIS — J439 Emphysema, unspecified: Secondary | ICD-10-CM | POA: Insufficient documentation

## 2017-07-23 ENCOUNTER — Other Ambulatory Visit: Payer: Self-pay | Admitting: Acute Care

## 2017-07-23 DIAGNOSIS — Z122 Encounter for screening for malignant neoplasm of respiratory organs: Secondary | ICD-10-CM

## 2017-07-23 DIAGNOSIS — Z87891 Personal history of nicotine dependence: Secondary | ICD-10-CM

## 2017-08-24 ENCOUNTER — Ambulatory Visit: Payer: Medicare Other | Admitting: Internal Medicine

## 2017-11-04 ENCOUNTER — Other Ambulatory Visit (HOSPITAL_COMMUNITY): Payer: Self-pay | Admitting: Internal Medicine

## 2017-11-04 DIAGNOSIS — Z1231 Encounter for screening mammogram for malignant neoplasm of breast: Secondary | ICD-10-CM

## 2017-12-16 ENCOUNTER — Ambulatory Visit (HOSPITAL_COMMUNITY): Payer: Medicare Other

## 2017-12-20 ENCOUNTER — Ambulatory Visit (HOSPITAL_COMMUNITY): Payer: Medicare Other

## 2018-01-03 ENCOUNTER — Ambulatory Visit (HOSPITAL_COMMUNITY): Payer: Medicare Other

## 2018-01-10 ENCOUNTER — Ambulatory Visit (HOSPITAL_COMMUNITY): Payer: Medicare Other

## 2018-01-12 ENCOUNTER — Ambulatory Visit (HOSPITAL_COMMUNITY)
Admission: RE | Admit: 2018-01-12 | Discharge: 2018-01-12 | Disposition: A | Discharge status: Home / Self Care | Payer: Medicare Other | Source: Ambulatory Visit | Attending: Internal Medicine | Admitting: Internal Medicine

## 2018-01-12 DIAGNOSIS — Z1231 Encounter for screening mammogram for malignant neoplasm of breast: Secondary | ICD-10-CM | POA: Diagnosis not present

## 2018-01-20 DIAGNOSIS — E782 Mixed hyperlipidemia: Secondary | ICD-10-CM | POA: Diagnosis not present

## 2018-01-20 DIAGNOSIS — I10 Essential (primary) hypertension: Secondary | ICD-10-CM | POA: Diagnosis not present

## 2018-01-20 DIAGNOSIS — D649 Anemia, unspecified: Secondary | ICD-10-CM | POA: Diagnosis not present

## 2018-01-20 DIAGNOSIS — D509 Iron deficiency anemia, unspecified: Secondary | ICD-10-CM | POA: Diagnosis not present

## 2018-01-27 DIAGNOSIS — S3210XA Unspecified fracture of sacrum, initial encounter for closed fracture: Secondary | ICD-10-CM | POA: Diagnosis not present

## 2018-02-07 ENCOUNTER — Other Ambulatory Visit (HOSPITAL_COMMUNITY): Payer: Self-pay | Admitting: Preventative Medicine

## 2018-02-07 DIAGNOSIS — M545 Low back pain: Secondary | ICD-10-CM

## 2018-02-07 DIAGNOSIS — M8588 Other specified disorders of bone density and structure, other site: Secondary | ICD-10-CM

## 2018-02-07 DIAGNOSIS — W19XXXA Unspecified fall, initial encounter: Secondary | ICD-10-CM

## 2018-02-08 DIAGNOSIS — D649 Anemia, unspecified: Secondary | ICD-10-CM | POA: Diagnosis not present

## 2018-02-08 DIAGNOSIS — I4891 Unspecified atrial fibrillation: Secondary | ICD-10-CM | POA: Diagnosis not present

## 2018-02-08 DIAGNOSIS — J449 Chronic obstructive pulmonary disease, unspecified: Secondary | ICD-10-CM | POA: Diagnosis not present

## 2018-02-08 DIAGNOSIS — N183 Chronic kidney disease, stage 3 (moderate): Secondary | ICD-10-CM | POA: Diagnosis not present

## 2018-02-08 DIAGNOSIS — E039 Hypothyroidism, unspecified: Secondary | ICD-10-CM | POA: Diagnosis not present

## 2018-02-08 DIAGNOSIS — F419 Anxiety disorder, unspecified: Secondary | ICD-10-CM | POA: Diagnosis not present

## 2018-02-08 DIAGNOSIS — G4700 Insomnia, unspecified: Secondary | ICD-10-CM | POA: Diagnosis not present

## 2018-02-08 DIAGNOSIS — F331 Major depressive disorder, recurrent, moderate: Secondary | ICD-10-CM | POA: Diagnosis not present

## 2018-02-08 DIAGNOSIS — Z6821 Body mass index (BMI) 21.0-21.9, adult: Secondary | ICD-10-CM | POA: Diagnosis not present

## 2018-02-08 DIAGNOSIS — Z0000 Encounter for general adult medical examination without abnormal findings: Secondary | ICD-10-CM | POA: Diagnosis not present

## 2018-02-08 DIAGNOSIS — E782 Mixed hyperlipidemia: Secondary | ICD-10-CM | POA: Diagnosis not present

## 2018-02-09 DIAGNOSIS — M5386 Other specified dorsopathies, lumbar region: Secondary | ICD-10-CM | POA: Diagnosis not present

## 2018-02-09 DIAGNOSIS — M5430 Sciatica, unspecified side: Secondary | ICD-10-CM | POA: Diagnosis not present

## 2018-02-09 DIAGNOSIS — M8448XA Pathological fracture, other site, initial encounter for fracture: Secondary | ICD-10-CM | POA: Diagnosis not present

## 2018-02-09 DIAGNOSIS — M47816 Spondylosis without myelopathy or radiculopathy, lumbar region: Secondary | ICD-10-CM | POA: Diagnosis not present

## 2018-02-09 DIAGNOSIS — K4090 Unilateral inguinal hernia, without obstruction or gangrene, not specified as recurrent: Secondary | ICD-10-CM | POA: Diagnosis not present

## 2018-02-09 DIAGNOSIS — M5126 Other intervertebral disc displacement, lumbar region: Secondary | ICD-10-CM | POA: Diagnosis not present

## 2018-02-09 DIAGNOSIS — M8589 Other specified disorders of bone density and structure, multiple sites: Secondary | ICD-10-CM | POA: Diagnosis not present

## 2018-02-10 DIAGNOSIS — S322XXA Fracture of coccyx, initial encounter for closed fracture: Secondary | ICD-10-CM | POA: Diagnosis not present

## 2018-02-24 DIAGNOSIS — M533 Sacrococcygeal disorders, not elsewhere classified: Secondary | ICD-10-CM | POA: Diagnosis not present

## 2018-02-24 DIAGNOSIS — F419 Anxiety disorder, unspecified: Secondary | ICD-10-CM | POA: Diagnosis not present

## 2018-02-24 DIAGNOSIS — J449 Chronic obstructive pulmonary disease, unspecified: Secondary | ICD-10-CM | POA: Diagnosis not present

## 2018-02-24 DIAGNOSIS — G4700 Insomnia, unspecified: Secondary | ICD-10-CM | POA: Diagnosis not present

## 2018-02-24 DIAGNOSIS — E039 Hypothyroidism, unspecified: Secondary | ICD-10-CM | POA: Diagnosis not present

## 2018-02-24 DIAGNOSIS — F331 Major depressive disorder, recurrent, moderate: Secondary | ICD-10-CM | POA: Diagnosis not present

## 2018-02-24 DIAGNOSIS — M545 Low back pain: Secondary | ICD-10-CM | POA: Diagnosis not present

## 2018-02-24 DIAGNOSIS — I4891 Unspecified atrial fibrillation: Secondary | ICD-10-CM | POA: Diagnosis not present

## 2018-02-26 ENCOUNTER — Emergency Department (HOSPITAL_COMMUNITY): Payer: Medicare Other

## 2018-02-26 ENCOUNTER — Observation Stay (HOSPITAL_COMMUNITY)
Admission: EM | Admit: 2018-02-26 | Discharge: 2018-02-28 | Disposition: A | Discharge status: Home / Self Care | Payer: Medicare Other | Attending: Family Medicine | Admitting: Family Medicine

## 2018-02-26 ENCOUNTER — Other Ambulatory Visit: Payer: Self-pay

## 2018-02-26 ENCOUNTER — Encounter (HOSPITAL_COMMUNITY): Payer: Self-pay | Admitting: Cardiology

## 2018-02-26 ENCOUNTER — Observation Stay (HOSPITAL_COMMUNITY): Payer: Medicare Other

## 2018-02-26 DIAGNOSIS — E039 Hypothyroidism, unspecified: Secondary | ICD-10-CM | POA: Insufficient documentation

## 2018-02-26 DIAGNOSIS — Y92009 Unspecified place in unspecified non-institutional (private) residence as the place of occurrence of the external cause: Secondary | ICD-10-CM

## 2018-02-26 DIAGNOSIS — J449 Chronic obstructive pulmonary disease, unspecified: Secondary | ICD-10-CM | POA: Diagnosis not present

## 2018-02-26 DIAGNOSIS — J9611 Chronic respiratory failure with hypoxia: Secondary | ICD-10-CM | POA: Diagnosis present

## 2018-02-26 DIAGNOSIS — G319 Degenerative disease of nervous system, unspecified: Secondary | ICD-10-CM | POA: Diagnosis not present

## 2018-02-26 DIAGNOSIS — Z91030 Bee allergy status: Secondary | ICD-10-CM | POA: Insufficient documentation

## 2018-02-26 DIAGNOSIS — S79911A Unspecified injury of right hip, initial encounter: Secondary | ICD-10-CM | POA: Diagnosis not present

## 2018-02-26 DIAGNOSIS — M47816 Spondylosis without myelopathy or radiculopathy, lumbar region: Secondary | ICD-10-CM | POA: Diagnosis not present

## 2018-02-26 DIAGNOSIS — Z8249 Family history of ischemic heart disease and other diseases of the circulatory system: Secondary | ICD-10-CM | POA: Diagnosis not present

## 2018-02-26 DIAGNOSIS — Z7951 Long term (current) use of inhaled steroids: Secondary | ICD-10-CM | POA: Diagnosis not present

## 2018-02-26 DIAGNOSIS — M48061 Spinal stenosis, lumbar region without neurogenic claudication: Secondary | ICD-10-CM | POA: Insufficient documentation

## 2018-02-26 DIAGNOSIS — M4316 Spondylolisthesis, lumbar region: Secondary | ICD-10-CM | POA: Diagnosis not present

## 2018-02-26 DIAGNOSIS — R531 Weakness: Principal | ICD-10-CM | POA: Insufficient documentation

## 2018-02-26 DIAGNOSIS — R29898 Other symptoms and signs involving the musculoskeletal system: Secondary | ICD-10-CM | POA: Diagnosis present

## 2018-02-26 DIAGNOSIS — Z9889 Other specified postprocedural states: Secondary | ICD-10-CM | POA: Insufficient documentation

## 2018-02-26 DIAGNOSIS — Z90710 Acquired absence of both cervix and uterus: Secondary | ICD-10-CM | POA: Insufficient documentation

## 2018-02-26 DIAGNOSIS — Z79890 Hormone replacement therapy (postmenopausal): Secondary | ICD-10-CM | POA: Diagnosis not present

## 2018-02-26 DIAGNOSIS — M25551 Pain in right hip: Secondary | ICD-10-CM | POA: Diagnosis not present

## 2018-02-26 DIAGNOSIS — F329 Major depressive disorder, single episode, unspecified: Secondary | ICD-10-CM | POA: Insufficient documentation

## 2018-02-26 DIAGNOSIS — I110 Hypertensive heart disease with heart failure: Secondary | ICD-10-CM | POA: Insufficient documentation

## 2018-02-26 DIAGNOSIS — Z87891 Personal history of nicotine dependence: Secondary | ICD-10-CM | POA: Diagnosis not present

## 2018-02-26 DIAGNOSIS — M5136 Other intervertebral disc degeneration, lumbar region: Secondary | ICD-10-CM | POA: Diagnosis not present

## 2018-02-26 DIAGNOSIS — Z9981 Dependence on supplemental oxygen: Secondary | ICD-10-CM | POA: Diagnosis not present

## 2018-02-26 DIAGNOSIS — I5032 Chronic diastolic (congestive) heart failure: Secondary | ICD-10-CM | POA: Diagnosis not present

## 2018-02-26 DIAGNOSIS — Z79899 Other long term (current) drug therapy: Secondary | ICD-10-CM | POA: Insufficient documentation

## 2018-02-26 DIAGNOSIS — M545 Low back pain: Secondary | ICD-10-CM | POA: Diagnosis not present

## 2018-02-26 DIAGNOSIS — E785 Hyperlipidemia, unspecified: Secondary | ICD-10-CM | POA: Insufficient documentation

## 2018-02-26 DIAGNOSIS — B370 Candidal stomatitis: Secondary | ICD-10-CM | POA: Diagnosis present

## 2018-02-26 DIAGNOSIS — M5126 Other intervertebral disc displacement, lumbar region: Secondary | ICD-10-CM | POA: Diagnosis not present

## 2018-02-26 DIAGNOSIS — Z823 Family history of stroke: Secondary | ICD-10-CM | POA: Diagnosis not present

## 2018-02-26 DIAGNOSIS — Z9842 Cataract extraction status, left eye: Secondary | ICD-10-CM | POA: Insufficient documentation

## 2018-02-26 DIAGNOSIS — I6782 Cerebral ischemia: Secondary | ICD-10-CM | POA: Diagnosis not present

## 2018-02-26 DIAGNOSIS — S3210XA Unspecified fracture of sacrum, initial encounter for closed fracture: Secondary | ICD-10-CM | POA: Diagnosis not present

## 2018-02-26 DIAGNOSIS — Z9841 Cataract extraction status, right eye: Secondary | ICD-10-CM | POA: Insufficient documentation

## 2018-02-26 DIAGNOSIS — I10 Essential (primary) hypertension: Secondary | ICD-10-CM | POA: Diagnosis present

## 2018-02-26 DIAGNOSIS — S3992XA Unspecified injury of lower back, initial encounter: Secondary | ICD-10-CM | POA: Diagnosis not present

## 2018-02-26 DIAGNOSIS — F32A Depression, unspecified: Secondary | ICD-10-CM

## 2018-02-26 DIAGNOSIS — W19XXXA Unspecified fall, initial encounter: Secondary | ICD-10-CM | POA: Diagnosis not present

## 2018-02-26 LAB — URINALYSIS, ROUTINE W REFLEX MICROSCOPIC
BILIRUBIN URINE: NEGATIVE
Glucose, UA: NEGATIVE mg/dL
HGB URINE DIPSTICK: NEGATIVE
KETONES UR: NEGATIVE mg/dL
Leukocytes, UA: NEGATIVE
NITRITE: NEGATIVE
PH: 6 (ref 5.0–8.0)
Protein, ur: NEGATIVE mg/dL
Specific Gravity, Urine: 1.012 (ref 1.005–1.030)

## 2018-02-26 LAB — CBC WITH DIFFERENTIAL/PLATELET
Basophils Absolute: 0 10*3/uL (ref 0.0–0.1)
Basophils Relative: 0 %
Eosinophils Absolute: 0.5 10*3/uL (ref 0.0–0.7)
Eosinophils Relative: 7 %
HCT: 37.4 % (ref 36.0–46.0)
Hemoglobin: 11.5 g/dL — ABNORMAL LOW (ref 12.0–15.0)
LYMPHS ABS: 1.2 10*3/uL (ref 0.7–4.0)
LYMPHS PCT: 17 %
MCH: 27.8 pg (ref 26.0–34.0)
MCHC: 30.7 g/dL (ref 30.0–36.0)
MCV: 90.6 fL (ref 78.0–100.0)
Monocytes Absolute: 0.7 10*3/uL (ref 0.1–1.0)
Monocytes Relative: 11 %
NEUTROS ABS: 4.5 10*3/uL (ref 1.7–7.7)
Neutrophils Relative %: 65 %
Platelets: 265 10*3/uL (ref 150–400)
RBC: 4.13 MIL/uL (ref 3.87–5.11)
RDW: 13.3 % (ref 11.5–15.5)
WBC: 6.9 10*3/uL (ref 4.0–10.5)

## 2018-02-26 LAB — COMPREHENSIVE METABOLIC PANEL
ALK PHOS: 73 U/L (ref 38–126)
ALT: 15 U/L (ref 14–54)
AST: 16 U/L (ref 15–41)
Albumin: 3.8 g/dL (ref 3.5–5.0)
Anion gap: 9 (ref 5–15)
BUN: 18 mg/dL (ref 6–20)
CO2: 31 mmol/L (ref 22–32)
CREATININE: 0.92 mg/dL (ref 0.44–1.00)
Calcium: 8.9 mg/dL (ref 8.9–10.3)
Chloride: 105 mmol/L (ref 101–111)
GFR calc non Af Amer: 59 mL/min — ABNORMAL LOW (ref >60)
Glucose, Bld: 90 mg/dL (ref 65–99)
Potassium: 3.7 mmol/L (ref 3.5–5.1)
SODIUM: 145 mmol/L (ref 135–145)
Total Bilirubin: 0.9 mg/dL (ref 0.3–1.2)
Total Protein: 6.3 g/dL — ABNORMAL LOW (ref 6.5–8.1)

## 2018-02-26 MED ORDER — PRAVASTATIN SODIUM 40 MG PO TABS
40.0000 mg | ORAL_TABLET | Freq: Every day | ORAL | Status: DC
  Administered 2018-02-26 – 2018-02-27 (×2): 40 mg via ORAL
  Filled 2018-02-26 (×2): qty 1

## 2018-02-26 MED ORDER — PANTOPRAZOLE SODIUM 40 MG PO TBEC
40.0000 mg | DELAYED_RELEASE_TABLET | Freq: Every day | ORAL | Status: DC
  Administered 2018-02-27 – 2018-02-28 (×2): 40 mg via ORAL
  Filled 2018-02-26 (×2): qty 1

## 2018-02-26 MED ORDER — TORSEMIDE 20 MG PO TABS
20.0000 mg | ORAL_TABLET | Freq: Every day | ORAL | Status: DC
  Administered 2018-02-27 – 2018-02-28 (×2): 20 mg via ORAL
  Filled 2018-02-26 (×2): qty 1

## 2018-02-26 MED ORDER — CARBOXYMETHYLCELLULOSE SODIUM 0.5 % OP SOLN: 1.0000 [drp] | Freq: Every day | OPHTHALMIC | Status: DC | PRN

## 2018-02-26 MED ORDER — ALPRAZOLAM 0.5 MG PO TABS: 0.5000 mg | ORAL_TABLET | Freq: Two times a day (BID) | ORAL | Status: DC | PRN

## 2018-02-26 MED ORDER — ACETAMINOPHEN 650 MG RE SUPP: 650.0000 mg | Freq: Four times a day (QID) | RECTAL | Status: DC | PRN

## 2018-02-26 MED ORDER — LEVOTHYROXINE SODIUM 50 MCG PO TABS
50.0000 ug | ORAL_TABLET | Freq: Every day | ORAL | Status: DC
  Administered 2018-02-27 – 2018-02-28 (×2): 50 ug via ORAL
  Filled 2018-02-26 (×2): qty 1

## 2018-02-26 MED ORDER — NYSTATIN 100000 UNIT/ML MT SUSP
5.0000 mL | Freq: Four times a day (QID) | OROMUCOSAL | Status: DC
  Administered 2018-02-26 – 2018-02-28 (×7): 500000 [IU] via ORAL
  Filled 2018-02-26 (×7): qty 5

## 2018-02-26 MED ORDER — ALBUTEROL SULFATE HFA 108 (90 BASE) MCG/ACT IN AERS: 2.0000 | INHALATION_SPRAY | Freq: Four times a day (QID) | RESPIRATORY_TRACT | Status: DC | PRN

## 2018-02-26 MED ORDER — DILTIAZEM HCL 60 MG PO TABS
120.0000 mg | ORAL_TABLET | Freq: Every day | ORAL | Status: DC
  Administered 2018-02-27 – 2018-02-28 (×2): 120 mg via ORAL
  Filled 2018-02-26 (×2): qty 2

## 2018-02-26 MED ORDER — BUPROPION HCL ER (XL) 150 MG PO TB24
150.0000 mg | ORAL_TABLET | Freq: Every day | ORAL | Status: DC
  Administered 2018-02-27 – 2018-02-28 (×2): 150 mg via ORAL
  Filled 2018-02-26 (×3): qty 1

## 2018-02-26 MED ORDER — FLUTICASONE FUROATE-VILANTEROL 100-25 MCG/INH IN AEPB
1.0000 | INHALATION_SPRAY | Freq: Every day | RESPIRATORY_TRACT | Status: DC
  Administered 2018-02-27 – 2018-02-28 (×2): 1 via RESPIRATORY_TRACT
  Filled 2018-02-26: qty 28

## 2018-02-26 MED ORDER — LORATADINE 10 MG PO TABS: 10.0000 mg | ORAL_TABLET | Freq: Every day | ORAL | Status: DC | PRN

## 2018-02-26 MED ORDER — ALBUTEROL SULFATE (2.5 MG/3ML) 0.083% IN NEBU: 2.5000 mg | INHALATION_SOLUTION | Freq: Four times a day (QID) | RESPIRATORY_TRACT | Status: DC | PRN

## 2018-02-26 MED ORDER — ONDANSETRON HCL 4 MG/2ML IJ SOLN: 4.0000 mg | Freq: Four times a day (QID) | INTRAMUSCULAR | Status: DC | PRN

## 2018-02-26 MED ORDER — NAPROXEN SODIUM 220 MG PO TABS: 440.0000 mg | ORAL_TABLET | Freq: Every day | ORAL | Status: DC | PRN

## 2018-02-26 MED ORDER — ONDANSETRON HCL 4 MG PO TABS: 4.0000 mg | ORAL_TABLET | Freq: Four times a day (QID) | ORAL | Status: DC | PRN

## 2018-02-26 MED ORDER — METHYLPREDNISOLONE SODIUM SUCC 125 MG IJ SOLR
125.0000 mg | Freq: Once | INTRAMUSCULAR | Status: AC
  Administered 2018-02-26: 125 mg via INTRAVENOUS
  Filled 2018-02-26: qty 2

## 2018-02-26 MED ORDER — ACETAMINOPHEN 325 MG PO TABS: 650.0000 mg | ORAL_TABLET | Freq: Four times a day (QID) | ORAL | Status: DC | PRN

## 2018-02-26 MED ORDER — ENOXAPARIN SODIUM 40 MG/0.4ML ~~LOC~~ SOLN
40.0000 mg | SUBCUTANEOUS | Status: DC
  Administered 2018-02-26 – 2018-02-27 (×2): 40 mg via SUBCUTANEOUS
  Filled 2018-02-26 (×2): qty 0.4

## 2018-02-26 MED ORDER — TIOTROPIUM BROMIDE MONOHYDRATE 18 MCG IN CAPS
18.0000 ug | ORAL_CAPSULE | Freq: Every day | RESPIRATORY_TRACT | Status: DC
  Administered 2018-02-27 – 2018-02-28 (×2): 18 ug via RESPIRATORY_TRACT
  Filled 2018-02-26: qty 5

## 2018-02-26 NOTE — ED Notes (Signed)
Pt transported to radiology.

## 2018-02-26 NOTE — ED Triage Notes (Addendum)
Weakness times 3-4 days. Fall this week and started taking tramadol for hip pain.  Recently had strength of xanax changed.  And started citalopram.

## 2018-02-26 NOTE — H&P (Signed)
History and Physical  [name]:[id number] DOB: 1941-08-26 DOA: 02/26/2018  Referring physician: Dr Roderic Palau, ED physician PCP: [name], MD  Outpatient Specialists:   Patient Coming From: home  Chief Complaint: weakness, falling  HPI: [name] is a 77 y.o. female with a history of COPD with chronic respiratory failure on home O2, CHF with chronic diastolic dysfunction, hypertension, hypothyroidism, mild depression, hyperlipidemia.  Patient seen for progressive weakness that started 4 days and go and has been getting acutely worse.  Her weakness is to the point where she is unable to support her own weight.  She did have a fall a few weeks ago: She rolled out of bed at home and fell on her back did have some back pain and continues to have some mild residual lower lumbar back pain without radiation.  She denies any other focal weaknesses.  Her symptoms are bilateral, but she states they are worse than the left.  No other palliating or provoking factors.  Emergency Department Course: X-ray of back is normal.  CT of the head is normal.  Labs relatively normal.  EDP discussed patient with neurosurgery, who would like the patient admitted to the medicine, patient sent to [facility] so they can consult on the patient, and an MRI of her back.  Review of Systems:   Pt denies any fevers, chills, nausea, vomiting, diarrhea, constipation, abdominal pain, shortness of breath, dyspnea on exertion, orthopnea, cough, wheezing, palpitations, headache, vision changes, lightheadedness, dizziness, melena, rectal bleeding.  Review of systems are otherwise negative  Past Medical History:  Diagnosis Date  . CHF (congestive heart failure) ([place])   . COPD (chronic obstructive pulmonary disease) ([place])    home O2  . Diastolic dysfunction   . Dyspnea   . History of nuclear stress test 09/23/2009   dipyridamole; normal pattern of perfusion; low risk scan   . History of tobacco abuse   .  Hyperlipidemia   . Hypertension   . Hypothyroidism   . Mild depression ([place])   . Pneumonia    Past Surgical History:  Procedure Laterality Date  . BIOPSY  01/13/2017   Procedure: BIOPSY;  Surgeon: Daneil Dolin, MD;  Location: [facility];  Service: Endoscopy;;  right/left/sigmoid colon  . CATARACT EXTRACTION W/PHACO  09/08/2012   Procedure: CATARACT EXTRACTION PHACO AND INTRAOCULAR LENS PLACEMENT (IOC);  Surgeon: Tonny Branch, MD;  Location: [facility];  Service: Ophthalmology;  Laterality: Right;  CDE:19.67  . CATARACT EXTRACTION W/PHACO  09/19/2012   Procedure: CATARACT EXTRACTION PHACO AND INTRAOCULAR LENS PLACEMENT (IOC);  Surgeon: Tonny Branch, MD;  Location: [facility];  Service: Ophthalmology;  Laterality: Left;  CDE:19.83  . COLONOSCOPY  2007   Dr. Gala Romney: internal hemorrhoids, benign polyps  . COLONOSCOPY N/A 01/13/2017   Procedure: COLONOSCOPY;  Surgeon: Daneil Dolin, MD;  Location: [facility];  Service: Endoscopy;  Laterality: N/A;  12:15pm  . ESOPHAGOGASTRODUODENOSCOPY  2007   Dr. Gala Romney: normal esophagus, non-critical Schatzki's ring not manipulated, normal small bowel biopsy  . ESOPHAGOGASTRODUODENOSCOPY (EGD) WITH ESOPHAGEAL DILATION N/A 10/16/2013   Dr. Fields:Schatzki's ring at the gastroesophageal junction s/p dilation/MODERATE Erosive gastritis. reactive gastropathy.   Marland Kitchen [place]  . right carpal tunnel release    . TONSILLECTOMY    . TOTAL ABDOMINAL HYSTERECTOMY  1980's  . TRANSTHORACIC ECHOCARDIOGRAM  09/23/2009   EF>55%; trace TR; normal LA & RA size; normal LV & RV size & systolic function    Social History:  reports that she quit smoking about 9 years ago. Her smoking use included cigarettes. She has a 50.00 pack-year smoking history. She has never used smokeless tobacco. She reports that she does not drink alcohol or use drugs. Patient lives at home  Allergies  Allergen Reactions  . Bee Venom Swelling    Family History  Problem Relation Age of Onset    . Allergies Father   . Heart disease Mother   . Rheum arthritis Mother   . Hypertension Mother   . Stroke Maternal Grandmother   . Breast cancer Daughter   . Breast cancer Sister   . Cancer Maternal Aunt   . Stroke Sister   . Colon cancer Neg Hx       Prior to Admission medications   Medication Sig Start Date End Date Taking? Authorizing Provider  albuterol (PROAIR HFA) 108 (90 Base) MCG/ACT inhaler Inhale 2 puffs into the lungs every 6 (six) hours as needed for wheezing or shortness of breath. Shortness of breath 03/04/17   Javier Glazier, MD  albuterol (PROVENTIL) (2.5 MG/3ML) 0.083% nebulizer solution Take 3 mLs (2.5 mg total) by nebulization 3 (three) times daily. 08/14/16   Javier Glazier, MD  ALPRAZolam Duanne Moron) 0.5 MG tablet Take 0.5-0.75 mg by mouth 2 (two) times daily as needed for anxiety.     [provider]  buPROPion (WELLBUTRIN XL) 150 MG 24 hr tablet Take 150 mg by mouth daily.    [provider]  calcium-vitamin D (OSCAL WITH D) 500-200 MG-UNIT tablet Take 1 tablet by mouth daily.    [provider]  carboxymethylcellulose (REFRESH PLUS) 0.5 % SOLN Place 1 drop into both eyes daily as needed (dry eyes).     [provider]  cetirizine (ZYRTEC) 10 MG tablet Take 10 mg by mouth daily as needed for allergies.    [provider]  diltiazem (CARDIZEM) 120 MG tablet Take 120 mg by mouth daily.  12/28/14   [provider]  Fluticasone Furoate-Vilanterol (BREO ELLIPTA) 100-25 MCG/INH AEPB Inhale 1 puff into the lungs daily. 03/19/15   Elsie Stain, MD  levothyroxine (SYNTHROID, LEVOTHROID) 50 MCG tablet Take 50 mcg by mouth daily before breakfast.    [provider]  Multiple Vitamin (MULTIVITAMIN) capsule Take 1 capsule by mouth daily.      [provider]  Na Sulfate-K Sulfate-Mg Sulf (SUPREP BOWEL PREP KIT) 17.5-3.13-1.6 GM/180ML SOLN Take 1 kit by mouth as directed. 01/04/17   Rourk, [name], MD   naproxen sodium (ANAPROX) 220 MG tablet Take 440 mg by mouth daily as needed (pain).     [provider]  nystatin (MYCOSTATIN) 100000 UNIT/ML suspension 36m QID swish and swallow 02/26/17   NJavier Glazier MD  pantoprazole (PROTONIX) 40 MG tablet Take 40 mg by mouth daily before breakfast.    [provider]  Potassium 99 MG TABS Take 99 mg by mouth daily.    [provider]  pravastatin (PRAVACHOL) 40 MG tablet Take 1 tablet (40 mg total) by mouth at bedtime. NEEDS APPOINTMENT FOR FUTURE REFILLS 04/25/15   HPixie Casino MD  predniSONE (DELTASONE) 10 MG tablet Take 10 mg by mouth 4 (four) times daily as needed (COPD Flare up). For 10 days    [provider]  tiotropium (SPIRIVA) 18 MCG inhalation capsule Place 1 capsule (18 mcg total) into inhaler and inhale daily. 02/26/17 05/18/18  NJavier Glazier MD  torsemide (DEMADEX) 20 MG tablet Take 1 tablet (20  mg total) by mouth once. Patient taking differently: Take 20 mg by mouth daily.  10/30/15   Marshell Garfinkel, MD    Physical Exam: BP (!) 179/83   Pulse 86   Temp 97.8 F (36.6 C) (Oral)   Resp 18   Ht 5' (1.524 m)   Wt 49.9 kg (110 lb)   SpO2 100%   BMI 21.48 kg/m   . General: Elderly Caucasian female. Awake and alert and oriented x3. No acute cardiopulmonary distress.  Marland Kitchen HEENT: Normocephalic atraumatic.  Right and left ears normal in appearance.  Pupils equal, round, reactive to light. Extraocular muscles are intact. Sclerae anicteric and noninjected.  Moist mucosal membranes. No mucosal lesions.  . Neck: Neck supple without lymphadenopathy. No carotid bruits. No masses palpated.  . Cardiovascular: Regular rate with normal S1-S2 sounds. No murmurs, rubs, gallops auscultated. No JVD.  Marland Kitchen Respiratory: Good respiratory effort with no wheezes, rales, rhonchi. Lungs clear to auscultation bilaterally.  No accessory muscle use. . Abdomen: Soft, nontender, nondistended. Active bowel sounds. No masses  or hepatosplenomegaly  . Skin: No rashes, lesions, or ulcerations.  Dry, warm to touch. 2+ dorsalis pedis and radial pulses. . Musculoskeletal: Tenderness to spinous process of L5.  No calf or leg pain. All major joints not erythematous nontender.  No upper or lower joint deformation.  Good ROM.  No contractures  . Psychiatric: Intact judgment and insight. Pleasant and cooperative. . Neurologic: Hamstring weakness bilaterally (L5, S1). Strength is otherwise 5/5 and symmetric in upper and lower extremities.  Sensation intact to light touch. Cranial nerves II through XII are grossly intact.           Labs on Admission: I have personally reviewed following labs and imaging studies  CBC: Recent Labs  Lab 02/26/18 0857  WBC 6.9  NEUTROABS 4.5  HGB 11.5*  HCT 37.4  MCV 90.6  PLT 976   Basic Metabolic Panel: Recent Labs  Lab 02/26/18 0857  NA 145  K 3.7  CL 105  CO2 31  GLUCOSE 90  BUN 18  CREATININE 0.92  CALCIUM 8.9   GFR: Estimated Creatinine Clearance: 37.4 mL/min (by C-G formula based on SCr of 0.92 mg/dL). Liver Function Tests: Recent Labs  Lab 02/26/18 0857  AST 16  ALT 15  ALKPHOS 73  BILITOT 0.9  PROT 6.3*  ALBUMIN 3.8   No results for input(s): LIPASE, AMYLASE in the last 168 hours. No results for input(s): AMMONIA in the last 168 hours. Coagulation Profile: No results for input(s): INR, PROTIME in the last 168 hours. Cardiac Enzymes: No results for input(s): CKTOTAL, CKMB, CKMBINDEX, TROPONINI in the last 168 hours. BNP (last 3 results) No results for input(s): PROBNP in the last 8760 hours. HbA1C: No results for input(s): HGBA1C in the last 72 hours. CBG: No results for input(s): GLUCAP in the last 168 hours. Lipid Profile: No results for input(s): CHOL, HDL, LDLCALC, TRIG, CHOLHDL, LDLDIRECT in the last 72 hours. Thyroid Function Tests: No results for input(s): TSH, T4TOTAL, FREET4, T3FREE, THYROIDAB in the last 72 hours. Anemia Panel: No results  for input(s): VITAMINB12, FOLATE, FERRITIN, TIBC, IRON, RETICCTPCT in the last 72 hours. Urine analysis:    Component Value Date/Time   COLORURINE YELLOW 02/26/2018 [place] 02/26/2018 1057   LABSPEC 1.012 02/26/2018 1057   PHURINE 6.0 02/26/2018 1057   GLUCOSEU NEGATIVE 02/26/2018 1057   HGBUR NEGATIVE 02/26/2018 1057   BILIRUBINUR NEGATIVE 02/26/2018 [place] 02/26/2018 1057  PROTEINUR NEGATIVE 02/26/2018 1057   UROBILINOGEN 0.2 09/06/2007 2240   NITRITE NEGATIVE 02/26/2018 1057   LEUKOCYTESUR NEGATIVE 02/26/2018 1057   Sepsis Labs: '@LABRCNTIP' (procalcitonin:4,lacticidven:4) )No results found for this or any previous visit (from the past 240 hour(s)).   Radiological Exams on Admission: Dg Lumbar Spine Complete  Result Date: 02/26/2018 CLINICAL DATA:  Lower back pain after fall a week ago. EXAM: LUMBAR SPINE - COMPLETE 4+ VIEW COMPARISON:  Lumbar spine x-rays dated December 18, 2013. FINDINGS: Five lumbar type vertebral bodies. No acute fracture or subluxation. Vertebral body heights are preserved. Trace anterolisthesis at L4-L5, unchanged. Mild disc height loss throughout the lumbar spine, sparing L5-S1. Moderate lumbar facet arthropathy. Osteopenia. Aortoiliac atherosclerotic vascular disease. IMPRESSION: 1.  No acute osseous abnormality. 2. Mild degenerative disc disease and moderate facet arthropathy throughout the lumbar spine. Electronically Signed   By: Titus Dubin M.D.   On: 02/26/2018 10:11   Ct Head Wo Contrast  Result Date: 02/26/2018 CLINICAL DATA:  77 year old with weakness. EXAM: CT HEAD WITHOUT CONTRAST TECHNIQUE: Contiguous axial images were obtained from the base of the skull through the vertex without intravenous contrast. COMPARISON:  None. FINDINGS: Brain: Cerebral atrophy. Large sulcus along the left posterior vertex probably related to the atrophy. Diffuse low-density throughout the white matter is suggestive for chronic changes.  Ventricles are prominent and likely secondary to atrophy. No evidence for acute hemorrhage, mass lesion, midline shift, hydrocephalus or large infarct. Vascular: Vascular calcifications. Skull: Normal. Negative for fracture or focal lesion. Sinuses/Orbits: No acute finding. Other: None. IMPRESSION: No acute intracranial abnormality. Cerebral atrophy and evidence for chronic small vessel ischemic changes. Electronically Signed   By: Markus Daft M.D.   On: 02/26/2018 09:32   Dg Hip Unilat With Pelvis Min 4 Views Left  Result Date: 02/26/2018 CLINICAL DATA:  Left hip pain after fall last week. EXAM: DG HIP (WITH OR WITHOUT PELVIS) 4+V LEFT COMPARISON:  CT abdomen pelvis dated September 07, 2007. FINDINGS: No acute fracture or dislocation. Joint spaces are preserved. Pubic symphysis and sacroiliac joints are intact. Osteopenia. Soft tissues are unremarkable. Vascular calcifications. IMPRESSION: 1.  No acute osseous abnormality. Electronically Signed   By: Titus Dubin M.D.   On: 02/26/2018 10:07    EKG: Independently reviewed.  Sinus rhythm.  Nonspecific T wave changes.  No acute ST changes.  Assessment/Plan: Principal Problem:   Leg weakness, bilateral Active Problems:   COPD G[place] D, frequent exacerbations   Hypertension   Hyperlipidemia   Hypothyroidism   Depression   Chronic respiratory failure with hypoxia (HCC)   Oral thrush    This patient was discussed with the ED physician, including pertinent vitals, physical exam findings, labs, and imaging.  We also discussed care given by the ED provider.  1. Hamstring weakness bilaterally a. Observation at [facility] b. MRI of lumbar spine c. Neurosurgery to consult d. PT e. Consult care management for home health needs 2. Hypertension a. Continue home medications 3. Hypothyroidism, COPD with chronic respiratory failure  a. Continue home medications 4. Depression 5. Oral thrush  DVT prophylaxis: Lovenox Consultants: Neurosurgery Code  Status: Full code Family Communication: Husband present during interview and exam Disposition Plan: Pending   Jacob Moores [facility] Pager [phone]  If 7PM-7AM, please contact night-coverage [email or web address] Password [email or web address]

## 2018-02-26 NOTE — ED Provider Notes (Signed)
[facility] EMERGENCY DEPARTMENT Provider Note   CSN: [id number] Arrival date & time: 02/26/18  2703     History   Chief Complaint Chief Complaint  Patient presents with  . Weakness    HPI [name] is a 77 y.o. female.  Patient states that she fell over 2 weeks ago.  Was having pain in lumbar spine and left leg.  She is been taking Ultram for pain.  Minimal weakness in left leg.  Patient started Celexa 2 days ago and ever since then she is been too weak to walk.  She is weak in both legs but worse on the left leg.  The history is provided by the patient. No language interpreter was used.  Weakness  Primary symptoms include focal weakness. This is a new problem. The current episode started 2 days ago. The problem has not changed since onset.Affected Side: Worse in left leg but also some right leg. There has been no fever. Pertinent negatives include no shortness of breath, no chest pain and no headaches. There were no medications administered prior to arrival. Associated medical issues include trauma.    Past Medical History:  Diagnosis Date  . CHF (congestive heart failure) ([place])   . COPD (chronic obstructive pulmonary disease) ([place])    home O2  . Diastolic dysfunction   . Dyspnea   . History of nuclear stress test 09/23/2009   dipyridamole; normal pattern of perfusion; low risk scan   . History of tobacco abuse   . Hyperlipidemia   . Hypertension   . Hypothyroidism   . Mild depression ([place])   . Pneumonia     Patient Active Problem List   Diagnosis Date Noted  . Oral thrush 02/26/2017  . Rectal bleeding 10/21/2016  . Diarrhea 06/06/2015  . Abdominal pain 06/06/2015  . Dysphagia   . Respiratory distress 09/30/2014  . Chronic respiratory failure with hypoxia ([place]) 02/02/2014  . Alopecia 02/02/2014  . Loss of weight 01/24/2014  . Unspecified constipation 01/24/2014  . Esophageal dysphagia 01/24/2014  . COPD [place], frequent exacerbations   . Hypertension     . Hyperlipidemia   . Hypothyroidism   . Mild depression ([place])     Past Surgical History:  Procedure Laterality Date  . BIOPSY  01/13/2017   Procedure: BIOPSY;  Surgeon: Daneil Dolin, MD;  Location: [facility] ENDO SUITE;  Service: Endoscopy;;  right/left/sigmoid colon  . CATARACT EXTRACTION W/PHACO  09/08/2012   Procedure: CATARACT EXTRACTION PHACO AND INTRAOCULAR LENS PLACEMENT (IOC);  Surgeon: Tonny Branch, MD;  Location: [facility] ORS;  Service: Ophthalmology;  Laterality: Right;  CDE:19.67  . CATARACT EXTRACTION W/PHACO  09/19/2012   Procedure: CATARACT EXTRACTION PHACO AND INTRAOCULAR LENS PLACEMENT (IOC);  Surgeon: Tonny Branch, MD;  Location: [facility] ORS;  Service: Ophthalmology;  Laterality: Left;  CDE:19.83  . COLONOSCOPY  2007   Dr. Gala Romney: internal hemorrhoids, benign polyps  . COLONOSCOPY N/A 01/13/2017   Procedure: COLONOSCOPY;  Surgeon: Daneil Dolin, MD;  Location: [facility] ENDO SUITE;  Service: Endoscopy;  Laterality: N/A;  12:15pm  . ESOPHAGOGASTRODUODENOSCOPY  2007   Dr. Gala Romney: normal esophagus, non-critical Schatzki's ring not manipulated, normal small bowel biopsy  . ESOPHAGOGASTRODUODENOSCOPY (EGD) WITH ESOPHAGEAL DILATION N/A 10/16/2013   Dr. Fields:Schatzki's ring at the gastroesophageal junction s/p dilation/MODERATE Erosive gastritis. reactive gastropathy.   Marland Kitchen [place]  . right carpal tunnel release    . TONSILLECTOMY    . TOTAL ABDOMINAL HYSTERECTOMY  1980's  . TRANSTHORACIC ECHOCARDIOGRAM  09/23/2009   EF>55%; trace TR; normal LA & RA size; normal LV & RV size & systolic function      OB History   None      Home Medications    Prior to Admission medications   Medication Sig Start Date End Date Taking? Authorizing Provider  albuterol (PROAIR HFA) 108 (90 Base) MCG/ACT inhaler Inhale 2 puffs into the lungs every 6 (six) hours as needed for wheezing or shortness of breath. Shortness of breath 03/04/17   Javier Glazier, MD  albuterol (PROVENTIL) (2.5 MG/3ML) 0.083%  nebulizer solution Take 3 mLs (2.5 mg total) by nebulization 3 (three) times daily. 08/14/16   Javier Glazier, MD  ALPRAZolam Duanne Moron) 0.5 MG tablet Take 0.5-0.75 mg by mouth 2 (two) times daily as needed for anxiety.     [provider]  buPROPion (WELLBUTRIN XL) 150 MG 24 hr tablet Take 150 mg by mouth daily.    [provider]  calcium-vitamin D (OSCAL WITH D) 500-200 MG-UNIT tablet Take 1 tablet by mouth daily.    [provider]  carboxymethylcellulose (REFRESH PLUS) 0.5 % SOLN Place 1 drop into both eyes daily as needed (dry eyes).     [provider]  cetirizine (ZYRTEC) 10 MG tablet Take 10 mg by mouth daily as needed for allergies.    [provider]  diltiazem (CARDIZEM) 120 MG tablet Take 120 mg by mouth daily.  12/28/14   [provider]  Fluticasone Furoate-Vilanterol (BREO ELLIPTA) 100-25 MCG/INH AEPB Inhale 1 puff into the lungs daily. 03/19/15   Elsie Stain, MD  levothyroxine (SYNTHROID, LEVOTHROID) 50 MCG tablet Take 50 mcg by mouth daily before breakfast.    [provider]  Multiple Vitamin (MULTIVITAMIN) capsule Take 1 capsule by mouth daily.      [provider]  Na Sulfate-K Sulfate-Mg Sulf (SUPREP BOWEL PREP KIT) 17.5-3.13-1.6 GM/180ML SOLN Take 1 kit by mouth as directed. 01/04/17   Rourk, [name], MD  naproxen sodium (ANAPROX) 220 MG tablet Take 440 mg by mouth daily as needed (pain).     [provider]  nystatin (MYCOSTATIN) 100000 UNIT/ML suspension 22m QID swish and swallow 02/26/17   NJavier Glazier MD  pantoprazole (PROTONIX) 40 MG tablet Take 40 mg by mouth daily before breakfast.    [provider]  Potassium 99 MG TABS Take 99 mg by mouth daily.    [provider]  pravastatin (PRAVACHOL) 40 MG tablet Take 1 tablet (40 mg total) by mouth at bedtime. NEEDS APPOINTMENT FOR FUTURE REFILLS 04/25/15   HPixie Casino MD  predniSONE (DELTASONE) 10 MG tablet Take 10  mg by mouth 4 (four) times daily as needed (COPD Flare up). For 10 days    [provider]  tiotropium (SPIRIVA) 18 MCG inhalation capsule Place 1 capsule (18 mcg total) into inhaler and inhale daily. 02/26/17 05/18/18  NJavier Glazier MD  torsemide (DEMADEX) 20 MG tablet Take 1 tablet (20 mg total) by mouth once. Patient taking differently: Take 20 mg by mouth daily.  10/30/15   MMarshell Garfinkel MD    Family History Family History  Problem Relation Age of Onset  . Allergies Father   . Heart disease Mother   . Rheum arthritis Mother   . Hypertension Mother   . Stroke Maternal Grandmother   . Breast cancer Daughter   . Breast cancer Sister   . Cancer Maternal Aunt   . Stroke Sister   . Colon  cancer Neg Hx     Social History Social History   Tobacco Use  . Smoking status: Former Smoker    Packs/day: 1.00    Years: 50.00    Pack years: 50.00    Types: Cigarettes    Last attempt to quit: 09/07/2008    Years since quitting: 9.4  . Smokeless tobacco: Never Used  Substance Use Topics  . Alcohol use: No    Alcohol/week: 0.0 oz  . Drug use: No     Allergies   Bee venom   Review of Systems Review of Systems  Constitutional: Negative for appetite change and fatigue.  HENT: Negative for congestion, ear discharge and sinus pressure.   Eyes: Negative for discharge.  Respiratory: Negative for cough and shortness of breath.   Cardiovascular: Negative for chest pain.  Gastrointestinal: Negative for abdominal pain and diarrhea.  Genitourinary: Negative for frequency and hematuria.  Musculoskeletal: Negative for back pain.  Skin: Negative for rash.  Neurological: Positive for focal weakness and weakness. Negative for seizures and headaches.  Psychiatric/Behavioral: Negative for hallucinations.     Physical Exam Updated Vital Signs BP (!) 165/69   Pulse 85   Temp 97.8 F (36.6 C) (Oral)   Resp (!) 21   Ht 5' (1.524 m)   Wt 49.9 kg (110 lb)   SpO2 98%   BMI  21.48 kg/m   Physical Exam  Constitutional: She is oriented to person, place, and time. She appears well-developed.  HENT:  Head: Normocephalic.  Eyes: Conjunctivae and EOM are normal. No scleral icterus.  Neck: Neck supple. No thyromegaly present.  Cardiovascular: Normal rate and regular rhythm. Exam reveals no gallop and no friction rub.  No murmur heard. Pulmonary/Chest: No stridor. She has no wheezes. She has no rales. She exhibits no tenderness.  Abdominal: She exhibits no distension. There is no tenderness. There is no rebound.  Musculoskeletal: Normal range of motion. She exhibits no edema.  Lymphadenopathy:    She has no cervical adenopathy.  Neurological: She is oriented to person, place, and time. She displays normal reflexes. She exhibits normal muscle tone. Coordination normal.  Patient has good strength in both legs when she was sitting or laying.  But she is unable to walk.  When patient stands she has extreme weakness in the left leg and moderate weakness in the right leg.  He is very unsteady and almost falls to the weakness in the legs.  Skin: No rash noted. No erythema.  Psychiatric: She has a normal mood and affect. Her behavior is normal.     ED Treatments / Results  Labs (all labs ordered are listed, but only abnormal results are displayed) Labs Reviewed  CBC WITH DIFFERENTIAL/PLATELET - Abnormal; Notable for the following components:      Result Value   Hemoglobin 11.5 (*)    All other components within normal limits  COMPREHENSIVE METABOLIC PANEL - Abnormal; Notable for the following components:   Total Protein 6.3 (*)    GFR calc non Af Amer 59 (*)    All other components within normal limits  URINALYSIS, ROUTINE W REFLEX MICROSCOPIC    EKG None  Radiology Dg Lumbar Spine Complete  Result Date: 02/26/2018 CLINICAL DATA:  Lower back pain after fall a week ago. EXAM: LUMBAR SPINE - COMPLETE 4+ VIEW COMPARISON:  Lumbar spine x-rays dated December 18, 2013. FINDINGS: Five lumbar type vertebral bodies. No acute fracture or subluxation. Vertebral body heights are preserved. Trace anterolisthesis at L4-L5, unchanged. Mild  disc height loss throughout the lumbar spine, sparing L5-S1. Moderate lumbar facet arthropathy. Osteopenia. Aortoiliac atherosclerotic vascular disease. IMPRESSION: 1.  No acute osseous abnormality. 2. Mild degenerative disc disease and moderate facet arthropathy throughout the lumbar spine. Electronically Signed   By: Titus Dubin M.D.   On: 02/26/2018 10:11   Ct Head Wo Contrast  Result Date: 02/26/2018 CLINICAL DATA:  77 year old with weakness. EXAM: CT HEAD WITHOUT CONTRAST TECHNIQUE: Contiguous axial images were obtained from the base of the skull through the vertex without intravenous contrast. COMPARISON:  None. FINDINGS: Brain: Cerebral atrophy. Large sulcus along the left posterior vertex probably related to the atrophy. Diffuse low-density throughout the white matter is suggestive for chronic changes. Ventricles are prominent and likely secondary to atrophy. No evidence for acute hemorrhage, mass lesion, midline shift, hydrocephalus or large infarct. Vascular: Vascular calcifications. Skull: Normal. Negative for fracture or focal lesion. Sinuses/Orbits: No acute finding. Other: None. IMPRESSION: No acute intracranial abnormality. Cerebral atrophy and evidence for chronic small vessel ischemic changes. Electronically Signed   By: Markus Daft M.D.   On: 02/26/2018 09:32   Dg Hip Unilat With Pelvis Min 4 Views Left  Result Date: 02/26/2018 CLINICAL DATA:  Left hip pain after fall last week. EXAM: DG HIP (WITH OR WITHOUT PELVIS) 4+V LEFT COMPARISON:  CT abdomen pelvis dated September 07, 2007. FINDINGS: No acute fracture or dislocation. Joint spaces are preserved. Pubic symphysis and sacroiliac joints are intact. Osteopenia. Soft tissues are unremarkable. Vascular calcifications. IMPRESSION: 1.  No acute osseous abnormality.  Electronically Signed   By: Titus Dubin M.D.   On: 02/26/2018 10:07    Procedures Procedures (including critical care time)  Medications Ordered in ED Medications  methylPREDNISolone sodium succinate (SOLU-MEDROL) 125 mg/2 mL injection 125 mg (has no administration in time range)     Initial Impression / Assessment and Plan / ED Course  I have reviewed the triage vital signs and the nursing notes.  Pertinent labs & imaging results that were available during my care of the patient were reviewed by me and considered in my medical decision making (see chart for details).     CT of the head unremarkable.  Labs unremarkable.  Patient has an old CT of the lumbar spine that show some disc disease.  I discussed this with the PA [place] who works with Dr. Ronnald Ramp and it was decided the patient should be admitted to [facility] and get an MRI of her lumbar spine.  Final Clinical Impressions(s) / ED Diagnoses   Final diagnoses:  Weakness    ED Discharge Orders    None       Milton Ferguson, MD 02/26/18 1535

## 2018-02-26 NOTE — ED Notes (Signed)
Pt returned from radiology.

## 2018-02-26 NOTE — ED Notes (Signed)
EDP at bedside  

## 2018-02-26 NOTE — ED Notes (Signed)
Pt aware a urine specimen is needed. Will notify staff when one can be obtained. 

## 2018-02-26 NOTE — ED Notes (Signed)
[facility] arrived to transport pt to [facility] 

## 2018-02-27 DIAGNOSIS — R531 Weakness: Secondary | ICD-10-CM | POA: Diagnosis not present

## 2018-02-27 DIAGNOSIS — S32130A Nondisplaced Zone III fracture of sacrum, initial encounter for closed fracture: Secondary | ICD-10-CM | POA: Diagnosis not present

## 2018-02-27 DIAGNOSIS — S3210XA Unspecified fracture of sacrum, initial encounter for closed fracture: Secondary | ICD-10-CM | POA: Diagnosis not present

## 2018-02-27 DIAGNOSIS — M48061 Spinal stenosis, lumbar region without neurogenic claudication: Secondary | ICD-10-CM | POA: Diagnosis not present

## 2018-02-27 MED ORDER — SENNOSIDES-DOCUSATE SODIUM 8.6-50 MG PO TABS
2.0000 | ORAL_TABLET | Freq: Every day | ORAL | Status: DC
  Filled 2018-02-27: qty 2

## 2018-02-27 MED ORDER — CALCIUM CARBONATE-VITAMIN D 500-200 MG-UNIT PO TABS
1.0000 | ORAL_TABLET | Freq: Three times a day (TID) | ORAL | Status: DC
  Administered 2018-02-27 – 2018-02-28 (×4): 1 via ORAL
  Filled 2018-02-27 (×4): qty 1

## 2018-02-27 MED ORDER — BISACODYL 10 MG RE SUPP
10.0000 mg | Freq: Once | RECTAL | Status: AC
  Administered 2018-02-27: 10 mg via RECTAL
  Filled 2018-02-27: qty 1

## 2018-02-27 MED ORDER — CALCITONIN (SALMON) 200 UNIT/ACT NA SOLN
1.0000 | Freq: Every day | NASAL | Status: DC
  Administered 2018-02-27 – 2018-02-28 (×2): 1 via NASAL
  Filled 2018-02-27: qty 3.7

## 2018-02-27 MED ORDER — METOPROLOL TARTRATE 5 MG/5ML IV SOLN
5.0000 mg | Freq: Three times a day (TID) | INTRAVENOUS | Status: DC | PRN
  Administered 2018-02-27: 5 mg via INTRAVENOUS
  Filled 2018-02-27: qty 5

## 2018-02-27 MED ORDER — POLYETHYLENE GLYCOL 3350 17 G PO PACK
17.0000 g | PACK | Freq: Two times a day (BID) | ORAL | Status: DC
  Administered 2018-02-27: 17 g via ORAL
  Filled 2018-02-27 (×3): qty 1

## 2018-02-27 MED ORDER — CALCIUM CITRATE-VITAMIN D 500-500 MG-UNIT PO CHEW
1.0000 | CHEWABLE_TABLET | Freq: Three times a day (TID) | ORAL | Status: DC
  Filled 2018-02-27: qty 1

## 2018-02-27 MED ORDER — METHOCARBAMOL 500 MG PO TABS
500.0000 mg | ORAL_TABLET | Freq: Three times a day (TID) | ORAL | Status: DC
  Administered 2018-02-27 – 2018-02-28 (×4): 500 mg via ORAL
  Filled 2018-02-27 (×4): qty 1

## 2018-02-27 NOTE — Consult Note (Signed)
Reason for Consult: sacral fx Referring Physician: courage  [name] is an 77 y.o. female.   HPI:  77 year old female comes presented to [facility] last night with difficulty ambulating and lower back pain. She states that she fell out of her bed 3 weeks ago. Her pain in her sacrum area has gotten better over time. Denies any pain numbness or tingling down her legs.   Past Medical History:  Diagnosis Date  . CHF (congestive heart failure) ([place])   . COPD (chronic obstructive pulmonary disease) ([place])    home O2  . Diastolic dysfunction   . Dyspnea   . History of nuclear stress test 09/23/2009   dipyridamole; normal pattern of perfusion; low risk scan   . History of tobacco abuse   . Hyperlipidemia   . Hypertension   . Hypothyroidism   . Mild depression ([place])   . Pneumonia     Past Surgical History:  Procedure Laterality Date  . BIOPSY  01/13/2017   Procedure: BIOPSY;  Surgeon: Daneil Dolin, MD;  Location: [facility]O [facility];  Service: Endoscopy;;  right/left/sigmoid colon  . CATARACT EXTRACTION W/PHACO  09/08/2012   Procedure: CATARACT EXTRACTION PHACO AND INTRAOCULAR LENS PLACEMENT (IOC);  Surgeon: Tonny Branch, MD;  Location: [facility] ORS;  Service: Ophthalmology;  Laterality: Right;  CDE:19.67  . CATARACT EXTRACTION W/PHACO  09/19/2012   Procedure: CATARACT EXTRACTION PHACO AND INTRAOCULAR LENS PLACEMENT (IOC);  Surgeon: Tonny Branch, MD;  Location: [facility] ORS;  Service: Ophthalmology;  Laterality: Left;  CDE:19.83  . COLONOSCOPY  2007   Dr. Gala Romney: internal hemorrhoids, benign polyps  . COLONOSCOPY N/A 01/13/2017   Procedure: COLONOSCOPY;  Surgeon: Daneil Dolin, MD;  Location: [facility];  Service: Endoscopy;  Laterality: N/A;  12:15pm  . ESOPHAGOGASTRODUODENOSCOPY  2007   Dr. Gala Romney: normal esophagus, non-critical Schatzki's ring not manipulated, normal small bowel biopsy  . ESOPHAGOGASTRODUODENOSCOPY (EGD) WITH ESOPHAGEAL DILATION N/A 10/16/2013   Dr. Fields:Schatzki's ring at the gastroesophageal  junction s/p dilation/MODERATE Erosive gastritis. reactive gastropathy.   Marland Kitchen [place]  . right carpal tunnel release    . TONSILLECTOMY    . TOTAL ABDOMINAL HYSTERECTOMY  1980's  . TRANSTHORACIC ECHOCARDIOGRAM  09/23/2009   EF>55%; trace TR; normal LA & RA size; normal LV & RV size & systolic function     Allergies  Allergen Reactions  . Bee Venom Swelling    Social History   Tobacco Use  . Smoking status: Former Smoker    Packs/day: 1.00    Years: 50.00    Pack years: 50.00    Types: Cigarettes    Last attempt to quit: 09/07/2008    Years since quitting: 9.4  . Smokeless tobacco: Never Used  Substance Use Topics  . Alcohol use: No    Alcohol/week: 0.0 oz    Family History  Problem Relation Age of Onset  . Allergies Father   . Heart disease Mother   . Rheum arthritis Mother   . Hypertension Mother   . Stroke Maternal Grandmother   . Breast cancer Daughter   . Breast cancer Sister   . Cancer Maternal Aunt   . Stroke Sister   . Colon cancer Neg Hx      Review of Systems  Positive ROS: as above  All other systems have been reviewed and were otherwise negative with the exception of those mentioned in the HPI and as above.  Objective: Vital signs in last 24 hours: Temp:  [97.7 F (  36.5 C)-98 F (36.7 C)] 98 F (36.7 C) (06/23 0428) Pulse Rate:  [85-111] 90 (06/23 0844) Resp:  [18-21] 18 (06/23 0844) BP: (162-195)/(69-96) 179/85 (06/23 0500) SpO2:  [94 %-100 %] 99 % (06/23 0844) Weight:  [50.7 kg (111 lb 12.4 oz)] 50.7 kg (111 lb 12.4 oz) (06/23 0435)  General Appearance: Alert, cooperative, no distress, appears stated age Head: Normocephalic, without obvious abnormality, atraumatic Eyes: PERRL, conjunctiva/corneas clear, EOM's intact, fundi benign, both eyes      Lungs:  respirations unlabored Heart: Regular rate and rhythm Skin: Skin color, texture, turgor normal, no rashes or lesions  NEUROLOGIC:   Mental status: A&O x4, no aphasia, good  attention span, Memory and fund of knowledge Motor Exam - grossly normal, normal tone and bulk Sensory Exam - grossly normal Reflexes: not tested Coordination - not tested Gait - not tested Balance - not tested Cranial Nerves: I: smell Not tested  II: visual acuity  OS: na OD: na  II: visual fields Full to confrontation  II: pupils   III,VII: ptosis None  III,IV,VI: extraocular muscles  Full ROM  V: mastication   V: facial light touch sensation    V,VII: corneal reflex    VII: facial muscle function - upper    VII: facial muscle function - lower   VIII: hearing   IX: soft palate elevation    IX,X: gag reflex   XI: trapezius strength    XI: sternocleidomastoid strength   XI: neck flexion strength    XII: tongue strength      Data Review Lab Results  Component Value Date   WBC 6.9 02/26/2018   HGB 11.5 (L) 02/26/2018   HCT 37.4 02/26/2018   MCV 90.6 02/26/2018   PLT 265 02/26/2018   Lab Results  Component Value Date   NA 145 02/26/2018   K 3.7 02/26/2018   CL 105 02/26/2018   CO2 31 02/26/2018   BUN 18 02/26/2018   CREATININE 0.92 02/26/2018   GLUCOSE 90 02/26/2018   Lab Results  Component Value Date   INR 1.0 09/07/2007    Radiology: Dg Lumbar Spine Complete  Result Date: 02/26/2018 CLINICAL DATA:  Lower back pain after fall a week ago. EXAM: LUMBAR SPINE - COMPLETE 4+ VIEW COMPARISON:  Lumbar spine x-rays dated December 18, 2013. FINDINGS: Five lumbar type vertebral bodies. No acute fracture or subluxation. Vertebral body heights are preserved. Trace anterolisthesis at L4-L5, unchanged. Mild disc height loss throughout the lumbar spine, sparing L5-S1. Moderate lumbar facet arthropathy. Osteopenia. Aortoiliac atherosclerotic vascular disease. IMPRESSION: 1.  No acute osseous abnormality. 2. Mild degenerative disc disease and moderate facet arthropathy throughout the lumbar spine. Electronically Signed   By: Titus Dubin M.D.   On: 02/26/2018 10:11   Ct Head Wo  Contrast  Result Date: 02/26/2018 CLINICAL DATA:  77 year old with weakness. EXAM: CT HEAD WITHOUT CONTRAST TECHNIQUE: Contiguous axial images were obtained from the base of the skull through the vertex without intravenous contrast. COMPARISON:  None. FINDINGS: Brain: Cerebral atrophy. Large sulcus along the left posterior vertex probably related to the atrophy. Diffuse low-density throughout the white matter is suggestive for chronic changes. Ventricles are prominent and likely secondary to atrophy. No evidence for acute hemorrhage, mass lesion, midline shift, hydrocephalus or large infarct. Vascular: Vascular calcifications. Skull: Normal. Negative for fracture or focal lesion. Sinuses/Orbits: No acute finding. Other: None. IMPRESSION: No acute intracranial abnormality. Cerebral atrophy and evidence for chronic small vessel ischemic changes. Electronically Signed   By: Quita Skye  Anselm Pancoast M.D.   On: 02/26/2018 09:32   Mr Lumbar Spine Wo Contrast  Result Date: 02/26/2018 CLINICAL DATA:  Initial evaluation for acute back pain with left leg pain status post fall 2 weeks ago. EXAM: MRI LUMBAR SPINE WITHOUT CONTRAST TECHNIQUE: Multiplanar, multisequence MR imaging of the lumbar spine was performed. No intravenous contrast was administered. COMPARISON:  Prior radiograph from earlier the same day. FINDINGS: Segmentation: Normal segmentation. Lowest well-formed disc labeled the L5-S1 level. Alignment: Mild levoscoliosis with rotary component. 2 mm anterolisthesis of L3 on L4, with 3 mm anterolisthesis of L4 on L5, chronic and facet mediated. Alignment otherwise normal with preservation of the normal lumbar lordosis. Vertebrae: There is an acute to subacute appearing fracture involving the distal sacrum at the level of the S3/S4 segment (series 10, image 19). Extension into the left sacral ala (series 10, image 12). Finding most likely cause of patient's symptoms. This is benign/mechanical in appearance, with no underlying  pathologic lesion. Vertebral body heights maintained without evidence for acute or chronic fracture. Underlying bone marrow signal intensity within normal limits. No discrete or worrisome osseous lesions. No other abnormal marrow edema. Conus medullaris and cauda equina: Conus extends to the L1 level. Conus and cauda equina appear normal. Paraspinal and other soft tissues: Paraspinous soft tissues demonstrate no acute abnormality. Few scattered T2 hyperintense cyst noted within the kidneys bilaterally. Visualized visceral structures otherwise unremarkable. Disc levels: T12-L1: Mild disc bulge with superimposed tiny left paracentral disc protrusion. No stenosis. L1-2: Mild facet hypertrophy.  No stenosis. L2-3: Mild disc bulge. Mild facet and ligamentum flavum hypertrophy. No significant stenosis. L3-4: Trace anterolisthesis. Mild diffuse disc bulge. Moderate facet and ligament flavum hypertrophy. Small bilateral reactive joint effusions. Resultant mild spinal stenosis. Mild right L3 foraminal narrowing. L4-5: Anterolisthesis. Broad posterior disc bulge with disc desiccation. Moderate facet and ligament flavum hypertrophy. Resultant mild to moderate spinal stenosis. Foramina are elongated but remain patent. L5-S1:  Mild facet hypertrophy.  No stenosis. IMPRESSION: 1. Acute fracture involving the distal sacrum with extension into the left sacral ala. Finding is benign/mechanical in nature, and most likely related to recent fall. 2. Mild chronic facet mediated anterolisthesis of L3 on L4 and L4 on L5 with resultant mild to moderate spinal stenosis, most pronounced at L4-5. No neural impingement. Electronically Signed   By: Jeannine Boga M.D.   On: 02/26/2018 22:14   Dg Hip Unilat With Pelvis Min 4 Views Left  Result Date: 02/26/2018 CLINICAL DATA:  Left hip pain after fall last week. EXAM: DG HIP (WITH OR WITHOUT PELVIS) 4+V LEFT COMPARISON:  CT abdomen pelvis dated September 07, 2007. FINDINGS: No acute  fracture or dislocation. Joint spaces are preserved. Pubic symphysis and sacroiliac joints are intact. Osteopenia. Soft tissues are unremarkable. Vascular calcifications. IMPRESSION: 1.  No acute osseous abnormality. Electronically Signed   By: Titus Dubin M.D.   On: 02/26/2018 10:07     Assessment/Plan: 77 year old presented to [facility] last night with lower back pain. MRI shows some mild spinal stenosis and sacral fractures. No neurosurgical intervention needed at this time. Should her pain worsen she could be a candidate for sacroplasty. Although, her pain is improving so I would hold off at this time. She can get up and ambulate as tolerated.    Ocie Cornfield Angele Wiemann 02/27/2018 11:14 AM

## 2018-02-27 NOTE — Progress Notes (Signed)
PT Cancellation Note  Patient Details Name: [name] MRN: [id number] DOB: 01/16/1941   Cancelled Treatment:    Reason Eval/Treat Not Completed: Other (comment)(Note that pt is awaiting neuro consult. Will return at later date.)   [name] 02/27/2018, 8:39 AM [facility] [phone] (pager)

## 2018-02-27 NOTE — Progress Notes (Signed)
Patient Demographics:    [name], is a 77 y.o. female, DOB - Oct 25, 1940, NLG:[id number]  Admit date - 02/26/2018   Admitting Physician Truett Mainland, DO  Outpatient Primary MD for the patient is [name], MD  LOS - 0   Chief Complaint  Patient presents with  . Weakness        Subjective:    [name] today has no fevers, no emesis,  No chest pain,  Son Matt at bedside   Assessment  & Plan :    Principal Problem:   Closed sacral fracture/S2-S3 Active Problems:   COPD [place] D, frequent exacerbations   Hypertension   Hyperlipidemia   Hypothyroidism   Depression   Chronic respiratory failure with hypoxia (HCC)   Oral thrush   Leg weakness, bilateral   Fall at home, initial encounter  Brief Summary  77 y.o. female with a history of COPD with chronic respiratory failure on home O2, CHF with chronic diastolic dysfunction, hypertension, hypothyroidism, mild depression, hyperlipidemia transferred on 02/26/2018 from [facility] to [facility] with concerns about low back pain after sustaining a fall at home almost 3 weeks ago   Plan:- 1)S2-S3 traumatic/mechanical sacral fractures--- status post fall almost 3 weeks prior to admission, MRI lumbar spine with S2 -S3 (Sacral) Acute Traumatic/mechanical fractures extending into the left sacral ala, without significant neural impingement or significant spinal stenosis. Neurosurgical consult from Dr. Sherley Bounds appreciated, conservative nonsurgical approach advised, neurosurgeon advises sacroplasty in the future if pain control becomes challenging with mobilization, patient will need outpatient DEXA scan and other work-up for possible osteoporosis....   Empirically start calcium and vitamin D and Miacalcin, methocarbamol and PRN opiates for pain control, PT OT eval pending  2)COPD--- stable, no acute exacerbation at this time, continue  bronchodilators  3)Constipation-no significant abdominal pain, continue laxatives as advised  4) hypothyroidism-stable, continue levothyroxine  5)HFpEF--- chronic diastolic dysfunction CHF, no acute exacerbation at this time, continue torsemide  Code Status : full   Disposition Plan  : ??? Home with HH Vs SNF  Consults  :  NeuroSurgery   DVT Prophylaxis  :  Lovenox   Lab Results  Component Value Date   PLT 265 02/26/2018    Inpatient Medications  Scheduled Meds: . buPROPion  150 mg Oral Daily  . calcitonin (salmon)  1 spray Alternating Nares Daily  . calcium-vitamin D  1 tablet Oral TID  . diltiazem  120 mg Oral Daily  . enoxaparin (LOVENOX) injection  40 mg Subcutaneous Q24H  . fluticasone furoate-vilanterol  1 puff Inhalation Daily  . levothyroxine  50 mcg Oral QAC breakfast  . methocarbamol  500 mg Oral TID  . nystatin  5 mL Oral QID  . pantoprazole  40 mg Oral QAC breakfast  . polyethylene glycol  17 g Oral BID  . pravastatin  40 mg Oral QHS  . senna-docusate  2 tablet Oral QHS  . tiotropium  18 mcg Inhalation Daily  . torsemide  20 mg Oral Daily   Continuous Infusions: PRN Meds:.acetaminophen **OR** acetaminophen, albuterol, ALPRAZolam, loratadine, metoprolol tartrate, ondansetron **OR** ondansetron (ZOFRAN) IV    Anti-infectives (From admission, onward)   None        Objective:   Vitals:  02/27/18 0428 02/27/18 0435 02/27/18 0500 02/27/18 0844  BP: (!) 190/89  (!) 179/85   Pulse: (!) 111  88 90  Resp: 18   18  Temp: 98 F (36.7 C)     TempSrc: Oral     SpO2: 94%  100% 99%  Weight:  50.7 kg (111 lb 12.4 oz)    Height:        Wt Readings from Last 3 Encounters:  02/27/18 50.7 kg (111 lb 12.4 oz)  02/26/17 48.8 kg (107 lb 9.6 oz)  02/11/17 49 kg (108 lb)    No intake or output data in the 24 hours ending 02/27/18 1411   Physical Exam  Gen:- Awake Alert,  In no apparent distress  HEENT:- [place].AT, No sclera icterus Neck-Supple Neck,No  JVD,.  Lungs-  CTAB , good air movement CV- S1, S2 normal Abd-  +ve B.Sounds, Abd Soft, No tenderness,    Extremity/Skin:- No  edema,   good pulses Psych-affect is appropriate, oriented x3 Neuro-no new focal deficits, no tremors Back/Lumbar Spine--- pain with palpation and range of motion in the lower sacral areas  Data Review:   Micro Results No results found for this or any previous visit (from the past 240 hour(s)).  Radiology Reports Dg Lumbar Spine Complete  Result Date: 02/26/2018 CLINICAL DATA:  Lower back pain after fall a week ago. EXAM: LUMBAR SPINE - COMPLETE 4+ VIEW COMPARISON:  Lumbar spine x-rays dated December 18, 2013. FINDINGS: Five lumbar type vertebral bodies. No acute fracture or subluxation. Vertebral body heights are preserved. Trace anterolisthesis at L4-L5, unchanged. Mild disc height loss throughout the lumbar spine, sparing L5-S1. Moderate lumbar facet arthropathy. Osteopenia. Aortoiliac atherosclerotic vascular disease. IMPRESSION: 1.  No acute osseous abnormality. 2. Mild degenerative disc disease and moderate facet arthropathy throughout the lumbar spine. Electronically Signed   By: Titus Dubin M.D.   On: 02/26/2018 10:11   Ct Head Wo Contrast  Result Date: 02/26/2018 CLINICAL DATA:  76 year old with weakness. EXAM: CT HEAD WITHOUT CONTRAST TECHNIQUE: Contiguous axial images were obtained from the base of the skull through the vertex without intravenous contrast. COMPARISON:  None. FINDINGS: Brain: Cerebral atrophy. Large sulcus along the left posterior vertex probably related to the atrophy. Diffuse low-density throughout the white matter is suggestive for chronic changes. Ventricles are prominent and likely secondary to atrophy. No evidence for acute hemorrhage, mass lesion, midline shift, hydrocephalus or large infarct. Vascular: Vascular calcifications. Skull: Normal. Negative for fracture or focal lesion. Sinuses/Orbits: No acute finding. Other: None.  IMPRESSION: No acute intracranial abnormality. Cerebral atrophy and evidence for chronic small vessel ischemic changes. Electronically Signed   By: Markus Daft M.D.   On: 02/26/2018 09:32   Mr Lumbar Spine Wo Contrast  Result Date: 02/26/2018 CLINICAL DATA:  Initial evaluation for acute back pain with left leg pain status post fall 2 weeks ago. EXAM: MRI LUMBAR SPINE WITHOUT CONTRAST TECHNIQUE: Multiplanar, multisequence MR imaging of the lumbar spine was performed. No intravenous contrast was administered. COMPARISON:  Prior radiograph from earlier the same day. FINDINGS: Segmentation: Normal segmentation. Lowest well-formed disc labeled the L5-S1 level. Alignment: Mild levoscoliosis with rotary component. 2 mm anterolisthesis of L3 on L4, with 3 mm anterolisthesis of L4 on L5, chronic and facet mediated. Alignment otherwise normal with preservation of the normal lumbar lordosis. Vertebrae: There is an acute to subacute appearing fracture involving the distal sacrum at the level of the S3/S4 segment (series 10, image 19). Extension into the left sacral ala (series  10, image 12). Finding most likely cause of patient's symptoms. This is benign/mechanical in appearance, with no underlying pathologic lesion. Vertebral body heights maintained without evidence for acute or chronic fracture. Underlying bone marrow signal intensity within normal limits. No discrete or worrisome osseous lesions. No other abnormal marrow edema. Conus medullaris and cauda equina: Conus extends to the L1 level. Conus and cauda equina appear normal. Paraspinal and other soft tissues: Paraspinous soft tissues demonstrate no acute abnormality. Few scattered T2 hyperintense cyst noted within the kidneys bilaterally. Visualized visceral structures otherwise unremarkable. Disc levels: T12-L1: Mild disc bulge with superimposed tiny left paracentral disc protrusion. No stenosis. L1-2: Mild facet hypertrophy.  No stenosis. L2-3: Mild disc bulge.  Mild facet and ligamentum flavum hypertrophy. No significant stenosis. L3-4: Trace anterolisthesis. Mild diffuse disc bulge. Moderate facet and ligament flavum hypertrophy. Small bilateral reactive joint effusions. Resultant mild spinal stenosis. Mild right L3 foraminal narrowing. L4-5: Anterolisthesis. Broad posterior disc bulge with disc desiccation. Moderate facet and ligament flavum hypertrophy. Resultant mild to moderate spinal stenosis. Foramina are elongated but remain patent. L5-S1:  Mild facet hypertrophy.  No stenosis. IMPRESSION: 1. Acute fracture involving the distal sacrum with extension into the left sacral ala. Finding is benign/mechanical in nature, and most likely related to recent fall. 2. Mild chronic facet mediated anterolisthesis of L3 on L4 and L4 on L5 with resultant mild to moderate spinal stenosis, most pronounced at L4-5. No neural impingement. Electronically Signed   By: Jeannine Boga M.D.   On: 02/26/2018 22:14   Dg Hip Unilat With Pelvis Min 4 Views Left  Result Date: 02/26/2018 CLINICAL DATA:  Left hip pain after fall last week. EXAM: DG HIP (WITH OR WITHOUT PELVIS) 4+V LEFT COMPARISON:  CT abdomen pelvis dated September 07, 2007. FINDINGS: No acute fracture or dislocation. Joint spaces are preserved. Pubic symphysis and sacroiliac joints are intact. Osteopenia. Soft tissues are unremarkable. Vascular calcifications. IMPRESSION: 1.  No acute osseous abnormality. Electronically Signed   By: Titus Dubin M.D.   On: 02/26/2018 10:07     CBC Recent Labs  Lab 02/26/18 0857  WBC 6.9  HGB 11.5*  HCT 37.4  PLT 265  MCV 90.6  MCH 27.8  MCHC 30.7  RDW 13.3  LYMPHSABS 1.2  MONOABS 0.7  EOSABS 0.5  BASOSABS 0.0    Chemistries  Recent Labs  Lab 02/26/18 0857  NA 145  K 3.7  CL 105  CO2 31  GLUCOSE 90  BUN 18  CREATININE 0.92  CALCIUM 8.9  AST 16  ALT 15  ALKPHOS 73  BILITOT 0.9    ------------------------------------------------------------------------------------------------------------------ No results for input(s): CHOL, HDL, LDLCALC, TRIG, CHOLHDL, LDLDIRECT in the last 72 hours.  Lab Results  Component Value Date   HGBA1C  09/09/2007    5.7 (NOTE)   The ADA recommends the following therapeutic goals for glycemic   control related to Hgb A1C measurement:   Goal of Therapy:   < 7.0% Hgb A1C   Action Suggested:  > 8.0% Hgb A1C   Ref:  Diabetes Care, 22, Suppl. 1, 1999   ------------------------------------------------------------------------------------------------------------------ No results for input(s): TSH, T4TOTAL, T3FREE, THYROIDAB in the last 72 hours.  Invalid input(s): FREET3 ------------------------------------------------------------------------------------------------------------------ No results for input(s): VITAMINB12, FOLATE, FERRITIN, TIBC, IRON, RETICCTPCT in the last 72 hours.  Coagulation profile No results for input(s): INR, PROTIME in the last 168 hours.  No results for input(s): DDIMER in the last 72 hours.  Cardiac Enzymes No results for input(s): CKMB, TROPONINI, MYOGLOBIN in  the last 168 hours.  Invalid input(s): CK ------------------------------------------------------------------------------------------------------------------ No results found for: BNP   Roxan Hockey M.D on 02/27/2018 at 2:11 PM  Between 7am to 7pm - Pager - [phone]  After 7pm go to [email or web address] - password [email or web address]  [facility] -  Office  [phone]   Voice Recognition Viviann Spare dictation system was used to create this note, attempts have been made to correct errors. Please contact the author with questions and/or clarifications.

## 2018-02-27 NOTE — Evaluation (Signed)
Physical Therapy Evaluation Patient Details Name: [name] MRN: [id number] DOB: [birth year] Today's Date: 02/27/2018   History of Present Illness  77 year old presented to [facility] last night with lower back pain and difficulty walking. MRI shows some mild spinal stenosis and sacral fractures.  has a past medical history of CHF (congestive heart failure) ([place]), COPD (chronic obstructive pulmonary disease) ([place]), Diastolic dysfunction  Clinical Impression   Pt admitted with above diagnosis. Pt currently with functional limitations due to the deficits listed below (see PT Problem List). Presents with very unsteady and inefficient gait, and significant fall risk; Appropriate for SNF to rehab gait and balance dysfunction, however with OBS status, unlikely insurance will cover SNF; in that case, rec max HH services;   Pt will benefit from skilled PT to increase their independence and safety with mobility to allow discharge to the venue listed below.       Follow Up Recommendations Home health PT;Supervision/Assistance - 24 hour;Other (comment)([facility] for home services through [place] and [place])    Clinical biochemist with 5" wheels;3in1 (PT)    Recommendations for Other Services OT consult(noted OT order)     Precautions / Restrictions Precautions Precautions: Fall      Mobility  Bed Mobility Overal bed mobility: Needs Assistance Bed Mobility: Rolling;Sidelying to Sit Rolling: Min assist Sidelying to sit: Mod assist       General bed mobility comments: Cues for technique to roll and push up from sidelying; light mod assist to elevate trunk to sit; tends to have a posterior lean sitting EOB; Able to reciprocally scoot hips forward to EOB, but needing mod to max cues to finish the task and get her feet on the floor  Transfers Overall transfer level: Needs assistance Equipment used: Rolling walker (2 wheeled) Transfers: Sit to/from Stand Sit to  Stand: Mod assist         General transfer comment: light mod assist to steady; tends to lean psoteriorly  Ambulation/Gait Ambulation/Gait assistance: Min assist Gait Distance (Feet): 30 Feet Assistive device: Rolling walker (2 wheeled) Gait Pattern/deviations: Step-to pattern;Decreased step length - right;Decreased step length - left Gait velocity: slowed   General Gait Details: occasional posterior lean; cues to support self anteriorly on RW; min assist to steady  Stairs            Wheelchair Mobility    Modified Rankin (Stroke Patients Only)       Balance Overall balance assessment: Needs assistance;History of Falls Sitting-balance support: No upper extremity supported;Single extremity supported Sitting balance-Leahy Scale: Poor(approaching fair) Sitting balance - Comments: tendign to lean posteriorly Postural control: Posterior lean Standing balance support: During functional activity Standing balance-Leahy Scale: Poor                               Pertinent Vitals/Pain Pain Assessment: Faces Pain Score: 5  Faces Pain Scale: Hurts a little bit Pain Location: low back; pain did not worsen with mobility Pain Descriptors / Indicators: Aching Pain Intervention(s): Monitored during session    Home Living Family/patient expects to be discharged to:: Private residence Living Arrangements: Spouse/significant other Available Help at Discharge: Family Type of Home: House Home Access: Stairs to enter   Technical brewer of Steps: 2 Home Layout: Two level;Able to live on main level with bedroom/bathroom   Additional Comments: At one point, Ms. Cabral indicated she has a RW; when asked if 2 wheels  or 4 wheels, she replied she didn't know, and may have given her RW away    Prior Function Level of Independence: Needs assistance   Gait / Transfers Assistance Needed: has needed assist since her fall approx 3 weeks ago     Comments: a somewhat  confusing historian     Hand Dominance        Extremity/Trunk Assessment   Upper Extremity Assessment Upper Extremity Assessment: Defer to OT evaluation;Generalized weakness    Lower Extremity Assessment Lower Extremity Assessment: Generalized weakness       Communication   Communication: No difficulties  Cognition Arousal/Alertness: Awake/alert Behavior During Therapy: WFL for tasks assessed/performed Overall Cognitive Status: No family/caregiver present to determine baseline cognitive functioning                                        General Comments General comments (skin integrity, edema, etc.): session conducted on 3 L supplemental O2 at home    Exercises     Assessment/Plan    PT Assessment Patient needs continued PT services  PT Problem List Decreased strength;Decreased activity tolerance;Decreased balance;Decreased mobility;Decreased coordination;Decreased cognition;Decreased knowledge of use of DME;Decreased safety awareness;Decreased knowledge of precautions;Pain;Cardiopulmonary status limiting activity       PT Treatment Interventions DME instruction;Gait training;Stair training;Functional mobility training;Therapeutic activities;Therapeutic exercise;Balance training;Neuromuscular re-education;Cognitive remediation;Patient/family education    PT Goals (Current goals can be found in the Care Plan section)  Acute Rehab PT Goals Patient Stated Goal: agreeable to walk PT Goal Formulation: With patient Time For Goal Achievement: 03/13/18 Potential to Achieve Goals: Good    Frequency Min 4X/week   Barriers to discharge   Observation status as of PT eval    Co-evaluation               AM-PAC PT "6 Clicks" Daily Activity  Outcome Measure Difficulty turning over in bed (including adjusting bedclothes, sheets and blankets)?: A Little Difficulty moving from lying on back to sitting on the side of the bed? : Unable Difficulty sitting  down on and standing up from a chair with arms (e.g., wheelchair, bedside commode, etc,.)?: Unable Help needed moving to and from a bed to chair (including a wheelchair)?: A Lot Help needed walking in hospital room?: A Little Help needed climbing 3-5 steps with a railing? : A Lot 6 Click Score: 12    End of Session Equipment Utilized During Treatment: Gait belt;Oxygen Activity Tolerance: Patient tolerated treatment well Patient left: in chair;with call bell/phone within reach;with chair alarm set Nurse Communication: Mobility status PT Visit Diagnosis: Unsteadiness on feet (R26.81);Other abnormalities of gait and mobility (R26.89);Repeated falls (R29.6);Pain Pain - part of body: (low back and sacrum)    Time: 3734-2876 PT Time Calculation (min) (ACUTE ONLY): 28 min   Charges:   PT Evaluation $PT Eval Moderate Complexity: 1 Mod PT Treatments $Gait Training: 8-22 mins   PT G Codes:        Roney Marion, PT  Acute Rehabilitation Services Pager [phone] Office [phone]   [place] 02/27/2018, 4:04 PM

## 2018-02-28 DIAGNOSIS — S32130A Nondisplaced Zone III fracture of sacrum, initial encounter for closed fracture: Secondary | ICD-10-CM | POA: Diagnosis not present

## 2018-02-28 DIAGNOSIS — R531 Weakness: Secondary | ICD-10-CM | POA: Diagnosis not present

## 2018-02-28 MED ORDER — CALCIUM CARBONATE-VITAMIN D 500-200 MG-UNIT PO TABS: 1.0000 | ORAL_TABLET | Freq: Two times a day (BID) | ORAL | 3 refills | Status: DC

## 2018-02-28 MED ORDER — DILTIAZEM HCL ER COATED BEADS 120 MG PO CP24: 120.0000 mg | ORAL_CAPSULE | Freq: Every day | ORAL | 2 refills | Status: DC

## 2018-02-28 MED ORDER — METHOCARBAMOL 500 MG PO TABS: 500.0000 mg | ORAL_TABLET | Freq: Two times a day (BID) | ORAL | 2 refills | Status: DC

## 2018-02-28 MED ORDER — PRAVASTATIN SODIUM 40 MG PO TABS: 40.0000 mg | ORAL_TABLET | Freq: Every day | ORAL | 3 refills | Status: DC

## 2018-02-28 MED ORDER — HYDROCODONE-ACETAMINOPHEN 5-325 MG PO TABS: 1.0000 | ORAL_TABLET | Freq: Four times a day (QID) | ORAL | 0 refills | Status: AC | PRN

## 2018-02-28 MED ORDER — CALCITONIN (SALMON) 200 UNIT/ACT NA SOLN: 1.0000 | Freq: Every day | NASAL | 3 refills | Status: DC

## 2018-02-28 MED ORDER — ALPRAZOLAM 0.25 MG PO TABS: 0.2500 mg | ORAL_TABLET | Freq: Two times a day (BID) | ORAL | 0 refills | Status: DC | PRN

## 2018-02-28 MED ORDER — POLYETHYLENE GLYCOL 3350 17 G PO PACK: 17.0000 g | PACK | Freq: Two times a day (BID) | ORAL | 3 refills | Status: AC

## 2018-02-28 MED ORDER — ONDANSETRON HCL 4 MG PO TABS: 4.0000 mg | ORAL_TABLET | Freq: Four times a day (QID) | ORAL | 0 refills | Status: DC | PRN

## 2018-02-28 MED ORDER — ACETAMINOPHEN 325 MG PO TABS: 650.0000 mg | ORAL_TABLET | Freq: Four times a day (QID) | ORAL | 2 refills | Status: AC | PRN

## 2018-02-28 MED ORDER — SENNOSIDES-DOCUSATE SODIUM 8.6-50 MG PO TABS: 2.0000 | ORAL_TABLET | Freq: Every day | ORAL | 2 refills | Status: DC

## 2018-02-28 MED ORDER — PANTOPRAZOLE SODIUM 40 MG PO TBEC: 40.0000 mg | DELAYED_RELEASE_TABLET | Freq: Every day | ORAL | 3 refills | Status: AC

## 2018-02-28 NOTE — Evaluation (Signed)
Occupational Therapy Evaluation Patient Details Name: [name] MRN: [id number] DOB: 1941/06/14 Today's Date: 02/28/2018    History of Present Illness 77 year old presented to [facility] last night with lower back pain and difficulty walking. MRI shows some mild spinal stenosis and sacral fractures.  has a past medical history of CHF (congestive heart failure) ([place]), COPD (chronic obstructive pulmonary disease) ([place]), Diastolic dysfunction   Clinical Impression   Pt reporting that she was living with her husband and was independent including ADLs, IADLs, and driving. Unsure of reliability with some noted confusion during session. Family not present to confirm information. Currently, pt requires Min Guard A for LB ADLs and functional mobility using RW. Pt presenting with decreased strength, balance, and memory. Pt very pleasant and excited to get OOB. Pt would benefit from further acute OT to facilitate safe dc. Pending confirmation of 24 hour support, recommend dc to home with HHOT for further OT to optimize safety, independence with ADLs, and return to PLOF.     Follow Up Recommendations  Home health OT;Supervision/Assistance - 24 hour    Equipment Recommendations  None recommended by OT    Recommendations for Other Services PT consult     Precautions / Restrictions Precautions Precautions: Fall Restrictions Weight Bearing Restrictions: No      Mobility Bed Mobility Overal bed mobility: Needs Assistance Bed Mobility: Supine to Sit     Supine to sit: Min guard;HOB elevated     General bed mobility comments: Min Guard A for safety. HOB elevated  Transfers Overall transfer level: Needs assistance Equipment used: Rolling walker (2 wheeled) Transfers: Sit to/from Stand Sit to Stand: Min guard         General transfer comment: Min Guard A for safety. No physical A needed to power up into standing.     Balance Overall balance assessment: Needs assistance Sitting-balance  support: No upper extremity supported;Feet supported Sitting balance-Leahy Scale: Fair Sitting balance - Comments: able to bend forward to adjust socks without LOB   Standing balance support: No upper extremity supported;During functional activity Standing balance-Leahy Scale: Fair Standing balance comment: Able to maintain static standing without UE support                           ADL either performed or assessed with clinical judgement   ADL Overall ADL's : Needs assistance/impaired Eating/Feeding: Set up;Sitting   Grooming: Wash/dry hands;Min guard;Standing Grooming Details (indicate cue type and reason): Min Guard for safety in standing Upper Body Bathing: Set up;Supervision/ safety;Sitting   Lower Body Bathing: Min guard;Sit to/from stand   Upper Body Dressing : Set up;Supervision/safety;Sitting   Lower Body Dressing: Min guard;Sit to/from stand Lower Body Dressing Details (indicate cue type and reason): Pt able to bend forward to adjust socks while sitting at EOB. Requiring close Min Guard A for dynamic standing balance Toilet Transfer: Min guard;Ambulation;RW Toilet Transfer Details (indicate cue type and reason): Pt performing simulated toilet transfer to recliner. Requiring MIn Guard A for safety and balance.         Functional mobility during ADLs: Min guard;Rolling walker General ADL Comments: Pt demosntrating decreased strength, balance, and memory.      Vision Baseline Vision/History: Wears glasses Wears Glasses: ("As I need them") Patient Visual Report: No change from baseline       Perception     Praxis      Pertinent Vitals/Pain Pain Assessment: No/denies pain     Hand Dominance  Extremity/Trunk Assessment Upper Extremity Assessment Upper Extremity Assessment: Generalized weakness   Lower Extremity Assessment Lower Extremity Assessment: Generalized weakness   Cervical / Trunk Assessment Cervical / Trunk Assessment: Other  exceptions Cervical / Trunk Exceptions: Sacral fx   Communication Communication Communication: No difficulties   Cognition Arousal/Alertness: Awake/alert Behavior During Therapy: WFL for tasks assessed/performed Overall Cognitive Status: No family/caregiver present to determine baseline cognitive functioning                                 General Comments: Pt with some confusion during questions for PLOF. 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 I don't remember if I have a walker, but i think I will need one now." Pt also reporting she came to the hospital because she "rolled out of bed" and fell.   General Comments  Pt on 3L O2 throughout session. No SOB or dizziness    Exercises     Shoulder Instructions      Home Living Family/patient expects to be discharged to:: Private residence Living Arrangements: Spouse/significant other Available Help at Discharge: Family Type of Home: House Home Access: Stairs to enter [employer] of Steps: 2   Home Layout: Two level;Able to live on main level with bedroom/bathroom Alternate Level Stairs-Number of Steps: Flight to basement   Bathroom Shower/Tub: Tub/shower unit;Curtain;Walk-in shower   Bathroom Toilet: Standard         Additional Comments: Unsure of reliability and no family present. Doesn't remember is she has any DME. Reports she is on 3L O2 at home      Prior Functioning/Environment Level of Independence: Needs assistance  Gait / Transfers Assistance Needed: Report she is abel to walk without DME ADL's / Homemaking Assistance Needed: Pt reports she is independent with BADLs and light IADLs. She reports she was "driving good".    Comments: Confusion and recall of PLOF and home set up.         OT Problem List: Decreased strength;Decreased range of motion;Decreased activity tolerance;Impaired balance (sitting and/or standing);Decreased knowledge of use of DME or AE;Decreased knowledge of precautions;Pain       OT Treatment/Interventions: Self-care/ADL training;Energy conservation;DME and/or AE instruction;Therapeutic activities;Patient/family education;Therapeutic exercise    OT Goals(Current goals can be found in the care plan section) Acute Rehab OT Goals Patient Stated Goal: get up and move OT Goal Formulation: With patient Time For Goal Achievement: 03/14/18 Potential to Achieve Goals: Good ADL Goals Pt Will Perform Grooming: with modified independence;standing Pt Will Perform Lower Body Dressing: with modified independence;sit to/from stand Pt Will Transfer to Toilet: with modified independence;regular height toilet;ambulating Pt Will Perform Toileting - Clothing Manipulation and hygiene: with modified independence;sit to/from stand  OT Frequency: Min 3X/week   Barriers to D/C:            Co-evaluation              AM-PAC PT "6 Clicks" Daily Activity     Outcome Measure Help from another person eating meals?: A Little Help from another person taking care of personal grooming?: A Little Help from another person toileting, which includes using toliet, bedpan, or urinal?: A Little Help from another person bathing (including washing, rinsing, drying)?: A Little Help from another person to put on and taking off regular upper body clothing?: A Little Help from another person to put on and taking off regular lower body clothing?: A Little 6 Click Score: 18   End  of Session Equipment Utilized During Treatment: Rolling walker;Oxygen Nurse Communication: Mobility status  Activity Tolerance: Patient tolerated treatment well Patient left: in chair;with call bell/phone within reach;with chair alarm set  OT Visit Diagnosis: Unsteadiness on feet (R26.81);Other abnormalities of gait and mobility (R26.89);Muscle weakness (generalized) (M62.81);History of falling (Z91.81)                Time: 3685-9923 OT Time Calculation (min): 14 min Charges:  OT General Charges $OT Visit: 1  Visit OT Evaluation $OT Eval Moderate Complexity: 1 Mod G-Codes:     [place] [facility], OTR/L Acute Rehab Pager: [phone] Office: [place] 02/28/2018, 8:25 AM

## 2018-02-28 NOTE — Progress Notes (Signed)
Reviewed discharge instructions with patient. Answered her questions. Pt is stable and ready for discharge. Waiting on her ride.

## 2018-02-28 NOTE — Progress Notes (Signed)
Physical Therapy Treatment Patient Details Name: [name] MRN: [id number] DOB: 08/10/1941 Today's Date: 02/28/2018    History of Present Illness 77 year old admitted with lower back pain and difficulty walking. MRI shows some mild spinal stenosis and sacral fractures.  PMHX: of CHF (congestive heart failure) ([place]), COPD (chronic obstructive pulmonary disease) ([place]), Diastolic dysfunction    PT Comments    Pt very pleasant and eager to return home. Pt with greatly improved activity tolerance, balance and gait today able to complete hall ambulation and stairs with use of RW for increased stability. Pt educated for continued need for RW at home and sequence for stairs. Pt also encouraged not to perform stairs without assist for management of RW. Will continue to follow acutely.  Pt on 3L throughout   Follow Up Recommendations  Home health PT;Supervision for mobility/OOB     Equipment Recommendations  Rolling walker with 5" wheels    Recommendations for Other Services       Precautions / Restrictions Precautions Precautions: Fall    Mobility  Bed Mobility               General bed mobility comments: on BSC on arrival  Transfers Overall transfer level: Needs assistance   Transfers: Sit to/from Stand Sit to Stand: Supervision         General transfer comment: supervision for safety with pt able to stand from [facility] without assist  Ambulation/Gait Ambulation/Gait assistance: Supervision Gait Distance (Feet): 140 Feet Assistive device: Rolling walker (2 wheeled) Gait Pattern/deviations: Step-through pattern;Decreased stride length   Gait velocity interpretation: <1.8 ft/sec, indicate of risk for recurrent falls General Gait Details: cues for step into RW. Pt walked 20' without RW with increased unsteadiness and 2 partial LOB with pt educated to use RW at all times and pt agreeable.    Stairs Stairs: Yes Stairs assistance: Min guard Stair Management: Step to  pattern;Sideways;Forwards;One rail Left Number of Stairs: 9 General stair comments: pt ascended with left rail forward left leading. descending with RLE leading with cues and bil UE support on rail sideways   Wheelchair Mobility    Modified Rankin (Stroke Patients Only)       Balance Overall balance assessment: Needs assistance   Sitting balance-Leahy Scale: Good       Standing balance-Leahy Scale: Fair Standing balance comment: increased stability with RW with gait                            Cognition Arousal/Alertness: Awake/alert Behavior During Therapy: WFL for tasks assessed/performed Overall Cognitive Status: Within Functional Limits for tasks assessed                                        Exercises      General Comments        Pertinent Vitals/Pain Pain Assessment: No/denies pain    Home Living                      Prior Function            PT Goals (current goals can now be found in the care plan section) Progress towards PT goals: Progressing toward goals    Frequency    Min 3X/week      PT Plan Current plan remains appropriate;Discharge plan needs to be updated;Frequency needs to  be updated    Co-evaluation              AM-PAC PT "6 Clicks" Daily Activity  Outcome Measure  Difficulty turning over in bed (including adjusting bedclothes, sheets and blankets)?: A Little Difficulty moving from lying on back to sitting on the side of the bed? : A Little Difficulty sitting down on and standing up from a chair with arms (e.g., wheelchair, bedside commode, etc,.)?: A Little Help needed moving to and from a bed to chair (including a wheelchair)?: A Little Help needed walking in hospital room?: A Little Help needed climbing 3-5 steps with a railing? : A Little 6 Click Score: 18    End of Session Equipment Utilized During Treatment: Gait belt;Oxygen Activity Tolerance: Patient tolerated treatment  well Patient left: in chair;with call bell/phone within reach;with chair alarm set Nurse Communication: Mobility status PT Visit Diagnosis: Unsteadiness on feet (R26.81);Other abnormalities of gait and mobility (R26.89);Repeated falls (R29.6);Pain     Time: 5009-3818 PT Time Calculation (min) (ACUTE ONLY): 19 min  Charges:  $Gait Training: 8-22 mins                    G Codes:       Elwyn Reach, PT [place] 02/28/2018, 1:32 PM

## 2018-02-28 NOTE — Care Management Note (Signed)
Case Management Note  Patient Details  Name: [name] MRN: [id number] Date of Birth: 1941/04/14  Subjective/Objective:  Sacral Fracture post fall                 Action/Plan: Patient lives at home with Spouse; Outpatient Primary MD for the patient is [name], MD; has private insurance with Medicare; pharmacy of choice is [facility] in [place]. Patient states that she does not have any problems getting her medication; has home oxygen - she does not remember the DME company at this time; patient is agreeable to [facility] services, choice offered, pt chose [place]; Cory with [facility] called for arrangements; CM will continue to follow for progression of care.  Expected Discharge Date:  Possibly 02/28/2018             Expected Discharge Plan:  [place]  In-House Referral:   [facility]  Discharge planning Services  CM Consult  Choice offered to:  Patient  HH Arranged:  RN, Disease Management, PT, OT, Nurse's Aide HH Agency:  [facility]  Status of Service:  In process, will continue to follow  Sherrilyn Rist [phone] 02/28/2018, 10:45 AM

## 2018-02-28 NOTE — Discharge Summary (Signed)
[name], is a 77 y.o. female  DOB Feb 03, 1941  MRN [id number].  Admission date:  02/26/2018  Admitting Physician  Truett Mainland, DO  Discharge Date:  02/28/2018   Primary MD  [name], MD  Recommendations for primary care physician for things to follow:  1)Fall precautions 2) take medications as prescribed 3) follow-up with your primary care doctor in 1 to 2 weeks for reevaluation and for work-up for possible osteoporosis including DEXA scan   Admission Diagnosis  Weakness [R53.1] Fall [W19.XXXA]   Discharge Diagnosis  Weakness [R53.1] Fall [W19.XXXA]    Principal Problem:   Closed sacral fracture/S2-S3 Active Problems:   COPD [place] D, frequent exacerbations   Hypertension   Hyperlipidemia   Hypothyroidism   Depression   Chronic respiratory failure with hypoxia (HCC)   Oral thrush   Leg weakness, bilateral   Fall at home, initial encounter      Past Medical History:  Diagnosis Date  . CHF (congestive heart failure) ([place])   . COPD (chronic obstructive pulmonary disease) ([place])    home O2  . Diastolic dysfunction   . Dyspnea   . History of nuclear stress test 09/23/2009   dipyridamole; normal pattern of perfusion; low risk scan   . History of tobacco abuse   . Hyperlipidemia   . Hypertension   . Hypothyroidism   . Mild depression ([place])   . Pneumonia     Past Surgical History:  Procedure Laterality Date  . BIOPSY  01/13/2017   Procedure: BIOPSY;  Surgeon: Daneil Dolin, MD;  Location: [facility];  Service: Endoscopy;;  right/left/sigmoid colon  . CATARACT EXTRACTION W/PHACO  09/08/2012   Procedure: CATARACT EXTRACTION PHACO AND INTRAOCULAR LENS PLACEMENT (IOC);  Surgeon: Tonny Branch, MD;  Location: [facility] ORS;  Service: Ophthalmology;  Laterality: Right;  CDE:19.67  . CATARACT EXTRACTION W/PHACO  09/19/2012   Procedure: CATARACT EXTRACTION PHACO AND INTRAOCULAR LENS PLACEMENT  (IOC);  Surgeon: Tonny Branch, MD;  Location: [facility] ORS;  Service: Ophthalmology;  Laterality: Left;  CDE:19.83  . COLONOSCOPY  2007   Dr. Gala Romney: internal hemorrhoids, benign polyps  . COLONOSCOPY N/A 01/13/2017   Procedure: COLONOSCOPY;  Surgeon: Daneil Dolin, MD;  Location: [facility];  Service: Endoscopy;  Laterality: N/A;  12:15pm  . ESOPHAGOGASTRODUODENOSCOPY  2007   Dr. Gala Romney: normal esophagus, non-critical Schatzki's ring not manipulated, normal small bowel biopsy  . ESOPHAGOGASTRODUODENOSCOPY (EGD) WITH ESOPHAGEAL DILATION N/A 10/16/2013   Dr. Fields:Schatzki's ring at the gastroesophageal junction s/p dilation/MODERATE Erosive gastritis. reactive gastropathy.   Marland Kitchen [place]  . right carpal tunnel release    . TONSILLECTOMY    . TOTAL ABDOMINAL HYSTERECTOMY  1980's  . TRANSTHORACIC ECHOCARDIOGRAM  09/23/2009   EF>55%; trace TR; normal LA & RA size; normal LV & RV size & systolic function        HPI  from the history and physical done on the day of admission:    Patient Coming From: home  Chief Complaint: weakness,  falling  HPI: [name] is a 77 y.o. female with a history of COPD with chronic respiratory failure on home O2, CHF with chronic diastolic dysfunction, hypertension, hypothyroidism, mild depression, hyperlipidemia.  Patient seen for progressive weakness that started 4 days and go and has been getting acutely worse.  Her weakness is to the point where she is unable to support her own weight.  She did have a fall a few weeks ago: She rolled out of bed at home and fell on her back did have some back pain and continues to have some mild residual lower lumbar back pain without radiation.  She denies any other focal weaknesses.  Her symptoms are bilateral, but she states they are worse than the left.  No other palliating or provoking factors.  Emergency Department Course: X-ray of back is normal.  CT of the head is normal.  Labs relatively normal.  EDP  discussed patient with neurosurgery, who would like the patient admitted to the medicine, patient sent to [facility] so they can consult on the patient, and an MRI of her back.    Hospital Course:     Brief Summary 77 y.o.femalewith a history of COPD with chronic respiratory failure on home O2,CHF with chronic diastolic dysfunction, hypertension, hypothyroidism, mild depression, hyperlipidemia transferred on 02/26/2018 from [facility] to [facility] with concerns about low back pain after sustaining a fall at home almost 3 weeks ago   Plan:- 1)S2-S3 traumatic/mechanical sacral fractures--- status post fall almost 3 weeks prior to admission, MRI lumbar spine with S2 -S3 (Sacral) Acute Traumatic/mechanical fractures extending into the left sacral ala, without significant neural impingement or significant spinal stenosis. Neurosurgical consult from Dr. Sherley Bounds appreciated, conservative nonsurgical approach advised, neurosurgeon advises sacroplasty in the future if pain control becomes challenging with mobilization, patient will need outpatient DEXA scan and other work-up for possible osteoporosis....   Empirically start calcium and vitamin D and Miacalcin, methocarbamol and PRN opiates for pain control, PT OT eval stated  2)COPD--- stable, no acute exacerbation at this time, continue bronchodilators  3)Constipation-no significant abdominal pain, continue laxatives as advised  4) hypothyroidism-stable, continue levothyroxine  5)HFpEF--- chronic diastolic dysfunction CHF, no acute exacerbation at this time, continue torsemide  Code Status : full   Disposition Plan  :Home with [facility]   Consults  :  NeuroSurgery  Discharge Condition: stable  Follow UP  Follow-up Information    [facility] [place] [facility] up.   Specialty:  Home Health Services Why:  They will do your home health care at your home Contact information: [address] [address] [phone]           Diet and Activity recommendation:  As advised  Discharge Instructions    Discharge Instructions    Call MD for:  difficulty breathing, headache or visual disturbances   Complete by:  As directed    Call MD for:  persistant dizziness or light-headedness   Complete by:  As directed    Call MD for:  persistant nausea and vomiting   Complete by:  As directed    Call MD for:  severe uncontrolled pain   Complete by:  As directed    Call MD for:  temperature >100.4   Complete by:  As directed    Diet - low sodium heart healthy   Complete by:  As directed    Discharge instructions   Complete by:  As directed  1)Fall precautions 2) take medications as prescribed 3) follow-up with your primary care doctor in 1 to 2 weeks for reevaluation and for work-up for possible osteoporosis including DEXA scan   Increase activity slowly   Complete by:  As directed         Discharge Medications     Allergies as of 02/28/2018      Reactions   Bee Venom Swelling      Medication List    STOP taking these medications   diltiazem 120 MG tablet Commonly known as:  CARDIZEM   naproxen sodium 220 MG tablet Commonly known as:  ALEVE   traMADol 50 MG tablet Commonly known as:  ULTRAM     TAKE these medications   acetaminophen 325 MG tablet Commonly known as:  TYLENOL Take 2 tablets (650 mg total) by mouth every 6 (six) hours as needed for mild pain (or Fever >/= 101). What changed:    medication strength  how much to take  reasons to take this   albuterol (2.5 MG/3ML) 0.083% nebulizer solution Commonly known as:  PROVENTIL Take 3 mLs (2.5 mg total) by nebulization 3 (three) times daily.   albuterol 108 (90 Base) MCG/ACT inhaler Commonly known as:  PROAIR HFA Inhale 2 puffs into the lungs every 6 (six) hours as needed for wheezing or shortness of breath. Shortness of breath   ALPRAZolam 0.25 MG tablet Commonly known as:   XANAX Take 1 tablet (0.25 mg total) by mouth 2 (two) times daily as needed for anxiety or sleep. What changed:    medication strength  how much to take  when to take this  reasons to take this  additional instructions   buPROPion 150 MG 24 hr tablet Commonly known as:  WELLBUTRIN XL Take 150 mg by mouth daily.   calcitonin (salmon) 200 UNIT/ACT nasal spray Commonly known as:  MIACALCIN/FORTICAL Place 1 spray into alternate nostrils daily. Start taking on:  03/01/2018   calcium-vitamin D 500-200 MG-UNIT tablet Commonly known as:  OSCAL WITH D Take 1 tablet by mouth 2 (two) times daily. What changed:  when to take this   carboxymethylcellulose 0.5 % Soln Commonly known as:  REFRESH PLUS Place 1 drop into both eyes daily as needed (dry eyes).   cetirizine 10 MG tablet Commonly known as:  ZYRTEC Take 10 mg by mouth daily as needed for allergies.   citalopram 10 MG tablet Commonly known as:  CELEXA Take 10 mg by mouth daily.   diltiazem 120 MG 24 hr capsule Commonly known as:  CARDIZEM CD Take 1 capsule (120 mg total) by mouth daily.   fluticasone furoate-vilanterol 100-25 MCG/INH Aepb Commonly known as:  BREO ELLIPTA Inhale 1 puff into the lungs daily.   HYDROcodone-acetaminophen 5-325 MG tablet Commonly known as:  NORCO/VICODIN Take 1 tablet by mouth every 6 (six) hours as needed for up to 5 days for moderate pain or severe pain.   levothyroxine 50 MCG tablet Commonly known as:  SYNTHROID, LEVOTHROID Take 50 mcg by mouth daily before breakfast.   methocarbamol 500 MG tablet Commonly known as:  ROBAXIN Take 1 tablet (500 mg total) by mouth 2 (two) times daily.   multivitamin capsule Take 1 capsule by mouth daily.   nystatin 100000 UNIT/ML suspension Commonly known as:  MYCOSTATIN 73ml QID swish and swallow   ondansetron 4 MG tablet Commonly known as:  ZOFRAN Take 1 tablet (4 mg total) by mouth every 6 (six) hours as needed for nausea.  OVER THE  COUNTER MEDICATION Place 2 drops in ear(s) daily as needed (ear wax).   pantoprazole 40 MG tablet Commonly known as:  PROTONIX Take 1 tablet (40 mg total) by mouth daily before breakfast.   polyethylene glycol packet Commonly known as:  MIRALAX / GLYCOLAX Take 17 g by mouth 2 (two) times daily.   Potassium 99 MG Tabs Take 99 mg by mouth daily.   pravastatin 40 MG tablet Commonly known as:  PRAVACHOL Take 1 tablet (40 mg total) by mouth at bedtime. What changed:  additional instructions   predniSONE 10 MG tablet Commonly known as:  DELTASONE Take 10 mg by mouth 4 (four) times daily as needed (COPD Flare up). For 10 days   senna-docusate 8.6-50 MG tablet Commonly known as:  Senokot-S Take 2 tablets by mouth at bedtime.   tiotropium 18 MCG inhalation capsule Commonly known as:  SPIRIVA Place 1 capsule (18 mcg total) into inhaler and inhale daily.   torsemide 20 MG tablet Commonly known as:  DEMADEX Take 1 tablet (20 mg total) by mouth once. What changed:    when to take this  reasons to take this   VITAMIN C PO Take 1 tablet by mouth daily.       Major procedures and Radiology Reports - PLEASE review detailed and final reports for all details, in brief   Dg Lumbar Spine Complete  Result Date: 02/26/2018 CLINICAL DATA:  Lower back pain after fall a week ago. EXAM: LUMBAR SPINE - COMPLETE 4+ VIEW COMPARISON:  Lumbar spine x-rays dated December 18, 2013. FINDINGS: Five lumbar type vertebral bodies. No acute fracture or subluxation. Vertebral body heights are preserved. Trace anterolisthesis at L4-L5, unchanged. Mild disc height loss throughout the lumbar spine, sparing L5-S1. Moderate lumbar facet arthropathy. Osteopenia. Aortoiliac atherosclerotic vascular disease. IMPRESSION: 1.  No acute osseous abnormality. 2. Mild degenerative disc disease and moderate facet arthropathy throughout the lumbar spine. Electronically Signed   By: Titus Dubin M.D.   On: 02/26/2018 10:11    Ct Head Wo Contrast  Result Date: 02/26/2018 CLINICAL DATA:  77 year old with weakness. EXAM: CT HEAD WITHOUT CONTRAST TECHNIQUE: Contiguous axial images were obtained from the base of the skull through the vertex without intravenous contrast. COMPARISON:  None. FINDINGS: Brain: Cerebral atrophy. Large sulcus along the left posterior vertex probably related to the atrophy. Diffuse low-density throughout the white matter is suggestive for chronic changes. Ventricles are prominent and likely secondary to atrophy. No evidence for acute hemorrhage, mass lesion, midline shift, hydrocephalus or large infarct. Vascular: Vascular calcifications. Skull: Normal. Negative for fracture or focal lesion. Sinuses/Orbits: No acute finding. Other: None. IMPRESSION: No acute intracranial abnormality. Cerebral atrophy and evidence for chronic small vessel ischemic changes. Electronically Signed   By: Markus Daft M.D.   On: 02/26/2018 09:32   Mr Lumbar Spine Wo Contrast  Result Date: 02/26/2018 CLINICAL DATA:  Initial evaluation for acute back pain with left leg pain status post fall 2 weeks ago. EXAM: MRI LUMBAR SPINE WITHOUT CONTRAST TECHNIQUE: Multiplanar, multisequence MR imaging of the lumbar spine was performed. No intravenous contrast was administered. COMPARISON:  Prior radiograph from earlier the same day. FINDINGS: Segmentation: Normal segmentation. Lowest well-formed disc labeled the L5-S1 level. Alignment: Mild levoscoliosis with rotary component. 2 mm anterolisthesis of L3 on L4, with 3 mm anterolisthesis of L4 on L5, chronic and facet mediated. Alignment otherwise normal with preservation of the normal lumbar lordosis. Vertebrae: There is an acute to subacute appearing fracture involving the distal  sacrum at the level of the S3/S4 segment (series 10, image 19). Extension into the left sacral ala (series 10, image 12). Finding most likely cause of patient's symptoms. This is benign/mechanical in appearance, with  no underlying pathologic lesion. Vertebral body heights maintained without evidence for acute or chronic fracture. Underlying bone marrow signal intensity within normal limits. No discrete or worrisome osseous lesions. No other abnormal marrow edema. Conus medullaris and cauda equina: Conus extends to the L1 level. Conus and cauda equina appear normal. Paraspinal and other soft tissues: Paraspinous soft tissues demonstrate no acute abnormality. Few scattered T2 hyperintense cyst noted within the kidneys bilaterally. Visualized visceral structures otherwise unremarkable. Disc levels: T12-L1: Mild disc bulge with superimposed tiny left paracentral disc protrusion. No stenosis. L1-2: Mild facet hypertrophy.  No stenosis. L2-3: Mild disc bulge. Mild facet and ligamentum flavum hypertrophy. No significant stenosis. L3-4: Trace anterolisthesis. Mild diffuse disc bulge. Moderate facet and ligament flavum hypertrophy. Small bilateral reactive joint effusions. Resultant mild spinal stenosis. Mild right L3 foraminal narrowing. L4-5: Anterolisthesis. Broad posterior disc bulge with disc desiccation. Moderate facet and ligament flavum hypertrophy. Resultant mild to moderate spinal stenosis. Foramina are elongated but remain patent. L5-S1:  Mild facet hypertrophy.  No stenosis. IMPRESSION: 1. Acute fracture involving the distal sacrum with extension into the left sacral ala. Finding is benign/mechanical in nature, and most likely related to recent fall. 2. Mild chronic facet mediated anterolisthesis of L3 on L4 and L4 on L5 with resultant mild to moderate spinal stenosis, most pronounced at L4-5. No neural impingement. Electronically Signed   By: Jeannine Boga M.D.   On: 02/26/2018 22:14   Dg Hip Unilat With Pelvis Min 4 Views Left  Result Date: 02/26/2018 CLINICAL DATA:  Left hip pain after fall last week. EXAM: DG HIP (WITH OR WITHOUT PELVIS) 4+V LEFT COMPARISON:  CT abdomen pelvis dated September 07, 2007. FINDINGS:  No acute fracture or dislocation. Joint spaces are preserved. Pubic symphysis and sacroiliac joints are intact. Osteopenia. Soft tissues are unremarkable. Vascular calcifications. IMPRESSION: 1.  No acute osseous abnormality. Electronically Signed   By: Titus Dubin M.D.   On: 02/26/2018 10:07    Micro Results   No results found for this or any previous visit (from the past 240 hour(s)).  Today   Subjective    [name] today has no new complaints, buttock/low back pain is better, ambulated with therapist, no dizziness no dyspnea on exertion          Patient has been seen and examined prior to discharge   Objective   Blood pressure 126/62, pulse 68, temperature (!) 97.5 F (36.4 C), temperature source Oral, resp. rate 20, height 5' (1.524 m), weight 47.4 kg (104 lb 6.4 oz), SpO2 99 %.   Intake/Output Summary (Last 24 hours) at 02/28/2018 1235 Last data filed at 02/28/2018 0459 Gross per 24 hour  Intake -  Output 750 ml  Net -750 ml    Exam Gen:- Awake Alert,  In no apparent distress  HEENT:- [place].AT, No sclera icterus Neck-Supple Neck,No JVD,.  Lungs-  CTAB , good air movement CV- S1, S2 normal Abd-  +ve B.Sounds, Abd Soft, No tenderness,    Extremity/Skin:- No  edema,   good pulses Psych-affect is appropriate, oriented x3 Neuro-no new focal deficits, no tremors Back/Lumbar Spine--- improved pain with palpation and range of motion in the lower sacral areas   Data Review   CBC w Diff:  Lab Results  Component Value Date  WBC 6.9 02/26/2018   HGB 11.5 (L) 02/26/2018   HCT 37.4 02/26/2018   PLT 265 02/26/2018   LYMPHOPCT 17 02/26/2018   MONOPCT 11 02/26/2018   EOSPCT 7 02/26/2018   BASOPCT 0 02/26/2018    CMP:  Lab Results  Component Value Date   NA 145 02/26/2018   K 3.7 02/26/2018   CL 105 02/26/2018   CO2 31 02/26/2018   BUN 18 02/26/2018   CREATININE 0.92 02/26/2018   CREATININE 0.74 06/17/2015   PROT 6.3 (L) 02/26/2018   ALBUMIN 3.8 02/26/2018    BILITOT 0.9 02/26/2018   ALKPHOS 73 02/26/2018   AST 16 02/26/2018   ALT 15 02/26/2018    Total Discharge time is about 33 minutes  Roxan Hockey M.D on 02/28/2018 at 12:35 PM  [facility]   Office  [phone]  Voice Recognition Viviann Spare dictation system was used to create this note, attempts have been made to correct errors. Please contact the author with questions and/or clarifications.

## 2018-02-28 NOTE — Discharge Instructions (Signed)
1)Fall precautions 2) take medications as prescribed 3) follow-up with your primary care doctor in 1 to 2 weeks for reevaluation and for work-up for possible osteoporosis including DEXA scan

## 2018-02-28 NOTE — Consult Note (Signed)
   [facility] Inpatient Consult   02/28/2018  [name] 1940-10-24 [id number]   Patient screened for potential [place] Management services. Patient is in the [place] [facility] the [place] Management services under patient's Medicare plan. Patient has been recommended for [facility] programs. Will follow for appropriateness.   Please note MD notes 02/27/18:  77 y.o.femalewith a history of COPD with chronic respiratory failure on home O2,CHF with chronic diastolic dysfunction, hypertension, hypothyroidism, mild depression, hyperlipidemia transferred on 02/26/2018 from [facility] to [facility] with concerns about low back pain after sustaining a fall at home almost 3 weeks ago.  For questions contact:   Natividad Brood, RN BSN [facility] Liaison  [phone] business mobile phone Toll free office [phone]

## 2018-03-01 ENCOUNTER — Other Ambulatory Visit: Payer: Self-pay | Admitting: *Deleted

## 2018-03-01 DIAGNOSIS — D649 Anemia, unspecified: Secondary | ICD-10-CM | POA: Diagnosis not present

## 2018-03-01 DIAGNOSIS — E782 Mixed hyperlipidemia: Secondary | ICD-10-CM | POA: Diagnosis not present

## 2018-03-01 DIAGNOSIS — E039 Hypothyroidism, unspecified: Secondary | ICD-10-CM | POA: Diagnosis not present

## 2018-03-01 DIAGNOSIS — I4891 Unspecified atrial fibrillation: Secondary | ICD-10-CM | POA: Diagnosis not present

## 2018-03-01 DIAGNOSIS — F419 Anxiety disorder, unspecified: Secondary | ICD-10-CM | POA: Diagnosis not present

## 2018-03-01 DIAGNOSIS — F331 Major depressive disorder, recurrent, moderate: Secondary | ICD-10-CM | POA: Diagnosis not present

## 2018-03-01 DIAGNOSIS — Z6821 Body mass index (BMI) 21.0-21.9, adult: Secondary | ICD-10-CM | POA: Diagnosis not present

## 2018-03-01 DIAGNOSIS — N183 Chronic kidney disease, stage 3 (moderate): Secondary | ICD-10-CM | POA: Diagnosis not present

## 2018-03-01 DIAGNOSIS — M533 Sacrococcygeal disorders, not elsewhere classified: Secondary | ICD-10-CM | POA: Diagnosis not present

## 2018-03-01 DIAGNOSIS — Z0000 Encounter for general adult medical examination without abnormal findings: Secondary | ICD-10-CM | POA: Diagnosis not present

## 2018-03-01 DIAGNOSIS — G4700 Insomnia, unspecified: Secondary | ICD-10-CM | POA: Diagnosis not present

## 2018-03-01 DIAGNOSIS — M545 Low back pain: Secondary | ICD-10-CM | POA: Diagnosis not present

## 2018-03-01 DIAGNOSIS — M48061 Spinal stenosis, lumbar region without neurogenic claudication: Secondary | ICD-10-CM | POA: Diagnosis not present

## 2018-03-01 DIAGNOSIS — S3210XD Unspecified fracture of sacrum, subsequent encounter for fracture with routine healing: Secondary | ICD-10-CM | POA: Diagnosis not present

## 2018-03-01 DIAGNOSIS — J449 Chronic obstructive pulmonary disease, unspecified: Secondary | ICD-10-CM | POA: Diagnosis not present

## 2018-03-01 NOTE — Consult Note (Signed)
Made aware by Tommi Rumps with [facility] of patient's enrollment with [place] program.   Telephone call made to Mrs. Marcelli to explain that [place] Management could potentially assist if pharmacy or transportation needs are identified while on the [place] program. Also discussed that [place] Management could potentially receive referral if ongoing community case management needs are identified post [place].   Mrs. Mersch expresses appreciation of the current [place] services she is receiving.  Will send notification of [facility] enrollment to [place] Management office.    Marthenia Rolling, MSN-Ed, RN,BSN [facility] Liaison [phone]

## 2018-03-02 DIAGNOSIS — S3210XD Unspecified fracture of sacrum, subsequent encounter for fracture with routine healing: Secondary | ICD-10-CM | POA: Diagnosis not present

## 2018-03-02 DIAGNOSIS — M48061 Spinal stenosis, lumbar region without neurogenic claudication: Secondary | ICD-10-CM | POA: Diagnosis not present

## 2018-03-03 ENCOUNTER — Other Ambulatory Visit: Payer: Self-pay

## 2018-03-03 DIAGNOSIS — M48061 Spinal stenosis, lumbar region without neurogenic claudication: Secondary | ICD-10-CM | POA: Diagnosis not present

## 2018-03-03 DIAGNOSIS — S3210XD Unspecified fracture of sacrum, subsequent encounter for fracture with routine healing: Secondary | ICD-10-CM | POA: Diagnosis not present

## 2018-03-03 NOTE — Patient Outreach (Signed)
[facility]) Care Management  03/03/2018  [name] 1941/04/09 [id number]  EMMI: COPD red alert Referral date: 03/03/18 Referral reason: More chest pain: yes, More mucous: yes, Mucous color change: yes, Rescue inhaler used x1, Questions/ problems with medication: yes Insurance: medicare Day # 1  Telephone call to patient regarding EMMI copd red alert. HIPAA verified with patient. Explained reason for call. Patient states she is not feeling bad or having any symptoms.  Patient states, " I am just weak.  I don't have a lot of energy."  Patient confirms she was discharged from the hospital on 02/28/18 due to a fall. Patient states she fell out of the bed and fractured her tailbone. Patient reports [facility] home [facility] has contacted her and will begin services.  Patient state she had an appointment scheduled with her primary MD but was to weak to go.  RNCM advised patient to call her primary MD office today and reschedule appointment. Discussed importance of having a follow up visit with primary MD post hospital discharge.  Patient states she has her medications and takes them as prescribed. States she is on oxygen at [employer]. Patient reports she uses her nebulizer as needed. RNCM discussed and offered [facility] care management services. Patient declined.  Stating she thinks she will be fine with [facility] home health.  RNCM offered to send patient contact information for [facility] care management.  Patient verbally agreed.  RNCM discussed symptoms of COPD. Advised to call doctor for non emergent symptoms and call 911 for emergent symptoms. Patient verbalized understanding.  RNCM informed patient she would receive additional EMMI automated calls.  Patient verbally agreed to ongoing EMMI automated calls.   PLAN; RNCM will close patient due to patient refusing services.  RNCM will send patient closure notification RNCM will send patient [facility] brochure/ magnet  Quinn Plowman RN,BSN,CCM [facility] Telephonic   [phone]   .    PLAN:

## 2018-03-04 DIAGNOSIS — S3210XD Unspecified fracture of sacrum, subsequent encounter for fracture with routine healing: Secondary | ICD-10-CM | POA: Diagnosis not present

## 2018-03-04 DIAGNOSIS — M48061 Spinal stenosis, lumbar region without neurogenic claudication: Secondary | ICD-10-CM | POA: Diagnosis not present

## 2018-03-07 ENCOUNTER — Other Ambulatory Visit: Payer: Self-pay

## 2018-03-07 DIAGNOSIS — S3210XD Unspecified fracture of sacrum, subsequent encounter for fracture with routine healing: Secondary | ICD-10-CM | POA: Diagnosis not present

## 2018-03-07 DIAGNOSIS — M48061 Spinal stenosis, lumbar region without neurogenic claudication: Secondary | ICD-10-CM | POA: Diagnosis not present

## 2018-03-07 NOTE — Patient Outreach (Signed)
[place] [facility]) Care Management  03/07/2018  AUBREANA CORNACCHIA 22-Jan-1941 [id number]  EMMI: COPD red alert Referral date: 03/07/18 Referral reason: feeling worse: yes, MUCOUS color change: yes Insurance: Medicare Day # 3  Telephone call to patient regarding EMMI COPD red alert referral. Unable to reach patient. HIPAA compliant voice message left with call back phone number.   PLAN: RNCM will attempt 2nd telephone call to patient within 4 business days. RNCM will send outreach letter.   Quinn Plowman RN,BSN,CCM [facility] Telephonic  [phone]

## 2018-03-09 ENCOUNTER — Other Ambulatory Visit: Payer: Self-pay

## 2018-03-09 DIAGNOSIS — M48061 Spinal stenosis, lumbar region without neurogenic claudication: Secondary | ICD-10-CM | POA: Diagnosis not present

## 2018-03-09 DIAGNOSIS — S3210XD Unspecified fracture of sacrum, subsequent encounter for fracture with routine healing: Secondary | ICD-10-CM | POA: Diagnosis not present

## 2018-03-09 NOTE — Patient Outreach (Signed)
[facility] Care Management  03/09/2018  RHEDA KASSAB May 22, 1941 [id number]  EMMI: COPD red alert Referral date: 03/07/18 Referral reason: feeling worse: yes, MUCOUS color change: yes Insurance: Medicare Day # 3   Telephone call to patient regarding EMMI COPD red alert. HIPAA verified with patient.  Explained reason for call. Patient states she is not having any new symptoms. Reports she is feeling about the same.  Patient states she has a follow up appointment with her primary MD on 03/11/18.  Patient states she continues to take her medications as prescribed and she continues to receive home health services. RNCM discussed and offered [facility] care management services. Patient declined.  States she does not feel she needs any additional services at this time.  Informed patient she would continue to receive EMMI automated phone calls.  Patient EMMI automated phone call to be discontinued.  RNCM advised patient to notify MD of any changes in condition prior to scheduled appointment. RNCM verified patient aware of 911 services for urgent/ emergent needs. RNCM recently sent [facility] contact information to patient along with 24 hour nurse advise line phone number.   PLAN; RNCM will close patient due to refusal of services.  RNCM will send closure notification to patients primary MD.   Quinn Plowman RN,BSN,CCM [facility] Telephonic  [phone]

## 2018-03-11 DIAGNOSIS — S3210XD Unspecified fracture of sacrum, subsequent encounter for fracture with routine healing: Secondary | ICD-10-CM | POA: Diagnosis not present

## 2018-03-11 DIAGNOSIS — M48061 Spinal stenosis, lumbar region without neurogenic claudication: Secondary | ICD-10-CM | POA: Diagnosis not present

## 2018-03-14 DIAGNOSIS — S3210XD Unspecified fracture of sacrum, subsequent encounter for fracture with routine healing: Secondary | ICD-10-CM | POA: Diagnosis not present

## 2018-03-14 DIAGNOSIS — M48061 Spinal stenosis, lumbar region without neurogenic claudication: Secondary | ICD-10-CM | POA: Diagnosis not present

## 2018-03-15 DIAGNOSIS — S3210XD Unspecified fracture of sacrum, subsequent encounter for fracture with routine healing: Secondary | ICD-10-CM | POA: Diagnosis not present

## 2018-03-15 DIAGNOSIS — M48061 Spinal stenosis, lumbar region without neurogenic claudication: Secondary | ICD-10-CM | POA: Diagnosis not present

## 2018-03-16 DIAGNOSIS — S3210XD Unspecified fracture of sacrum, subsequent encounter for fracture with routine healing: Secondary | ICD-10-CM | POA: Diagnosis not present

## 2018-03-16 DIAGNOSIS — M48061 Spinal stenosis, lumbar region without neurogenic claudication: Secondary | ICD-10-CM | POA: Diagnosis not present

## 2018-03-17 DIAGNOSIS — M48061 Spinal stenosis, lumbar region without neurogenic claudication: Secondary | ICD-10-CM | POA: Diagnosis not present

## 2018-03-17 DIAGNOSIS — S3210XD Unspecified fracture of sacrum, subsequent encounter for fracture with routine healing: Secondary | ICD-10-CM | POA: Diagnosis not present

## 2018-03-21 DIAGNOSIS — G4700 Insomnia, unspecified: Secondary | ICD-10-CM | POA: Diagnosis not present

## 2018-03-21 DIAGNOSIS — F331 Major depressive disorder, recurrent, moderate: Secondary | ICD-10-CM | POA: Diagnosis not present

## 2018-03-21 DIAGNOSIS — E039 Hypothyroidism, unspecified: Secondary | ICD-10-CM | POA: Diagnosis not present

## 2018-03-21 DIAGNOSIS — D649 Anemia, unspecified: Secondary | ICD-10-CM | POA: Diagnosis not present

## 2018-03-21 DIAGNOSIS — I4891 Unspecified atrial fibrillation: Secondary | ICD-10-CM | POA: Diagnosis not present

## 2018-03-21 DIAGNOSIS — F419 Anxiety disorder, unspecified: Secondary | ICD-10-CM | POA: Diagnosis not present

## 2018-03-21 DIAGNOSIS — M48061 Spinal stenosis, lumbar region without neurogenic claudication: Secondary | ICD-10-CM | POA: Diagnosis not present

## 2018-03-21 DIAGNOSIS — M545 Low back pain: Secondary | ICD-10-CM | POA: Diagnosis not present

## 2018-03-21 DIAGNOSIS — S3210XD Unspecified fracture of sacrum, subsequent encounter for fracture with routine healing: Secondary | ICD-10-CM | POA: Diagnosis not present

## 2018-03-21 DIAGNOSIS — E782 Mixed hyperlipidemia: Secondary | ICD-10-CM | POA: Diagnosis not present

## 2018-03-21 DIAGNOSIS — J449 Chronic obstructive pulmonary disease, unspecified: Secondary | ICD-10-CM | POA: Diagnosis not present

## 2018-03-22 DIAGNOSIS — M48061 Spinal stenosis, lumbar region without neurogenic claudication: Secondary | ICD-10-CM | POA: Diagnosis not present

## 2018-03-22 DIAGNOSIS — S3210XD Unspecified fracture of sacrum, subsequent encounter for fracture with routine healing: Secondary | ICD-10-CM | POA: Diagnosis not present

## 2018-03-29 DIAGNOSIS — M48061 Spinal stenosis, lumbar region without neurogenic claudication: Secondary | ICD-10-CM | POA: Diagnosis not present

## 2018-03-29 DIAGNOSIS — S3210XD Unspecified fracture of sacrum, subsequent encounter for fracture with routine healing: Secondary | ICD-10-CM | POA: Diagnosis not present

## 2018-04-05 DIAGNOSIS — M48061 Spinal stenosis, lumbar region without neurogenic claudication: Secondary | ICD-10-CM | POA: Diagnosis not present

## 2018-04-05 DIAGNOSIS — S3210XD Unspecified fracture of sacrum, subsequent encounter for fracture with routine healing: Secondary | ICD-10-CM | POA: Diagnosis not present

## 2018-04-15 DIAGNOSIS — Z9981 Dependence on supplemental oxygen: Secondary | ICD-10-CM | POA: Diagnosis not present

## 2018-04-15 DIAGNOSIS — J9611 Chronic respiratory failure with hypoxia: Secondary | ICD-10-CM | POA: Diagnosis not present

## 2018-04-15 DIAGNOSIS — F329 Major depressive disorder, single episode, unspecified: Secondary | ICD-10-CM | POA: Diagnosis not present

## 2018-04-15 DIAGNOSIS — E039 Hypothyroidism, unspecified: Secondary | ICD-10-CM | POA: Diagnosis not present

## 2018-04-15 DIAGNOSIS — J449 Chronic obstructive pulmonary disease, unspecified: Secondary | ICD-10-CM | POA: Diagnosis not present

## 2018-04-15 DIAGNOSIS — R1310 Dysphagia, unspecified: Secondary | ICD-10-CM | POA: Diagnosis not present

## 2018-04-15 DIAGNOSIS — Z7952 Long term (current) use of systemic steroids: Secondary | ICD-10-CM | POA: Diagnosis not present

## 2018-04-15 DIAGNOSIS — B370 Candidal stomatitis: Secondary | ICD-10-CM | POA: Diagnosis not present

## 2018-04-15 DIAGNOSIS — S3210XD Unspecified fracture of sacrum, subsequent encounter for fracture with routine healing: Secondary | ICD-10-CM | POA: Diagnosis not present

## 2018-04-15 DIAGNOSIS — I110 Hypertensive heart disease with heart failure: Secondary | ICD-10-CM | POA: Diagnosis not present

## 2018-04-15 DIAGNOSIS — I5032 Chronic diastolic (congestive) heart failure: Secondary | ICD-10-CM | POA: Diagnosis not present

## 2018-04-18 DIAGNOSIS — Z7952 Long term (current) use of systemic steroids: Secondary | ICD-10-CM | POA: Diagnosis not present

## 2018-04-18 DIAGNOSIS — Z9981 Dependence on supplemental oxygen: Secondary | ICD-10-CM | POA: Diagnosis not present

## 2018-04-18 DIAGNOSIS — J449 Chronic obstructive pulmonary disease, unspecified: Secondary | ICD-10-CM | POA: Diagnosis not present

## 2018-04-18 DIAGNOSIS — S3210XD Unspecified fracture of sacrum, subsequent encounter for fracture with routine healing: Secondary | ICD-10-CM | POA: Diagnosis not present

## 2018-04-18 DIAGNOSIS — R1310 Dysphagia, unspecified: Secondary | ICD-10-CM | POA: Diagnosis not present

## 2018-04-18 DIAGNOSIS — B370 Candidal stomatitis: Secondary | ICD-10-CM | POA: Diagnosis not present

## 2018-04-21 DIAGNOSIS — R1310 Dysphagia, unspecified: Secondary | ICD-10-CM | POA: Diagnosis not present

## 2018-04-21 DIAGNOSIS — B370 Candidal stomatitis: Secondary | ICD-10-CM | POA: Diagnosis not present

## 2018-04-21 DIAGNOSIS — J449 Chronic obstructive pulmonary disease, unspecified: Secondary | ICD-10-CM | POA: Diagnosis not present

## 2018-04-21 DIAGNOSIS — S3210XD Unspecified fracture of sacrum, subsequent encounter for fracture with routine healing: Secondary | ICD-10-CM | POA: Diagnosis not present

## 2018-04-21 DIAGNOSIS — Z7952 Long term (current) use of systemic steroids: Secondary | ICD-10-CM | POA: Diagnosis not present

## 2018-04-21 DIAGNOSIS — Z9981 Dependence on supplemental oxygen: Secondary | ICD-10-CM | POA: Diagnosis not present

## 2018-04-25 DIAGNOSIS — Z9981 Dependence on supplemental oxygen: Secondary | ICD-10-CM | POA: Diagnosis not present

## 2018-04-25 DIAGNOSIS — S3210XD Unspecified fracture of sacrum, subsequent encounter for fracture with routine healing: Secondary | ICD-10-CM | POA: Diagnosis not present

## 2018-04-25 DIAGNOSIS — Z7952 Long term (current) use of systemic steroids: Secondary | ICD-10-CM | POA: Diagnosis not present

## 2018-04-25 DIAGNOSIS — R1310 Dysphagia, unspecified: Secondary | ICD-10-CM | POA: Diagnosis not present

## 2018-04-25 DIAGNOSIS — B370 Candidal stomatitis: Secondary | ICD-10-CM | POA: Diagnosis not present

## 2018-04-25 DIAGNOSIS — J449 Chronic obstructive pulmonary disease, unspecified: Secondary | ICD-10-CM | POA: Diagnosis not present

## 2018-04-27 DIAGNOSIS — S3210XD Unspecified fracture of sacrum, subsequent encounter for fracture with routine healing: Secondary | ICD-10-CM | POA: Diagnosis not present

## 2018-04-27 DIAGNOSIS — J449 Chronic obstructive pulmonary disease, unspecified: Secondary | ICD-10-CM | POA: Diagnosis not present

## 2018-04-27 DIAGNOSIS — Z7952 Long term (current) use of systemic steroids: Secondary | ICD-10-CM | POA: Diagnosis not present

## 2018-04-27 DIAGNOSIS — Z9981 Dependence on supplemental oxygen: Secondary | ICD-10-CM | POA: Diagnosis not present

## 2018-04-27 DIAGNOSIS — R1310 Dysphagia, unspecified: Secondary | ICD-10-CM | POA: Diagnosis not present

## 2018-04-27 DIAGNOSIS — B370 Candidal stomatitis: Secondary | ICD-10-CM | POA: Diagnosis not present

## 2018-04-28 DIAGNOSIS — S3210XD Unspecified fracture of sacrum, subsequent encounter for fracture with routine healing: Secondary | ICD-10-CM | POA: Diagnosis not present

## 2018-04-28 DIAGNOSIS — B370 Candidal stomatitis: Secondary | ICD-10-CM | POA: Diagnosis not present

## 2018-04-28 DIAGNOSIS — R1310 Dysphagia, unspecified: Secondary | ICD-10-CM | POA: Diagnosis not present

## 2018-04-28 DIAGNOSIS — J449 Chronic obstructive pulmonary disease, unspecified: Secondary | ICD-10-CM | POA: Diagnosis not present

## 2018-04-28 DIAGNOSIS — Z9981 Dependence on supplemental oxygen: Secondary | ICD-10-CM | POA: Diagnosis not present

## 2018-04-28 DIAGNOSIS — Z7952 Long term (current) use of systemic steroids: Secondary | ICD-10-CM | POA: Diagnosis not present

## 2018-05-02 DIAGNOSIS — Z7952 Long term (current) use of systemic steroids: Secondary | ICD-10-CM | POA: Diagnosis not present

## 2018-05-02 DIAGNOSIS — J449 Chronic obstructive pulmonary disease, unspecified: Secondary | ICD-10-CM | POA: Diagnosis not present

## 2018-05-02 DIAGNOSIS — R1310 Dysphagia, unspecified: Secondary | ICD-10-CM | POA: Diagnosis not present

## 2018-05-02 DIAGNOSIS — B370 Candidal stomatitis: Secondary | ICD-10-CM | POA: Diagnosis not present

## 2018-05-02 DIAGNOSIS — Z9981 Dependence on supplemental oxygen: Secondary | ICD-10-CM | POA: Diagnosis not present

## 2018-05-02 DIAGNOSIS — S3210XD Unspecified fracture of sacrum, subsequent encounter for fracture with routine healing: Secondary | ICD-10-CM | POA: Diagnosis not present

## 2018-05-05 DIAGNOSIS — J449 Chronic obstructive pulmonary disease, unspecified: Secondary | ICD-10-CM | POA: Diagnosis not present

## 2018-05-05 DIAGNOSIS — Z9981 Dependence on supplemental oxygen: Secondary | ICD-10-CM | POA: Diagnosis not present

## 2018-05-05 DIAGNOSIS — S3210XD Unspecified fracture of sacrum, subsequent encounter for fracture with routine healing: Secondary | ICD-10-CM | POA: Diagnosis not present

## 2018-05-05 DIAGNOSIS — Z7952 Long term (current) use of systemic steroids: Secondary | ICD-10-CM | POA: Diagnosis not present

## 2018-05-05 DIAGNOSIS — B370 Candidal stomatitis: Secondary | ICD-10-CM | POA: Diagnosis not present

## 2018-05-05 DIAGNOSIS — R1310 Dysphagia, unspecified: Secondary | ICD-10-CM | POA: Diagnosis not present

## 2018-05-08 DIAGNOSIS — J9611 Chronic respiratory failure with hypoxia: Secondary | ICD-10-CM | POA: Diagnosis not present

## 2018-05-08 DIAGNOSIS — F329 Major depressive disorder, single episode, unspecified: Secondary | ICD-10-CM | POA: Diagnosis not present

## 2018-05-08 DIAGNOSIS — I5032 Chronic diastolic (congestive) heart failure: Secondary | ICD-10-CM | POA: Diagnosis not present

## 2018-05-08 DIAGNOSIS — J449 Chronic obstructive pulmonary disease, unspecified: Secondary | ICD-10-CM | POA: Diagnosis not present

## 2018-05-08 DIAGNOSIS — Z7952 Long term (current) use of systemic steroids: Secondary | ICD-10-CM | POA: Diagnosis not present

## 2018-05-08 DIAGNOSIS — S3210XD Unspecified fracture of sacrum, subsequent encounter for fracture with routine healing: Secondary | ICD-10-CM | POA: Diagnosis not present

## 2018-05-08 DIAGNOSIS — E039 Hypothyroidism, unspecified: Secondary | ICD-10-CM | POA: Diagnosis not present

## 2018-05-08 DIAGNOSIS — I110 Hypertensive heart disease with heart failure: Secondary | ICD-10-CM | POA: Diagnosis not present

## 2018-05-08 DIAGNOSIS — Z9981 Dependence on supplemental oxygen: Secondary | ICD-10-CM | POA: Diagnosis not present

## 2018-05-08 DIAGNOSIS — R1310 Dysphagia, unspecified: Secondary | ICD-10-CM | POA: Diagnosis not present

## 2018-05-08 DIAGNOSIS — B370 Candidal stomatitis: Secondary | ICD-10-CM | POA: Diagnosis not present

## 2018-05-10 DIAGNOSIS — B370 Candidal stomatitis: Secondary | ICD-10-CM | POA: Diagnosis not present

## 2018-05-10 DIAGNOSIS — S3210XD Unspecified fracture of sacrum, subsequent encounter for fracture with routine healing: Secondary | ICD-10-CM | POA: Diagnosis not present

## 2018-05-10 DIAGNOSIS — Z9981 Dependence on supplemental oxygen: Secondary | ICD-10-CM | POA: Diagnosis not present

## 2018-05-10 DIAGNOSIS — Z7952 Long term (current) use of systemic steroids: Secondary | ICD-10-CM | POA: Diagnosis not present

## 2018-05-10 DIAGNOSIS — J449 Chronic obstructive pulmonary disease, unspecified: Secondary | ICD-10-CM | POA: Diagnosis not present

## 2018-05-10 DIAGNOSIS — R1310 Dysphagia, unspecified: Secondary | ICD-10-CM | POA: Diagnosis not present

## 2018-05-12 DIAGNOSIS — B370 Candidal stomatitis: Secondary | ICD-10-CM | POA: Diagnosis not present

## 2018-05-12 DIAGNOSIS — S3210XD Unspecified fracture of sacrum, subsequent encounter for fracture with routine healing: Secondary | ICD-10-CM | POA: Diagnosis not present

## 2018-05-12 DIAGNOSIS — J449 Chronic obstructive pulmonary disease, unspecified: Secondary | ICD-10-CM | POA: Diagnosis not present

## 2018-05-12 DIAGNOSIS — Z7952 Long term (current) use of systemic steroids: Secondary | ICD-10-CM | POA: Diagnosis not present

## 2018-05-12 DIAGNOSIS — Z9981 Dependence on supplemental oxygen: Secondary | ICD-10-CM | POA: Diagnosis not present

## 2018-05-12 DIAGNOSIS — R1310 Dysphagia, unspecified: Secondary | ICD-10-CM | POA: Diagnosis not present

## 2018-05-16 DIAGNOSIS — R1310 Dysphagia, unspecified: Secondary | ICD-10-CM | POA: Diagnosis not present

## 2018-05-16 DIAGNOSIS — S3210XD Unspecified fracture of sacrum, subsequent encounter for fracture with routine healing: Secondary | ICD-10-CM | POA: Diagnosis not present

## 2018-05-16 DIAGNOSIS — B370 Candidal stomatitis: Secondary | ICD-10-CM | POA: Diagnosis not present

## 2018-05-16 DIAGNOSIS — Z7952 Long term (current) use of systemic steroids: Secondary | ICD-10-CM | POA: Diagnosis not present

## 2018-05-16 DIAGNOSIS — J449 Chronic obstructive pulmonary disease, unspecified: Secondary | ICD-10-CM | POA: Diagnosis not present

## 2018-05-16 DIAGNOSIS — Z9981 Dependence on supplemental oxygen: Secondary | ICD-10-CM | POA: Diagnosis not present

## 2018-05-19 DIAGNOSIS — R1310 Dysphagia, unspecified: Secondary | ICD-10-CM | POA: Diagnosis not present

## 2018-05-19 DIAGNOSIS — Z7952 Long term (current) use of systemic steroids: Secondary | ICD-10-CM | POA: Diagnosis not present

## 2018-05-19 DIAGNOSIS — J449 Chronic obstructive pulmonary disease, unspecified: Secondary | ICD-10-CM | POA: Diagnosis not present

## 2018-05-19 DIAGNOSIS — B370 Candidal stomatitis: Secondary | ICD-10-CM | POA: Diagnosis not present

## 2018-05-19 DIAGNOSIS — S3210XD Unspecified fracture of sacrum, subsequent encounter for fracture with routine healing: Secondary | ICD-10-CM | POA: Diagnosis not present

## 2018-05-19 DIAGNOSIS — Z9981 Dependence on supplemental oxygen: Secondary | ICD-10-CM | POA: Diagnosis not present

## 2018-05-20 DIAGNOSIS — R1310 Dysphagia, unspecified: Secondary | ICD-10-CM | POA: Diagnosis not present

## 2018-05-20 DIAGNOSIS — J449 Chronic obstructive pulmonary disease, unspecified: Secondary | ICD-10-CM | POA: Diagnosis not present

## 2018-05-20 DIAGNOSIS — Z7952 Long term (current) use of systemic steroids: Secondary | ICD-10-CM | POA: Diagnosis not present

## 2018-05-20 DIAGNOSIS — Z9981 Dependence on supplemental oxygen: Secondary | ICD-10-CM | POA: Diagnosis not present

## 2018-05-20 DIAGNOSIS — S3210XD Unspecified fracture of sacrum, subsequent encounter for fracture with routine healing: Secondary | ICD-10-CM | POA: Diagnosis not present

## 2018-05-20 DIAGNOSIS — B370 Candidal stomatitis: Secondary | ICD-10-CM | POA: Diagnosis not present

## 2018-05-23 DIAGNOSIS — R1310 Dysphagia, unspecified: Secondary | ICD-10-CM | POA: Diagnosis not present

## 2018-05-23 DIAGNOSIS — Z7952 Long term (current) use of systemic steroids: Secondary | ICD-10-CM | POA: Diagnosis not present

## 2018-05-23 DIAGNOSIS — S3210XD Unspecified fracture of sacrum, subsequent encounter for fracture with routine healing: Secondary | ICD-10-CM | POA: Diagnosis not present

## 2018-05-23 DIAGNOSIS — Z9981 Dependence on supplemental oxygen: Secondary | ICD-10-CM | POA: Diagnosis not present

## 2018-05-23 DIAGNOSIS — B370 Candidal stomatitis: Secondary | ICD-10-CM | POA: Diagnosis not present

## 2018-05-23 DIAGNOSIS — J449 Chronic obstructive pulmonary disease, unspecified: Secondary | ICD-10-CM | POA: Diagnosis not present

## 2018-05-25 DIAGNOSIS — B370 Candidal stomatitis: Secondary | ICD-10-CM | POA: Diagnosis not present

## 2018-05-25 DIAGNOSIS — S3210XD Unspecified fracture of sacrum, subsequent encounter for fracture with routine healing: Secondary | ICD-10-CM | POA: Diagnosis not present

## 2018-05-25 DIAGNOSIS — Z7952 Long term (current) use of systemic steroids: Secondary | ICD-10-CM | POA: Diagnosis not present

## 2018-05-25 DIAGNOSIS — R1310 Dysphagia, unspecified: Secondary | ICD-10-CM | POA: Diagnosis not present

## 2018-05-25 DIAGNOSIS — J449 Chronic obstructive pulmonary disease, unspecified: Secondary | ICD-10-CM | POA: Diagnosis not present

## 2018-05-25 DIAGNOSIS — Z9981 Dependence on supplemental oxygen: Secondary | ICD-10-CM | POA: Diagnosis not present

## 2018-05-26 DIAGNOSIS — J449 Chronic obstructive pulmonary disease, unspecified: Secondary | ICD-10-CM | POA: Diagnosis not present

## 2018-05-26 DIAGNOSIS — Z9981 Dependence on supplemental oxygen: Secondary | ICD-10-CM | POA: Diagnosis not present

## 2018-05-26 DIAGNOSIS — B370 Candidal stomatitis: Secondary | ICD-10-CM | POA: Diagnosis not present

## 2018-05-26 DIAGNOSIS — Z7952 Long term (current) use of systemic steroids: Secondary | ICD-10-CM | POA: Diagnosis not present

## 2018-05-26 DIAGNOSIS — S3210XD Unspecified fracture of sacrum, subsequent encounter for fracture with routine healing: Secondary | ICD-10-CM | POA: Diagnosis not present

## 2018-05-26 DIAGNOSIS — R1310 Dysphagia, unspecified: Secondary | ICD-10-CM | POA: Diagnosis not present

## 2018-06-02 DIAGNOSIS — J449 Chronic obstructive pulmonary disease, unspecified: Secondary | ICD-10-CM | POA: Diagnosis not present

## 2018-06-02 DIAGNOSIS — S3210XD Unspecified fracture of sacrum, subsequent encounter for fracture with routine healing: Secondary | ICD-10-CM | POA: Diagnosis not present

## 2018-06-02 DIAGNOSIS — Z7952 Long term (current) use of systemic steroids: Secondary | ICD-10-CM | POA: Diagnosis not present

## 2018-06-02 DIAGNOSIS — R1310 Dysphagia, unspecified: Secondary | ICD-10-CM | POA: Diagnosis not present

## 2018-06-02 DIAGNOSIS — Z9981 Dependence on supplemental oxygen: Secondary | ICD-10-CM | POA: Diagnosis not present

## 2018-06-02 DIAGNOSIS — B370 Candidal stomatitis: Secondary | ICD-10-CM | POA: Diagnosis not present

## 2018-06-06 DIAGNOSIS — S3210XD Unspecified fracture of sacrum, subsequent encounter for fracture with routine healing: Secondary | ICD-10-CM | POA: Diagnosis not present

## 2018-06-06 DIAGNOSIS — R1310 Dysphagia, unspecified: Secondary | ICD-10-CM | POA: Diagnosis not present

## 2018-06-06 DIAGNOSIS — Z9981 Dependence on supplemental oxygen: Secondary | ICD-10-CM | POA: Diagnosis not present

## 2018-06-06 DIAGNOSIS — B370 Candidal stomatitis: Secondary | ICD-10-CM | POA: Diagnosis not present

## 2018-06-06 DIAGNOSIS — Z7952 Long term (current) use of systemic steroids: Secondary | ICD-10-CM | POA: Diagnosis not present

## 2018-06-06 DIAGNOSIS — J449 Chronic obstructive pulmonary disease, unspecified: Secondary | ICD-10-CM | POA: Diagnosis not present

## 2018-06-07 DIAGNOSIS — R1310 Dysphagia, unspecified: Secondary | ICD-10-CM | POA: Diagnosis not present

## 2018-06-07 DIAGNOSIS — E039 Hypothyroidism, unspecified: Secondary | ICD-10-CM | POA: Diagnosis not present

## 2018-06-07 DIAGNOSIS — J9611 Chronic respiratory failure with hypoxia: Secondary | ICD-10-CM | POA: Diagnosis not present

## 2018-06-07 DIAGNOSIS — Z7952 Long term (current) use of systemic steroids: Secondary | ICD-10-CM | POA: Diagnosis not present

## 2018-06-07 DIAGNOSIS — J449 Chronic obstructive pulmonary disease, unspecified: Secondary | ICD-10-CM | POA: Diagnosis not present

## 2018-06-07 DIAGNOSIS — Z9981 Dependence on supplemental oxygen: Secondary | ICD-10-CM | POA: Diagnosis not present

## 2018-06-07 DIAGNOSIS — B370 Candidal stomatitis: Secondary | ICD-10-CM | POA: Diagnosis not present

## 2018-06-07 DIAGNOSIS — S3210XD Unspecified fracture of sacrum, subsequent encounter for fracture with routine healing: Secondary | ICD-10-CM | POA: Diagnosis not present

## 2018-06-07 DIAGNOSIS — I5032 Chronic diastolic (congestive) heart failure: Secondary | ICD-10-CM | POA: Diagnosis not present

## 2018-06-07 DIAGNOSIS — I110 Hypertensive heart disease with heart failure: Secondary | ICD-10-CM | POA: Diagnosis not present

## 2018-06-07 DIAGNOSIS — F329 Major depressive disorder, single episode, unspecified: Secondary | ICD-10-CM | POA: Diagnosis not present

## 2018-06-08 DIAGNOSIS — Z9981 Dependence on supplemental oxygen: Secondary | ICD-10-CM | POA: Diagnosis not present

## 2018-06-08 DIAGNOSIS — B370 Candidal stomatitis: Secondary | ICD-10-CM | POA: Diagnosis not present

## 2018-06-08 DIAGNOSIS — S3210XD Unspecified fracture of sacrum, subsequent encounter for fracture with routine healing: Secondary | ICD-10-CM | POA: Diagnosis not present

## 2018-06-08 DIAGNOSIS — Z7952 Long term (current) use of systemic steroids: Secondary | ICD-10-CM | POA: Diagnosis not present

## 2018-06-08 DIAGNOSIS — J449 Chronic obstructive pulmonary disease, unspecified: Secondary | ICD-10-CM | POA: Diagnosis not present

## 2018-06-08 DIAGNOSIS — R1310 Dysphagia, unspecified: Secondary | ICD-10-CM | POA: Diagnosis not present

## 2018-06-09 DIAGNOSIS — S3210XD Unspecified fracture of sacrum, subsequent encounter for fracture with routine healing: Secondary | ICD-10-CM | POA: Diagnosis not present

## 2018-06-09 DIAGNOSIS — Z7952 Long term (current) use of systemic steroids: Secondary | ICD-10-CM | POA: Diagnosis not present

## 2018-06-09 DIAGNOSIS — B370 Candidal stomatitis: Secondary | ICD-10-CM | POA: Diagnosis not present

## 2018-06-09 DIAGNOSIS — R1310 Dysphagia, unspecified: Secondary | ICD-10-CM | POA: Diagnosis not present

## 2018-06-09 DIAGNOSIS — J449 Chronic obstructive pulmonary disease, unspecified: Secondary | ICD-10-CM | POA: Diagnosis not present

## 2018-06-09 DIAGNOSIS — Z9981 Dependence on supplemental oxygen: Secondary | ICD-10-CM | POA: Diagnosis not present

## 2018-06-10 DIAGNOSIS — J449 Chronic obstructive pulmonary disease, unspecified: Secondary | ICD-10-CM | POA: Diagnosis not present

## 2018-06-10 DIAGNOSIS — B370 Candidal stomatitis: Secondary | ICD-10-CM | POA: Diagnosis not present

## 2018-06-10 DIAGNOSIS — S3210XD Unspecified fracture of sacrum, subsequent encounter for fracture with routine healing: Secondary | ICD-10-CM | POA: Diagnosis not present

## 2018-06-10 DIAGNOSIS — Z9981 Dependence on supplemental oxygen: Secondary | ICD-10-CM | POA: Diagnosis not present

## 2018-06-10 DIAGNOSIS — R1310 Dysphagia, unspecified: Secondary | ICD-10-CM | POA: Diagnosis not present

## 2018-06-10 DIAGNOSIS — Z7952 Long term (current) use of systemic steroids: Secondary | ICD-10-CM | POA: Diagnosis not present

## 2018-06-13 DIAGNOSIS — R1310 Dysphagia, unspecified: Secondary | ICD-10-CM | POA: Diagnosis not present

## 2018-06-13 DIAGNOSIS — Z7952 Long term (current) use of systemic steroids: Secondary | ICD-10-CM | POA: Diagnosis not present

## 2018-06-13 DIAGNOSIS — Z9981 Dependence on supplemental oxygen: Secondary | ICD-10-CM | POA: Diagnosis not present

## 2018-06-13 DIAGNOSIS — J449 Chronic obstructive pulmonary disease, unspecified: Secondary | ICD-10-CM | POA: Diagnosis not present

## 2018-06-13 DIAGNOSIS — B370 Candidal stomatitis: Secondary | ICD-10-CM | POA: Diagnosis not present

## 2018-06-13 DIAGNOSIS — S3210XD Unspecified fracture of sacrum, subsequent encounter for fracture with routine healing: Secondary | ICD-10-CM | POA: Diagnosis not present

## 2018-06-14 DIAGNOSIS — B370 Candidal stomatitis: Secondary | ICD-10-CM | POA: Diagnosis not present

## 2018-06-14 DIAGNOSIS — R1310 Dysphagia, unspecified: Secondary | ICD-10-CM | POA: Diagnosis not present

## 2018-06-14 DIAGNOSIS — Z9981 Dependence on supplemental oxygen: Secondary | ICD-10-CM | POA: Diagnosis not present

## 2018-06-14 DIAGNOSIS — S3210XD Unspecified fracture of sacrum, subsequent encounter for fracture with routine healing: Secondary | ICD-10-CM | POA: Diagnosis not present

## 2018-06-14 DIAGNOSIS — J449 Chronic obstructive pulmonary disease, unspecified: Secondary | ICD-10-CM | POA: Diagnosis not present

## 2018-06-14 DIAGNOSIS — Z7952 Long term (current) use of systemic steroids: Secondary | ICD-10-CM | POA: Diagnosis not present

## 2018-06-16 DIAGNOSIS — Z9981 Dependence on supplemental oxygen: Secondary | ICD-10-CM | POA: Diagnosis not present

## 2018-06-16 DIAGNOSIS — S3210XD Unspecified fracture of sacrum, subsequent encounter for fracture with routine healing: Secondary | ICD-10-CM | POA: Diagnosis not present

## 2018-06-16 DIAGNOSIS — J449 Chronic obstructive pulmonary disease, unspecified: Secondary | ICD-10-CM | POA: Diagnosis not present

## 2018-06-16 DIAGNOSIS — B370 Candidal stomatitis: Secondary | ICD-10-CM | POA: Diagnosis not present

## 2018-06-16 DIAGNOSIS — Z7952 Long term (current) use of systemic steroids: Secondary | ICD-10-CM | POA: Diagnosis not present

## 2018-06-16 DIAGNOSIS — R1310 Dysphagia, unspecified: Secondary | ICD-10-CM | POA: Diagnosis not present

## 2018-06-20 DIAGNOSIS — R1310 Dysphagia, unspecified: Secondary | ICD-10-CM | POA: Diagnosis not present

## 2018-06-20 DIAGNOSIS — J449 Chronic obstructive pulmonary disease, unspecified: Secondary | ICD-10-CM | POA: Diagnosis not present

## 2018-06-20 DIAGNOSIS — Z9981 Dependence on supplemental oxygen: Secondary | ICD-10-CM | POA: Diagnosis not present

## 2018-06-20 DIAGNOSIS — Z7952 Long term (current) use of systemic steroids: Secondary | ICD-10-CM | POA: Diagnosis not present

## 2018-06-20 DIAGNOSIS — S3210XD Unspecified fracture of sacrum, subsequent encounter for fracture with routine healing: Secondary | ICD-10-CM | POA: Diagnosis not present

## 2018-06-20 DIAGNOSIS — B370 Candidal stomatitis: Secondary | ICD-10-CM | POA: Diagnosis not present

## 2018-06-23 DIAGNOSIS — B370 Candidal stomatitis: Secondary | ICD-10-CM | POA: Diagnosis not present

## 2018-06-23 DIAGNOSIS — R1310 Dysphagia, unspecified: Secondary | ICD-10-CM | POA: Diagnosis not present

## 2018-06-23 DIAGNOSIS — Z7952 Long term (current) use of systemic steroids: Secondary | ICD-10-CM | POA: Diagnosis not present

## 2018-06-23 DIAGNOSIS — J449 Chronic obstructive pulmonary disease, unspecified: Secondary | ICD-10-CM | POA: Diagnosis not present

## 2018-06-23 DIAGNOSIS — S3210XD Unspecified fracture of sacrum, subsequent encounter for fracture with routine healing: Secondary | ICD-10-CM | POA: Diagnosis not present

## 2018-06-23 DIAGNOSIS — Z9981 Dependence on supplemental oxygen: Secondary | ICD-10-CM | POA: Diagnosis not present

## 2018-06-27 DIAGNOSIS — R1310 Dysphagia, unspecified: Secondary | ICD-10-CM | POA: Diagnosis not present

## 2018-06-27 DIAGNOSIS — J449 Chronic obstructive pulmonary disease, unspecified: Secondary | ICD-10-CM | POA: Diagnosis not present

## 2018-06-27 DIAGNOSIS — Z7952 Long term (current) use of systemic steroids: Secondary | ICD-10-CM | POA: Diagnosis not present

## 2018-06-27 DIAGNOSIS — Z9981 Dependence on supplemental oxygen: Secondary | ICD-10-CM | POA: Diagnosis not present

## 2018-06-27 DIAGNOSIS — B370 Candidal stomatitis: Secondary | ICD-10-CM | POA: Diagnosis not present

## 2018-06-27 DIAGNOSIS — S3210XD Unspecified fracture of sacrum, subsequent encounter for fracture with routine healing: Secondary | ICD-10-CM | POA: Diagnosis not present

## 2018-06-28 DIAGNOSIS — Z23 Encounter for immunization: Secondary | ICD-10-CM | POA: Diagnosis not present

## 2018-07-01 DIAGNOSIS — R1310 Dysphagia, unspecified: Secondary | ICD-10-CM | POA: Diagnosis not present

## 2018-07-01 DIAGNOSIS — J449 Chronic obstructive pulmonary disease, unspecified: Secondary | ICD-10-CM | POA: Diagnosis not present

## 2018-07-01 DIAGNOSIS — Z7952 Long term (current) use of systemic steroids: Secondary | ICD-10-CM | POA: Diagnosis not present

## 2018-07-01 DIAGNOSIS — B370 Candidal stomatitis: Secondary | ICD-10-CM | POA: Diagnosis not present

## 2018-07-01 DIAGNOSIS — S3210XD Unspecified fracture of sacrum, subsequent encounter for fracture with routine healing: Secondary | ICD-10-CM | POA: Diagnosis not present

## 2018-07-01 DIAGNOSIS — Z9981 Dependence on supplemental oxygen: Secondary | ICD-10-CM | POA: Diagnosis not present

## 2018-07-04 DIAGNOSIS — R1310 Dysphagia, unspecified: Secondary | ICD-10-CM | POA: Diagnosis not present

## 2018-07-04 DIAGNOSIS — B370 Candidal stomatitis: Secondary | ICD-10-CM | POA: Diagnosis not present

## 2018-07-04 DIAGNOSIS — Z9981 Dependence on supplemental oxygen: Secondary | ICD-10-CM | POA: Diagnosis not present

## 2018-07-04 DIAGNOSIS — J449 Chronic obstructive pulmonary disease, unspecified: Secondary | ICD-10-CM | POA: Diagnosis not present

## 2018-07-04 DIAGNOSIS — Z7952 Long term (current) use of systemic steroids: Secondary | ICD-10-CM | POA: Diagnosis not present

## 2018-07-04 DIAGNOSIS — S3210XD Unspecified fracture of sacrum, subsequent encounter for fracture with routine healing: Secondary | ICD-10-CM | POA: Diagnosis not present

## 2018-07-06 DIAGNOSIS — B370 Candidal stomatitis: Secondary | ICD-10-CM | POA: Diagnosis not present

## 2018-07-06 DIAGNOSIS — J449 Chronic obstructive pulmonary disease, unspecified: Secondary | ICD-10-CM | POA: Diagnosis not present

## 2018-07-06 DIAGNOSIS — Z9981 Dependence on supplemental oxygen: Secondary | ICD-10-CM | POA: Diagnosis not present

## 2018-07-06 DIAGNOSIS — Z7952 Long term (current) use of systemic steroids: Secondary | ICD-10-CM | POA: Diagnosis not present

## 2018-07-06 DIAGNOSIS — R1310 Dysphagia, unspecified: Secondary | ICD-10-CM | POA: Diagnosis not present

## 2018-07-06 DIAGNOSIS — S3210XD Unspecified fracture of sacrum, subsequent encounter for fracture with routine healing: Secondary | ICD-10-CM | POA: Diagnosis not present

## 2018-07-08 DIAGNOSIS — F329 Major depressive disorder, single episode, unspecified: Secondary | ICD-10-CM | POA: Diagnosis not present

## 2018-07-08 DIAGNOSIS — E039 Hypothyroidism, unspecified: Secondary | ICD-10-CM | POA: Diagnosis not present

## 2018-07-08 DIAGNOSIS — S3210XD Unspecified fracture of sacrum, subsequent encounter for fracture with routine healing: Secondary | ICD-10-CM | POA: Diagnosis not present

## 2018-07-08 DIAGNOSIS — J449 Chronic obstructive pulmonary disease, unspecified: Secondary | ICD-10-CM | POA: Diagnosis not present

## 2018-07-08 DIAGNOSIS — I110 Hypertensive heart disease with heart failure: Secondary | ICD-10-CM | POA: Diagnosis not present

## 2018-07-08 DIAGNOSIS — R1310 Dysphagia, unspecified: Secondary | ICD-10-CM | POA: Diagnosis not present

## 2018-07-08 DIAGNOSIS — Z7952 Long term (current) use of systemic steroids: Secondary | ICD-10-CM | POA: Diagnosis not present

## 2018-07-08 DIAGNOSIS — J9611 Chronic respiratory failure with hypoxia: Secondary | ICD-10-CM | POA: Diagnosis not present

## 2018-07-08 DIAGNOSIS — I5032 Chronic diastolic (congestive) heart failure: Secondary | ICD-10-CM | POA: Diagnosis not present

## 2018-07-08 DIAGNOSIS — B370 Candidal stomatitis: Secondary | ICD-10-CM | POA: Diagnosis not present

## 2018-07-08 DIAGNOSIS — Z9981 Dependence on supplemental oxygen: Secondary | ICD-10-CM | POA: Diagnosis not present

## 2018-07-11 DIAGNOSIS — Z9981 Dependence on supplemental oxygen: Secondary | ICD-10-CM | POA: Diagnosis not present

## 2018-07-11 DIAGNOSIS — R1310 Dysphagia, unspecified: Secondary | ICD-10-CM | POA: Diagnosis not present

## 2018-07-11 DIAGNOSIS — Z7952 Long term (current) use of systemic steroids: Secondary | ICD-10-CM | POA: Diagnosis not present

## 2018-07-11 DIAGNOSIS — J449 Chronic obstructive pulmonary disease, unspecified: Secondary | ICD-10-CM | POA: Diagnosis not present

## 2018-07-11 DIAGNOSIS — B370 Candidal stomatitis: Secondary | ICD-10-CM | POA: Diagnosis not present

## 2018-07-11 DIAGNOSIS — S3210XD Unspecified fracture of sacrum, subsequent encounter for fracture with routine healing: Secondary | ICD-10-CM | POA: Diagnosis not present

## 2018-07-14 DIAGNOSIS — J449 Chronic obstructive pulmonary disease, unspecified: Secondary | ICD-10-CM | POA: Diagnosis not present

## 2018-07-14 DIAGNOSIS — R1310 Dysphagia, unspecified: Secondary | ICD-10-CM | POA: Diagnosis not present

## 2018-07-14 DIAGNOSIS — S3210XD Unspecified fracture of sacrum, subsequent encounter for fracture with routine healing: Secondary | ICD-10-CM | POA: Diagnosis not present

## 2018-07-14 DIAGNOSIS — Z9981 Dependence on supplemental oxygen: Secondary | ICD-10-CM | POA: Diagnosis not present

## 2018-07-14 DIAGNOSIS — B370 Candidal stomatitis: Secondary | ICD-10-CM | POA: Diagnosis not present

## 2018-07-14 DIAGNOSIS — Z7952 Long term (current) use of systemic steroids: Secondary | ICD-10-CM | POA: Diagnosis not present

## 2018-07-18 DIAGNOSIS — B370 Candidal stomatitis: Secondary | ICD-10-CM | POA: Diagnosis not present

## 2018-07-18 DIAGNOSIS — S3210XD Unspecified fracture of sacrum, subsequent encounter for fracture with routine healing: Secondary | ICD-10-CM | POA: Diagnosis not present

## 2018-07-18 DIAGNOSIS — J449 Chronic obstructive pulmonary disease, unspecified: Secondary | ICD-10-CM | POA: Diagnosis not present

## 2018-07-18 DIAGNOSIS — R1310 Dysphagia, unspecified: Secondary | ICD-10-CM | POA: Diagnosis not present

## 2018-07-18 DIAGNOSIS — Z7952 Long term (current) use of systemic steroids: Secondary | ICD-10-CM | POA: Diagnosis not present

## 2018-07-18 DIAGNOSIS — Z9981 Dependence on supplemental oxygen: Secondary | ICD-10-CM | POA: Diagnosis not present

## 2018-07-21 DIAGNOSIS — B370 Candidal stomatitis: Secondary | ICD-10-CM | POA: Diagnosis not present

## 2018-07-21 DIAGNOSIS — R1310 Dysphagia, unspecified: Secondary | ICD-10-CM | POA: Diagnosis not present

## 2018-07-21 DIAGNOSIS — Z9981 Dependence on supplemental oxygen: Secondary | ICD-10-CM | POA: Diagnosis not present

## 2018-07-21 DIAGNOSIS — S3210XD Unspecified fracture of sacrum, subsequent encounter for fracture with routine healing: Secondary | ICD-10-CM | POA: Diagnosis not present

## 2018-07-21 DIAGNOSIS — Z7952 Long term (current) use of systemic steroids: Secondary | ICD-10-CM | POA: Diagnosis not present

## 2018-07-21 DIAGNOSIS — J449 Chronic obstructive pulmonary disease, unspecified: Secondary | ICD-10-CM | POA: Diagnosis not present

## 2018-07-22 DIAGNOSIS — Z7952 Long term (current) use of systemic steroids: Secondary | ICD-10-CM | POA: Diagnosis not present

## 2018-07-22 DIAGNOSIS — S3210XD Unspecified fracture of sacrum, subsequent encounter for fracture with routine healing: Secondary | ICD-10-CM | POA: Diagnosis not present

## 2018-07-22 DIAGNOSIS — B370 Candidal stomatitis: Secondary | ICD-10-CM | POA: Diagnosis not present

## 2018-07-22 DIAGNOSIS — Z9981 Dependence on supplemental oxygen: Secondary | ICD-10-CM | POA: Diagnosis not present

## 2018-07-22 DIAGNOSIS — R1310 Dysphagia, unspecified: Secondary | ICD-10-CM | POA: Diagnosis not present

## 2018-07-22 DIAGNOSIS — J449 Chronic obstructive pulmonary disease, unspecified: Secondary | ICD-10-CM | POA: Diagnosis not present

## 2018-07-25 DIAGNOSIS — R1310 Dysphagia, unspecified: Secondary | ICD-10-CM | POA: Diagnosis not present

## 2018-07-25 DIAGNOSIS — S3210XD Unspecified fracture of sacrum, subsequent encounter for fracture with routine healing: Secondary | ICD-10-CM | POA: Diagnosis not present

## 2018-07-25 DIAGNOSIS — J449 Chronic obstructive pulmonary disease, unspecified: Secondary | ICD-10-CM | POA: Diagnosis not present

## 2018-07-25 DIAGNOSIS — B370 Candidal stomatitis: Secondary | ICD-10-CM | POA: Diagnosis not present

## 2018-07-25 DIAGNOSIS — Z9981 Dependence on supplemental oxygen: Secondary | ICD-10-CM | POA: Diagnosis not present

## 2018-07-25 DIAGNOSIS — Z7952 Long term (current) use of systemic steroids: Secondary | ICD-10-CM | POA: Diagnosis not present

## 2018-07-26 ENCOUNTER — Ambulatory Visit (HOSPITAL_COMMUNITY): Admission: RE | Admit: 2018-07-26 | Payer: 59 | Source: Ambulatory Visit

## 2018-07-28 DIAGNOSIS — Z9981 Dependence on supplemental oxygen: Secondary | ICD-10-CM | POA: Diagnosis not present

## 2018-07-28 DIAGNOSIS — J449 Chronic obstructive pulmonary disease, unspecified: Secondary | ICD-10-CM | POA: Diagnosis not present

## 2018-07-28 DIAGNOSIS — R1310 Dysphagia, unspecified: Secondary | ICD-10-CM | POA: Diagnosis not present

## 2018-07-28 DIAGNOSIS — Z7952 Long term (current) use of systemic steroids: Secondary | ICD-10-CM | POA: Diagnosis not present

## 2018-07-28 DIAGNOSIS — S3210XD Unspecified fracture of sacrum, subsequent encounter for fracture with routine healing: Secondary | ICD-10-CM | POA: Diagnosis not present

## 2018-07-28 DIAGNOSIS — B370 Candidal stomatitis: Secondary | ICD-10-CM | POA: Diagnosis not present

## 2018-08-01 ENCOUNTER — Emergency Department (HOSPITAL_COMMUNITY): Payer: Medicare Other

## 2018-08-01 ENCOUNTER — Other Ambulatory Visit: Payer: Self-pay

## 2018-08-01 ENCOUNTER — Inpatient Hospital Stay (HOSPITAL_COMMUNITY)
Admission: EM | Admit: 2018-08-01 | Discharge: 2018-08-08 | DRG: 481 | Disposition: A | Discharge status: Skilled Nursing Facility | Payer: Medicare Other | Attending: Family Medicine | Admitting: Family Medicine

## 2018-08-01 ENCOUNTER — Encounter (HOSPITAL_COMMUNITY): Payer: Self-pay | Admitting: Emergency Medicine

## 2018-08-01 DIAGNOSIS — Z9841 Cataract extraction status, right eye: Secondary | ICD-10-CM

## 2018-08-01 DIAGNOSIS — Z681 Body mass index (BMI) 19 or less, adult: Secondary | ICD-10-CM

## 2018-08-01 DIAGNOSIS — S72142A Displaced intertrochanteric fracture of left femur, initial encounter for closed fracture: Principal | ICD-10-CM | POA: Diagnosis present

## 2018-08-01 DIAGNOSIS — Z7952 Long term (current) use of systemic steroids: Secondary | ICD-10-CM | POA: Diagnosis not present

## 2018-08-01 DIAGNOSIS — I10 Essential (primary) hypertension: Secondary | ICD-10-CM | POA: Diagnosis not present

## 2018-08-01 DIAGNOSIS — Z90710 Acquired absence of both cervix and uterus: Secondary | ICD-10-CM

## 2018-08-01 DIAGNOSIS — F329 Major depressive disorder, single episode, unspecified: Secondary | ICD-10-CM | POA: Diagnosis present

## 2018-08-01 DIAGNOSIS — F05 Delirium due to known physiological condition: Secondary | ICD-10-CM | POA: Diagnosis present

## 2018-08-01 DIAGNOSIS — F339 Major depressive disorder, recurrent, unspecified: Secondary | ICD-10-CM | POA: Diagnosis not present

## 2018-08-01 DIAGNOSIS — D6489 Other specified anemias: Secondary | ICD-10-CM | POA: Diagnosis not present

## 2018-08-01 DIAGNOSIS — E440 Moderate protein-calorie malnutrition: Secondary | ICD-10-CM | POA: Diagnosis present

## 2018-08-01 DIAGNOSIS — K5900 Constipation, unspecified: Secondary | ICD-10-CM | POA: Diagnosis present

## 2018-08-01 DIAGNOSIS — S299XXA Unspecified injury of thorax, initial encounter: Secondary | ICD-10-CM | POA: Diagnosis not present

## 2018-08-01 DIAGNOSIS — W010XXA Fall on same level from slipping, tripping and stumbling without subsequent striking against object, initial encounter: Secondary | ICD-10-CM | POA: Diagnosis not present

## 2018-08-01 DIAGNOSIS — D696 Thrombocytopenia, unspecified: Secondary | ICD-10-CM | POA: Diagnosis not present

## 2018-08-01 DIAGNOSIS — E8889 Other specified metabolic disorders: Secondary | ICD-10-CM | POA: Diagnosis present

## 2018-08-01 DIAGNOSIS — Z79899 Other long term (current) drug therapy: Secondary | ICD-10-CM

## 2018-08-01 DIAGNOSIS — Y92019 Unspecified place in single-family (private) house as the place of occurrence of the external cause: Secondary | ICD-10-CM

## 2018-08-01 DIAGNOSIS — M2550 Pain in unspecified joint: Secondary | ICD-10-CM | POA: Diagnosis not present

## 2018-08-01 DIAGNOSIS — R000 Tachycardia, unspecified: Secondary | ICD-10-CM | POA: Diagnosis present

## 2018-08-01 DIAGNOSIS — Z9981 Dependence on supplemental oxygen: Secondary | ICD-10-CM | POA: Diagnosis not present

## 2018-08-01 DIAGNOSIS — Z7951 Long term (current) use of inhaled steroids: Secondary | ICD-10-CM

## 2018-08-01 DIAGNOSIS — E876 Hypokalemia: Secondary | ICD-10-CM | POA: Diagnosis present

## 2018-08-01 DIAGNOSIS — Z87891 Personal history of nicotine dependence: Secondary | ICD-10-CM

## 2018-08-01 DIAGNOSIS — I5022 Chronic systolic (congestive) heart failure: Secondary | ICD-10-CM | POA: Diagnosis not present

## 2018-08-01 DIAGNOSIS — J449 Chronic obstructive pulmonary disease, unspecified: Secondary | ICD-10-CM | POA: Diagnosis present

## 2018-08-01 DIAGNOSIS — Z7401 Bed confinement status: Secondary | ICD-10-CM | POA: Diagnosis not present

## 2018-08-01 DIAGNOSIS — S72001A Fracture of unspecified part of neck of right femur, initial encounter for closed fracture: Secondary | ICD-10-CM | POA: Diagnosis present

## 2018-08-01 DIAGNOSIS — M810 Age-related osteoporosis without current pathological fracture: Secondary | ICD-10-CM | POA: Diagnosis present

## 2018-08-01 DIAGNOSIS — Z419 Encounter for procedure for purposes other than remedying health state, unspecified: Secondary | ICD-10-CM

## 2018-08-01 DIAGNOSIS — I110 Hypertensive heart disease with heart failure: Secondary | ICD-10-CM | POA: Diagnosis present

## 2018-08-01 DIAGNOSIS — I5032 Chronic diastolic (congestive) heart failure: Secondary | ICD-10-CM | POA: Diagnosis present

## 2018-08-01 DIAGNOSIS — Z803 Family history of malignant neoplasm of breast: Secondary | ICD-10-CM

## 2018-08-01 DIAGNOSIS — J9691 Respiratory failure, unspecified with hypoxia: Secondary | ICD-10-CM | POA: Diagnosis not present

## 2018-08-01 DIAGNOSIS — S72002A Fracture of unspecified part of neck of left femur, initial encounter for closed fracture: Secondary | ICD-10-CM

## 2018-08-01 DIAGNOSIS — M25562 Pain in left knee: Secondary | ICD-10-CM | POA: Diagnosis not present

## 2018-08-01 DIAGNOSIS — E785 Hyperlipidemia, unspecified: Secondary | ICD-10-CM | POA: Diagnosis present

## 2018-08-01 DIAGNOSIS — D62 Acute posthemorrhagic anemia: Secondary | ICD-10-CM

## 2018-08-01 DIAGNOSIS — J9611 Chronic respiratory failure with hypoxia: Secondary | ICD-10-CM | POA: Diagnosis present

## 2018-08-01 DIAGNOSIS — Z8261 Family history of arthritis: Secondary | ICD-10-CM

## 2018-08-01 DIAGNOSIS — R531 Weakness: Secondary | ICD-10-CM | POA: Diagnosis present

## 2018-08-01 DIAGNOSIS — B370 Candidal stomatitis: Secondary | ICD-10-CM | POA: Diagnosis not present

## 2018-08-01 DIAGNOSIS — R0689 Other abnormalities of breathing: Secondary | ICD-10-CM | POA: Diagnosis not present

## 2018-08-01 DIAGNOSIS — F419 Anxiety disorder, unspecified: Secondary | ICD-10-CM | POA: Diagnosis present

## 2018-08-01 DIAGNOSIS — S0990XA Unspecified injury of head, initial encounter: Secondary | ICD-10-CM | POA: Diagnosis not present

## 2018-08-01 DIAGNOSIS — E039 Hypothyroidism, unspecified: Secondary | ICD-10-CM | POA: Diagnosis present

## 2018-08-01 DIAGNOSIS — Z91030 Bee allergy status: Secondary | ICD-10-CM

## 2018-08-01 DIAGNOSIS — Y939 Activity, unspecified: Secondary | ICD-10-CM | POA: Diagnosis not present

## 2018-08-01 DIAGNOSIS — Z9842 Cataract extraction status, left eye: Secondary | ICD-10-CM

## 2018-08-01 DIAGNOSIS — Z823 Family history of stroke: Secondary | ICD-10-CM

## 2018-08-01 DIAGNOSIS — F0390 Unspecified dementia without behavioral disturbance: Secondary | ICD-10-CM | POA: Diagnosis present

## 2018-08-01 DIAGNOSIS — Z4789 Encounter for other orthopedic aftercare: Secondary | ICD-10-CM | POA: Diagnosis not present

## 2018-08-01 DIAGNOSIS — Z961 Presence of intraocular lens: Secondary | ICD-10-CM | POA: Diagnosis present

## 2018-08-01 DIAGNOSIS — Z8249 Family history of ischemic heart disease and other diseases of the circulatory system: Secondary | ICD-10-CM

## 2018-08-01 DIAGNOSIS — R296 Repeated falls: Secondary | ICD-10-CM | POA: Diagnosis not present

## 2018-08-01 DIAGNOSIS — F0391 Unspecified dementia with behavioral disturbance: Secondary | ICD-10-CM | POA: Diagnosis not present

## 2018-08-01 DIAGNOSIS — I5189 Other ill-defined heart diseases: Secondary | ICD-10-CM

## 2018-08-01 DIAGNOSIS — R1310 Dysphagia, unspecified: Secondary | ICD-10-CM | POA: Diagnosis not present

## 2018-08-01 DIAGNOSIS — Z79890 Hormone replacement therapy (postmenopausal): Secondary | ICD-10-CM

## 2018-08-01 DIAGNOSIS — Z8701 Personal history of pneumonia (recurrent): Secondary | ICD-10-CM

## 2018-08-01 DIAGNOSIS — S72142D Displaced intertrochanteric fracture of left femur, subsequent encounter for closed fracture with routine healing: Secondary | ICD-10-CM | POA: Diagnosis not present

## 2018-08-01 DIAGNOSIS — S8992XA Unspecified injury of left lower leg, initial encounter: Secondary | ICD-10-CM | POA: Diagnosis not present

## 2018-08-01 DIAGNOSIS — S3210XD Unspecified fracture of sacrum, subsequent encounter for fracture with routine healing: Secondary | ICD-10-CM | POA: Diagnosis not present

## 2018-08-01 DIAGNOSIS — W19XXXA Unspecified fall, initial encounter: Secondary | ICD-10-CM | POA: Diagnosis not present

## 2018-08-01 DIAGNOSIS — T148XXA Other injury of unspecified body region, initial encounter: Secondary | ICD-10-CM

## 2018-08-01 DIAGNOSIS — E038 Other specified hypothyroidism: Secondary | ICD-10-CM | POA: Diagnosis not present

## 2018-08-01 DIAGNOSIS — M6281 Muscle weakness (generalized): Secondary | ICD-10-CM | POA: Diagnosis not present

## 2018-08-01 DIAGNOSIS — Z96642 Presence of left artificial hip joint: Secondary | ICD-10-CM | POA: Diagnosis not present

## 2018-08-01 LAB — CBC WITH DIFFERENTIAL/PLATELET
Abs Immature Granulocytes: 0.05 10*3/uL (ref 0.00–0.07)
BASOS ABS: 0 10*3/uL (ref 0.0–0.1)
Basophils Relative: 0 %
EOS ABS: 0.2 10*3/uL (ref 0.0–0.5)
EOS PCT: 1 %
HEMATOCRIT: 34.9 % — AB (ref 36.0–46.0)
Hemoglobin: 11 g/dL — ABNORMAL LOW (ref 12.0–15.0)
IMMATURE GRANULOCYTES: 0 %
Lymphocytes Relative: 7 %
Lymphs Abs: 1 10*3/uL (ref 0.7–4.0)
MCH: 28.1 pg (ref 26.0–34.0)
MCHC: 31.5 g/dL (ref 30.0–36.0)
MCV: 89 fL (ref 80.0–100.0)
MONOS PCT: 8 %
Monocytes Absolute: 1.1 10*3/uL — ABNORMAL HIGH (ref 0.1–1.0)
NEUTROS PCT: 84 %
NRBC: 0 % (ref 0.0–0.2)
Neutro Abs: 12 10*3/uL — ABNORMAL HIGH (ref 1.7–7.7)
PLATELETS: 298 10*3/uL (ref 150–400)
RBC: 3.92 MIL/uL (ref 3.87–5.11)
RDW: 12.5 % (ref 11.5–15.5)
WBC: 14.3 10*3/uL — ABNORMAL HIGH (ref 4.0–10.5)

## 2018-08-01 LAB — URINALYSIS, ROUTINE W REFLEX MICROSCOPIC
Bilirubin Urine: NEGATIVE
GLUCOSE, UA: NEGATIVE mg/dL
Hgb urine dipstick: NEGATIVE
KETONES UR: NEGATIVE mg/dL
Nitrite: NEGATIVE
Protein, ur: NEGATIVE mg/dL
Specific Gravity, Urine: 1.009 (ref 1.005–1.030)
pH: 6 (ref 5.0–8.0)

## 2018-08-01 LAB — HEPATIC FUNCTION PANEL
ALBUMIN: 3.9 g/dL (ref 3.5–5.0)
ALT: 13 U/L (ref 0–44)
AST: 16 U/L (ref 15–41)
Alkaline Phosphatase: 77 U/L (ref 38–126)
Bilirubin, Direct: 0.1 mg/dL (ref 0.0–0.2)
Indirect Bilirubin: 0.7 mg/dL (ref 0.3–0.9)
Total Bilirubin: 0.8 mg/dL (ref 0.3–1.2)
Total Protein: 6.8 g/dL (ref 6.5–8.1)

## 2018-08-01 LAB — BASIC METABOLIC PANEL
ANION GAP: 12 (ref 5–15)
BUN: 16 mg/dL (ref 8–23)
CO2: 35 mmol/L — ABNORMAL HIGH (ref 22–32)
Calcium: 8.7 mg/dL — ABNORMAL LOW (ref 8.9–10.3)
Chloride: 91 mmol/L — ABNORMAL LOW (ref 98–111)
Creatinine, Ser: 1.15 mg/dL — ABNORMAL HIGH (ref 0.44–1.00)
GFR calc Af Amer: 52 mL/min — ABNORMAL LOW (ref >60)
GFR, EST NON AFRICAN AMERICAN: 45 mL/min — AB (ref >60)
Glucose, Bld: 158 mg/dL — ABNORMAL HIGH (ref 70–99)
POTASSIUM: 2.6 mmol/L — AB (ref 3.5–5.1)
SODIUM: 138 mmol/L (ref 135–145)

## 2018-08-01 LAB — PROTIME-INR
INR: 0.92
Prothrombin Time: 12.3 seconds (ref 11.4–15.2)

## 2018-08-01 LAB — MAGNESIUM: Magnesium: 1.9 mg/dL (ref 1.7–2.4)

## 2018-08-01 LAB — PHOSPHORUS: Phosphorus: 3.6 mg/dL (ref 2.5–4.6)

## 2018-08-01 LAB — TROPONIN I

## 2018-08-01 MED ORDER — ACETAMINOPHEN 325 MG PO TABS
650.0000 mg | ORAL_TABLET | Freq: Four times a day (QID) | ORAL | Status: DC | PRN
  Administered 2018-08-02 – 2018-08-08 (×8): 650 mg via ORAL
  Filled 2018-08-01 (×9): qty 2

## 2018-08-01 MED ORDER — ONDANSETRON HCL 4 MG PO TABS: 4.0000 mg | ORAL_TABLET | Freq: Four times a day (QID) | ORAL | Status: DC | PRN

## 2018-08-01 MED ORDER — HEPARIN SODIUM (PORCINE) 5000 UNIT/ML IJ SOLN
5000.0000 [IU] | Freq: Three times a day (TID) | INTRAMUSCULAR | Status: DC
  Administered 2018-08-02 (×3): 5000 [IU] via SUBCUTANEOUS
  Filled 2018-08-01 (×3): qty 1

## 2018-08-01 MED ORDER — SODIUM CHLORIDE 0.9 % IV SOLN
INTRAVENOUS | Status: DC
  Administered 2018-08-01: 21:00:00 via INTRAVENOUS

## 2018-08-01 MED ORDER — MAGNESIUM SULFATE 2 GM/50ML IV SOLN
2.0000 g | Freq: Once | INTRAVENOUS | Status: AC
  Administered 2018-08-02: 2 g via INTRAVENOUS
  Filled 2018-08-01: qty 50

## 2018-08-01 MED ORDER — SENNOSIDES-DOCUSATE SODIUM 8.6-50 MG PO TABS
1.0000 | ORAL_TABLET | Freq: Two times a day (BID) | ORAL | Status: DC
  Administered 2018-08-02 – 2018-08-08 (×11): 1 via ORAL
  Filled 2018-08-01 (×16): qty 1

## 2018-08-01 MED ORDER — KETOROLAC TROMETHAMINE 15 MG/ML IJ SOLN
15.0000 mg | Freq: Once | INTRAMUSCULAR | Status: AC
  Administered 2018-08-01: 15 mg via INTRAVENOUS
  Filled 2018-08-01: qty 1

## 2018-08-01 MED ORDER — ONDANSETRON HCL 4 MG/2ML IJ SOLN
4.0000 mg | INTRAMUSCULAR | Status: DC | PRN
  Administered 2018-08-01: 4 mg via INTRAVENOUS
  Filled 2018-08-01: qty 2

## 2018-08-01 MED ORDER — HEPARIN SODIUM (PORCINE) 5000 UNIT/ML IJ SOLN: 5000.0000 [IU] | Freq: Three times a day (TID) | INTRAMUSCULAR | Status: DC

## 2018-08-01 MED ORDER — OXYCODONE HCL 5 MG PO TABS
5.0000 mg | ORAL_TABLET | ORAL | Status: DC | PRN
  Administered 2018-08-02 – 2018-08-08 (×8): 5 mg via ORAL
  Filled 2018-08-01 (×8): qty 1

## 2018-08-01 MED ORDER — ACETAMINOPHEN 650 MG RE SUPP: 650.0000 mg | Freq: Four times a day (QID) | RECTAL | Status: DC | PRN

## 2018-08-01 MED ORDER — POTASSIUM CHLORIDE CRYS ER 20 MEQ PO TBCR
40.0000 meq | EXTENDED_RELEASE_TABLET | Freq: Once | ORAL | Status: AC
  Administered 2018-08-01: 40 meq via ORAL
  Filled 2018-08-01: qty 2

## 2018-08-01 MED ORDER — BISACODYL 10 MG RE SUPP: 10.0000 mg | Freq: Every day | RECTAL | Status: DC | PRN

## 2018-08-01 MED ORDER — MORPHINE SULFATE (PF) 2 MG/ML IV SOLN
2.0000 mg | INTRAVENOUS | Status: AC | PRN
  Administered 2018-08-01 – 2018-08-02 (×2): 2 mg via INTRAVENOUS
  Filled 2018-08-01 (×2): qty 1

## 2018-08-01 MED ORDER — IPRATROPIUM BROMIDE 0.02 % IN SOLN: 0.5000 mg | RESPIRATORY_TRACT | Status: DC | PRN

## 2018-08-01 MED ORDER — POTASSIUM CHLORIDE IN NACL 40-0.9 MEQ/L-% IV SOLN
INTRAVENOUS | Status: DC
  Administered 2018-08-02 (×2): 75 mL/h via INTRAVENOUS
  Filled 2018-08-01 (×5): qty 1000

## 2018-08-01 MED ORDER — ONDANSETRON HCL 4 MG/2ML IJ SOLN: 4.0000 mg | Freq: Four times a day (QID) | INTRAMUSCULAR | Status: DC | PRN

## 2018-08-01 MED ORDER — ALBUTEROL SULFATE (2.5 MG/3ML) 0.083% IN NEBU: 2.5000 mg | INHALATION_SOLUTION | RESPIRATORY_TRACT | Status: DC | PRN

## 2018-08-01 NOTE — ED Triage Notes (Addendum)
Patient brought in by EMS from home for fall with possible left hip fracture. Patient given fentanyl 50 mg IV en route to ER.

## 2018-08-01 NOTE — H&P (Signed)
History and Physical    [name] AYT:[id number] DOB: Mar 26, 1941 DOA: 08/01/2018  PCP: [name], MD   Patient coming from: Home.  I have personally briefly reviewed patient's old medical records in [place]  Chief Complaint: Fall.  HPI: [name] is a 77 y.o. female with medical history significant of CHF, COPD, diastolic dysfunction, dyspnea, tobacco abuse, hyperlipidemia, hypertension, hypothyroidism, depression, history of pneumonia who is coming to the emergency department with complaints of having a mechanical fall at home.  She denies dizziness, palpitations, chest pain, dyspnea, diaphoresis, blurred vision, headache or any other prodromal symptoms before falling.  She states that this was an accident.  She denies fever, chills, sore throat, wheezing, hemoptysis, PND, orthopnea or recent pitting edema of the lower extremities.  No abdominal pain, nausea, emesis, diarrhea, constipation, melena or hematochezia.  Denies dysuria, frequency or hematuria.  No polyuria, polydipsia, polyphagia or blurred vision.  Denies heat or cold intolerance.  Denies pruritus or skin rashes.  ED Course: Initial vital signs temperature 98.1 F, pulse 92, respirations 18, blood pressure 176/89 mmHg and O2 sat 97% on room air.  Analgesics, potassium and magnesium supplementation in the emergency department.  Orthopedic surgery on call was contacted and requested transfer to [facility] for further evaluation and treatment.  Her urinalysis shows a hazy appearance, with moderate leukocyte esterase, more than 50 WBCs per hpf and rare bacteria.  White counts 14.3, hemoglobin 11.0 g/dL and platelets 298.  PT and INR were normal.  Troponin was normal.  LFTs were within normal limits.  Sodium was 138, potassium 2.6, chloride 91 and CO2 35 mmol/L.  BUN 16, glucose 158, creatinine 1.15, calcium 9.7, magnesium 1.9 and phosphorus 3.6 mg/dL.  Imaging: Chest radiograph shows hyperinflation with emphysematous disease,  but no acute cardiopulmonary pathology.  CT head shows moderate brain parenchymal atrophy and chronic microvascular disease.  However there is no acute intracranial normality.  Left hip x-ray shows an acute mildly comminuted and displaced left intertrochanteric fracture.  Please see images and full radiology report for further detail.  Review of Systems: As per HPI otherwise 10 point review of systems negative.   Past Medical History:  Diagnosis Date  . CHF (congestive heart failure) ([place])   . COPD (chronic obstructive pulmonary disease) ([place])    home O2  . Diastolic dysfunction   . Dyspnea   . History of nuclear stress test 09/23/2009   dipyridamole; normal pattern of perfusion; low risk scan   . History of tobacco abuse   . Hyperlipidemia   . Hypertension   . Hypothyroidism   . Mild depression ([place])   . Pneumonia     Past Surgical History:  Procedure Laterality Date  . BIOPSY  01/13/2017   Procedure: BIOPSY;  Surgeon: Daneil Dolin, MD;  Location: [facility];  Service: Endoscopy;;  right/left/sigmoid colon  . CATARACT EXTRACTION W/PHACO  09/08/2012   Procedure: CATARACT EXTRACTION PHACO AND INTRAOCULAR LENS PLACEMENT (IOC);  Surgeon: Tonny Branch, MD;  Location: [facility] ORS;  Service: Ophthalmology;  Laterality: Right;  CDE:19.67  . CATARACT EXTRACTION W/PHACO  09/19/2012   Procedure: CATARACT EXTRACTION PHACO AND INTRAOCULAR LENS PLACEMENT (IOC);  Surgeon: Tonny Branch, MD;  Location: [facility] ORS;  Service: Ophthalmology;  Laterality: Left;  CDE:19.83  . COLONOSCOPY  2007   Dr. Gala Romney: internal hemorrhoids, benign polyps  . COLONOSCOPY N/A 01/13/2017   Procedure: COLONOSCOPY;  Surgeon: Daneil Dolin, MD;  Location: [facility];  Service: Endoscopy;  Laterality:  N/A;  12:15pm  . ESOPHAGOGASTRODUODENOSCOPY  2007   Dr. Gala Romney: normal esophagus, non-critical Schatzki's ring not manipulated, normal small bowel biopsy  . ESOPHAGOGASTRODUODENOSCOPY (EGD) WITH ESOPHAGEAL DILATION N/A 10/16/2013   Dr.  Fields:Schatzki's ring at the gastroesophageal junction s/p dilation/MODERATE Erosive gastritis. reactive gastropathy.   Marland Kitchen [place]  . right carpal tunnel release    . TONSILLECTOMY    . TOTAL ABDOMINAL HYSTERECTOMY  1980's  . TRANSTHORACIC ECHOCARDIOGRAM  09/23/2009   EF>55%; trace TR; normal LA & RA size; normal LV & RV size & systolic function      reports that she quit smoking about 9 years ago. Her smoking use included cigarettes. She has a 50.00 pack-year smoking history. She has never used smokeless tobacco. She reports that she does not drink alcohol or use drugs.  Allergies  Allergen Reactions  . Bee Venom Swelling    Family History  Problem Relation Age of Onset  . Allergies Father   . Heart disease Mother   . Rheum arthritis Mother   . Hypertension Mother   . Stroke Maternal Grandmother   . Breast cancer Daughter   . Breast cancer Sister   . Cancer Maternal Aunt   . Stroke Sister   . Colon cancer Neg Hx    Prior to Admission medications   Medication Sig Start Date End Date Taking? Authorizing Provider  acetaminophen (TYLENOL) 325 MG tablet Take 2 tablets (650 mg total) by mouth every 6 (six) hours as needed for mild pain (or Fever >/= 101). 02/28/18  Yes Emokpae, Courage, MD  albuterol (PROAIR HFA) 108 (90 Base) MCG/ACT inhaler Inhale 2 puffs into the lungs every 6 (six) hours as needed for wheezing or shortness of breath. Shortness of breath 03/04/17  Yes Javier Glazier, MD  albuterol (PROVENTIL) (2.5 MG/3ML) 0.083% nebulizer solution Take 3 mLs (2.5 mg total) by nebulization 3 (three) times daily. Patient taking differently: Take 2.5 mg by nebulization every 6 (six) hours as needed for wheezing or shortness of breath.  08/14/16  Yes Javier Glazier, MD  Ascorbic Acid (VITAMIN C PO) Take 1 tablet by mouth every morning.    Yes [provider]  buPROPion (WELLBUTRIN XL) 150 MG 24 hr tablet Take 150 mg by mouth daily.   Yes [provider]  diltiazem (CARDIZEM CD) 120 MG 24 hr capsule Take 1 capsule (120 mg total) by mouth daily. 02/28/18 02/28/19 Yes Emokpae, Courage, MD  levothyroxine (SYNTHROID, LEVOTHROID) 50 MCG tablet Take 50 mcg by mouth daily before breakfast.   Yes [provider]  Multiple Vitamin (MULTIVITAMIN) capsule Take 1 capsule by mouth every morning.    Yes [provider]  pantoprazole (PROTONIX) 40 MG tablet Take 1 tablet (40 mg total) by mouth daily before breakfast. 02/28/18  Yes Emokpae, Courage, MD  polyethylene glycol (MIRALAX / GLYCOLAX) packet Take 17 g by mouth 2 (two) times daily. Patient taking differently: Take 17 g by mouth daily as needed for mild constipation or moderate constipation.  02/28/18  Yes Emokpae, Courage, MD  pravastatin (PRAVACHOL) 40 MG tablet Take 1 tablet (40 mg total) by mouth at bedtime. Patient taking differently: Take 40 mg by mouth every morning.  02/28/18  Yes Emokpae, Courage, MD  tiotropium (SPIRIVA) 18 MCG inhalation capsule Place 1 capsule (18 mcg total) into inhaler and inhale daily. Patient taking differently: Place 18 mcg into inhaler and inhale daily as needed (for shortness of breath).  02/26/17 08/01/18 Yes Javier Glazier,  MD  torsemide (DEMADEX) 20 MG tablet Take 1 tablet (20 mg total) by mouth once. Patient taking differently: Take 20 mg by mouth daily.  10/30/15  Yes Marshell Garfinkel, MD    Physical Exam: Vitals:   08/01/18 2030 08/01/18 2114 08/01/18 2137 08/01/18 2329  BP: (!) 165/77  (!) 145/77 (!) 158/63  Pulse: (!) 101   (!) 101  Resp: (!) 23  17 18   Temp:    97.8 F (36.6 C)  TempSrc:    Oral  SpO2: 99% 99%  99%  Weight:      Height:        Constitutional: NAD, calm, comfortable Eyes: PERRL, lids and conjunctivae normal ENMT: Mucous membranes are dry. Posterior pharynx clear of any exudate or lesions. Neck: normal, supple, no masses, no thyromegaly Respiratory: clear to auscultation bilaterally, no wheezing, no  crackles. Normal respiratory effort. No accessory muscle use.  Cardiovascular: Tachycardic at 102 bpm, no murmurs / rubs / gallops. No extremity edema. 2+ pedal pulses. No carotid bruits.  Abdomen: Soft, no tenderness, no masses palpated. No hepatosplenomegaly. Bowel sounds positive.  Musculoskeletal: no clubbing / cyanosis.  Shortening and externally rotated LLE.  Severely decreased ROM of LLE, no contractures. Normal muscle tone.  Skin: no rashes, lesions, ulcers. No induration Neurologic: CN 2-12 grossly intact. Sensation intact, DTR normal. Strength 5/5 in all 4.  Psychiatric: Normal judgment and insight. Alert and oriented x 3. Normal mood.   Labs on Admission: I have personally reviewed following labs and imaging studies  CBC: Recent Labs  Lab 08/01/18 1949  WBC 14.3*  NEUTROABS 12.0*  HGB 11.0*  HCT 34.9*  MCV 89.0  PLT 563   Basic Metabolic Panel: Recent Labs  Lab 08/01/18 1949  NA 138  K 2.6*  CL 91*  CO2 35*  GLUCOSE 158*  BUN 16  CREATININE 1.15*  CALCIUM 8.7*  MG 1.9  PHOS 3.6   GFR: Estimated Creatinine Clearance: 29.4 mL/min (A) (by C-G formula based on SCr of 1.15 mg/dL (H)). Liver Function Tests: Recent Labs  Lab 08/01/18 1949  AST 16  ALT 13  ALKPHOS 77  BILITOT 0.8  PROT 6.8  ALBUMIN 3.9   No results for input(s): LIPASE, AMYLASE in the last 168 hours. No results for input(s): AMMONIA in the last 168 hours. Coagulation Profile: Recent Labs  Lab 08/01/18 1949  INR 0.92   Cardiac Enzymes: Recent Labs  Lab 08/01/18 1949  TROPONINI <0.03   BNP (last 3 results) No results for input(s): PROBNP in the last 8760 hours. HbA1C: No results for input(s): HGBA1C in the last 72 hours. CBG: No results for input(s): GLUCAP in the last 168 hours. Lipid Profile: No results for input(s): CHOL, HDL, LDLCALC, TRIG, CHOLHDL, LDLDIRECT in the last 72 hours. Thyroid Function Tests: No results for input(s): TSH, T4TOTAL, FREET4, T3FREE, THYROIDAB in  the last 72 hours. Anemia Panel: No results for input(s): VITAMINB12, FOLATE, FERRITIN, TIBC, IRON, RETICCTPCT in the last 72 hours. Urine analysis:    Component Value Date/Time   COLORURINE YELLOW 08/01/2018 2151   APPEARANCEUR HAZY (A) 08/01/2018 2151   LABSPEC 1.009 08/01/2018 2151   PHURINE 6.0 08/01/2018 2151   GLUCOSEU NEGATIVE 08/01/2018 2151   HGBUR NEGATIVE 08/01/2018 2151   BILIRUBINUR NEGATIVE 08/01/2018 2151   [place] 08/01/2018 2151   PROTEINUR NEGATIVE 08/01/2018 2151   UROBILINOGEN 0.2 09/06/2007 2240   NITRITE NEGATIVE 08/01/2018 2151   LEUKOCYTESUR MODERATE (A) 08/01/2018 2151    Radiological Exams on Admission:  Dg Chest 1 View  Result Date: 08/01/2018 CLINICAL DATA:  Fall EXAM: CHEST  1 VIEW COMPARISON:  12/27/2015, CT 07/19/2017 FINDINGS: Hyperinflation with emphysematous disease. No acute opacity or pleural effusion. Normal heart size with aortic atherosclerosis. No pneumothorax. IMPRESSION: No active disease.  Hyperinflation with emphysematous disease Electronically Signed   By: Donavan Foil M.D.   On: 08/01/2018 18:29   Ct Head Wo Contrast  Result Date: 08/01/2018 CLINICAL DATA:  Status post fall. EXAM: CT HEAD WITHOUT CONTRAST TECHNIQUE: Contiguous axial images were obtained from the base of the skull through the vertex without intravenous contrast. COMPARISON:  02/26/2018 FINDINGS: Brain: No evidence of acute infarction, hemorrhage, hydrocephalus, extra-axial collection or mass lesion/mass effect. Moderate brain parenchymal volume loss and moderate deep white matter microangiopathy. Chronic enlarged sulcus along the left posterior vertex. Vascular: Calcific atherosclerotic disease of the intracavernous carotid arteries. Skull: Normal. Negative for fracture or focal lesion. Sinuses/Orbits: No acute finding. Other: None. IMPRESSION: No acute intracranial abnormality. Moderate brain parenchymal atrophy and chronic microvascular disease. Electronically  Signed   By: Fidela Salisbury M.D.   On: 08/01/2018 20:15   Dg Hip Unilat With Pelvis 2-3 Views Left  Result Date: 08/01/2018 CLINICAL DATA:  Fall with hip pain EXAM: DG HIP (WITH OR WITHOUT PELVIS) 2-3V LEFT COMPARISON:  02/26/2018 FINDINGS: Bones appear diffusely demineralized. SI joints are non widened. Pubic symphysis and rami appear intact. Normal alignment of right hip. Acute mildly comminuted and displaced left intertrochanteric fracture. Left femoral head demonstrates normal positioning. IMPRESSION: Acute mildly comminuted and displaced left intertrochanteric fracture. Electronically Signed   By: Donavan Foil M.D.   On: 08/01/2018 18:28    EKG: Independently reviewed. Vent. rate 100 BPM PR interval * ms QRS duration 102 ms QT/QTc 382/493 ms P-R-T axes 88 61 89 Sinus tachycardia Consider right atrial enlargement Anteroseptal infarct, age indeterminate Baseline wander Artifact  Assessment/Plan Principal Problem:   Closed left hip fracture, initial encounter ([place]) Admit to telemetry/inpatient. Keep n.p.o. Continue gentle IV fluids. Analgesics as needed. Hip fracture protocol orders. Orthopedic surgery will evaluate.  Active Problems:   Hypertension Continue diltiazem 120 mg p.o. daily. Hold torsemide. Monitor BP, HR, renal function electrolytes.    Hyperlipidemia Continue pravastatin 40 mg p.o. daily. Check LFTs.    Hypothyroidism TSH was 1.562 microunits/L. Continue levothyroxine 50 mcg p.o. daily. Monitor TSH as needed.    Depression Continue Wellbutrin XL 150 mg p.o. daily.    Diastolic dysfunction No signs of decompensation. Hold torsemide for now. Resume torsemide in 2 to 3 days.    Hypokalemia Replacing. Magnesium has been supplemented. Follow-up potassium level.    DVT prophylaxis: Heparin SQ. Code Status: Full code. Family Communication: Her son was present in the ED room. Disposition Plan: Admit to [facility] for orthopedic surgery  evaluation. Consults called: Orthopedic surgery on-call was contacted by the EDP. Admission status: Inpatient/telemetry.   Reubin Milan MD [facility] Pager [phone].  If 7PM-7AM, please contact night-coverage [email or web address] Password [facility]  08/01/2018, 11:32 PM   This document was prepared using Dragon voice recognition software and may contain some unintended transcription errors.

## 2018-08-01 NOTE — ED Notes (Signed)
Report to Ramond Craver

## 2018-08-01 NOTE — ED Provider Notes (Signed)
[facility] EMERGENCY DEPARTMENT Provider Note   CSN: [id number] Arrival date & time: 08/01/18  1723     History   Chief Complaint Chief Complaint  Patient presents with  . Fall    HPI [name] is a 77 y.o. female.   Fall   Pt was seen at 1745. Per EMS, pt's family and pt report: Pt c/o sudden onset and resolution of one episode of trip and fall that occurred PTA. Pt states she fell onto her left hip. Family states she may have also hit her head. Pt c/o left hip pain only.  EMS gave IV fentanyl en route for pain. Denies LOC, no AMS, no neck or back pain, no CP/SOB, no abd pain, no N/V/D, no focal motor weakness, no tingling/numbness in extremities.    Past Medical History:  Diagnosis Date  . CHF (congestive heart failure) ([place])   . COPD (chronic obstructive pulmonary disease) ([place])    home O2  . Diastolic dysfunction   . Dyspnea   . History of nuclear stress test 09/23/2009   dipyridamole; normal pattern of perfusion; low risk scan   . History of tobacco abuse   . Hyperlipidemia   . Hypertension   . Hypothyroidism   . Mild depression ([place])   . Pneumonia     Patient Active Problem List   Diagnosis Date Noted  . Closed sacral fracture/S2-S3 02/27/2018  . Leg weakness, bilateral 02/26/2018  . Fall at home, initial encounter 02/26/2018  . Oral thrush 02/26/2017  . Rectal bleeding 10/21/2016  . Abdominal pain 06/06/2015  . Dysphagia   . Chronic respiratory failure with hypoxia ([place]) 02/02/2014  . Alopecia 02/02/2014  . Loss of weight 01/24/2014  . Unspecified constipation 01/24/2014  . Esophageal dysphagia 01/24/2014  . COPD [place], frequent exacerbations   . Hypertension   . Hyperlipidemia   . Hypothyroidism   . Depression     Past Surgical History:  Procedure Laterality Date  . BIOPSY  01/13/2017   Procedure: BIOPSY;  Surgeon: Daneil Dolin, MD;  Location: [facility] ENDO SUITE;  Service: Endoscopy;;  right/left/sigmoid colon  . CATARACT EXTRACTION  W/PHACO  09/08/2012   Procedure: CATARACT EXTRACTION PHACO AND INTRAOCULAR LENS PLACEMENT (IOC);  Surgeon: Tonny Branch, MD;  Location: [facility] ORS;  Service: Ophthalmology;  Laterality: Right;  CDE:19.67  . CATARACT EXTRACTION W/PHACO  09/19/2012   Procedure: CATARACT EXTRACTION PHACO AND INTRAOCULAR LENS PLACEMENT (IOC);  Surgeon: Tonny Branch, MD;  Location: [facility] ORS;  Service: Ophthalmology;  Laterality: Left;  CDE:19.83  . COLONOSCOPY  2007   Dr. Gala Romney: internal hemorrhoids, benign polyps  . COLONOSCOPY N/A 01/13/2017   Procedure: COLONOSCOPY;  Surgeon: Daneil Dolin, MD;  Location: [facility] SUITE;  Service: Endoscopy;  Laterality: N/A;  12:15pm  . ESOPHAGOGASTRODUODENOSCOPY  2007   Dr. Gala Romney: normal esophagus, non-critical Schatzki's ring not manipulated, normal small bowel biopsy  . ESOPHAGOGASTRODUODENOSCOPY (EGD) WITH ESOPHAGEAL DILATION N/A 10/16/2013   Dr. Fields:Schatzki's ring at the gastroesophageal junction s/p dilation/MODERATE Erosive gastritis. reactive gastropathy.   Marland Kitchen Grant  . right carpal tunnel release    . TONSILLECTOMY    . TOTAL ABDOMINAL HYSTERECTOMY  1980's  . TRANSTHORACIC ECHOCARDIOGRAM  09/23/2009   EF>55%; trace TR; normal LA & RA size; normal LV & RV size & systolic function      OB History   None      Home Medications    Prior to Admission medications   Medication Sig Start Date  End Date Taking? Authorizing Provider  acetaminophen (TYLENOL) 325 MG tablet Take 2 tablets (650 mg total) by mouth every 6 (six) hours as needed for mild pain (or Fever >/= 101). 02/28/18   Roxan Hockey, MD  albuterol (PROAIR HFA) 108 (90 Base) MCG/ACT inhaler Inhale 2 puffs into the lungs every 6 (six) hours as needed for wheezing or shortness of breath. Shortness of breath 03/04/17   Javier Glazier, MD  albuterol (PROVENTIL) (2.5 MG/3ML) 0.083% nebulizer solution Take 3 mLs (2.5 mg total) by nebulization 3 (three) times daily. 08/14/16   Javier Glazier, MD    ALPRAZolam Duanne Moron) 0.25 MG tablet Take 1 tablet (0.25 mg total) by mouth 2 (two) times daily as needed for anxiety or sleep. 02/28/18   Roxan Hockey, MD  Ascorbic Acid (VITAMIN C PO) Take 1 tablet by mouth daily.    [provider]  buPROPion (WELLBUTRIN XL) 150 MG 24 hr tablet Take 150 mg by mouth daily.    [provider]  calcitonin, salmon, (MIACALCIN/FORTICAL) 200 UNIT/ACT nasal spray Place 1 spray into alternate nostrils daily. 03/01/18   Roxan Hockey, MD  calcium-vitamin D (OSCAL WITH D) 500-200 MG-UNIT tablet Take 1 tablet by mouth 2 (two) times daily. 02/28/18   Roxan Hockey, MD  carboxymethylcellulose (REFRESH PLUS) 0.5 % SOLN Place 1 drop into both eyes daily as needed (dry eyes).     [provider]  cetirizine (ZYRTEC) 10 MG tablet Take 10 mg by mouth daily as needed for allergies.    [provider]  citalopram (CELEXA) 10 MG tablet Take 10 mg by mouth daily.    [provider]  diltiazem (CARDIZEM CD) 120 MG 24 hr capsule Take 1 capsule (120 mg total) by mouth daily. 02/28/18 02/28/19  Roxan Hockey, MD  Fluticasone Furoate-Vilanterol (BREO ELLIPTA) 100-25 MCG/INH AEPB Inhale 1 puff into the lungs daily. 03/19/15   Elsie Stain, MD  levothyroxine (SYNTHROID, LEVOTHROID) 50 MCG tablet Take 50 mcg by mouth daily before breakfast.    [provider]  methocarbamol (ROBAXIN) 500 MG tablet Take 1 tablet (500 mg total) by mouth 2 (two) times daily. 02/28/18   Roxan Hockey, MD  Multiple Vitamin (MULTIVITAMIN) capsule Take 1 capsule by mouth daily.      [provider]  nystatin (MYCOSTATIN) 100000 UNIT/ML suspension 72ml QID swish and swallow Patient not taking: Reported on 02/26/2018 02/26/17   Javier Glazier, MD  ondansetron (ZOFRAN) 4 MG tablet Take 1 tablet (4 mg total) by mouth every 6 (six) hours as needed for nausea. 02/28/18   Roxan Hockey, MD  OVER THE COUNTER MEDICATION Place 2 drops in ear(s)  daily as needed (ear wax).    [provider]  pantoprazole (PROTONIX) 40 MG tablet Take 1 tablet (40 mg total) by mouth daily before breakfast. 02/28/18   Denton Brick, Cour[place], MD  polyethylene glycol (MIRALAX / GLYCOLAX) packet Take 17 g by mouth 2 (two) times daily. 02/28/18   Roxan Hockey, MD  Potassium 99 MG TABS Take 99 mg by mouth daily.    [provider]  pravastatin (PRAVACHOL) 40 MG tablet Take 1 tablet (40 mg total) by mouth at bedtime. 02/28/18   Roxan Hockey, MD  predniSONE (DELTASONE) 10 MG tablet Take 10 mg by mouth 4 (four) times daily as needed (COPD Flare up). For 10 days    [provider]  senna-docusate (SENOKOT-S) 8.6-50 MG tablet Take 2 tablets by mouth at bedtime. 02/28/18   Roxan Hockey, MD  tiotropium (SPIRIVA) 18 MCG inhalation capsule Place 1 capsule (18 mcg total) into inhaler and inhale daily. 02/26/17 05/18/18  Javier Glazier, MD  torsemide (DEMADEX) 20 MG tablet Take 1 tablet (20 mg total) by mouth once. Patient taking differently: Take 20 mg by mouth daily as needed (for swelling).  10/30/15   Marshell Garfinkel, MD    Family History Family History  Problem Relation Age of Onset  . Allergies Father   . Heart disease Mother   . Rheum arthritis Mother   . Hypertension Mother   . Stroke Maternal Grandmother   . Breast cancer Daughter   . Breast cancer Sister   . Cancer Maternal Aunt   . Stroke Sister   . Colon cancer Neg Hx     Social History Social History   Tobacco Use  . Smoking status: Former Smoker    Packs/day: 1.00    Years: 50.00    Pack years: 50.00    Types: Cigarettes    Last attempt to quit: 09/07/2008    Years since quitting: 9.9  . Smokeless tobacco: Never Used  Substance Use Topics  . Alcohol use: No    Alcohol/week: 0.0 standard drinks  . Drug use: No     Allergies   Bee venom   Review of Systems Review of Systems ROS: Statement: All systems negative except as marked or noted in the HPI;  Constitutional: Negative for fever and chills. ; ; Eyes: Negative for eye pain, redness and discharge. ; ; ENMT: Negative for ear pain, hoarseness, nasal congestion, sinus pressure and sore throat. ; ; Cardiovascular: Negative for chest pain, palpitations, diaphoresis, dyspnea and peripheral edema. ; ; Respiratory: Negative for cough, wheezing and stridor. ; ; Gastrointestinal: Negative for nausea, vomiting, diarrhea, abdominal pain, blood in stool, hematemesis, jaundice and rectal bleeding. . ; ; Genitourinary: Negative for dysuria, flank pain and hematuria. ; ; Musculoskeletal: Negative for back pain and neck pain. Negative for swelling. +left hip pain.; ; Skin: Negative for pruritus, rash, abrasions, blisters, bruising and skin lesion.; ; Neuro: Negative for headache, lightheadedness and neck stiffness. Negative for weakness, altered level of consciousness, altered mental status, extremity weakness, paresthesias, involuntary movement, seizure and syncope.       Physical Exam Updated Vital Signs BP (!) 154/83   Pulse 92   Temp 98.1 F (36.7 C) (Oral)   Resp 20   Ht 5' (1.524 m)   Wt 45.4 kg   SpO2 97%   BMI 19.53 kg/m   Physical Exam 1750: Physical examination:  Nursing notes reviewed; Vital signs and O2 SAT reviewed;  Constitutional: Well developed, Well nourished, Well hydrated, In no acute distress; Head:  Normocephalic, atraumatic; Eyes: EOMI, PERRL, No scleral icterus; ENMT: Mouth and pharynx normal, Mucous membranes moist; Neck: Supple, Full range of motion, No lymphadenopathy; Cardiovascular: Regular rate and rhythm, No gallop; Respiratory: Breath sounds clear & equal bilaterally, No wheezes.  Speaking full sentences with ease, Normal respiratory effort/excursion; Chest: Nontender, Movement normal; Abdomen: Soft, Nontender, Nondistended, Normal bowel sounds; Genitourinary: No CVA tenderness; Spine:  No midline CS, TS, LS tenderness.;; Extremities: Peripheral pulses normal, Pelvis stable.  +TTP left hip with LLE shortened and externally rotated. NT left knee/ankle/foot. NT RLE, bilat UE's.  No edema, No calf edema or asymmetry.; Neuro: AA&Ox3, Major CN grossly intact.  Speech clear. Grips equal. +decreased ROM left hip, otherwise no gross focal motor or sensory deficits in extremities.; Skin: Color normal, Warm, Dry.   ED Treatments / Results  Labs (  all labs ordered are listed, but only abnormal results are displayed)   EKG EKG Interpretation  Date/Time:  Monday August 01 2018 19:28:39 EST Ventricular Rate:  100 PR Interval:    QRS Duration: 102 QT Interval:  382 QTC Calculation: 493 R Axis:   61 Text Interpretation:  Sinus tachycardia Consider right atrial enlargement Anteroseptal infarct, age indeterminate Baseline wander Artifact When compared with ECG of 02/26/2018 No significant change was found Confirmed by Francine Graven [phone]) on 08/01/2018 7:56:34 PM   Radiology   Procedures Procedures (including critical care time)  Medications Ordered in ED Medications  morphine 2 MG/ML injection 2 mg (has no administration in time range)  ondansetron (ZOFRAN) injection 4 mg (has no administration in time range)     Initial Impression / Assessment and Plan / ED Course  I have reviewed the triage vital signs and the nursing notes.  Pertinent labs & imaging results that were available during my care of the patient were reviewed by me and considered in my medical decision making (see chart for details).  MDM Reviewed: previous chart, nursing note and vitals Reviewed previous: labs and ECG Interpretation: labs, ECG, x-ray and CT scan    Results for orders placed or performed during the hospital encounter of 18/56/31  Basic metabolic panel  Result Value Ref Range   Sodium 138 135 - 145 mmol/L   Potassium 2.6 (LL) 3.5 - 5.1 mmol/L   Chloride 91 (L) 98 - 111 mmol/L   CO2 35 (H) 22 - 32 mmol/L   Glucose, Bld 158 (H) 70 - 99 mg/dL   BUN 16 8 - 23 mg/dL    Creatinine, Ser 1.15 (H) 0.44 - 1.00 mg/dL   Calcium 8.7 (L) 8.9 - 10.3 mg/dL   GFR calc non Af Amer 45 (L) >60 mL/min   GFR calc Af Amer 52 (L) >60 mL/min   Anion gap 12 5 - 15  Troponin I - Once  Result Value Ref Range   Troponin I <0.03 <0.03 ng/mL  CBC with Differential  Result Value Ref Range   WBC 14.3 (H) 4.0 - 10.5 K/uL   RBC 3.92 3.87 - 5.11 MIL/uL   Hemoglobin 11.0 (L) 12.0 - 15.0 g/dL   HCT 34.9 (L) 36.0 - 46.0 %   MCV 89.0 80.0 - 100.0 fL   MCH 28.1 26.0 - 34.0 pg   MCHC 31.5 30.0 - 36.0 g/dL   RDW 12.5 11.5 - 15.5 %   Platelets 298 150 - 400 K/uL   nRBC 0.0 0.0 - 0.2 %   Neutrophils Relative % 84 %   Neutro Abs 12.0 (H) 1.7 - 7.7 K/uL   Lymphocytes Relative 7 %   Lymphs Abs 1.0 0.7 - 4.0 K/uL   Monocytes Relative 8 %   Monocytes Absolute 1.1 (H) 0.1 - 1.0 K/uL   Eosinophils Relative 1 %   Eosinophils Absolute 0.2 0.0 - 0.5 K/uL   Basophils Relative 0 %   Basophils Absolute 0.0 0.0 - 0.1 K/uL   Immature Granulocytes 0 %   Abs Immature Granulocytes 0.05 0.00 - 0.07 K/uL  Protime-INR  Result Value Ref Range   Prothrombin Time 12.3 11.4 - 15.2 seconds   INR 0.92    Dg Chest 1 View Result Date: 08/01/2018 CLINICAL DATA:  Fall EXAM: CHEST  1 VIEW COMPARISON:  12/27/2015, CT 07/19/2017 FINDINGS: Hyperinflation with emphysematous disease. No acute opacity or pleural effusion. Normal heart size with aortic atherosclerosis. No pneumothorax. IMPRESSION: No active disease.  Hyperinflation with emphysematous disease Electronically Signed   By: Donavan Foil M.D.   On: 08/01/2018 18:29   Ct Head Wo Contrast Result Date: 08/01/2018 CLINICAL DATA:  Status post fall. EXAM: CT HEAD WITHOUT CONTRAST TECHNIQUE: Contiguous axial images were obtained from the base of the skull through the vertex without intravenous contrast. COMPARISON:  02/26/2018 FINDINGS: Brain: No evidence of acute infarction, hemorrhage, hydrocephalus, extra-axial collection or mass lesion/mass effect. Moderate  brain parenchymal volume loss and moderate deep white matter microangiopathy. Chronic enlarged sulcus along the left posterior vertex. Vascular: Calcific atherosclerotic disease of the intracavernous carotid arteries. Skull: Normal. Negative for fracture or focal lesion. Sinuses/Orbits: No acute finding. Other: None. IMPRESSION: No acute intracranial abnormality. Moderate brain parenchymal atrophy and chronic microvascular disease. Electronically Signed   By: Fidela Salisbury M.D.   On: 08/01/2018 20:15   Dg Hip Unilat With Pelvis 2-3 Views Left Result Date: 08/01/2018 CLINICAL DATA:  Fall with hip pain EXAM: DG HIP (WITH OR WITHOUT PELVIS) 2-3V LEFT COMPARISON:  02/26/2018 FINDINGS: Bones appear diffusely demineralized. SI joints are non widened. Pubic symphysis and rami appear intact. Normal alignment of right hip. Acute mildly comminuted and displaced left intertrochanteric fracture. Left femoral head demonstrates normal positioning. IMPRESSION: Acute mildly comminuted and displaced left intertrochanteric fracture. Electronically Signed   By: Donavan Foil M.D.   On: 08/01/2018 18:28    1915:  XR with hip fx. T/C returned from [facility] Dr. Marcelino Scot, case discussed, including:  HPI, pertinent PM/SHx, VS/PE, dx testing, ED course and treatment:  Agreeable to consult, likely OR tomorrow, requests to transfer to [facility] under [facility] service.   2100:  Potassium repleted PO. Magnesium level ordered. Dx and testing d/w pt and family.  Questions answered.  Verb understanding, agreeable to transfer/admit to [facility]. T/C returned from [facility] Dr. Olevia Bowens, case discussed, including:  HPI, pertinent PM/SHx, VS/PE, dx testing, ED course and treatment:  Agreeable to admit.    Final Clinical Impressions(s) / ED Diagnoses   Final diagnoses:  None    ED Discharge Orders    None       Francine Graven, DO 08/04/18 1639

## 2018-08-01 NOTE — ED Notes (Signed)
CRITICAL VALUE ALERT  Critical Value:  Potassium 2.6  Date & Time Notied:  08/01/18 2038  Provider Notified: [email or web address]  Orders Received/Actions taken: md notified

## 2018-08-02 ENCOUNTER — Inpatient Hospital Stay (HOSPITAL_COMMUNITY): Payer: Medicare Other

## 2018-08-02 DIAGNOSIS — I5189 Other ill-defined heart diseases: Secondary | ICD-10-CM

## 2018-08-02 DIAGNOSIS — T148XXA Other injury of unspecified body region, initial encounter: Secondary | ICD-10-CM

## 2018-08-02 DIAGNOSIS — E440 Moderate protein-calorie malnutrition: Secondary | ICD-10-CM

## 2018-08-02 LAB — BASIC METABOLIC PANEL
ANION GAP: 7 (ref 5–15)
BUN: 14 mg/dL (ref 8–23)
CALCIUM: 8.8 mg/dL — AB (ref 8.9–10.3)
CO2: 36 mmol/L — ABNORMAL HIGH (ref 22–32)
Chloride: 99 mmol/L (ref 98–111)
Creatinine, Ser: 1.23 mg/dL — ABNORMAL HIGH (ref 0.44–1.00)
GFR, EST AFRICAN AMERICAN: 49 mL/min — AB (ref >60)
GFR, EST NON AFRICAN AMERICAN: 42 mL/min — AB (ref >60)
Glucose, Bld: 113 mg/dL — ABNORMAL HIGH (ref 70–99)
POTASSIUM: 3.7 mmol/L (ref 3.5–5.1)
SODIUM: 142 mmol/L (ref 135–145)

## 2018-08-02 LAB — MRSA PCR SCREENING: MRSA BY PCR: NEGATIVE

## 2018-08-02 LAB — TSH: TSH: 1.562 u[IU]/mL (ref 0.350–4.500)

## 2018-08-02 LAB — ABO/RH: ABO/RH(D): A POS

## 2018-08-02 MED ORDER — DILTIAZEM HCL ER COATED BEADS 120 MG PO CP24
120.0000 mg | ORAL_CAPSULE | Freq: Every day | ORAL | Status: DC
  Administered 2018-08-03 – 2018-08-08 (×6): 120 mg via ORAL
  Filled 2018-08-02 (×6): qty 1

## 2018-08-02 MED ORDER — PRAVASTATIN SODIUM 40 MG PO TABS
40.0000 mg | ORAL_TABLET | Freq: Every morning | ORAL | Status: DC
  Administered 2018-08-04 – 2018-08-08 (×5): 40 mg via ORAL
  Filled 2018-08-02 (×5): qty 1

## 2018-08-02 MED ORDER — TIOTROPIUM BROMIDE MONOHYDRATE 18 MCG IN CAPS
18.0000 ug | ORAL_CAPSULE | Freq: Every day | RESPIRATORY_TRACT | Status: DC
  Filled 2018-08-02: qty 5

## 2018-08-02 MED ORDER — POLYETHYLENE GLYCOL 3350 17 G PO PACK: 17.0000 g | PACK | Freq: Every day | ORAL | Status: DC | PRN

## 2018-08-02 MED ORDER — PANTOPRAZOLE SODIUM 40 MG PO TBEC
40.0000 mg | DELAYED_RELEASE_TABLET | Freq: Every day | ORAL | Status: DC
  Administered 2018-08-04 – 2018-08-08 (×5): 40 mg via ORAL
  Filled 2018-08-02 (×5): qty 1

## 2018-08-02 MED ORDER — LEVALBUTEROL HCL 0.63 MG/3ML IN NEBU
0.6300 mg | INHALATION_SOLUTION | Freq: Four times a day (QID) | RESPIRATORY_TRACT | Status: DC | PRN
  Administered 2018-08-05: 0.63 mg via RESPIRATORY_TRACT
  Filled 2018-08-02: qty 3

## 2018-08-02 MED ORDER — ADULT MULTIVITAMIN W/MINERALS CH
1.0000 | ORAL_TABLET | Freq: Every day | ORAL | Status: DC
  Administered 2018-08-04 – 2018-08-08 (×5): 1 via ORAL
  Filled 2018-08-02 (×5): qty 1

## 2018-08-02 MED ORDER — CEFAZOLIN SODIUM-DEXTROSE 2-4 GM/100ML-% IV SOLN
2.0000 g | INTRAVENOUS | Status: AC
  Administered 2018-08-03: 2 g via INTRAVENOUS
  Filled 2018-08-02: qty 100

## 2018-08-02 MED ORDER — UMECLIDINIUM BROMIDE 62.5 MCG/INH IN AEPB
1.0000 | INHALATION_SPRAY | Freq: Every day | RESPIRATORY_TRACT | Status: DC
  Administered 2018-08-04 – 2018-08-07 (×5): 1 via RESPIRATORY_TRACT
  Filled 2018-08-02: qty 7

## 2018-08-02 MED ORDER — LEVOTHYROXINE SODIUM 50 MCG PO TABS
50.0000 ug | ORAL_TABLET | Freq: Every day | ORAL | Status: DC
  Administered 2018-08-04 – 2018-08-08 (×5): 50 ug via ORAL
  Filled 2018-08-02 (×5): qty 1

## 2018-08-02 MED ORDER — ALBUTEROL SULFATE (2.5 MG/3ML) 0.083% IN NEBU: 2.5000 mg | INHALATION_SOLUTION | Freq: Four times a day (QID) | RESPIRATORY_TRACT | Status: DC | PRN

## 2018-08-02 MED ORDER — ENSURE ENLIVE PO LIQD
237.0000 mL | Freq: Two times a day (BID) | ORAL | Status: DC
  Administered 2018-08-03 – 2018-08-07 (×8): 237 mL via ORAL

## 2018-08-02 MED ORDER — BUPROPION HCL ER (XL) 150 MG PO TB24
150.0000 mg | ORAL_TABLET | Freq: Every day | ORAL | Status: DC
  Administered 2018-08-04 – 2018-08-08 (×5): 150 mg via ORAL
  Filled 2018-08-02 (×5): qty 1

## 2018-08-02 NOTE — Progress Notes (Signed)
[facility]   Pt seen and examined Full consult to follow   OR tomorrow at 0800 for IMN L hip   Would anticipate need for SNF at discharge  NPO after MN  Jari Pigg, PA-C [phone] (C) 08/02/2018, 5:14 PM  [facility] [place] [phone] Domingo Sep (F)

## 2018-08-02 NOTE — Consult Note (Signed)
Orthopaedic Trauma Service (OTS) Consult   Patient ID: [name] MRN: [id number] DOB/AGE: 1940-11-13 77 y.o.   Reason for Consult: Left hip fracture Referring Physician:  Tennis Must, MD   HPI: [name] is an 77 y.o. white female with medical history notable for CHF, COPD on 3 L O2, hypertension, hypothyroidism and dementia who presented to the hospital late yesterday evening with complaint of left hip pain.  Patient states that she was at home when she tripped over her oxygen cord causing her to fall on her left hip.  Patient had immediate onset of pain and inability to bear weight.  She was brought to the hospital and found to have a left intertrochanteric hip fracture.  Orthopedics was consulted for management of her hip fracture.  Patient admitted to the medical service.  Patient was initially seen at [facility] but as they did not have any orthopedic coverage last night she was transferred to [facility] hospital for definitive management per orthopedic injury.  Patient was seen and evaluated by the orthopedic trauma service on 08/02/2018.  Her primary complaint is left hip pain.  Pain is exacerbated with movement and is relieved with rest and pain medications.  Pain is been pretty constant since onset.  Located primarily about her right hip region.  No significant radiation down her leg or into her back.  Patient lives alone with her husband in a house.  Her husband is also of poor health but can go to and from the store to get food and cook her food.  Patients husband would be unable to pick her up if she were to fall at home.   Patient is not on any vitamin D or calcium supplementation she states that she had a bone density scan about 1 to 2 years ago however I do not see this in the system.  Last bone density scan I see done was from 2016.  T-scores of her hip are within the osteoporotic range.  Patient also mentions that she may have been on some medication for her  bone health many years ago and believes that it was a once monthly injection but she is unable to recall the name of the medication.  States that she only stayed on it for a year and then stopped.  Patient's physical activity is limited due to her COPD.  She does ambulate with a walker at baseline.  Past Medical History:  Diagnosis Date  . CHF (congestive heart failure) ([place])   . COPD (chronic obstructive pulmonary disease) ([place])    home O2  . Diastolic dysfunction   . Dyspnea   . History of nuclear stress test 09/23/2009   dipyridamole; normal pattern of perfusion; low risk scan   . History of tobacco abuse   . Hyperlipidemia   . Hypertension   . Hypothyroidism   . Mild depression ([place])   . Pneumonia     Past Surgical History:  Procedure Laterality Date  . BIOPSY  01/13/2017   Procedure: BIOPSY;  Surgeon: Daneil Dolin, MD;  Location: [facility];  Service: Endoscopy;;  right/left/sigmoid colon  . CATARACT EXTRACTION W/PHACO  09/08/2012   Procedure: CATARACT EXTRACTION PHACO AND INTRAOCULAR LENS PLACEMENT (IOC);  Surgeon: Tonny Branch, MD;  Location: [facility] ORS;  Service: Ophthalmology;  Laterality: Right;  CDE:19.67  . CATARACT EXTRACTION W/PHACO  09/19/2012   Procedure: CATARACT EXTRACTION PHACO AND INTRAOCULAR LENS PLACEMENT (IOC);  Surgeon: Tonny Branch, MD;  Location: [facility] ORS;  Service: Ophthalmology;  Laterality: Left;  CDE:19.83  . COLONOSCOPY  2007   Dr. Rourk: internal hemorrhoids, benign polyps  . COLONOSCOPY N/A 01/13/2017   Procedure: COLONOSCOPY;  Surgeon: Rourk, Robert M, MD;  Location: [facility];  Service: Endoscopy;  Laterality: N/A;  12:15pm  . ESOPHAGOGASTRODUODENOSCOPY  2007   Dr. Rourk: normal esophagus, non-critical Schatzki's ring not manipulated, normal small bowel biopsy  . ESOPHAGOGASTRODUODENOSCOPY (EGD) WITH ESOPHAGEAL DILATION N/A 10/16/2013   Dr. Fields:Schatzki's ring at the gastroesophageal junction s/p dilation/MODERATE Erosive gastritis. reactive gastropathy.    . GALLBLADDER SURGERY  1977  . right carpal tunnel release    . TONSILLECTOMY    . TOTAL ABDOMINAL HYSTERECTOMY  1980's  . TRANSTHORACIC ECHOCARDIOGRAM  09/23/2009   EF>55%; trace TR; normal LA & RA size; normal LV & RV size & systolic function     Family History  Problem Relation Age of Onset  . Allergies Father   . Heart disease Mother   . Rheum arthritis Mother   . Hypertension Mother   . Stroke Maternal Grandmother   . Breast cancer Daughter   . Breast cancer Sister   . Cancer Maternal Aunt   . Stroke Sister   . Colon cancer Neg Hx     Social History:  reports that she quit smoking about 9 years ago. Her smoking use included cigarettes. She has a 50.00 pack-year smoking history. She has never used smokeless tobacco. She reports that she does not drink alcohol or use drugs.  Allergies:  Allergies  Allergen Reactions  . Bee Venom Swelling    SWELLING REACTION UNSPECIFIED     Medications: I have reviewed the patient's current medications. Current Meds  Medication Sig  . acetaminophen (TYLENOL) 325 MG tablet Take 2 tablets (650 mg total) by mouth every 6 (six) hours as needed for mild pain (or Fever >/= 101).  . albuterol (PROAIR HFA) 108 (90 Base) MCG/ACT inhaler Inhale 2 puffs into the lungs every 6 (six) hours as needed for wheezing or shortness of breath. Shortness of breath  . albuterol (PROVENTIL) (2.5 MG/3ML) 0.083% nebulizer solution Take 3 mLs (2.5 mg total) by nebulization 3 (three) times daily. (Patient taking differently: Take 2.5 mg by nebulization every 6 (six) hours as needed for wheezing or shortness of breath. )  . Ascorbic Acid (VITAMIN C PO) Take 1 tablet by mouth every morning.   . buPROPion (WELLBUTRIN XL) 150 MG 24 hr tablet Take 150 mg by mouth daily.  . diltiazem (CARDIZEM CD) 120 MG 24 hr capsule Take 1 capsule (120 mg total) by mouth daily.  . levothyroxine (SYNTHROID, LEVOTHROID) 50 MCG tablet Take 50 mcg by mouth daily before breakfast.  .  Multiple Vitamin (MULTIVITAMIN) capsule Take 1 capsule by mouth every morning.   . pantoprazole (PROTONIX) 40 MG tablet Take 1 tablet (40 mg total) by mouth daily before breakfast.  . polyethylene glycol (MIRALAX / GLYCOLAX) packet Take 17 g by mouth 2 (two) times daily. (Patient taking differently: Take 17 g by mouth daily as needed for mild constipation or moderate constipation. )  . pravastatin (PRAVACHOL) 40 MG tablet Take 1 tablet (40 mg total) by mouth at bedtime. (Patient taking differently: Take 40 mg by mouth every morning. )  . tiotropium (SPIRIVA) 18 MCG inhalation capsule Place 1 capsule (18 mcg total) into inhaler and inhale daily. (Patient taking differently: Place 18 mcg into inhaler and inhale daily as needed (for shortness of breath). )  . torsemide (  DEMADEX) 20 MG tablet Take 1 tablet (20 mg total) by mouth once. (Patient taking differently: Take 20 mg by mouth daily. )     Results for orders placed or performed during the hospital encounter of 08/01/18 (from the past 48 hour(s))  Basic metabolic panel     Status: Abnormal   Collection Time: 08/01/18  7:49 PM  Result Value Ref Range   Sodium 138 135 - 145 mmol/L   Potassium 2.6 (LL) 3.5 - 5.1 mmol/L    Comment: CRITICAL RESULT CALLED TO, READ BACK BY AND VERIFIED WITH: MYRICK,B AT 2030 ON 11.25.2019 BY ISLEY,B    Chloride 91 (L) 98 - 111 mmol/L   CO2 35 (H) 22 - 32 mmol/L   Glucose, Bld 158 (H) 70 - 99 mg/dL   BUN 16 8 - 23 mg/dL   Creatinine, Ser 1.15 (H) 0.44 - 1.00 mg/dL   Calcium 8.7 (L) 8.9 - 10.3 mg/dL   GFR calc non Af Amer 45 (L) >60 mL/min   GFR calc Af Amer 52 (L) >60 mL/min    Comment: (NOTE) The eGFR has been calculated using the CKD EPI equation. This calculation has not been validated in all clinical situations. eGFR's persistently <60 mL/min signify possible Chronic Kidney Disease.    Anion gap 12 5 - 15    Comment: Performed at [facility], [address] [place], [place] [address]  Troponin I -  Once     Status: None   Collection Time: 08/01/18  7:49 PM  Result Value Ref Range   Troponin I <0.03 <0.03 ng/mL    Comment: Performed at [facility], [address] [place], [place] [address]  CBC with Differential     Status: Abnormal   Collection Time: 08/01/18  7:49 PM  Result Value Ref Range   WBC 14.3 (H) 4.0 - 10.5 K/uL   RBC 3.92 3.87 - 5.11 MIL/uL   Hemoglobin 11.0 (L) 12.0 - 15.0 g/dL   HCT 34.9 (L) 36.0 - 46.0 %   MCV 89.0 80.0 - 100.0 fL   MCH 28.1 26.0 - 34.0 pg   MCHC 31.5 30.0 - 36.0 g/dL   RDW 12.5 11.5 - 15.5 %   Platelets 298 150 - 400 K/uL   nRBC 0.0 0.0 - 0.2 %   Neutrophils Relative % 84 %   Neutro Abs 12.0 (H) 1.7 - 7.7 K/uL   Lymphocytes Relative 7 %   Lymphs Abs 1.0 0.7 - 4.0 K/uL   Monocytes Relative 8 %   Monocytes Absolute 1.1 (H) 0.1 - 1.0 K/uL   Eosinophils Relative 1 %   Eosinophils Absolute 0.2 0.0 - 0.5 K/uL   Basophils Relative 0 %   Basophils Absolute 0.0 0.0 - 0.1 K/uL   Immature Granulocytes 0 %   Abs Immature Granulocytes 0.05 0.00 - 0.07 K/uL    Comment: Performed at [facility], [address] [place], [place] [address]  Protime-INR     Status: None   Collection Time: 08/01/18  7:49 PM  Result Value Ref Range   Prothrombin Time 12.3 11.4 - 15.2 seconds   INR 0.92     Comment: Performed at [facility], [address] [place], [place] [address]  Magnesium     Status: None   Collection Time: 08/01/18  7:49 PM  Result Value Ref Range   Magnesium 1.9 1.7 - 2.4 mg/dL    Comment: Performed at [facility], [address] [place], [place] [address]  Phosphorus       Status: None   Collection Time: 08/01/18  7:49 PM  Result Value Ref Range   Phosphorus 3.6 2.5 - 4.6 mg/dL    Comment: Performed at [facility], [address] [place], [place] [address]  Hepatic function panel     Status: None   Collection Time: 08/01/18  7:49 PM  Result Value Ref Range   Total Protein 6.8 6.5 - 8.1 g/dL   Albumin 3.9 3.5 - 5.0 g/dL   AST 16 15 - 41  U/L   ALT 13 0 - 44 U/L   Alkaline Phosphatase 77 38 - 126 U/L   Total Bilirubin 0.8 0.3 - 1.2 mg/dL   Bilirubin, Direct 0.1 0.0 - 0.2 mg/dL   Indirect Bilirubin 0.7 0.3 - 0.9 mg/dL    Comment: Performed at [facility], [address] [place], [place] [address]  Urinalysis, Routine w reflex microscopic     Status: Abnormal   Collection Time: 08/01/18  9:51 PM  Result Value Ref Range   Color, Urine YELLOW YELLOW   APPearance HAZY (A) CLEAR   Specific Gravity, Urine 1.009 1.005 - 1.030   pH 6.0 5.0 - 8.0   Glucose, UA NEGATIVE NEGATIVE mg/dL   Hgb urine dipstick NEGATIVE NEGATIVE   Bilirubin Urine NEGATIVE NEGATIVE   Ketones, ur NEGATIVE NEGATIVE mg/dL   Protein, ur NEGATIVE NEGATIVE mg/dL   Nitrite NEGATIVE NEGATIVE   Leukocytes, UA MODERATE (A) NEGATIVE   RBC / HPF 0-5 0 - 5 RBC/hpf   WBC, UA >50 (H) 0 - 5 WBC/hpf   Bacteria, UA RARE (A) NONE SEEN   Squamous Epithelial / LPF 0-5 0 - 5   Hyaline Casts, UA PRESENT    Ca Oxalate Crys, UA PRESENT     Comment: Performed at [facility], [address] [place], [place] [address]  TSH     Status: None   Collection Time: 08/02/18  1:24 AM  Result Value Ref Range   TSH 1.562 0.350 - 4.500 uIU/mL    Comment: Performed by a 3rd Generation assay with a functional sensitivity of <=0.01 uIU/mL. Performed at [facility], [address] [place], [place] [address]   MRSA PCR Screening     Status: None   Collection Time: 08/02/18  1:56 AM  Result Value Ref Range   MRSA by PCR NEGATIVE NEGATIVE    Comment:        The GeneXpert MRSA Assay (FDA approved for NASAL specimens only), is one component of a comprehensive MRSA colonization surveillance program. It is not intended to diagnose MRSA infection nor to guide or monitor treatment for MRSA infections. Performed at [facility], [address] [place], [place] [address]   Basic metabolic panel     Status: Abnormal   Collection Time: 08/02/18  8:42 AM  Result Value Ref  Range   Sodium 142 135 - 145 mmol/L   Potassium 3.7 3.5 - 5.1 mmol/L   Chloride 99 98 - 111 mmol/L   CO2 36 (H) 22 - 32 mmol/L   Glucose, Bld 113 (H) 70 - 99 mg/dL   BUN 14 8 - 23 mg/dL   Creatinine, Ser 1.23 (H) 0.44 - 1.00 mg/dL   Calcium 8.8 (L) 8.9 - 10.3 mg/dL   GFR calc non Af Amer 42 (L) >60 mL/min   GFR calc Af Amer 49 (L) >60 mL/min   Anion gap 7 5 - 15    Comment: Performed at [facility], [address] [place], [place] [address]    Dg Chest 1 View  Result Date: 08/01/2018 CLINICAL DATA:  Fall EXAM: CHEST  1 VIEW COMPARISON:  12/27/2015, CT 07/19/2017 FINDINGS: Hyperinflation with emphysematous disease. No acute opacity or pleural effusion. Normal heart size with aortic atherosclerosis. No pneumothorax. IMPRESSION: No active disease.  Hyperinflation with emphysematous disease Electronically Signed   By: Kim  Fujinaga M.D.   On: 08/01/2018 18:29   Ct Head Wo Contrast  Result Date: 08/01/2018 CLINICAL DATA:  Status post fall. EXAM: CT HEAD WITHOUT CONTRAST TECHNIQUE: Contiguous axial images were obtained from the base of the skull through the vertex without intravenous contrast. COMPARISON:  02/26/2018 FINDINGS: Brain: No evidence of acute infarction, hemorrhage, hydrocephalus, extra-axial collection or mass lesion/mass effect. Moderate brain parenchymal volume loss and moderate deep white matter microangiopathy. Chronic enlarged sulcus along the left posterior vertex. Vascular: Calcific atherosclerotic disease of the intracavernous carotid arteries. Skull: Normal. Negative for fracture or focal lesion. Sinuses/Orbits: No acute finding. Other: None. IMPRESSION: No acute intracranial abnormality. Moderate brain parenchymal atrophy and chronic microvascular disease. Electronically Signed   By: Dobrinka  Dimitrova M.D.   On: 08/01/2018 20:15   Dg Knee Left Port  Result Date: 08/02/2018 CLINICAL DATA:  Recent fall with left knee pain, initial encounter EXAM: PORTABLE LEFT  KNEE - 2 VIEW COMPARISON:  None. FINDINGS: No acute fracture or dislocation is noted. No joint effusion is seen. No gross soft tissue abnormality is noted. Mild vascular calcifications are seen. IMPRESSION: No acute abnormality noted. Electronically Signed   By: Mark  Lukens M.D.   On: 08/02/2018 14:09   Dg Hip Unilat With Pelvis 2-3 Views Left  Result Date: 08/01/2018 CLINICAL DATA:  Fall with hip pain EXAM: DG HIP (WITH OR WITHOUT PELVIS) 2-3V LEFT COMPARISON:  02/26/2018 FINDINGS: Bones appear diffusely demineralized. SI joints are non widened. Pubic symphysis and rami appear intact. Normal alignment of right hip. Acute mildly comminuted and displaced left intertrochanteric fracture. Left femoral head demonstrates normal positioning. IMPRESSION: Acute mildly comminuted and displaced left intertrochanteric fracture. Electronically Signed   By: Kim  Fujinaga M.D.   On: 08/01/2018 18:28    Review of Systems  Constitutional: Negative for chills and fever.  Cardiovascular: Negative for chest pain.  Gastrointestinal: Negative for abdominal pain, nausea and vomiting.  Genitourinary: Negative for dysuria.  Neurological: Negative for tingling and sensory change.   Blood pressure (!) 138/59, pulse 98, temperature (!) 97.5 F (36.4 C), temperature source Oral, resp. rate 20, height 5' (1.524 m), weight 45.4 kg, SpO2 100 %. Physical Exam  Constitutional: She is cooperative. No distress.  Very pleasant white female. Slender build Moderate Sarcopenia  Quite talkative and engaging  Cardiovascular:  Regular but distant sounds   Pulmonary/Chest: No accessory muscle usage. No respiratory distress.  Diminished sounds throughout No rales or rhonchi noted  No Wheezes   Abdominal:  Soft, nontender, + bowel sounds No guarding  Genitourinary:  Genitourinary Comments: Pure wick in place Urine is clear in the wall suction canister  Musculoskeletal:  Pelvis      no traumatic wounds or rash, no  ecchymosis, stable to manual stress, nontender  Left Lower Extremity  Inspection: Left hip/thigh is resting in a shortening, flexed and abducted position Moderate swelling to the left proximal thigh is noted No open wounds or lesions noted.  No traumatic wounds appreciated No other gross deformities to the left lower extremity noted  Bony eval: Left hip is exquisitely tender with palpation.  Did not manipulate left   hip Knee is not tender, distal femur and patella nontender.  Proximal tibia nontender Tibial shaft, ankle and foot are nontender  Soft tissue: No open wounds or traumatic lesions appreciated Mild to moderate swelling to the proximal thigh Thigh is soft and compressible No significant ecchymosis appreciated at this time No knee or ankle effusion is noted Did not evaluate knee stability Ankle is stable with evaluation  Sensation: DPN, SPN, TN sensory function are intact  Motor: EHL, FHL, anterior tibialis, posterior tibialis, peroneals and gastrocsoleus complex motor functions are grossly intact  Vascular: Extremity is warm + DP pulses are noted Compartments are soft and nontender, no pain with passive stretching of her lower leg compartments  Right lower extremity             no open wounds or lesions, no swelling or ecchymosis   Nontender hip, knee, ankle and foot             No crepitus or gross motion noted with manipulation of the right leg  No knee or ankle effusion             No pain with axial loading or logrolling of the hip.  Knee stable to varus/ valgus and anterior/posterior stress             No pain with manipulation of the ankle or foot             No blocks to motion noted  Sens DPN, SPN, TN intact  Motor EHL, FHL, lesser toe motor, Ext, flex, evers 5/5  DP 2+, PT 2+, No significant edema             Compartments are soft and nontender, no pain with passive stretching  B upper extremities    shoulder, elbow, wrist, digits- no acute skin  wounds, nontender, no instability, no blocks to motion             + senile purpura   Sens  Ax/R/M/U intact  Mot   Ax/ R/ PIN/ M/ AIN/ U intact  Rad 2+     Neurological: She is alert.  Skin: Skin is warm and intact.  Psychiatric: She has a normal mood and affect. Her speech is normal.     Assessment/Plan:  77-year-old white female ground-level fall with acute left intertrochanteric hip fracture  -Ground-level fall  -Left four-part intertrochanteric hip fracture  OR tomorrow for intramedullary nailing  Bedrest for now    Weight-bear as tolerated postoperatively.  No range of motion restrictions postoperatively  PT and OT evaluations postop   Ice as needed for swelling and pain control   Patient was on a walker preoperatively and will require the use of a walker postoperatively.  She may use a grade of ambulation in the be dependent on wheelchair for mobilization in the community.   Patient has a remote bone density scan from 2016 which even at that time showed osteoporosis at her hip.  Patient will need follow-up bone density scan in the outpatient setting.  We will check some metabolic bone labs to identify any correctable parameters.  We will start her on calcium, vitamin D and continue her vitamin C.  Patient will likely benefit from pharmacologic treatment of her osteoporosis as well and would likely consider the use of Tymlos at this time given her acute hip fracture.  We can discuss this with the patient and her family.  They may choose to use a bisphosphonate is been a really long time   since she is been on any medication and we could consider her medication nave at this point.  If they choose to use bisphosphonate we can start these when she has achieved complete union of her hip fracture.  I do not want to use them in the acute phase due to their mechanism of action.  We could consider initiation of Tymlos during the acute healing phase.   Would anticipate need for skilled  nursing facility at the time of discharge.  It sounds as if her home environment would not be conducive to favorable outcome.  I am concerned that if she goes home even despite good family support she may continue functional decline if her nutritional status is not monitored and she is not engaged in physical exercise on a daily basis.   RD consult to assist with nutrition optimization   - Pain management:  Titrate accordingly  Minimize use of narcotics as much as possible - ABL anemia/Hemodynamics  Stable  CBC in the morning  Type and screen, coags  - Medical issues   Per medical service  - DVT/PE prophylaxis:  Recommend Lovenox postoperatively x30 days - ID:   Perioperative antibiotics - Metabolic Bone Disease:  As above - Activity:  Bedrest for now  Weight-bear as tolerated postoperatively  - FEN/GI prophylaxis/Foley/Lines:  Regular diet for now  Pure wick, do not use pure wick after fixation has been performed.  Would anticipate Foley catheter until postop day 1 and then discontinuing  - Impediments to fracture healing:  Chronic medical issues  Restricted mobility/weightbearing activity  Osteoporosis  - Dispo:  OR tomorrow for IM nail left hip  Social work consult  RD consult    W. , PA-C [phone] (C) 08/02/2018, 5:13 PM  [facility] [address]  [place] [address] [phone] (O) [phone] (F)   

## 2018-08-02 NOTE — Progress Notes (Signed)
Initial Nutrition Assessment  DOCUMENTATION CODES:   Non-severe (moderate) malnutrition in context of chronic illness  INTERVENTION:  Ensure Enlive BID, each supplement provides 350 kcal and 20 g protein  MVI Magic Cup Lunch and Dinner, each supplement provides 290 kcal and 9 g protein.    NUTRITION DIAGNOSIS:   Moderate Malnutrition related to chronic illness(COPD, CHF) as evidenced by moderate muscle depletion, moderate fat depletion.   GOAL:   Patient will meet greater than or equal to 90% of their needs   MONITOR:   PO intake, Supplement acceptance, Diet advancement  REASON FOR ASSESSMENT:   Consult Hip fracture protocol  ASSESSMENT:   Pt with PMH of CHF, COPD, dyspnea, HLD, HTN, hypothyroidism and diastolic dysfunction. Admitted due to fall at home with left hip fracture.   Ortho Surgery Consult pending.   Pt alert and oriented in room. Son at bedside helping answer questions. Pt normally eats some scrambled eggs and toast for breakfast. Usually soup for lunch. Some mixed nuts in the afternoon and whatever family members bring her for dinner son states she eats anything given to her. Son works for [employer] and keeps Ensures in the fridge for her but she doesn't normally drink them but states that she likes them.   Pt reports that she has a little trouble with swallowing sometimes.   Per son, pt UBW is around 110# and that she went down to around 85# in June 2019 while in the hospital and has regained weight back up to ~92-93#. Not consistent with weight trends in chart. 4.4# weight loss since 6/24.   Medications Reviewed and Include: Senokot 1 tablet BID  Labs Reviewed:  Potassium 2.6 (L)  Glucose 158 (H) Creatinine 1.15 (H)  Chloride 91 (L) Calcium 8.7 (L)   NUTRITION - FOCUSED PHYSICAL EXAM:    Most Recent Value  Orbital Region  Mild depletion  Upper Arm Region  Severe depletion  Thoracic and Lumbar Region  Moderate depletion  Buccal Region  Moderate  depletion  Temple Region  Moderate depletion  Clavicle Bone Region  Mild depletion  Clavicle and Acromion Bone Region  Moderate depletion  Scapular Bone Region  Mild depletion  Dorsal Hand  Moderate depletion  Patellar Region  Severe depletion  Anterior Thigh Region  Severe depletion  Posterior Calf Region  Severe depletion  Edema (RD Assessment)  None  Hair  Reviewed [recent hair loss stated]  Eyes  Reviewed  Mouth  Reviewed  Skin  Reviewed  Nails  Reviewed       Diet Order:   Diet Order            Diet NPO time specified  Diet effective midnight              EDUCATION NEEDS:   No education needs have been identified at this time  Skin:  Skin Assessment: Reviewed RN Assessment  Last BM:  11/25  Height:   Ht Readings from Last 1 Encounters:  08/01/18 5' (1.524 m)    Weight:   Wt Readings from Last 1 Encounters:  08/01/18 45.4 kg    Ideal Body Weight:  45.45 kg  BMI:  Body mass index is 19.53 kg/m.  Estimated Nutritional Needs:   Kcal:  1300-1500  Protein:  65-75 g  Fluid:  > 1.5 L     Mauricia Area, MS, Dietetic Intern Pager: [phone] After hours Pager: [phone]

## 2018-08-02 NOTE — Anesthesia Preprocedure Evaluation (Addendum)
Anesthesia Evaluation  Patient identified by MRN, date of birth, ID band Patient awake    Reviewed: Allergy & Precautions, NPO status , Patient's Chart, lab work & pertinent test results  History of Anesthesia Complications Negative for: history of anesthetic complications  Airway Mallampati: II  TM Distance: >3 FB Neck ROM: Full    Dental  (+) Dental Advisory Given, Chipped   Pulmonary shortness of breath and at rest, COPD (3L continuous),  COPD inhaler and oxygen dependent, former smoker,    breath sounds clear to auscultation       Cardiovascular hypertension, Pt. on medications  Rhythm:Regular Rate:Normal     Neuro/Psych PSYCHIATRIC DISORDERS Depression negative neurological ROS     GI/Hepatic negative GI ROS, Neg liver ROS,   Endo/Other  Hypothyroidism   Renal/GU Renal InsufficiencyRenal disease     Musculoskeletal negative musculoskeletal ROS (+)   Abdominal   Peds  Hematology  (+) anemia ,   Anesthesia Other Findings   Reproductive/Obstetrics                           Anesthesia Physical Anesthesia Plan  ASA: III  Anesthesia Plan: Spinal   Post-op Pain Management:    Induction:   PONV Risk Score and Plan: 3 and Ondansetron, Treatment may vary due to age or medical condition and Propofol infusion  Airway Management Planned: Natural Airway and Simple Face Mask  Additional Equipment: None  Intra-op Plan:   Post-operative Plan:   Informed Consent: I have reviewed the patients History and Physical, chart, labs and discussed the procedure including the risks, benefits and alternatives for the proposed anesthesia with the patient or authorized representative who has indicated his/her understanding and acceptance.     Plan Discussed with: Anesthesiologist and CRNA  Anesthesia Plan Comments: (Labs reviewed, platelets acceptable. Discussed risks and benefits of spinal,  including spinal/epidural hematoma, infection, failed block, and PDPH. Patient expressed understanding and wished to proceed. )       Anesthesia Quick Evaluation

## 2018-08-02 NOTE — Progress Notes (Signed)
PROGRESS NOTE    [name]  OHY:[id number] DOB: 09-16-1940 DOA: 08/01/2018 PCP: [name], MD    Brief Narrative:  77 y.o. female with medical history significant of CHF, COPD, diastolic dysfunction, dyspnea, tobacco abuse, hyperlipidemia, hypertension, hypothyroidism, depression, history of pneumonia who is coming to the emergency department with complaints of having a mechanical fall at home.  She denies dizziness, palpitations, chest pain, dyspnea, diaphoresis, blurred vision, headache or any other prodromal symptoms before falling.  She states that this was an accident.  She denies fever, chills, sore throat, wheezing, hemoptysis, PND, orthopnea or recent pitting edema of the lower extremities.  No abdominal pain, nausea, emesis, diarrhea, constipation, melena or hematochezia.  Denies dysuria, frequency or hematuria.  No polyuria, polydipsia, polyphagia or blurred vision.  Denies heat or cold intolerance.  Denies pruritus or skin rashes.  ED Course: Initial vital signs temperature 98.1 F, pulse 92, respirations 18, blood pressure 176/89 mmHg and O2 sat 97% on room air.  Analgesics, potassium and magnesium supplementation in the emergency department.  Orthopedic surgery on call was contacted and requested transfer to [facility] for further evaluation and treatment.  Her urinalysis shows a hazy appearance, with moderate leukocyte esterase, more than 50 WBCs per hpf and rare bacteria.  White counts 14.3, hemoglobin 11.0 g/dL and platelets 298.  PT and INR were normal.  Troponin was normal.  LFTs were within normal limits.  Sodium was 138, potassium 2.6, chloride 91 and CO2 35 mmol/L.  BUN 16, glucose 158, creatinine 1.15, calcium 9.7, magnesium 1.9 and phosphorus 3.6 mg/dL.  Assessment & Plan:   Principal Problem:   Closed left hip fracture, initial encounter [facility]) Active Problems:   Hypertension   Hyperlipidemia   Hypothyroidism   Depression   Diastolic dysfunction   Hypokalemia  Malnutrition of moderate degree  Principal Problem:   Closed left hip fracture, initial encounter ([place]) -Continue with analgesics as needed. -Hip fracture protocol orders. -Orthopedic surgery consulted, plan for OR tomorrow -Lungs clear, CXR and ekg reviewed - unremarkable, on chronic O2. At this time, benefits to surgery would outweigh perioperative medical risk  Active Problems:   Hypertension -Continue diltiazem 120 mg p.o. daily. -Hold ingtorsemide. -BP stable at this time.    Hyperlipidemia -Continue pravastatin 40 mg p.o. daily. -Stable at this time    Hypothyroidism -TSH noted to be 1.562 microunits/L. -Continue levothyroxine 50 mcg p.o. daily.    Depression -Continue Wellbutrin XL 150 mg p.o. Daily. -Appears stable at present    Diastolic dysfunction -No signs of decompensation noted -Torsemide on hold with anticipation to resume in 2 to 3 days.    Hypokalemia -Replaced -Magnesium has been supplemented. -Repeat bmet in AM  COPD with chronic hypoxemic respiratory failure -On baseline 2LNC -Patient reports intolerance with albuterol nebs in past, much better response with xopenex PRN, have ordered -No wheezing at this time   DVT prophylaxis: Heparin subQ Code Status: Full Family Communication: Pt in room, family at bedside Disposition Plan: Uncertain at this time  Consultants:   Orthopedic Surgeon  Procedures:     Antimicrobials: Anti-infectives (From admission, onward)   Start     Dose/Rate Route Frequency Ordered Stop   08/03/18 0600  ceFAZolin (ANCEF) IVPB 2g/100 mL premix     2 g 200 mL/hr over 30 Minutes Intravenous On call to O.R. 08/02/18 1750 08/04/18 0559       Subjective: Without complaints at this time  Objective: Vitals:   08/01/18 2137 08/01/18 2329 08/02/18 0407 08/02/18  1226  BP: (!) 145/77 (!) 158/63 (!) 145/54 (!) 138/59  Pulse:  (!) 101 96 98  Resp: 17 18 14 20   Temp:  97.8 F (36.6 C) 98.3 F (36.8 C) (!) 97.5  F (36.4 C)  TempSrc:  Oral Oral Oral  SpO2:  99% 96% 100%  Weight:      Height:        Intake/Output Summary (Last 24 hours) at 08/02/2018 1842 Last data filed at 08/02/2018 0442 Gross per 24 hour  Intake 59.29 ml  Output 150 ml  Net -90.71 ml   Filed Weights   08/01/18 1728  Weight: 45.4 kg    Examination:  General exam: Appears calm and comfortable  Respiratory system: Clear to auscultation. Respiratory effort normal. Cardiovascular system: S1 & S2 heard, RRR Gastrointestinal system: Abdomen is nondistended, soft and nontender. No organomegaly or masses felt. Normal bowel sounds heard. Central nervous system: Alert and oriented. No focal neurological deficits. Extremities: Symmetric 5 x 5 power. Skin: No rashes, lesions  Psychiatry: Judgement and insight appear normal. Mood & affect appropriate.   Data Reviewed: I have personally reviewed following labs and imaging studies  CBC: Recent Labs  Lab 08/01/18 1949  WBC 14.3*  NEUTROABS 12.0*  HGB 11.0*  HCT 34.9*  MCV 89.0  PLT 300   Basic Metabolic Panel: Recent Labs  Lab 08/01/18 1949 08/02/18 0842  NA 138 142  K 2.6* 3.7  CL 91* 99  CO2 35* 36*  GLUCOSE 158* 113*  BUN 16 14  CREATININE 1.15* 1.23*  CALCIUM 8.7* 8.8*  MG 1.9  --   PHOS 3.6  --    GFR: Estimated Creatinine Clearance: 27.5 mL/min (A) (by C-G formula based on SCr of 1.23 mg/dL (H)). Liver Function Tests: Recent Labs  Lab 08/01/18 1949  AST 16  ALT 13  ALKPHOS 77  BILITOT 0.8  PROT 6.8  ALBUMIN 3.9   No results for input(s): LIPASE, AMYLASE in the last 168 hours. No results for input(s): AMMONIA in the last 168 hours. Coagulation Profile: Recent Labs  Lab 08/01/18 1949  INR 0.92   Cardiac Enzymes: Recent Labs  Lab 08/01/18 1949  TROPONINI <0.03   BNP (last 3 results) No results for input(s): PROBNP in the last 8760 hours. HbA1C: No results for input(s): HGBA1C in the last 72 hours. CBG: No results for input(s):  GLUCAP in the last 168 hours. Lipid Profile: No results for input(s): CHOL, HDL, LDLCALC, TRIG, CHOLHDL, LDLDIRECT in the last 72 hours. Thyroid Function Tests: Recent Labs    08/02/18 0124  TSH 1.562   Anemia Panel: No results for input(s): VITAMINB12, FOLATE, FERRITIN, TIBC, IRON, RETICCTPCT in the last 72 hours. Sepsis Labs: No results for input(s): PROCALCITON, LATICACIDVEN in the last 168 hours.  Recent Results (from the past 240 hour(s))  MRSA PCR Screening     Status: None   Collection Time: 08/02/18  1:56 AM  Result Value Ref Range Status   MRSA by PCR NEGATIVE NEGATIVE Final    Comment:        The GeneXpert MRSA Assay (FDA approved for NASAL specimens only), is one component of a comprehensive MRSA colonization surveillance program. It is not intended to diagnose MRSA infection nor to guide or monitor treatment for MRSA infections. Performed at [facility], [place] [address] [place], [place] [address]      Radiology Studies: Dg Chest 1 View  Result Date: 08/01/2018 CLINICAL DATA:  Fall EXAM: CHEST  1 VIEW  COMPARISON:  12/27/2015, CT 07/19/2017 FINDINGS: Hyperinflation with emphysematous disease. No acute opacity or pleural effusion. Normal heart size with aortic atherosclerosis. No pneumothorax. IMPRESSION: No active disease.  Hyperinflation with emphysematous disease Electronically Signed   By: Donavan Foil M.D.   On: 08/01/2018 18:29   Ct Head Wo Contrast  Result Date: 08/01/2018 CLINICAL DATA:  Status post fall. EXAM: CT HEAD WITHOUT CONTRAST TECHNIQUE: Contiguous axial images were obtained from the base of the skull through the vertex without intravenous contrast. COMPARISON:  02/26/2018 FINDINGS: Brain: No evidence of acute infarction, hemorrhage, hydrocephalus, extra-axial collection or mass lesion/mass effect. Moderate brain parenchymal volume loss and moderate deep white matter microangiopathy. Chronic enlarged sulcus along the left posterior vertex.  Vascular: Calcific atherosclerotic disease of the intracavernous carotid arteries. Skull: Normal. Negative for fracture or focal lesion. Sinuses/Orbits: No acute finding. Other: None. IMPRESSION: No acute intracranial abnormality. Moderate brain parenchymal atrophy and chronic microvascular disease. Electronically Signed   By: Fidela Salisbury M.D.   On: 08/01/2018 20:15   Dg Knee Left Port  Result Date: 08/02/2018 CLINICAL DATA:  Recent fall with left knee pain, initial encounter EXAM: PORTABLE LEFT KNEE - 2 VIEW COMPARISON:  None. FINDINGS: No acute fracture or dislocation is noted. No joint effusion is seen. No gross soft tissue abnormality is noted. Mild vascular calcifications are seen. IMPRESSION: No acute abnormality noted. Electronically Signed   By: Inez Catalina M.D.   On: 08/02/2018 14:09   Dg Hip Unilat With Pelvis 2-3 Views Left  Result Date: 08/01/2018 CLINICAL DATA:  Fall with hip pain EXAM: DG HIP (WITH OR WITHOUT PELVIS) 2-3V LEFT COMPARISON:  02/26/2018 FINDINGS: Bones appear diffusely demineralized. SI joints are non widened. Pubic symphysis and rami appear intact. Normal alignment of right hip. Acute mildly comminuted and displaced left intertrochanteric fracture. Left femoral head demonstrates normal positioning. IMPRESSION: Acute mildly comminuted and displaced left intertrochanteric fracture. Electronically Signed   By: Donavan Foil M.D.   On: 08/01/2018 18:28    Scheduled Meds: . buPROPion  150 mg Oral Daily  . diltiazem  120 mg Oral Daily  . feeding supplement (ENSURE ENLIVE)  237 mL Oral BID BM  . heparin  5,000 Units Subcutaneous Q8H  . levothyroxine  50 mcg Oral QAC breakfast  . multivitamin with minerals  1 tablet Oral Daily  . pantoprazole  40 mg Oral QAC breakfast  . pravastatin  40 mg Oral q morning - 10a  . senna-docusate  1 tablet Oral BID  . umeclidinium bromide  1 puff Inhalation Daily   Continuous Infusions: . 0.9 % NaCl with KCl 40 mEq / L 75 mL/hr  (08/02/18 1552)  . [START ON 08/03/2018]  ceFAZolin (ANCEF) IV       LOS: 1 day   Marylu Lund, MD [facility] Pager On Amion  If 7PM-7AM, please contact night-coverage 08/02/2018, 6:42 PM

## 2018-08-03 ENCOUNTER — Inpatient Hospital Stay (HOSPITAL_COMMUNITY): Payer: Medicare Other

## 2018-08-03 ENCOUNTER — Inpatient Hospital Stay (HOSPITAL_COMMUNITY): Payer: Medicare Other | Admitting: Anesthesiology

## 2018-08-03 ENCOUNTER — Encounter (HOSPITAL_COMMUNITY)
Admission: EM | Disposition: A | Discharge status: Skilled Nursing Facility | Payer: Self-pay | Source: Home / Self Care | Attending: Family Medicine

## 2018-08-03 ENCOUNTER — Encounter (HOSPITAL_COMMUNITY): Payer: Self-pay | Admitting: Anesthesiology

## 2018-08-03 DIAGNOSIS — J449 Chronic obstructive pulmonary disease, unspecified: Secondary | ICD-10-CM

## 2018-08-03 DIAGNOSIS — E440 Moderate protein-calorie malnutrition: Secondary | ICD-10-CM

## 2018-08-03 DIAGNOSIS — J9611 Chronic respiratory failure with hypoxia: Secondary | ICD-10-CM

## 2018-08-03 DIAGNOSIS — E039 Hypothyroidism, unspecified: Secondary | ICD-10-CM

## 2018-08-03 HISTORY — PX: INTRAMEDULLARY (IM) NAIL INTERTROCHANTERIC: SHX5875

## 2018-08-03 LAB — CBC
HCT: 29.4 % — ABNORMAL LOW (ref 36.0–46.0)
HEMATOCRIT: 29.7 % — AB (ref 36.0–46.0)
HEMOGLOBIN: 8.8 g/dL — AB (ref 12.0–15.0)
Hemoglobin: 8.9 g/dL — ABNORMAL LOW (ref 12.0–15.0)
MCH: 27.4 pg (ref 26.0–34.0)
MCH: 28 pg (ref 26.0–34.0)
MCHC: 29.6 g/dL — ABNORMAL LOW (ref 30.0–36.0)
MCHC: 30.3 g/dL (ref 30.0–36.0)
MCV: 92.5 fL (ref 80.0–100.0)
MCV: 92.5 fL (ref 80.0–100.0)
NRBC: 0 % (ref 0.0–0.2)
NRBC: 0 % (ref 0.0–0.2)
PLATELETS: 236 10*3/uL (ref 150–400)
Platelets: 239 10*3/uL (ref 150–400)
RBC: 3.21 MIL/uL — ABNORMAL LOW (ref 3.87–5.11)
RBC: 3.18 MIL/uL — AB (ref 3.87–5.11)
RDW: 12.7 % (ref 11.5–15.5)
RDW: 12.7 % (ref 11.5–15.5)
WBC: 7.4 10*3/uL (ref 4.0–10.5)
WBC: 11 10*3/uL — ABNORMAL HIGH (ref 4.0–10.5)

## 2018-08-03 LAB — COMPREHENSIVE METABOLIC PANEL
ALK PHOS: 60 U/L (ref 38–126)
ALT: 12 U/L (ref 0–44)
AST: 13 U/L — AB (ref 15–41)
Albumin: 2.8 g/dL — ABNORMAL LOW (ref 3.5–5.0)
Anion gap: 3 — ABNORMAL LOW (ref 5–15)
BILIRUBIN TOTAL: 0.5 mg/dL (ref 0.3–1.2)
BUN: 13 mg/dL (ref 8–23)
CALCIUM: 8.6 mg/dL — AB (ref 8.9–10.3)
CO2: 30 mmol/L (ref 22–32)
CREATININE: 0.93 mg/dL (ref 0.44–1.00)
Chloride: 110 mmol/L (ref 98–111)
GFR, EST NON AFRICAN AMERICAN: 59 mL/min — AB (ref >60)
Glucose, Bld: 100 mg/dL — ABNORMAL HIGH (ref 70–99)
Potassium: 4.8 mmol/L (ref 3.5–5.1)
Sodium: 143 mmol/L (ref 135–145)
Total Protein: 5.1 g/dL — ABNORMAL LOW (ref 6.5–8.1)

## 2018-08-03 LAB — PROTIME-INR
INR: 0.97
Prothrombin Time: 12.8 seconds (ref 11.4–15.2)

## 2018-08-03 SURGERY — FIXATION, FRACTURE, INTERTROCHANTERIC, WITH INTRAMEDULLARY ROD
Anesthesia: Spinal | Laterality: Left

## 2018-08-03 MED ORDER — DEXAMETHASONE SODIUM PHOSPHATE 10 MG/ML IJ SOLN
INTRAMUSCULAR | Status: AC
  Filled 2018-08-03: qty 1

## 2018-08-03 MED ORDER — MIDAZOLAM HCL 2 MG/2ML IJ SOLN
INTRAMUSCULAR | Status: AC
  Filled 2018-08-03: qty 2

## 2018-08-03 MED ORDER — ENOXAPARIN SODIUM 30 MG/0.3ML ~~LOC~~ SOLN: 30.0000 mg | SUBCUTANEOUS | Status: DC

## 2018-08-03 MED ORDER — LIDOCAINE 2% (20 MG/ML) 5 ML SYRINGE
INTRAMUSCULAR | Status: AC
  Filled 2018-08-03: qty 5

## 2018-08-03 MED ORDER — ONDANSETRON HCL 4 MG/2ML IJ SOLN
INTRAMUSCULAR | Status: AC
  Filled 2018-08-03: qty 2

## 2018-08-03 MED ORDER — ENOXAPARIN SODIUM 30 MG/0.3ML ~~LOC~~ SOLN
30.0000 mg | Freq: Once | SUBCUTANEOUS | Status: AC
  Administered 2018-08-04: 30 mg via SUBCUTANEOUS
  Filled 2018-08-03: qty 0.3

## 2018-08-03 MED ORDER — PROPOFOL 10 MG/ML IV BOLUS
INTRAVENOUS | Status: DC | PRN
  Administered 2018-08-03: 10 mg via INTRAVENOUS
  Administered 2018-08-03 (×2): 5 mg via INTRAVENOUS
  Administered 2018-08-03: 10 mg via INTRAVENOUS
  Administered 2018-08-03: 15 mg via INTRAVENOUS

## 2018-08-03 MED ORDER — FENTANYL CITRATE (PF) 250 MCG/5ML IJ SOLN
INTRAMUSCULAR | Status: AC
  Filled 2018-08-03: qty 5

## 2018-08-03 MED ORDER — BUPIVACAINE HCL (PF) 0.5 % IJ SOLN
INTRAMUSCULAR | Status: AC
  Filled 2018-08-03: qty 10

## 2018-08-03 MED ORDER — PROPOFOL 500 MG/50ML IV EMUL
INTRAVENOUS | Status: DC | PRN
  Administered 2018-08-03: 25 ug/kg/min via INTRAVENOUS

## 2018-08-03 MED ORDER — CEFAZOLIN SODIUM-DEXTROSE 1-4 GM/50ML-% IV SOLN
1.0000 g | Freq: Three times a day (TID) | INTRAVENOUS | Status: AC
  Administered 2018-08-03 – 2018-08-04 (×3): 1 g via INTRAVENOUS
  Filled 2018-08-03 (×3): qty 50

## 2018-08-03 MED ORDER — FENTANYL CITRATE (PF) 100 MCG/2ML IJ SOLN
INTRAMUSCULAR | Status: DC | PRN
  Administered 2018-08-03: 25 ug via INTRAVENOUS

## 2018-08-03 MED ORDER — ENOXAPARIN SODIUM 30 MG/0.3ML ~~LOC~~ SOLN
30.0000 mg | Freq: Every day | SUBCUTANEOUS | Status: DC
  Administered 2018-08-04 – 2018-08-07 (×4): 30 mg via SUBCUTANEOUS
  Filled 2018-08-03 (×4): qty 0.3

## 2018-08-03 MED ORDER — BUPIVACAINE HCL (PF) 0.5 % IJ SOLN
INTRAMUSCULAR | Status: DC | PRN
  Administered 2018-08-03: 2.2 mL via INTRATHECAL

## 2018-08-03 MED ORDER — FENTANYL CITRATE (PF) 100 MCG/2ML IJ SOLN: 25.0000 ug | INTRAMUSCULAR | Status: DC | PRN

## 2018-08-03 MED ORDER — SODIUM CHLORIDE 0.9 % IV SOLN
INTRAVENOUS | Status: DC | PRN
  Administered 2018-08-03: 25 ug/min via INTRAVENOUS

## 2018-08-03 MED ORDER — ONDANSETRON HCL 4 MG/2ML IJ SOLN
INTRAMUSCULAR | Status: DC | PRN
  Administered 2018-08-03: 4 mg via INTRAVENOUS

## 2018-08-03 MED ORDER — 0.9 % SODIUM CHLORIDE (POUR BTL) OPTIME
TOPICAL | Status: DC | PRN
  Administered 2018-08-03: 1000 mL

## 2018-08-03 MED ORDER — ONDANSETRON HCL 4 MG/2ML IJ SOLN: 4.0000 mg | Freq: Once | INTRAMUSCULAR | Status: DC | PRN

## 2018-08-03 MED ORDER — OXYCODONE HCL 5 MG PO TABS: 5.0000 mg | ORAL_TABLET | Freq: Once | ORAL | Status: DC | PRN

## 2018-08-03 MED ORDER — OXYCODONE HCL 5 MG/5ML PO SOLN: 5.0000 mg | Freq: Once | ORAL | Status: DC | PRN

## 2018-08-03 MED ORDER — PROPOFOL 10 MG/ML IV BOLUS
INTRAVENOUS | Status: AC
  Filled 2018-08-03: qty 20

## 2018-08-03 MED ORDER — LACTATED RINGERS IV SOLN
INTRAVENOUS | Status: DC | PRN
  Administered 2018-08-03: 07:00:00 via INTRAVENOUS

## 2018-08-03 MED ORDER — ACETAMINOPHEN 325 MG PO TABS
650.0000 mg | ORAL_TABLET | Freq: Three times a day (TID) | ORAL | Status: DC
  Administered 2018-08-03 – 2018-08-04 (×2): 650 mg via ORAL
  Filled 2018-08-03 (×2): qty 2

## 2018-08-03 SURGICAL SUPPLY — 54 items
BIT DRILL 4.3MMS DISTAL GRDTED (BIT) IMPLANT
BNDG COHESIVE 6X5 TAN STRL LF (GAUZE/BANDAGES/DRESSINGS) IMPLANT
BRUSH SCRUB SURG 4.25 DISP (MISCELLANEOUS) ×6 IMPLANT
COVER PERINEAL POST (MISCELLANEOUS) ×3 IMPLANT
COVER SURGICAL LIGHT HANDLE (MISCELLANEOUS) ×6 IMPLANT
COVER WAND RF STERILE (DRAPES) ×3 IMPLANT
DRAPE C-ARMOR (DRAPES) ×3 IMPLANT
DRAPE HALF SHEET 40X57 (DRAPES) IMPLANT
DRAPE ORTHO SPLIT 77X108 STRL (DRAPES)
DRAPE STERI IOBAN 125X83 (DRAPES) ×3 IMPLANT
DRAPE SURG ORHT 6 SPLT 77X108 (DRAPES) IMPLANT
DRAPE U-SHAPE 47X51 STRL (DRAPES) ×3 IMPLANT
DRILL 4.3MMS DISTAL GRADUATED (BIT) ×3
DRSG EMULSION OIL 3X3 NADH (GAUZE/BANDAGES/DRESSINGS) ×3 IMPLANT
DRSG MEPILEX BORDER 4X4 (GAUZE/BANDAGES/DRESSINGS) ×3 IMPLANT
DRSG MEPILEX BORDER 4X8 (GAUZE/BANDAGES/DRESSINGS) ×3 IMPLANT
ELECT REM PT RETURN 9FT ADLT (ELECTROSURGICAL) ×3
ELECTRODE REM PT RTRN 9FT ADLT (ELECTROSURGICAL) ×1 IMPLANT
GLOVE BIO SURGEON STRL SZ7.5 (GLOVE) ×3 IMPLANT
GLOVE BIO SURGEON STRL SZ8 (GLOVE) ×3 IMPLANT
GLOVE BIOGEL PI IND STRL 7.5 (GLOVE) ×1 IMPLANT
GLOVE BIOGEL PI IND STRL 8 (GLOVE) ×1 IMPLANT
GLOVE BIOGEL PI INDICATOR 7.5 (GLOVE) ×2
GLOVE BIOGEL PI INDICATOR 8 (GLOVE) ×2
GOWN STRL REUS W/ TWL LRG LVL3 (GOWN DISPOSABLE) ×2 IMPLANT
GOWN STRL REUS W/ TWL XL LVL3 (GOWN DISPOSABLE) ×1 IMPLANT
GOWN STRL REUS W/TWL LRG LVL3 (GOWN DISPOSABLE) ×6
GOWN STRL REUS W/TWL XL LVL3 (GOWN DISPOSABLE) ×3
GUIDEPIN 3.2X17.5 THRD DISP (PIN) ×2 IMPLANT
GUIDEWIRE BALL NOSE 100CM (WIRE) ×2 IMPLANT
HFN LAG SCREW 10.5MM X 85MM (Screw) ×2 IMPLANT
HIP FRA NAIL LAG SCREW 10.5X90 (Orthopedic Implant) ×3 IMPLANT
KIT BASIN OR (CUSTOM PROCEDURE TRAY) ×3 IMPLANT
KIT TURNOVER KIT B (KITS) ×3 IMPLANT
LINER BOOT UNIVERSAL DISP (MISCELLANEOUS) ×3 IMPLANT
MANIFOLD NEPTUNE II (INSTRUMENTS) ×3 IMPLANT
NS IRRIG 1000ML POUR BTL (IV SOLUTION) ×3 IMPLANT
PACK GENERAL/GYN (CUSTOM PROCEDURE TRAY) ×3 IMPLANT
PAD ARMBOARD 7.5X6 YLW CONV (MISCELLANEOUS) ×6 IMPLANT
SCREW BONE CORTICAL 5.0X38 (Screw) ×2 IMPLANT
SCREW LAG HIP FRA NAIL 10.5X90 (Orthopedic Implant) IMPLANT
SCREW LAG HIP NAIL 11X320 (Screw) ×2 IMPLANT
STAPLER VISISTAT 35W (STAPLE) ×1 IMPLANT
STOCKINETTE IMPERVIOUS LG (DRAPES) IMPLANT
SUT ETHILON 3 0 PS 1 (SUTURE) ×3 IMPLANT
SUT VIC AB 0 CT1 27 (SUTURE) ×3
SUT VIC AB 0 CT1 27XBRD ANBCTR (SUTURE) ×1 IMPLANT
SUT VIC AB 1 CT1 27 (SUTURE) ×3
SUT VIC AB 1 CT1 27XBRD ANBCTR (SUTURE) ×1 IMPLANT
SUT VIC AB 2-0 CT1 27 (SUTURE) ×3
SUT VIC AB 2-0 CT1 TAPERPNT 27 (SUTURE) ×1 IMPLANT
TOWEL OR 17X24 6PK STRL BLUE (TOWEL DISPOSABLE) ×3 IMPLANT
TOWEL OR 17X26 10 PK STRL BLUE (TOWEL DISPOSABLE) ×6 IMPLANT
WATER STERILE IRR 1000ML POUR (IV SOLUTION) ×3 IMPLANT

## 2018-08-03 NOTE — Progress Notes (Signed)
  PROGRESS NOTE  [name] JKK:[id number] DOB: 07/25/1941 DOA: 08/01/2018 PCP: [name], MD  Brief Narrative: 77 year old woman PMH diastolic CHF, COPD, hypothyroidism presented after mechanical fall at home resulting in left hip pain.  Imaging confirmed comminuted, displaced left intertrochanteric fracture.  Assessment/Plan Left intertrochanteric hip fracture --Status post surgery today.  Management per orthopedics.  Acute blood loss anemia secondary to fracture, surgery --Check CBC in a.m.  COPD, chronic hypoxic respiratory failure on home oxygen --Appears stable.  Chronic diastolic CHF --Appears stable. Continue diltiazem.  Hypothyroidism --Continue levothyroxine.  Essential hypertension --Stable.  Moderate malnutrition --Ensure, Magic cup, multivitamin per dietitian   DVT prophylaxis: enoxaparin Code Status: Full Family Communication:  Disposition Plan: pending PT evaluation    Murray Hodgkins, MD  [facility] Direct contact: [phone] --[place]ia [place]  --[email or web address]; password [email or web address]  7PM-7AM contact night coverage as above 08/03/2018, 2:07 PM  LOS: 2 days   Consultants:  Orthopedics   Procedures:  INTRAMEDULLARY (IM) NAIL INTERTROCHANTRIC (Left) with Biomet Affixus Nail, 54mm x 31mm  Antimicrobials:    Interval history/Subjective: Feels tired, breathing not at baseline secondary to feeling tired.  Objective: Vitals:  Vitals:   08/03/18 1230 08/03/18 1253  BP: (!) 152/88 (!) 153/71  Pulse:  (!) 125  Resp:  (!) 22  Temp:  98.6 F (37 C)  SpO2:  100%    Exam:  Constitutional:  . Appears calm and comfortable Respiratory:  . CTA bilaterally, no w/r/r.  Poor air movement. Marland Kitchen Respiratory effort normal Cardiovascular:  . RRR, no m/r/g . No LE extremity edema   Psychiatric:  . Mental status o Mood, affect appropriate  I have personally reviewed the following:   Data: . Basic metabolic panel unremarkable . LFTs  unremarkable . Hemoglobin 8.8, remainder CBC unremarkable  Scheduled Meds: . acetaminophen  650 mg Oral Q8H  . buPROPion  150 mg Oral Daily  . diltiazem  120 mg Oral Daily  . [START ON 08/04/2018] enoxaparin (LOVENOX) injection  30 mg Subcutaneous Once  . [START ON 08/04/2018] enoxaparin (LOVENOX) injection  30 mg Subcutaneous QHS  . feeding supplement (ENSURE ENLIVE)  237 mL Oral BID BM  . levothyroxine  50 mcg Oral QAC breakfast  . multivitamin with minerals  1 tablet Oral Daily  . pantoprazole  40 mg Oral QAC breakfast  . pravastatin  40 mg Oral q morning - 10a  . senna-docusate  1 tablet Oral BID  . umeclidinium bromide  1 puff Inhalation Daily   Continuous Infusions: .  ceFAZolin (ANCEF) IV      Principal Problem:   Closed left hip fracture, initial encounter ([place]) Active Problems:   COPD G[place] D, frequent exacerbations   Hypertension   Hyperlipidemia   Hypothyroidism   Chronic respiratory failure with hypoxia (HCC)   Diastolic dysfunction   Malnutrition of moderate degree   LOS: 2 days

## 2018-08-03 NOTE — Plan of Care (Signed)

## 2018-08-03 NOTE — Transfer of Care (Signed)
Immediate Anesthesia Transfer of Care Note  Patient: [name]  Procedure(s) Performed: INTRAMEDULLARY (IM) NAIL INTERTROCHANTRIC (Left )  Patient Location: PACU  Anesthesia Type:MAC and Spinal  Level of Consciousness: awake and patient cooperative  Airway & Oxygen Therapy: Patient Spontanous Breathing and Patient connected to nasal cannula oxygen  Post-op Assessment: Report given to RN and Post -op Vital signs reviewed and stable  Post vital signs: Reviewed  Last Vitals:  Vitals Value Taken Time  BP 163/88 08/03/2018 10:29 AM  Temp    Pulse 117 08/03/2018 10:33 AM  Resp 27 08/03/2018 10:33 AM  SpO2 100 % 08/03/2018 10:33 AM  Vitals shown include unvalidated device data.  Last Pain:  Vitals:   08/03/18 0531  TempSrc: Oral  PainSc:          Complications: No apparent anesthesia complications

## 2018-08-03 NOTE — Progress Notes (Signed)
Pt c/o "it' feels hard to breathe". O2 sats 97-100% on 3L , ST 116, denies pain, she is anxious. Dr Fransisco Beau fully updated, no new orders. Will continue to monitor closely. Awaiting family who are speaking with Dr Marcelino Scot.

## 2018-08-03 NOTE — Anesthesia Procedure Notes (Signed)
Spinal  Patient location during procedure: OR Start time: 08/03/2018 8:28 AM End time: 08/03/2018 8:38 AM Staffing Anesthesiologist: Audry Pili, MD Performed: anesthesiologist  Preanesthetic Checklist Completed: patient identified, surgical consent, pre-op evaluation, timeout performed, IV checked, risks and benefits discussed and monitors and equipment checked Spinal Block Patient position: left lateral decubitus Prep: DuraPrep Patient monitoring: heart rate, cardiac monitor, continuous pulse ox and blood pressure Approach: midline Location: L3-4 Injection technique: single-shot Needle Needle type: Quincke  Needle gauge: 22 G Additional Notes Consent was obtained prior to the procedure with all questions answered and concerns addressed. Risks including, but not limited to, bleeding, infection, nerve damage, paralysis, failed block, inadequate analgesia, allergic reaction, high spinal, itching, and headache were discussed and the patient wished to proceed. Functioning IV was confirmed and monitors were applied. Sterile prep and drape, including hand hygiene, mask, and sterile gloves were used. The patient was positioned and the spine was prepped. The skin was anesthetized with lidocaine. Free flow of clear CSF was obtained prior to injecting local anesthetic into the CSF. The spinal needle aspirated freely following injection. The needle was carefully withdrawn. The patient tolerated the procedure well.   Renold Don, MD

## 2018-08-03 NOTE — Anesthesia Procedure Notes (Signed)
Procedure Name: MAC Date/Time: 08/03/2018 8:45 AM Performed by: Jenne Campus, CRNA Pre-anesthesia Checklist: Patient identified, Emergency Drugs available, Suction available and Patient being monitored Oxygen Delivery Method: Nasal cannula

## 2018-08-03 NOTE — Op Note (Signed)
08/03/2018  11:22 AM  PATIENT:  [name]  77 y.o. female  PRE-OPERATIVE DIAGNOSIS:  left hip fx, 4 part intertroch  POST-OPERATIVE DIAGNOSIS:  left hip fx, 4 part intertroch  PROCEDURE:  Procedure(s): INTRAMEDULLARY (IM) NAIL INTERTROCHANTRIC (Left) with Biomet Affixus Nail, 62mm x 320mm  SURGEON:  Surgeon(s) and Role:    Altamese Fairfield, MD - Primary  PHYSICIAN ASSISTANT: Ainsley Spinner, PA-C  ANESTHESIA:   general  EBL:  50 mL   BLOOD ADMINISTERED:none  DRAINS: none   LOCAL MEDICATIONS USED:  NONE  SPECIMEN:  No Specimen  DISPOSITION OF SPECIMEN:  N/A  COUNTS:  YES  TOURNIQUET:  * No tourniquets in log *  DICTATION: written  PLAN OF CARE: Admit to inpatient   PATIENT DISPOSITION:  PACU - hemodynamically stable.   Delay start of Pharmacological VTE agent (>24hrs) due to surgical blood loss or risk of bleeding: no  PROCEDURE:  Intramedullary nailing of the right hip using a Biomet Affixus nail.  SURGEON:  Astrid Divine. Marcelino Scot, M.D.  ASSISTANT:  Ainsley Spinner, PA-C.  ANESTHESIA:  General.  COMPLICATIONS:  None.  ESTIMATED BLOOD LOSS:  Less than 150 mL.  DISPOSITION:  To PACU.  CONDITION:  Stable.  BRIEF SUMMARY AND INDICATION OF PROCEDURE:  [name] is a 76 y.o. year- old with multiple medical problems.  I discussed with the patient and family risks and benefits of surgical treatment including the potential for malunion, nonunion, symptomatic hardware, heart attack, stroke, neurovascular injury, bleeding, and others.  After full discussion, the patient and family wished to proceed.  BRIEF SUMMARY OF PROCEDURE:  The patient was taken to the operating room where general anesthesia was induced.  She was positioned supine on the Hana fracture table.  A closed reduction maneuver was performed of the fractured proximal femur and this was confirmed on both AP and lateral xray views. A thorough scrub and wash with chlorhexidine and then Betadine scrub and  paint was performed.  After sterile drapes and time-out, a long instrument was used to identify the appropriate starting position under C-arm on both AP and lateral images.  A 3 cm incision was made proximal to the greater trochanter.  The curved cannulated awl was inserted just medial to the tip of the lateral trochanter and then the starting guidewire advanced into the proximal femur.  This was checked on AP and lateral views.  The starting reamer was engaged with the soft tissue protected by a sleeve.  The curved ball-tipped guidewire was then inserted, making sure it was just posterior as possible in the distal femur and across the fracture site, which stayed in a reduced position.  It was sequentially reamed up to 37mm and an 11 x 320 mm nail inserted to the appropriate depth.  The guidewire for the lag screw was then inserted with the appropriate anteversion to make sure it was in a center-center position. An antirotation pin was placed just superior to this in the head. The lag screw pin was measured and the lag screw placed with excellent purchase and position checked on both views.  The threaded screw within the nail was then engaged within the groove of the lag screw, which was allowed to Telescope but not rotate. The antirotation pin was withdrawn. Traction was released and compression achieved with the screw.  This was followed by placement of one distal locking screw using perfect circle technique.  This was confirmed on AP and lateral images. Wounds were irrigated thoroughly, closed  in a standard layered fashion. Sterile gently compressive dressings were applied.  The patient was awakened from anesthesia and transported to the PACU in stable condition.  PROGNOSIS:  The patient will be weightbearing as tolerated with physical therapy beginning DVT prophylaxis as soon as deemed stable by the Primary Care Service.  She has no range of motion precautions.  We will continue to follow  through at the hospital.  Anticipate follow up in the office in 2 weeks for removal of sutures and further evaluation.     Astrid Divine. Marcelino Scot, M.D.

## 2018-08-03 NOTE — Progress Notes (Signed)
Family at bedside, fully updated. Pt is calmer with them at bedside.

## 2018-08-03 NOTE — Plan of Care (Signed)
  Problem: Clinical Measurements: Goal: Ability to maintain clinical measurements within normal limits will improve Outcome: Progressing   

## 2018-08-03 NOTE — Anesthesia Postprocedure Evaluation (Signed)
Anesthesia Post Note  Patient: [name]  Procedure(s) Performed: INTRAMEDULLARY (IM) NAIL INTERTROCHANTRIC (Left )     Patient location during evaluation: PACU Anesthesia Type: Spinal Level of consciousness: awake and alert Pain management: pain level controlled Vital Signs Assessment: post-procedure vital signs reviewed and stable Respiratory status: spontaneous breathing and respiratory function stable Cardiovascular status: blood pressure returned to baseline, stable and tachycardic Postop Assessment: spinal receding and no apparent nausea or vomiting Anesthetic complications: no    Last Vitals:  Vitals:   08/03/18 1145 08/03/18 1200  BP: (!) 154/59 (!) 152/69  Pulse: (!) 117 (!) 117  Resp: (!) 22 (!) 21  Temp:    SpO2: 100% 100%    Last Pain:  Vitals:   08/03/18 1200  TempSrc:   PainSc: 0-No pain                 Audry Pili

## 2018-08-04 DIAGNOSIS — D62 Acute posthemorrhagic anemia: Secondary | ICD-10-CM

## 2018-08-04 LAB — PTH, INTACT AND CALCIUM
CALCIUM TOTAL (PTH): 8.6 mg/dL — AB (ref 8.7–10.3)
PTH: 27 pg/mL (ref 15–65)

## 2018-08-04 LAB — CBC
HCT: 24.9 % — ABNORMAL LOW (ref 36.0–46.0)
Hemoglobin: 7.4 g/dL — ABNORMAL LOW (ref 12.0–15.0)
MCH: 27.2 pg (ref 26.0–34.0)
MCHC: 29.7 g/dL — AB (ref 30.0–36.0)
MCV: 91.5 fL (ref 80.0–100.0)
PLATELETS: 226 10*3/uL (ref 150–400)
RBC: 2.72 MIL/uL — ABNORMAL LOW (ref 3.87–5.11)
RDW: 12.8 % (ref 11.5–15.5)
WBC: 8.9 10*3/uL (ref 4.0–10.5)
nRBC: 0 % (ref 0.0–0.2)

## 2018-08-04 LAB — BASIC METABOLIC PANEL
Anion gap: 7 (ref 5–15)
BUN: 9 mg/dL (ref 8–23)
CALCIUM: 8.7 mg/dL — AB (ref 8.9–10.3)
CHLORIDE: 105 mmol/L (ref 98–111)
CO2: 29 mmol/L (ref 22–32)
CREATININE: 0.87 mg/dL (ref 0.44–1.00)
GFR calc Af Amer: >60 mL/min (ref >60)
Glucose, Bld: 96 mg/dL (ref 70–99)
Potassium: 4.8 mmol/L (ref 3.5–5.1)
SODIUM: 141 mmol/L (ref 135–145)

## 2018-08-04 LAB — CALCITRIOL (1,25 DI-OH VIT D): VIT D 1 25 DIHYDROXY: 51.3 pg/mL (ref 19.9–79.3)

## 2018-08-04 LAB — PREPARE RBC (CROSSMATCH)

## 2018-08-04 LAB — VITAMIN D 25 HYDROXY (VIT D DEFICIENCY, FRACTURES): VIT D 25 HYDROXY: 37.1 ng/mL (ref 30.0–100.0)

## 2018-08-04 MED ORDER — SODIUM CHLORIDE 0.9% IV SOLUTION
Freq: Once | INTRAVENOUS | Status: AC
  Administered 2018-08-04: 13:00:00 via INTRAVENOUS

## 2018-08-04 NOTE — Plan of Care (Signed)

## 2018-08-04 NOTE — Progress Notes (Addendum)
  PROGRESS NOTE  [name] RJJ:[id number] DOB: 12-Nov-1940 DOA: 08/01/2018 PCP: [name], MD  Brief Narrative: 77 year old woman PMH diastolic CHF, COPD, hypothyroidism presented after mechanical fall at home resulting in left hip pain.  Imaging confirmed comminuted, displaced left intertrochanteric fracture.  Assessment/Plan Left intertrochanteric hip fracture.  Status post surgery 11/27. --Status post surgery today.   --Per orthopedics continue physical therapy.  DVT prophylaxis per orthopedics enoxaparin for 30 days.  Acute blood loss anemia secondary to fracture, surgery --Hemoglobin trending down but no bleeding.  Follow clinically.  Check CBC in a.m.  COPD, chronic hypoxic respiratory failure on home oxygen --Appears stable.  Continue bronchodilators.  Chronic diastolic CHF --Appears stable, I/O relatively balanced.  Continue diltiazem.  Resume torsemide tomorrow.  Hypothyroidism --Continue levothyroxine.  Essential hypertension --Appears stable.  Moderate malnutrition --Continue Ensure, Magic cup, multivitamin per dietitian  DVT prophylaxis: enoxaparin Code Status: Full Family Communication:  Disposition Plan: SNF per PT   Murray Hodgkins, MD  [facility] Direct contact: [phone] --[place]ia [place]  --[email or web address]; password [email or web address]  7PM-7AM contact night coverage as above 08/04/2018, 12:38 PM  LOS: 3 days   Consultants:  Orthopedics   Procedures:  INTRAMEDULLARY (IM) NAIL INTERTROCHANTRIC (Left) with Biomet Affixus Nail, 70mm x 375mm  Antimicrobials:    Interval history/Subjective: Breathing better now.  Overall feels okay.  Objective: Vitals:  Vitals:   08/04/18 0442 08/04/18 0846  BP: 137/62 (!) 143/61  Pulse: (!) 102 100  Resp: 14 16  Temp: 98.1 F (36.7 C) 98.5 F (36.9 C)  SpO2: 99% 98%    Exam: Constitutional:   . Appears calm and comfortable Respiratory:  . CTA bilaterally, no w/r/r.  . Respiratory effort  appears perhaps mildly increased but overall stable. Cardiovascular:  . RRR, no m/r/g . No LE extremity edema   Psychiatric:  . Mental status o Mood, affect appropriate  I have personally reviewed the following:   Data: . BMP unremarkable . Hemoglobin trending down, 7.4.  Remainder CBC unremarkable.  Scheduled Meds: . sodium chloride   Intravenous Once  . buPROPion  150 mg Oral Daily  . diltiazem  120 mg Oral Daily  . enoxaparin (LOVENOX) injection  30 mg Subcutaneous QHS  . feeding supplement (ENSURE ENLIVE)  237 mL Oral BID BM  . levothyroxine  50 mcg Oral QAC breakfast  . multivitamin with minerals  1 tablet Oral Daily  . pantoprazole  40 mg Oral QAC breakfast  . pravastatin  40 mg Oral q morning - 10a  . senna-docusate  1 tablet Oral BID  . umeclidinium bromide  1 puff Inhalation Daily   Continuous Infusions:   Principal Problem:   Closed left hip fracture, initial encounter ([place]) Active Problems:   COPD Golds D, frequent exacerbations   Hypertension   Hyperlipidemia   Hypothyroidism   Chronic respiratory failure with hypoxia (HCC)   Diastolic dysfunction   Malnutrition of moderate degree   Acute blood loss anemia   LOS: 3 days

## 2018-08-04 NOTE — Progress Notes (Addendum)
Patient ID: [name], female   DOB: 1941-05-20, 77 y.o.   MRN: [id number]     Subjective:  Patient reports pain as mild.  Patient in the chair and in no acute distress some SOB but overall much better  Objective:   VITALS:   Vitals:   08/03/18 2009 08/04/18 0034 08/04/18 0442 08/04/18 0846  BP: 106/77 (!) 130/59 137/62 (!) 143/61  Pulse: (!) 116 (!) 105 (!) 102 100  Resp: 20 14 14 16   Temp: 98.4 F (36.9 C) 98.4 F (36.9 C) 98.1 F (36.7 C) 98.5 F (36.9 C)  TempSrc: Oral Oral Oral Oral  SpO2: 100% 100% 99% 98%  Weight:      Height:        ABD soft Sensation intact distally Dorsiflexion/Plantar flexion intact Incision: dressing C/D/I and no drainage   Lab Results  Component Value Date   WBC 8.9 08/04/2018   HGB 7.4 (L) 08/04/2018   HCT 24.9 (L) 08/04/2018   MCV 91.5 08/04/2018   PLT 226 08/04/2018   BMET    Component Value Date/Time   NA 141 08/04/2018 0237   K 4.8 08/04/2018 0237   CL 105 08/04/2018 0237   CO2 29 08/04/2018 0237   GLUCOSE 96 08/04/2018 0237   BUN 9 08/04/2018 0237   CREATININE 0.87 08/04/2018 0237   CREATININE 0.74 06/17/2015 1421   CALCIUM 8.7 (L) 08/04/2018 0237   CALCIUM 8.6 (L) 08/03/2018 0336   GFRNONAA >60 08/04/2018 0237   GFRAA >60 08/04/2018 0237     Assessment/Plan: 1 Day Post-Op   Principal Problem:   Closed left hip fracture, initial encounter ([place]) Active Problems:   COPD Golds D, frequent exacerbations   Hypertension   Hyperlipidemia   Hypothyroidism   Chronic respiratory failure with hypoxia (HCC)   Diastolic dysfunction   Malnutrition of moderate degree   Advance diet Up with therapy ABLA plan for blood 2 units Continue plan per medicine WBAT Dry dressing PRN   Lunette Stands 08/04/2018, 10:49 AM  Discussed and agree with above.  Marchia Bond, MD Cell [phone]

## 2018-08-04 NOTE — Clinical Social Work Note (Signed)
Clinical Social Work Assessment  Patient Details  Name: [name] MRN: [id number] Date of Birth: [birth year]  Date of referral:  08/04/18               Reason for consult:  Discharge Planning                Permission sought to share information with:  Case Manager, Facility Sport and exercise psychologist, Family Supports Permission granted to share information::  Yes, Verbal Permission Granted  Name::     Comptroller::  SNFs  Relationship::  son  Contact Information:  [phone]  Housing/Transportation Living arrangements for the past 2 months:  [place] of Information:  Patient Patient Interpreter Needed:  None Criminal Activity/Legal Involvement Pertinent to Current Situation/Hospitalization:  No - Comment as needed Significant Relationships:    Lives with:  Adult Children Do you feel safe going back to the place where you live?  No Need for family participation in patient care:  Yes (Comment)  Care giving concerns:  CSW received referral for possible SNF placement at time of discharge. Spoke with patient regarding possibility of SNF placement . Patient's son [name] and family are currently unable to care for her at their home given patient's current needs and fall risk.  Patient and son  expressed understanding of PT recommendation and are agreeable to SNF placement at time of discharge. CSW to continue to follow and assist with discharge planning needs.     Social Worker assessment / plan: Spoke with patient's son [name] concerning possibility of rehab at [facility] before returning home. He believes patient will need SNF after surgery as she has been unable to care for herself at home and family is unable to assist with around the clock care.   Employment status:  Retired Forensic scientist:  Medicare PT Recommendations:  Not assessed at this time [place] / Referral to community resources:  [place]  Patient/Family's Response to care:  Patient  and  Son [name] at bedside recognize need for rehab before returning home and are agreeable to a SNF in [place]. They report preference for  [facility] or [facility] . CSW explained insurance authorization process. Patient's family reported that they want patient to get stronger to be able to come back home.    Patient/Family's Understanding of and Emotional Response to Diagnosis, Current Treatment, and Prognosis:  Patient/family is realistic regarding therapy needs and expressed being hopeful for SNF placement. Patient expressed understanding of CSW role and discharge process as well as medical condition. No questions/concerns about plan or treatment.    Emotional Assessment Appearance:  Appears stated age Attitude/Demeanor/Rapport:  Unable to Assess Affect (typically observed):  Unable to Assess Orientation:  Oriented to Self, Oriented to  Time, Oriented to Place Alcohol / Substance use:  Not Applicable Psych involvement (Current and /or in the community):  No (Comment)  Discharge Needs  Concerns to be addressed:  Discharge Planning Concerns Readmission within the last 30 days:  No Current discharge risk:  Dependent with Mobility Barriers to Discharge:  Continued Medical Work up   [employer], LCSW 08/04/2018, 10:27 AM

## 2018-08-04 NOTE — Evaluation (Signed)
Physical Therapy Evaluation Patient Details Name: [name] MRN: [id number] DOB: Jun 10, 1941 Today's Date: 08/04/2018   History of Present Illness  Patient is a 77 y/o female who presents s/p fall and left hip fx now s/p IM nail 11/27. PMh includes CHF, COPD, diastolic dysfunction, HTN, HLD, depression.  Clinical Impression  Patient presents with pain and post surgical deficits s/p above surgery. Tolerated bed mobility and transfers with Mod A for balance/safety. Able to take a few steps to get to a chair with Mod A and cues. Pt lives with spouse and has multiple falls at home, uses rollator for ambulation. Education re: WB status, exercises etc. Pt with hx of cognitive issues per son, but no mention in chart. Will follow acutely to maximize independence and mobility prior to return home.    Follow Up Recommendations SNF;Supervision for mobility/OOB    Equipment Recommendations  None recommended by PT    Recommendations for Other Services       Precautions / Restrictions Precautions Precautions: Fall Restrictions Weight Bearing Restrictions: Yes LLE Weight Bearing: Partial weight bearing LLE Partial Weight Bearing Percentage or Pounds: 50%      Mobility  Bed Mobility Overal bed mobility: Needs Assistance Bed Mobility: Supine to Sit     Supine to sit: HOB elevated;Mod assist     General bed mobility comments: Assist with LLE and trunk to get to EOB.  Transfers Overall transfer level: Needs assistance Equipment used: Rolling walker (2 wheeled) Transfers: Sit to/from [facility] Sit to Stand: Mod assist Stand pivot transfers: Min assist       General transfer comment: Assist to power to standing wtih cues for 50% WB through LLE (resting on PTs foot). Able tot ake a few steps tog et to chair with step by step cues for advancing LEs. Not sure if pt able to maintain WB status throughout.  Ambulation/Gait                Stairs             Wheelchair Mobility    Modified Rankin (Stroke Patients Only)       Balance Overall balance assessment: Needs assistance;History of Falls Sitting-balance support: Feet supported;No upper extremity supported Sitting balance-Leahy Scale: Fair     Standing balance support: During functional activity;Bilateral upper extremity supported Standing balance-Leahy Scale: Poor Standing balance comment: Reliant on UEs and external support for standing due to WB status                             Pertinent Vitals/Pain Pain Assessment: Faces Faces Pain Scale: Hurts little more Pain Location: LLE with movement Pain Descriptors / Indicators: Sore;Guarding;Grimacing Pain Intervention(s): Monitored during session;Repositioned;Limited activity within patient's tolerance    Home Living Family/patient expects to be discharged to:: Private residence Living Arrangements: Spouse/significant other Available Help at Discharge: Family;Available 24 hours/day Type of Home: House Home Access: Stairs to enter Entrance Stairs-Rails: Right Entrance Stairs-Number of Steps: 4 Home Layout: Two level;Able to live on main level with bedroom/bathroom Home Equipment: Shower seat;Walker - 4 wheels;Bedside commode;Walker - 2 wheels      Prior Function Level of Independence: Needs assistance   Gait / Transfers Assistance Needed: Using rollator for ambulation  ADL's / Homemaking Assistance Needed: Does own dressing/bathing. Family helps with IADLs.  Comments: Wears 02 at home     Hand Dominance        Extremity/Trunk Assessment   Upper  Extremity Assessment Upper Extremity Assessment: Defer to OT evaluation    Lower Extremity Assessment Lower Extremity Assessment: RLE deficits/detail;Difficult to assess due to impaired cognition RLE Deficits / Details: Able to perform LAQ. RLE Sensation: WNL    Cervical / Trunk Assessment Cervical / Trunk Assessment: Kyphotic  Communication    Communication: No difficulties  Cognition Arousal/Alertness: Awake/alert Behavior During Therapy: WFL for tasks assessed/performed Overall Cognitive Status: History of cognitive impairments - at baseline                                 General Comments: A&Ox2- per son cognition comes and goes.       General Comments General comments (skin integrity, edema, etc.): Son present during sesison and provided PLOF/history.    Exercises     Assessment/Plan    PT Assessment Patient needs continued PT services  PT Problem List Decreased strength;Decreased mobility;Decreased safety awareness;Pain;Decreased balance;Decreased knowledge of use of DME;Decreased activity tolerance;Decreased cognition;Decreased skin integrity;Decreased knowledge of precautions;Decreased range of motion       PT Treatment Interventions Functional mobility training;Balance training;Patient/family education;Gait training;Therapeutic activities;Wheelchair mobility training;Therapeutic exercise;Cognitive remediation;DME instruction    PT Goals (Current goals can be found in the Care Plan section)  Acute Rehab PT Goals Patient Stated Goal: to get out of this bed PT Goal Formulation: With patient/family Time For Goal Achievement: 08/18/18 Potential to Achieve Goals: Good    Frequency Min 3X/week   Barriers to discharge Decreased caregiver support      Co-evaluation               AM-PAC PT "6 Clicks" Mobility  Outcome Measure Help needed turning from your back to your side while in a flat bed without using bedrails?: A Little Help needed moving from lying on your back to sitting on the side of a flat bed without using bedrails?: A Lot Help needed moving to and from a bed to a chair (including a wheelchair)?: A Lot Help needed standing up from a chair using your arms (e.g., wheelchair or bedside chair)?: A Lot Help needed to walk in hospital room?: A Lot Help needed climbing 3-5 steps with a  railing? : Total 6 Click Score: 12    End of Session Equipment Utilized During Treatment: Gait belt Activity Tolerance: Patient tolerated treatment well Patient left: in chair;with call bell/phone within reach;with family/visitor present Nurse Communication: Mobility status PT Visit Diagnosis: Muscle weakness (generalized) (M62.81);Pain;Difficulty in walking, not elsewhere classified (R26.2) Pain - Right/Left: Left Pain - part of body: Leg    Time: 0786-7544 PT Time Calculation (min) (ACUTE ONLY): 20 min   Charges:   PT Evaluation $PT Eval Moderate Complexity: 1 Mod          Wray Kearns, PT, DPT Acute Rehabilitation Services Pager [phone] Office [phone]      [name] 08/04/2018, 10:49 AM

## 2018-08-05 ENCOUNTER — Encounter (HOSPITAL_COMMUNITY): Payer: Self-pay | Admitting: Orthopedic Surgery

## 2018-08-05 DIAGNOSIS — I10 Essential (primary) hypertension: Secondary | ICD-10-CM

## 2018-08-05 LAB — BPAM RBC
Blood Product Expiration Date: 201912192359
Blood Product Expiration Date: 201912202359
ISSUE DATE / TIME: 201911281335
ISSUE DATE / TIME: 201911281722
Unit Type and Rh: 6200
Unit Type and Rh: 6200

## 2018-08-05 LAB — CBC
HCT: 37 % (ref 36.0–46.0)
HEMOGLOBIN: 12.4 g/dL (ref 12.0–15.0)
MCH: 28.8 pg (ref 26.0–34.0)
MCHC: 33.5 g/dL (ref 30.0–36.0)
MCV: 85.8 fL (ref 80.0–100.0)
PLATELETS: 194 10*3/uL (ref 150–400)
RBC: 4.31 MIL/uL (ref 3.87–5.11)
RDW: 12.9 % (ref 11.5–15.5)
WBC: 10.6 10*3/uL — ABNORMAL HIGH (ref 4.0–10.5)
nRBC: 0 % (ref 0.0–0.2)

## 2018-08-05 LAB — TYPE AND SCREEN
ABO/RH(D): A POS
Antibody Screen: NEGATIVE
UNIT DIVISION: 0
Unit division: 0

## 2018-08-05 MED ORDER — ALPRAZOLAM 0.25 MG PO TABS
0.2500 mg | ORAL_TABLET | Freq: Three times a day (TID) | ORAL | Status: DC | PRN
  Administered 2018-08-06 (×2): 0.25 mg via ORAL
  Filled 2018-08-05 (×3): qty 1

## 2018-08-05 MED ORDER — TORSEMIDE 20 MG PO TABS
20.0000 mg | ORAL_TABLET | Freq: Every day | ORAL | Status: DC
  Administered 2018-08-05 – 2018-08-08 (×4): 20 mg via ORAL
  Filled 2018-08-05 (×4): qty 1

## 2018-08-05 NOTE — NC FL2 (Signed)
[place] LEVEL OF CARE SCREENING TOOL     IDENTIFICATION  Patient Name: [name] Birthdate: [birth year] Sex: female Admission Date (Current Location): 08/01/2018  [facility] and [place] Number:  [employer] and Address:  [place], [place] [address] [place], [place] [address]      Provider Number: [id number]  Attending Physician Name and Address:  Samuella Cota, MD  Relative Name and Phone Number:  Rodman Key (son) [phone]    Current Level of Care: Hospital Recommended Level of Care: [place] Prior Approval Number:    Date Approved/Denied:   PASRR Number:    Discharge Plan: SNF    Current Diagnoses: Patient Active Problem List   Diagnosis Date Noted  . Acute blood loss anemia 08/04/2018  . Malnutrition of moderate degree 08/02/2018  . Closed left hip fracture, initial encounter ([place]) 08/01/2018  . Diastolic dysfunction 36/62/9476  . Closed sacral fracture/S2-S3 02/27/2018  . Leg weakness, bilateral 02/26/2018  . Fall at home, initial encounter 02/26/2018  . Oral thrush 02/26/2017  . Rectal bleeding 10/21/2016  . Abdominal pain 06/06/2015  . Dysphagia   . Chronic respiratory failure with hypoxia ([place]) 02/02/2014  . Alopecia 02/02/2014  . Loss of weight 01/24/2014  . Unspecified constipation 01/24/2014  . Esophageal dysphagia 01/24/2014  . COPD Golds D, frequent exacerbations   . Hypertension   . Hyperlipidemia   . Hypothyroidism   . Depression     Orientation RESPIRATION BLADDER Height & Weight     Self, Time, Situation, Place  O2(Nasal Cannula 2-3L/min) Incontinent, External catheter Weight: 100 lb (45.4 kg) Height:  5' (152.4 cm)  BEHAVIORAL SYMPTOMS/MOOD NEUROLOGICAL BOWEL NUTRITION STATUS      Continent Diet(see discharge summary)  AMBULATORY STATUS COMMUNICATION OF NEEDS Skin   Extensive Assist Verbally Surgical wounds(left hip closed surgical incision)                     Personal Care Assistance Level of Assistance  Bathing, Feeding, Dressing, Total care Bathing Assistance: Limited assistance Feeding assistance: Independent Dressing Assistance: Limited assistance Total Care Assistance: Maximum assistance   Functional Limitations Info  Sight, Hearing, Speech Sight Info: Adequate Hearing Info: Adequate Speech Info: Adequate    SPECIAL CARE FACTORS FREQUENCY  PT (By licensed PT), OT (By licensed OT)     PT Frequency: min 5x weekly OT Frequency: min 3x weekly            Contractures Contractures Info: Not present    Additional Factors Info  Code Status, Allergies Code Status Info: full Allergies Info: Allergies:  Bee Venom           Current Medications (08/05/2018):  This is the current hospital active medication list Current Facility-Administered Medications  Medication Dose Route Frequency Provider Last Rate Last Dose  . acetaminophen (TYLENOL) tablet 650 mg  650 mg Oral Q6H PRN Ainsley Spinner, PA-C   650 mg at 08/04/18 2020   Or  . acetaminophen (TYLENOL) suppository 650 mg  650 mg Rectal Q6H PRN Ainsley Spinner, PA-C      . albuterol (PROVENTIL) (2.5 MG/3ML) 0.083% nebulizer solution 2.5 mg  2.5 mg Nebulization Q6H PRN Ainsley Spinner, PA-C      . bisacodyl (DULCOLAX) suppository 10 mg  10 mg Rectal Daily PRN Ainsley Spinner, PA-C      . buPROPion (WELLBUTRIN XL) 24 hr tablet 150 mg  150 mg Oral Daily Ainsley Spinner, PA-C   150 mg at  08/04/18 0850  . diltiazem (CARDIZEM CD) 24 hr capsule 120 mg  120 mg Oral Daily Ainsley Spinner, PA-C   120 mg at 08/04/18 0849  . enoxaparin (LOVENOX) injection 30 mg  30 mg Subcutaneous QHS Alvira Philips, [place]   30 mg at 08/04/18 2143  . feeding supplement (ENSURE ENLIVE) (ENSURE ENLIVE) liquid 237 mL  237 mL Oral BID BM Ainsley Spinner, PA-C   237 mL at 08/04/18 1438  . ipratropium (ATROVENT) nebulizer solution 0.5 mg  0.5 mg Nebulization Q4H PRN Ainsley Spinner, PA-C      . levalbuterol Penne Lash) nebulizer solution 0.63  mg  0.63 mg Nebulization Q6H PRN Ainsley Spinner, PA-C      . levothyroxine (SYNTHROID, LEVOTHROID) tablet 50 mcg  50 mcg Oral QAC breakfast Ainsley Spinner, PA-C   50 mcg at 08/05/18 7342  . multivitamin with minerals tablet 1 tablet  1 tablet Oral Daily Ainsley Spinner, PA-C   1 tablet at 08/04/18 0850  . ondansetron (ZOFRAN) tablet 4 mg  4 mg Oral Q6H PRN Ainsley Spinner, PA-C       Or  . ondansetron [facility]) injection 4 mg  4 mg Intravenous Q6H PRN Ainsley Spinner, PA-C      . oxyCODONE (Oxy IR/ROXICODONE) immediate release tablet 5 mg  5 mg Oral Q4H PRN Ainsley Spinner, PA-C   5 mg at 08/04/18 1303  . pantoprazole (PROTONIX) EC tablet 40 mg  40 mg Oral QAC breakfast Ainsley Spinner, PA-C   40 mg at 08/04/18 0851  . polyethylene glycol (MIRALAX / GLYCOLAX) packet 17 g  17 g Oral Daily PRN Ainsley Spinner, PA-C      . pravastatin (PRAVACHOL) tablet 40 mg  40 mg Oral q morning - 10a Ainsley Spinner, PA-C   40 mg at 08/04/18 0850  . senna-docusate (Senokot-S) tablet 1 tablet  1 tablet Oral BID Ainsley Spinner, PA-C   1 tablet at 08/04/18 2143  . umeclidinium bromide (INCRUSE ELLIPTA) 62.5 MCG/INH 1 puff  1 puff Inhalation Daily Ainsley Spinner, PA-C   1 puff at 08/04/18 8768   Facility-Administered Medications Ordered in Other Encounters  Medication Dose Route Frequency Provider Last Rate Last Dose  . neomycin-polymyxin-dexameth (MAXITROL) 0.1 % ophth ointment    PRN Tonny Branch, MD   1 application at 11/57/26 1227     Discharge Medications: Please see discharge summary for a list of discharge medications.  Relevant Imaging Results:  Relevant Lab Results:   Additional Information SSN: [id number]  [name], LCSW

## 2018-08-05 NOTE — Care Management Important Message (Signed)
Important Message  Patient Details  Name: [name] MRN: [id number] Date of Birth: [birth year]   Medicare Important Message Given:  Yes    Zaccai Chavarin 08/05/2018, 11:41 AM

## 2018-08-05 NOTE — Plan of Care (Signed)
  Problem: Clinical Measurements: Goal: Ability to maintain clinical measurements within normal limits will improve Outcome: Progressing Goal: Will remain free from infection Outcome: Progressing Goal: Diagnostic test results will improve Outcome: Progressing Goal: Respiratory complications will improve Outcome: Progressing   Problem: Elimination: Goal: Will not experience complications related to bowel motility Outcome: Progressing Goal: Will not experience complications related to urinary retention Outcome: Progressing   Problem: Pain Managment: Goal: General experience of comfort will improve Outcome: Progressing

## 2018-08-05 NOTE — Progress Notes (Signed)
Physical Therapy Treatment Patient Details Name: [name] MRN: [id number] DOB: 03/22/1941 Today's Date: 08/05/2018    History of Present Illness Patient is a 77 y/o female who presents s/p fall and left hip fx now s/p IM nail 11/27. PMh includes CHF, COPD, diastolic dysfunction, HTN, HLD, depression.    PT Comments    Patient's tolerance to treatment today was fair.  Patient was in bed with no visitors present upon PT arrival.  O2 sats initially measured at 96-97% on 2L O2 via [place].  Pt required encouragement and education to get out of bed today.  Pt was able to complete steps to and from bed with RW today requiring mod A +2 for steadying and to maintain WB status.  Patient became SOB during ambulation.  Pt was educated on pursed lip breathing when returned to bed.  O2 sats were measured at 88-89% on 2L O2 via [place].  Pt's oxygen sats recovered at 96% on 2L O2 via  at end of treatment.  Patient continues to be a good candidate for SNF placement based on current functional status.    Follow Up Recommendations  SNF;Supervision for mobility/OOB     Equipment Recommendations  None recommended by PT    Recommendations for Other Services       Precautions / Restrictions Precautions Precautions: Fall Restrictions Weight Bearing Restrictions: Yes LLE Weight Bearing: Partial weight bearing LLE Partial Weight Bearing Percentage or Pounds: 50    Mobility  Bed Mobility Overal bed mobility: Needs Assistance Bed Mobility: Supine to Sit;Sit to Supine     Supine to sit: HOB elevated;Mod assist Sit to supine: Mod assist;HOB elevated   General bed mobility comments: Assist with LLE and trunk to get to EOB.  Therapist utilized bed pad to scoot hips toward EOB.  Transfers Overall transfer level: Needs assistance Equipment used: Rolling walker (2 wheeled) Transfers: Sit to/from Stand Sit to Stand: Mod assist;+2 physical assistance         General transfer comment: Pt required VC for  correct hand placement on bed surface prior to standing.  Mod A +1 to boost into standing during first trial.  Pt was able to stand briefly but had to sit before second attempt.  Mod A+2 to boost into standing for second attempt.  Ambulation/Gait Ambulation/Gait assistance: Mod assist;+2 physical assistance Gait Distance (Feet): 10 Feet Assistive device: Rolling walker (2 wheeled) Gait Pattern/deviations: Step-to pattern;Decreased step length - left;Antalgic Gait velocity: decreased   General Gait Details: Ambulation was limited to steps to and from bed.  VC provided for patient to maintain WB status.  VC provided for patient to push through RW with B UE to advance L LE.   Stairs             Wheelchair Mobility    Modified Rankin (Stroke Patients Only)       Balance Overall balance assessment: Needs assistance;History of Falls Sitting-balance support: Feet supported;No upper extremity supported Sitting balance-Leahy Scale: Fair     Standing balance support: During functional activity;Bilateral upper extremity supported Standing balance-Leahy Scale: Poor Standing balance comment: Reliant on UEs and external support for standing due to WB status                            Cognition Arousal/Alertness: Awake/alert Behavior During Therapy: WFL for tasks assessed/performed Overall Cognitive Status: History of cognitive impairments - at baseline  General Comments: A&Ox4 today      Exercises General Exercises - Lower Extremity Heel Slides: AROM;Both;10 reps;Supine    General Comments        Pertinent Vitals/Pain Pain Assessment: Faces Faces Pain Scale: Hurts a little bit Pain Location: LLE with movement Pain Descriptors / Indicators: Sore;Guarding;Grimacing Pain Intervention(s): Limited activity within patient's tolerance;Monitored during session    Home Living                      Prior Function             PT Goals (current goals can now be found in the care plan section) Acute Rehab PT Goals Patient Stated Goal: to get better Potential to Achieve Goals: Good Progress towards PT goals: Progressing toward goals    Frequency    Min 3X/week      PT Plan Current plan remains appropriate    Co-evaluation              AM-PAC PT "6 Clicks" Mobility   Outcome Measure  Help needed turning from your back to your side while in a flat bed without using bedrails?: A Lot Help needed moving from lying on your back to sitting on the side of a flat bed without using bedrails?: A Lot Help needed moving to and from a bed to a chair (including a wheelchair)?: A Lot Help needed standing up from a chair using your arms (e.g., wheelchair or bedside chair)?: A Lot Help needed to walk in hospital room?: A Lot Help needed climbing 3-5 steps with a railing? : Total 6 Click Score: 11    End of Session Equipment Utilized During Treatment: Gait belt;Oxygen Activity Tolerance: Patient tolerated treatment well Patient left: in chair;with call bell/phone within reach;with family/visitor present Nurse Communication: Mobility status PT Visit Diagnosis: Muscle weakness (generalized) (M62.81);Pain;Difficulty in walking, not elsewhere classified (R26.2) Pain - Right/Left: Left Pain - part of body: Leg     Time: 1314-3888 PT Time Calculation (min) (ACUTE ONLY): 24 min  Charges:  $Therapeutic Activity: 23-37 mins                     [address], SPTA   [name] 08/05/2018, 5:20 PM

## 2018-08-05 NOTE — Consult Note (Signed)
[facility] Inpatient Consult   08/05/2018  [name] 1940/11/05 [id number]  Patient was assessed for [facility] [employer] for community services for possible restart. Met with patient and daughter, Loletha Carrow,  at bedside regarding [facility] services under patient's Medicare. Chart review reveals patient is being recommended for SNF for rehab.  They are hopeful for [facility] in [place] or [facility] in [place]. No current community needs.  However, daughter states they have concerns for returning home needs and pending her progress.  Will follow for disposition and needs.    Of note, [facility] Care Management services does not replace or interfere with any services that are arranged by inpatient case management or social work. For additional questions or referrals please contact:  Natividad Brood, RN BSN [facility] Liaison  [phone] business mobile phone Toll free office [phone]

## 2018-08-05 NOTE — Evaluation (Signed)
Occupational Therapy Evaluation Patient Details Name: [name] MRN: [id number] DOB: September 14, 1940 Today's Date: 08/05/2018    History of Present Illness Patient is a 77 y/o female who presents s/p fall and left hip fx now s/p IM nail 11/27. PMh includes CHF, COPD, diastolic dysfunction, HTN, HLD, depression.   Clinical Impression   PTA Pt independent in ADL - family performs IADL, and used Rollator for mobility. Pt is currently mod to max A for bed mobility to come EOB. Without a second staff member present - did not attempt OOB transfer at this time. Focused on AE education for LB ADL. Pt will require SNF level care post-acute to maximize safety and independence in ADL and functional transfers. Next OT session to focus on OOB transfers to toilet and continued education for weight bearing status and compensatory strategies for ADL.     Follow Up Recommendations  SNF;Supervision/Assistance - 24 hour    Equipment Recommendations  Other (comment)(defer to next venue of care)    Recommendations for Other Services       Precautions / Restrictions Precautions Precautions: Fall Restrictions Weight Bearing Restrictions: Yes LLE Weight Bearing: Partial weight bearing LLE Partial Weight Bearing Percentage or Pounds: 50      Mobility Bed Mobility Overal bed mobility: Needs Assistance Bed Mobility: Supine to Sit;Sit to Supine     Supine to sit: HOB elevated;Mod assist Sit to supine: Mod assist;HOB elevated   General bed mobility comments: Assist with LLE and trunk to get to EOB.  Therapist utilized bed pad to scoot hips toward EOB.  Transfers Overall transfer level: Needs assistance Equipment used: Rolling walker (2 wheeled) Transfers: Sit to/from Stand Sit to Stand: Mod assist;+2 physical assistance         General transfer comment: NT this session    Balance Overall balance assessment: Needs assistance;History of Falls Sitting-balance support: Feet supported;No upper  extremity supported Sitting balance-Leahy Scale: Fair Sitting balance - Comments: able to complete grooming activities EOB   Standing balance support: During functional activity;Bilateral upper extremity supported Standing balance-Leahy Scale: Poor Standing balance comment: Reliant on UEs and external support for standing due to WB status                           ADL either performed or assessed with clinical judgement   ADL Overall ADL's : Needs assistance/impaired Eating/Feeding: Set up;Sitting   Grooming: Wash/dry hands;Wash/dry face;Set up;Sitting;Oral care Grooming Details (indicate cue type and reason): EOB Upper Body Bathing: Minimal assistance Upper Body Bathing Details (indicate cue type and reason): assist for back Lower Body Bathing: Maximal assistance   Upper Body Dressing : Minimal assistance;Sitting   Lower Body Dressing: Maximal assistance;+2 for physical assistance;+2 for safety/equipment;Sit to/from stand   Toilet Transfer: Moderate assistance;+2 for physical assistance;+2 for safety/equipment;Stand-pivot   Toileting- Clothing Manipulation and Hygiene: Maximal assistance;+2 for physical assistance;+2 for safety/equipment       Functional mobility during ADLs: Moderate assistance;+2 for safety/equipment;+2 for physical assistance;Rolling walker General ADL Comments: Pt unable to access LB for ADL at this time due to pain and decreased ROM at the hip     Vision         Perception     Praxis      Pertinent Vitals/Pain Pain Assessment: 0-10 Pain Score: 5  Faces Pain Scale: Hurts a little bit Pain Location: LLE with movement Pain Descriptors / Indicators: Sore;Guarding;Grimacing Pain Intervention(s): Limited activity within patient's tolerance;Monitored during session;Repositioned(declined ice)  Hand Dominance     Extremity/Trunk Assessment Upper Extremity Assessment Upper Extremity Assessment: Overall WFL for tasks  assessed;Generalized weakness   Lower Extremity Assessment Lower Extremity Assessment: Defer to PT evaluation   Cervical / Trunk Assessment Cervical / Trunk Assessment: Kyphotic   Communication Communication Communication: No difficulties   Cognition Arousal/Alertness: Awake/alert Behavior During Therapy: WFL for tasks assessed/performed Overall Cognitive Status: History of cognitive impairments - at baseline                                 General Comments: A&Ox4 today   General Comments       Exercises General Exercises - Lower Extremity Heel Slides: AROM;Both;10 reps;Supine   Shoulder Instructions      Home Living Family/patient expects to be discharged to:: Private residence Living Arrangements: Spouse/significant other Available Help at Discharge: Family;Available 24 hours/day Type of Home: House Home Access: Stairs to enter [employer] of Steps: 4 Entrance Stairs-Rails: Right Home Layout: Two level;Able to live on main level with bedroom/bathroom Alternate Level Stairs-Number of Steps: Flight to basement   Bathroom Shower/Tub: Tub/shower unit;Curtain;Walk-in shower   Bathroom Toilet: Standard     Home Equipment: Clinical cytogeneticist - 4 wheels;Bedside commode;Walker - 2 wheels          Prior Functioning/Environment Level of Independence: Needs assistance  Gait / Transfers Assistance Needed: Using rollator for ambulation ADL's / Homemaking Assistance Needed: Does own dressing/bathing. Family helps with IADLs.   Comments: Wears 02 at home        OT Problem List: Decreased strength;Decreased range of motion;Decreased activity tolerance;Impaired balance (sitting and/or standing);Decreased safety awareness;Decreased knowledge of use of DME or AE;Decreased knowledge of precautions;Pain      OT Treatment/Interventions: Self-care/ADL training;DME and/or AE instruction;Therapeutic activities;Patient/family education;Balance training     OT Goals(Current goals can be found in the care plan section) Acute Rehab OT Goals Patient Stated Goal: to get better OT Goal Formulation: With patient Time For Goal Achievement: 08/19/18 Potential to Achieve Goals: Good ADL Goals Pt Will Perform Grooming: with min assist;standing Pt Will Perform Lower Body Bathing: with supervision;with adaptive equipment;sitting/lateral leans Pt Will Perform Lower Body Dressing: with min assist;sit to/from stand;with adaptive equipment Pt Will Transfer to Toilet: with min assist;ambulating Pt Will Perform Toileting - Clothing Manipulation and hygiene: with min assist;sit to/from stand Additional ADL Goal #1: Pt will perform bed mobility at supervision level prior to engaging in ADL activity  OT Frequency: Min 2X/week   Barriers to D/C:            Co-evaluation              AM-PAC OT "6 Clicks" Daily Activity     Outcome Measure Help from another person eating meals?: None Help from another person taking care of personal grooming?: A Little Help from another person toileting, which includes using toliet, bedpan, or urinal?: A Lot Help from another person bathing (including washing, rinsing, drying)?: A Lot Help from another person to put on and taking off regular upper body clothing?: A Little Help from another person to put on and taking off regular lower body clothing?: A Lot 6 Click Score: 16   End of Session Equipment Utilized During Treatment: Oxygen Nurse Communication: Mobility status;Weight bearing status  Activity Tolerance: Patient tolerated treatment well Patient left: in bed;with call bell/phone within reach;with bed alarm set;with family/visitor present  OT Visit Diagnosis: Unsteadiness on feet (R26.81);Other abnormalities of  gait and mobility (R26.89);History of falling (Z91.81);Pain Pain - Right/Left: Left Pain - part of body: Leg                Time: 2641-5830 OT Time Calculation (min): 29 min Charges:  OT General  Charges $OT Visit: 1 Visit OT Evaluation $OT Eval Moderate Complexity: 1 Mod OT Treatments $Self Care/Home Management : 8-22 mins  Hulda Humphrey OTR/L Acute Rehabilitation Services Pager: [phone] Office: [place] Khaliyah Northrop 08/05/2018, 6:44 PM

## 2018-08-05 NOTE — Progress Notes (Addendum)
Patient ID: [name], female   DOB: 1941-04-12, 77 y.o.   MRN: [id number]     Subjective:  Patient reports pain as mild.  But much improved  Objective:   VITALS:   Vitals:   08/04/18 2134 08/05/18 0620 08/05/18 0813 08/05/18 0826  BP: (!) 141/66 (!) 142/68 (!) 180/113   Pulse: (!) 106 96 (!) 118   Resp: 17 16 (!) 22   Temp: 98.3 F (36.8 C) 98.1 F (36.7 C) 97.8 F (36.6 C)   TempSrc: Oral Oral Oral   SpO2: 95% 97% 93% 94%  Weight:      Height:        ABD soft Sensation intact distally Dorsiflexion/Plantar flexion intact Incision: dressing C/D/I and scant drainage   Lab Results  Component Value Date   WBC 10.6 (H) 08/05/2018   HGB 12.4 08/05/2018   HCT 37.0 08/05/2018   MCV 85.8 08/05/2018   PLT 194 08/05/2018   BMET    Component Value Date/Time   NA 141 08/04/2018 0237   K 4.8 08/04/2018 0237   CL 105 08/04/2018 0237   CO2 29 08/04/2018 0237   GLUCOSE 96 08/04/2018 0237   BUN 9 08/04/2018 0237   CREATININE 0.87 08/04/2018 0237   CREATININE 0.74 06/17/2015 1421   CALCIUM 8.7 (L) 08/04/2018 0237   CALCIUM 8.6 (L) 08/03/2018 0336   GFRNONAA >60 08/04/2018 0237   GFRAA >60 08/04/2018 0237     Assessment/Plan: 2 Days Post-Op   Principal Problem:   Closed left hip fracture, initial encounter ([place]) Active Problems:   COPD Golds D, frequent exacerbations   Hypertension   Hyperlipidemia   Hypothyroidism   Chronic respiratory failure with hypoxia (HCC)   Diastolic dysfunction   Malnutrition of moderate degree   Acute blood loss anemia   Advance diet Up with therapy WBAT Dry dressing PRN Continue plan per Medicine    Lunette Stands 08/05/2018, 8:40 AM  Discussed and agree with above.   Marchia Bond, MD Cell [phone]

## 2018-08-05 NOTE — Progress Notes (Signed)
  PROGRESS NOTE  [name] VZC:[id number] DOB: 1941/08/02 DOA: 08/01/2018 PCP: [name], MD  Brief Narrative: 77 year old woman PMH diastolic CHF, COPD, hypothyroidism presented after mechanical fall at home resulting in left hip pain.  Imaging confirmed comminuted, displaced left intertrochanteric fracture.  Assessment/Plan Left intertrochanteric hip fracture.  Status post surgery 11/27. --Doing well.  Continue physical therapy.  Weight-bear as tolerated.  DVT prophylaxis with Lovenox for 30 days.  Acute blood loss anemia secondary to fracture, surgery --Hemoglobin improved appropriately status post transfusion 2 units PRBC.    COPD, chronic hypoxic respiratory failure on home oxygen --Stable.  Continue supplemental oxygen, bronchodilators.  Chronic diastolic CHF --Appears compensated.  Continue diltiazem.  Resume torsemide.    Hypothyroidism --Continue levothyroxine  Essential hypertension --Fair control.  Continue diltiazem.  Resume torsemide.  Moderate malnutrition --Continue Ensure, Magic cup, multivitamin per dietitian   Stable for discharge.  Discussed with social work.  Needs PASSAR number, will not be available until 12/2.  DVT prophylaxis: enoxaparin Code Status: Full Family Communication:  Disposition Plan: SNF per PT   Murray Hodgkins, MD  [facility] Direct contact: [phone] --Via amion app OR  --[email or web address]; password [email or web address]  7PM-7AM contact night coverage as above 08/05/2018, 11:10 AM  LOS: 4 days   Consultants:  Orthopedics   Procedures:  INTRAMEDULLARY (IM) NAIL INTERTROCHANTRIC (Left) with Biomet Affixus Nail, 58mm x 318mm  Transfusion 2 units PRBC 11/28.  Antimicrobials:    Interval history/Subjective: Overall feels well.  Objective: Vitals:  Vitals:   08/05/18 0826 08/05/18 0921  BP:  (!) 152/65  Pulse:  (!) 102  Resp:  16  Temp:    SpO2: 94% 93%    Exam:  Constitutional:   . Appears calm and  comfortable ENMT:  . grossly normal hearing  Respiratory:  . CTA bilaterally, no w/r/r.  . Respiratory effort normal.  Cardiovascular:  . RRR, no m/r/g . No LE extremity edema   Psychiatric:  . Mental status o Mood, affect appropriate  I have personally reviewed the following:   Data: . Hemoglobin improved status post 2 units PRBC.  12.4.  Scheduled Meds: . buPROPion  150 mg Oral Daily  . diltiazem  120 mg Oral Daily  . enoxaparin (LOVENOX) injection  30 mg Subcutaneous QHS  . feeding supplement (ENSURE ENLIVE)  237 mL Oral BID BM  . levothyroxine  50 mcg Oral QAC breakfast  . multivitamin with minerals  1 tablet Oral Daily  . pantoprazole  40 mg Oral QAC breakfast  . pravastatin  40 mg Oral q morning - 10a  . senna-docusate  1 tablet Oral BID  . umeclidinium bromide  1 puff Inhalation Daily   Continuous Infusions:   Principal Problem:   Closed left hip fracture, initial encounter ([place]) Active Problems:   COPD Golds D, frequent exacerbations   Hypertension   Hyperlipidemia   Hypothyroidism   Chronic respiratory failure with hypoxia (HCC)   Diastolic dysfunction   Malnutrition of moderate degree   Acute blood loss anemia   LOS: 4 days

## 2018-08-05 NOTE — Plan of Care (Signed)
  Problem: Clinical Measurements: Goal: Will remain free from infection Outcome: Progressing Goal: Respiratory complications will improve Outcome: Progressing   Problem: Activity: Goal: Risk for activity intolerance will decrease Outcome: Progressing   Problem: Nutrition: Goal: Adequate nutrition will be maintained Outcome: Progressing   Problem: Elimination: Goal: Will not experience complications related to urinary retention Outcome: Progressing   Problem: Safety: Goal: Ability to remain free from injury will improve Outcome: Progressing   Problem: Skin Integrity: Goal: Risk for impaired skin integrity will decrease Outcome: Progressing

## 2018-08-06 MED ORDER — ALPRAZOLAM 0.5 MG PO TABS
0.5000 mg | ORAL_TABLET | Freq: Once | ORAL | Status: AC
  Administered 2018-08-06: 0.5 mg via ORAL
  Filled 2018-08-06: qty 1

## 2018-08-06 MED ORDER — ALPRAZOLAM 0.5 MG PO TABS
0.5000 mg | ORAL_TABLET | Freq: Three times a day (TID) | ORAL | Status: DC | PRN
  Administered 2018-08-07 – 2018-08-08 (×5): 0.5 mg via ORAL
  Filled 2018-08-06 (×5): qty 1

## 2018-08-06 NOTE — Progress Notes (Signed)
SPORTS MEDICINE AND JOINT REPLACEMENT  Lara Mulch, MD    Carlyon Shadow, PA-C [address], [place], [place]  [address]                             [phone]   PROGRESS NOTE  Subjective:  Patient sitting up at bedside eating breakfast, pain is mild   Tolerating Diet: yes         Patient reports pain as 2 on 0-10 scale.    Objective: Vital signs in last 24 hours:    Patient Vitals for the past 24 hrs:  BP Temp Temp src Pulse Resp SpO2  08/06/18 0529 (!) 131/59 98.6 F (37 C) Oral 95 - 99 %  08/05/18 1933 (!) 144/69 98.3 F (36.8 C) Oral (!) 113 (!) 22 98 %  08/05/18 1503 (!) 89/76 98 F (36.7 C) Oral (!) 102 18 100 %  08/05/18 1203 - - - - - 99 %  08/05/18 1202 - - - (!) 112 (!) 22 99 %  08/05/18 0921 (!) 152/65 - - (!) 102 16 93 %  08/05/18 0826 - - - - - 94 %  08/05/18 0813 - 97.8 F (36.6 C) Oral - 16 93 %    @flow {1959:LAST@   Intake/Output from previous day:   11/29 0701 - 11/30 0700 In: 480 [P.O.:480] Out: 550 [Urine:550]   Intake/Output this shift:   No intake/output data recorded.   Intake/Output      11/29 0701 - 11/30 0700 11/30 0701 - 12/01 0700   P.O. 480    Total Intake(mL/kg) 480 (10.6)    Urine (mL/kg/hr) 550 (0.5)    Total Output 550    Net -70            LABORATORY DATA: Recent Labs    08/01/18 1949 08/03/18 0336 08/03/18 1354 08/04/18 0237 08/05/18 0123  WBC 14.3* 7.4 11.0* 8.9 10.6*  HGB 11.0* 8.8* 8.9* 7.4* 12.4  HCT 34.9* 29.7* 29.4* 24.9* 37.0  PLT 298 239 236 226 194   Recent Labs    08/01/18 1949 08/02/18 0842 08/03/18 0336 08/04/18 0237  NA 138 142 143 141  K 2.6* 3.7 4.8 4.8  CL 91* 99 110 105  CO2 35* 36* 30 29  BUN 16 14 13 9   CREATININE 1.15* 1.23* 0.93 0.87  GLUCOSE 158* 113* 100* 96  CALCIUM 8.7* 8.8* 8.6*  8.6* 8.7*   Lab Results  Component Value Date   INR 0.97 08/03/2018   INR 0.92 08/01/2018   INR 1.0 09/07/2007    Examination:  General appearance: alert, cooperative and no  distress Extremities: extremities normal, atraumatic, no cyanosis or edema  Wound Exam: clean, dry, intact   Drainage:  None: wound tissue dry  Motor Exam: Quadriceps and Hamstrings Intact  Sensory Exam: Superficial Peroneal, Deep Peroneal and Tibial normal   Assessment:    3 Days Post-Op  Procedure(s) (LRB): INTRAMEDULLARY (IM) NAIL INTERTROCHANTRIC (Left)  ADDITIONAL DIAGNOSIS:  Principal Problem:   Closed left hip fracture, initial encounter (HCC) Active Problems:   COPD Golds D, frequent exacerbations   Hypertension   Hyperlipidemia   Hypothyroidism   Chronic respiratory failure with hypoxia (HCC)   Diastolic dysfunction   Malnutrition of moderate degree   Acute blood loss anemia     Plan: Physical Therapy as ordered Weight Bearing as Tolerated (WBAT)  DVT Prophylaxis:  Lovenox  Advance diet  Up with therapy  Continue  plan per medicine   Donia Ast 08/06/2018, 8:00 AM

## 2018-08-06 NOTE — Progress Notes (Signed)
Physical Therapy Treatment Patient Details Name: [name] MRN: [id number] DOB: 06-Sep-1941 Today's Date: 08/06/2018    History of Present Illness Patient is a 77 y/o female who presents s/p fall and left hip fx now s/p IM nail 11/27. PMh includes CHF, COPD, diastolic dysfunction, HTN, HLD, depression.    PT Comments    Patient seen for mobility progression. Pt requires mod/max A for bed mobility and functional transfers this session. Continue to progress as tolerated with anticipated d/c to SNF for further skilled PT services.     Follow Up Recommendations  SNF;Supervision for mobility/OOB     Equipment Recommendations  None recommended by PT    Recommendations for Other Services       Precautions / Restrictions Precautions Precautions: Fall Restrictions Weight Bearing Restrictions: Yes LLE Weight Bearing: Partial weight bearing LLE Partial Weight Bearing Percentage or Pounds: 50    Mobility  Bed Mobility Overal bed mobility: Needs Assistance Bed Mobility: Supine to Sit     Supine to sit: HOB elevated;Max assist     General bed mobility comments: assist to bring L LE and hips to EOB and to elevate trunk into sitting; cues for sequencing and use of rails  Transfers Overall transfer level: Needs assistance Equipment used: Rolling walker (2 wheeled) Transfers: Sit to/from Stand Sit to Stand: Max assist;From elevated surface Stand pivot transfers: Mod assist       General transfer comment: max A to power up into standing X 2 trials; pt stood very briefly initial stand due to pain intolerance; pt agreeable to attempt again and pt able to stand pivot to recliner with assistance for balance and guiding RW   Ambulation/Gait                 Stairs             Wheelchair Mobility    Modified Rankin (Stroke Patients Only)       Balance Overall balance assessment: Needs assistance;History of Falls Sitting-balance support: Feet  supported;Bilateral upper extremity supported Sitting balance-Leahy Scale: Poor Sitting balance - Comments: R lateral lean away from painful hip   Standing balance support: During functional activity;Bilateral upper extremity supported Standing balance-Leahy Scale: Poor                              Cognition Arousal/Alertness: Awake/alert Behavior During Therapy: WFL for tasks assessed/performed Overall Cognitive Status: History of cognitive impairments - at baseline                                 General Comments: A&Ox4 today      Exercises General Exercises - Lower Extremity Ankle Circles/Pumps: AROM;Both Quad Sets: AROM;Both Short Arc Quad: AROM;Left Heel Slides: AAROM;Left Hip ABduction/ADduction: AAROM;Left    General Comments        Pertinent Vitals/Pain Pain Assessment: Faces Faces Pain Scale: Hurts even more Pain Location: LLE with weight bearing  Pain Descriptors / Indicators: Sore;Guarding;Grimacing Pain Intervention(s): Limited activity within patient's tolerance;Monitored during session;Repositioned    Home Living                      Prior Function            PT Goals (current goals can now be found in the care plan section) Acute Rehab PT Goals Patient Stated Goal: to get better Progress towards  PT goals: Progressing toward goals    Frequency    Min 3X/week      PT Plan Current plan remains appropriate    Co-evaluation              AM-PAC PT "6 Clicks" Mobility   Outcome Measure  Help needed turning from your back to your side while in a flat bed without using bedrails?: A Lot Help needed moving from lying on your back to sitting on the side of a flat bed without using bedrails?: A Lot Help needed moving to and from a bed to a chair (including a wheelchair)?: A Lot Help needed standing up from a chair using your arms (e.g., wheelchair or bedside chair)?: A Lot Help needed to walk in hospital  room?: Total Help needed climbing 3-5 steps with a railing? : Total 6 Click Score: 10    End of Session Equipment Utilized During Treatment: Gait belt;Oxygen Activity Tolerance: Patient tolerated treatment well Patient left: in chair;with call bell/phone within reach;with chair alarm set Nurse Communication: Mobility status PT Visit Diagnosis: Muscle weakness (generalized) (M62.81);Pain;Difficulty in walking, not elsewhere classified (R26.2) Pain - Right/Left: Left Pain - part of body: Leg     Time: 6440-3474 PT Time Calculation (min) (ACUTE ONLY): 30 min  Charges:  $Therapeutic Exercise: 8-22 mins $Therapeutic Activity: 8-22 mins                     [name], PTA Acute Rehabilitation Services Pager: [phone] Office: [phone]     Darliss Cheney 08/06/2018, 1:33 PM

## 2018-08-06 NOTE — Progress Notes (Signed)
Pt becoming more confused. Pt's family at bedside unable to redirect pt. Pt convinced that she is in the doctors office and needs to go home. Pt refuses to believe family or RN regarding the situation. Paged Dr Sarajane Jews regarding pt's continued confusion. Family requesting to give pt additional medication to calm her down as they are unable to spend the night with the pt. Awaiting callback from MD.

## 2018-08-06 NOTE — Progress Notes (Signed)
Pt [name] that she is in the dr's office and needs to go home. RN attempted to redirect pt and she refused to believe RN. Pt attempting to get out of bed multiple times. RN was able to contact pt's family member Jocelyn Lamer and she was able to redirect pt.

## 2018-08-06 NOTE — Progress Notes (Signed)
  PROGRESS NOTE  CASIDY ALBERTA FFM:[id number] DOB: 1941/06/06 DOA: 08/01/2018 PCP: [name], MD  Brief Narrative: 77 year old woman PMH diastolic CHF, COPD, hypothyroidism presented after mechanical fall at home resulting in left hip pain.  Imaging confirmed comminuted, displaced left intertrochanteric fracture.  Assessment/Plan Left intertrochanteric hip fracture.  Status post surgery 11/27. --Continues to do well.  Continue physical therapy, weight-bear as tolerated.  DVT prophylaxis Lovenox for 30 days  Acute blood loss anemia secondary to fracture, surgery. Hemoglobin improved appropriately status post transfusion 2 units PRBC.    COPD, chronic hypoxic respiratory failure on home oxygen --Remains stable.  Continue supplemental oxygen and bronchodilators.  Chronic diastolic CHF --Compensated.  Continue diltiazem, torsemide.  Hypothyroidism --Continue levothyroxine  Essential hypertension --Stable.  Continue diltiazem and torsemide.  Moderate malnutrition --Continue Ensure, Magic cup, multivitamin per dietitian   Remains stable for discharge.  Needs PASSAR number, will not be available until 12/2.  DVT prophylaxis: enoxaparin Code Status: Full Family Communication:  Disposition Plan: SNF per PT   Murray Hodgkins, MD  [facility] Direct contact: [phone] --Via amion app OR  --[email or web address]; password [email or web address]  7PM-7AM contact night coverage as above 08/06/2018, 12:59 PM  LOS: 5 days   Consultants:  Orthopedics   Procedures:  INTRAMEDULLARY (IM) NAIL INTERTROCHANTRIC (Left) with Biomet Affixus Nail, 55mm x 367mm  Transfusion 2 units PRBC 11/28.  Antimicrobials:    Interval history/Subjective: Feels all right, breathing at baseline.  Minimal pain.  Objective: Vitals:  Vitals:   08/06/18 0529 08/06/18 1237  BP: (!) 131/59 (!) 149/76  Pulse: 95 (!) 109  Resp:  18  Temp: 98.6 F (37 C) 98.3 F (36.8 C)  SpO2: 99% 100%     Exam:  Constitutional:   . Appears calm and comfortable Respiratory:  . CTA bilaterally, no w/r/r.  . Respiratory effort normal.  Cardiovascular:  . RRR, no m/r/g . No LE extremity edema    Musculoskeletal.  Moves both feet to command. Psychiatric:  . Mental status o Mood, affect appropriate  I have personally reviewed the following:   Data: . No labs today  Scheduled Meds: . buPROPion  150 mg Oral Daily  . diltiazem  120 mg Oral Daily  . enoxaparin (LOVENOX) injection  30 mg Subcutaneous QHS  . feeding supplement (ENSURE ENLIVE)  237 mL Oral BID BM  . levothyroxine  50 mcg Oral QAC breakfast  . multivitamin with minerals  1 tablet Oral Daily  . pantoprazole  40 mg Oral QAC breakfast  . pravastatin  40 mg Oral q morning - 10a  . senna-docusate  1 tablet Oral BID  . torsemide  20 mg Oral Daily  . umeclidinium bromide  1 puff Inhalation Daily   Continuous Infusions:   Principal Problem:   Closed left hip fracture, initial encounter ([place]) Active Problems:   COPD Golds D, frequent exacerbations   Hypertension   Hyperlipidemia   Hypothyroidism   Chronic respiratory failure with hypoxia (HCC)   Diastolic dysfunction   Malnutrition of moderate degree   Acute blood loss anemia   LOS: 5 days

## 2018-08-06 NOTE — Progress Notes (Signed)
Pt's bed alarm going off. Found pt standing at side of bed. Placed pt back in bed with assistance of NT. Bed alarm and fall mats in place. Pt unable to be convinced she is in the hospital. Pt did assure RN that she would "just lie here and be quiet". Belongings within reach, call bell in reach, bed locked, lowest position, bed alarm on.

## 2018-08-07 MED ORDER — MORPHINE SULFATE (PF) 2 MG/ML IV SOLN
2.0000 mg | INTRAVENOUS | Status: DC | PRN
  Administered 2018-08-07: 2 mg via INTRAVENOUS
  Filled 2018-08-07: qty 1

## 2018-08-07 MED ORDER — HYDROCORTISONE 1 % EX CREA
TOPICAL_CREAM | Freq: Three times a day (TID) | CUTANEOUS | Status: DC | PRN
  Filled 2018-08-07: qty 28

## 2018-08-07 NOTE — Progress Notes (Signed)
RN approached by Claudell Kyle, a respiratory therapist of [facility], who identifies herself as the patient's son's fiancee. Informs RN that patient is upset and confused and called her son Amandeep Hogston seeking help to leave on tonight. Patient is uncooperative actively attempting to get out of bed.  Joelene Millin came to the unit to check out the situation and relayed that Catalina Antigua is upset because he reports that his mother has not been receiving adequate care since her admission. Catalina Antigua states that he has relayed to the doctor upon admission that his mother is on Hospice at home and receives Morphine and Xanxfor anxiety. Xanax was ordered but Morphine was not ordered. Matt reports that he has asked RN's for 3 consecutive days and nights to contact the doctor to request Morphine be added to the medication regime with no success.   RN calls Maura Braaten who was described at bedside report as the patient's daughter and healthcare power of attorney. Jocelyn Lamer informed RN that Morphine does not help her mother and neither does Xanax. However she agreed that if the on-call doctor deemed it appropriate to prescribed Morphine on tonight then she agrees to the plan of care.   Matt calls RN and states that he is the healthcare power of attorney and not his sister Jocelyn Lamer and that he will bring supporting documentation to the hospital during his next visit.  RN called [facility] and received order for Morphine 2 mg PRN q3. RN administered medication to patient. Patient was able to get some sleep on tonight. Nursing will continue to monitor.

## 2018-08-07 NOTE — Progress Notes (Signed)
  PROGRESS NOTE  [name] YIA:[id number] DOB: 1941/08/20 DOA: 08/01/2018 PCP: [name], MD  Brief Narrative: 77 year old woman PMH diastolic CHF, COPD, hypothyroidism presented after mechanical fall at home resulting in left hip pain.  Imaging confirmed comminuted, displaced left intertrochanteric fracture.  Assessment/Plan Left intertrochanteric hip fracture.  Status post surgery 11/27. --Doing well, continue physical therapy, weight-bear as tolerated.  DVT prophylaxis Lovenox for 30 days  Acute blood loss anemia secondary to fracture, surgery. Hemoglobin improved appropriately status post transfusion 2 units PRBC.    COPD, chronic hypoxic respiratory failure on home oxygen --Stable.  Continue supplemental oxygen and bronchodilators.  Chronic diastolic CHF --Appears compensated.  Continue diltiazem, torsemide.  Hypothyroidism --Continue levothyroxine  Essential hypertension --Remains stable.  Continue diltiazem and torsemide.  Moderate malnutrition --Continue Ensure, Magic cup, multivitamin per dietitian   Remains stable for discharge.  Needs PASSAR number, will not be available until 12/2.  DVT prophylaxis: enoxaparin Code Status: Full Family Communication:  Disposition Plan: SNF per PT   Murray Hodgkins, MD  [facility] Direct contact: [phone] --Via amion app OR  --[email or web address]; password [email or web address]  7PM-7AM contact night coverage as above 08/07/2018, 1:57 PM  LOS: 6 days   Consultants:  Orthopedics   Procedures:  INTRAMEDULLARY (IM) NAIL INTERTROCHANTRIC (Left) with Biomet Affixus Nail, 62mm x 37mm  Transfusion 2 units PRBC 11/28.  Antimicrobials:    Interval history/Subjective: Was confused yesterday afternoon but currently doing well.  Breathing fine.  No complaints.  Objective: Vitals:  Vitals:   08/07/18 0410 08/07/18 1311  BP: (!) 142/56 (!) 126/56  Pulse: 85 98  Resp: 16 18  Temp: 97.8 F (36.6 C) 98.2 F (36.8 C)  SpO2:  98% 98%    Exam:  Constitutional:   . Appears calm and comfortable Respiratory:  . CTA bilaterally, no w/r/r.  . Respiratory effort normal.  Cardiovascular:  . RRR, no m/r/g . No LE extremity edema   Musculoskeletal:  . RLE, LLE   . Moves both legs to command Skin:  . No rashes, lesions, ulcers noted over the legs. Psychiatric:  . Mental status o Mood, affect appropriate  I have personally reviewed the following:   Data: . No labs today  Scheduled Meds: . buPROPion  150 mg Oral Daily  . diltiazem  120 mg Oral Daily  . enoxaparin (LOVENOX) injection  30 mg Subcutaneous QHS  . feeding supplement (ENSURE ENLIVE)  237 mL Oral BID BM  . levothyroxine  50 mcg Oral QAC breakfast  . multivitamin with minerals  1 tablet Oral Daily  . pantoprazole  40 mg Oral QAC breakfast  . pravastatin  40 mg Oral q morning - 10a  . senna-docusate  1 tablet Oral BID  . torsemide  20 mg Oral Daily  . umeclidinium bromide  1 puff Inhalation Daily   Continuous Infusions:   Principal Problem:   Closed left hip fracture, initial encounter ([place]) Active Problems:   COPD Golds D, frequent exacerbations   Hypertension   Hyperlipidemia   Hypothyroidism   Chronic respiratory failure with hypoxia (HCC)   Diastolic dysfunction   Malnutrition of moderate degree   Acute blood loss anemia   LOS: 6 days

## 2018-08-07 NOTE — Plan of Care (Signed)
  Problem: Clinical Measurements: Goal: Ability to maintain clinical measurements within normal limits will improve Outcome: Progressing   Problem: Nutrition: Goal: Adequate nutrition will be maintained Outcome: Progressing   Problem: Coping: Goal: Level of anxiety will decrease Outcome: Progressing   Problem: Safety: Goal: Ability to remain free from injury will improve Outcome: Progressing Note:  Pt remains free of injury. Bed locked and in lowest position. Call bell within reach. Bed alarm on and floor mats in place.    Problem: Skin Integrity: Goal: Risk for impaired skin integrity will decrease Outcome: Progressing

## 2018-08-08 DIAGNOSIS — E440 Moderate protein-calorie malnutrition: Secondary | ICD-10-CM | POA: Diagnosis present

## 2018-08-08 DIAGNOSIS — R296 Repeated falls: Secondary | ICD-10-CM | POA: Diagnosis not present

## 2018-08-08 DIAGNOSIS — E039 Hypothyroidism, unspecified: Secondary | ICD-10-CM | POA: Diagnosis present

## 2018-08-08 DIAGNOSIS — S72122A Displaced fracture of lesser trochanter of left femur, initial encounter for closed fracture: Secondary | ICD-10-CM | POA: Diagnosis not present

## 2018-08-08 DIAGNOSIS — S72002A Fracture of unspecified part of neck of left femur, initial encounter for closed fracture: Secondary | ICD-10-CM | POA: Diagnosis not present

## 2018-08-08 DIAGNOSIS — M2550 Pain in unspecified joint: Secondary | ICD-10-CM | POA: Diagnosis not present

## 2018-08-08 DIAGNOSIS — I5032 Chronic diastolic (congestive) heart failure: Secondary | ICD-10-CM | POA: Diagnosis present

## 2018-08-08 DIAGNOSIS — F0390 Unspecified dementia without behavioral disturbance: Secondary | ICD-10-CM | POA: Diagnosis present

## 2018-08-08 DIAGNOSIS — J15212 Pneumonia due to Methicillin resistant Staphylococcus aureus: Secondary | ICD-10-CM | POA: Diagnosis present

## 2018-08-08 DIAGNOSIS — Z96642 Presence of left artificial hip joint: Secondary | ICD-10-CM | POA: Diagnosis present

## 2018-08-08 DIAGNOSIS — S72142D Displaced intertrochanteric fracture of left femur, subsequent encounter for closed fracture with routine healing: Secondary | ICD-10-CM | POA: Diagnosis not present

## 2018-08-08 DIAGNOSIS — D62 Acute posthemorrhagic anemia: Secondary | ICD-10-CM | POA: Diagnosis not present

## 2018-08-08 DIAGNOSIS — F419 Anxiety disorder, unspecified: Secondary | ICD-10-CM | POA: Diagnosis not present

## 2018-08-08 DIAGNOSIS — Z9981 Dependence on supplemental oxygen: Secondary | ICD-10-CM | POA: Diagnosis not present

## 2018-08-08 DIAGNOSIS — F0391 Unspecified dementia with behavioral disturbance: Secondary | ICD-10-CM | POA: Diagnosis not present

## 2018-08-08 DIAGNOSIS — R52 Pain, unspecified: Secondary | ICD-10-CM | POA: Diagnosis not present

## 2018-08-08 DIAGNOSIS — E876 Hypokalemia: Secondary | ICD-10-CM | POA: Diagnosis present

## 2018-08-08 DIAGNOSIS — F339 Major depressive disorder, recurrent, unspecified: Secondary | ICD-10-CM | POA: Diagnosis not present

## 2018-08-08 DIAGNOSIS — Z6820 Body mass index (BMI) 20.0-20.9, adult: Secondary | ICD-10-CM | POA: Diagnosis not present

## 2018-08-08 DIAGNOSIS — J449 Chronic obstructive pulmonary disease, unspecified: Secondary | ICD-10-CM | POA: Diagnosis not present

## 2018-08-08 DIAGNOSIS — J189 Pneumonia, unspecified organism: Secondary | ICD-10-CM | POA: Diagnosis not present

## 2018-08-08 DIAGNOSIS — Z4789 Encounter for other orthopedic aftercare: Secondary | ICD-10-CM | POA: Diagnosis not present

## 2018-08-08 DIAGNOSIS — N63 Unspecified lump in unspecified breast: Secondary | ICD-10-CM | POA: Diagnosis present

## 2018-08-08 DIAGNOSIS — I110 Hypertensive heart disease with heart failure: Secondary | ICD-10-CM | POA: Diagnosis present

## 2018-08-08 DIAGNOSIS — J209 Acute bronchitis, unspecified: Secondary | ICD-10-CM | POA: Diagnosis not present

## 2018-08-08 DIAGNOSIS — Z8781 Personal history of (healed) traumatic fracture: Secondary | ICD-10-CM | POA: Diagnosis not present

## 2018-08-08 DIAGNOSIS — I10 Essential (primary) hypertension: Secondary | ICD-10-CM | POA: Diagnosis not present

## 2018-08-08 DIAGNOSIS — I5022 Chronic systolic (congestive) heart failure: Secondary | ICD-10-CM | POA: Diagnosis not present

## 2018-08-08 DIAGNOSIS — Z780 Asymptomatic menopausal state: Secondary | ICD-10-CM | POA: Diagnosis not present

## 2018-08-08 DIAGNOSIS — S7290XA Unspecified fracture of unspecified femur, initial encounter for closed fracture: Secondary | ICD-10-CM | POA: Diagnosis not present

## 2018-08-08 DIAGNOSIS — R262 Difficulty in walking, not elsewhere classified: Secondary | ICD-10-CM | POA: Diagnosis not present

## 2018-08-08 DIAGNOSIS — Z681 Body mass index (BMI) 19 or less, adult: Secondary | ICD-10-CM | POA: Diagnosis not present

## 2018-08-08 DIAGNOSIS — J9691 Respiratory failure, unspecified with hypoxia: Secondary | ICD-10-CM | POA: Diagnosis not present

## 2018-08-08 DIAGNOSIS — F411 Generalized anxiety disorder: Secondary | ICD-10-CM | POA: Diagnosis present

## 2018-08-08 DIAGNOSIS — M6281 Muscle weakness (generalized): Secondary | ICD-10-CM | POA: Diagnosis not present

## 2018-08-08 DIAGNOSIS — S79929A Unspecified injury of unspecified thigh, initial encounter: Secondary | ICD-10-CM | POA: Diagnosis not present

## 2018-08-08 DIAGNOSIS — R279 Unspecified lack of coordination: Secondary | ICD-10-CM | POA: Diagnosis not present

## 2018-08-08 DIAGNOSIS — Z7952 Long term (current) use of systemic steroids: Secondary | ICD-10-CM | POA: Diagnosis not present

## 2018-08-08 DIAGNOSIS — N959 Unspecified menopausal and perimenopausal disorder: Secondary | ICD-10-CM | POA: Diagnosis present

## 2018-08-08 DIAGNOSIS — W19XXXA Unspecified fall, initial encounter: Secondary | ICD-10-CM | POA: Diagnosis not present

## 2018-08-08 DIAGNOSIS — F17210 Nicotine dependence, cigarettes, uncomplicated: Secondary | ICD-10-CM | POA: Diagnosis present

## 2018-08-08 DIAGNOSIS — W06XXXA Fall from bed, initial encounter: Secondary | ICD-10-CM | POA: Diagnosis not present

## 2018-08-08 DIAGNOSIS — S299XXA Unspecified injury of thorax, initial encounter: Secondary | ICD-10-CM | POA: Diagnosis not present

## 2018-08-08 DIAGNOSIS — I700 Atherosclerosis of aorta: Secondary | ICD-10-CM | POA: Diagnosis present

## 2018-08-08 DIAGNOSIS — E038 Other specified hypothyroidism: Secondary | ICD-10-CM | POA: Diagnosis not present

## 2018-08-08 DIAGNOSIS — I5030 Unspecified diastolic (congestive) heart failure: Secondary | ICD-10-CM | POA: Diagnosis not present

## 2018-08-08 DIAGNOSIS — J440 Chronic obstructive pulmonary disease with acute lower respiratory infection: Secondary | ICD-10-CM | POA: Diagnosis present

## 2018-08-08 DIAGNOSIS — R1310 Dysphagia, unspecified: Secondary | ICD-10-CM | POA: Diagnosis not present

## 2018-08-08 DIAGNOSIS — R042 Hemoptysis: Secondary | ICD-10-CM | POA: Diagnosis present

## 2018-08-08 DIAGNOSIS — Z886 Allergy status to analgesic agent status: Secondary | ICD-10-CM | POA: Diagnosis not present

## 2018-08-08 DIAGNOSIS — Z743 Need for continuous supervision: Secondary | ICD-10-CM | POA: Diagnosis not present

## 2018-08-08 DIAGNOSIS — R5381 Other malaise: Secondary | ICD-10-CM | POA: Diagnosis not present

## 2018-08-08 DIAGNOSIS — S72112A Displaced fracture of greater trochanter of left femur, initial encounter for closed fracture: Secondary | ICD-10-CM | POA: Diagnosis not present

## 2018-08-08 DIAGNOSIS — B370 Candidal stomatitis: Secondary | ICD-10-CM | POA: Diagnosis not present

## 2018-08-08 DIAGNOSIS — J9611 Chronic respiratory failure with hypoxia: Secondary | ICD-10-CM | POA: Diagnosis not present

## 2018-08-08 DIAGNOSIS — D6489 Other specified anemias: Secondary | ICD-10-CM | POA: Diagnosis not present

## 2018-08-08 DIAGNOSIS — Z87891 Personal history of nicotine dependence: Secondary | ICD-10-CM | POA: Diagnosis not present

## 2018-08-08 DIAGNOSIS — E785 Hyperlipidemia, unspecified: Secondary | ICD-10-CM | POA: Diagnosis present

## 2018-08-08 DIAGNOSIS — N390 Urinary tract infection, site not specified: Secondary | ICD-10-CM | POA: Diagnosis present

## 2018-08-08 DIAGNOSIS — Z7401 Bed confinement status: Secondary | ICD-10-CM | POA: Diagnosis not present

## 2018-08-08 DIAGNOSIS — S3210XD Unspecified fracture of sacrum, subsequent encounter for fracture with routine healing: Secondary | ICD-10-CM | POA: Diagnosis not present

## 2018-08-08 DIAGNOSIS — Z79899 Other long term (current) drug therapy: Secondary | ICD-10-CM | POA: Diagnosis not present

## 2018-08-08 DIAGNOSIS — Z90710 Acquired absence of both cervix and uterus: Secondary | ICD-10-CM | POA: Diagnosis not present

## 2018-08-08 MED ORDER — MORPHINE SULFATE (CONCENTRATE) 10 MG /0.5 ML PO SOLN: 5.0000 mg | Freq: Four times a day (QID) | ORAL | 0 refills | Status: DC | PRN

## 2018-08-08 MED ORDER — ENOXAPARIN SODIUM 30 MG/0.3ML ~~LOC~~ SOLN: 30.0000 mg | Freq: Every day | SUBCUTANEOUS | Status: DC

## 2018-08-08 MED ORDER — OXYCODONE HCL 5 MG PO TABS: 5.0000 mg | ORAL_TABLET | ORAL | 0 refills | Status: DC | PRN

## 2018-08-08 MED ORDER — OXYCODONE HCL 5 MG PO TABS: 5.0000 mg | ORAL_TABLET | Freq: Four times a day (QID) | ORAL | 0 refills | Status: DC | PRN

## 2018-08-08 MED ORDER — ENSURE ENLIVE PO LIQD: 237.0000 mL | Freq: Two times a day (BID) | ORAL | Status: DC

## 2018-08-08 MED ORDER — ALPRAZOLAM 0.5 MG PO TABS: 0.5000 mg | ORAL_TABLET | Freq: Three times a day (TID) | ORAL | 0 refills | Status: DC | PRN

## 2018-08-08 MED ORDER — BISACODYL 10 MG RE SUPP: 10.0000 mg | Freq: Once | RECTAL | Status: DC

## 2018-08-08 MED ORDER — POLYETHYLENE GLYCOL 3350 17 G PO PACK: 17.0000 g | PACK | Freq: Every day | ORAL | Status: DC | PRN

## 2018-08-08 MED ORDER — POLYETHYLENE GLYCOL 3350 17 G PO PACK: 17.0000 g | PACK | Freq: Two times a day (BID) | ORAL | Status: DC

## 2018-08-08 NOTE — Progress Notes (Signed)
Report was called to [facility] for discharge instructions.  Report given to Mariann Laster (Nurse)  Discharge information packet will be sent via [place] arrival.

## 2018-08-08 NOTE — Clinical Social Work Placement (Signed)
   CLINICAL SOCIAL WORK PLACEMENT  NOTE  Date:  08/08/2018  Patient Details  Name: [name] MRN: [id number] Date of Birth: Oct 27, 1940  Clinical Social Work is seeking post-discharge placement for this patient at the [place] level of care (*CSW will initial, date and re-position this form in  chart as items are completed):      Patient/family provided with [place] Work Department's list of facilities offering this level of care within the geographic area requested by the patient (or if unable, by the patient's family).  Yes   Patient/family informed of their freedom to choose among providers that offer the needed level of care, that participate in Medicare, Medicaid or managed care program needed by the patient, have an available bed and are willing to accept the patient.      Patient/family informed of [place] [facility]'s ownership interest in [facility] and [facility], as well as of the fact that they are under no obligation to receive care at these facilities.  PASRR submitted to EDS on       PASRR number received on 08/05/18     Existing PASRR number confirmed on       FL2 transmitted to all facilities in geographic area requested by pt/family on 08/05/18     FL2 transmitted to all facilities within larger geographic area on       Patient informed that his/her managed care company has contracts with or will negotiate with certain facilities, including the following:        Yes   Patient/family informed of bed offers received.  Patient chooses bed at Other - please specify in the comment section below:([facility])     Physician recommends and patient chooses bed at      Patient to be transferred to Other - please specify in the comment section below:([facility]) on 08/08/18.  Patient to be transferred to facility by PTAR     Patient family notified on 08/08/18 of transfer.  Name of family member notified:  Alphonsus Sias      PHYSICIAN       Additional Comment:    _______________________________________________ [name], LCSW 08/08/2018, 3:33 PM

## 2018-08-08 NOTE — Progress Notes (Signed)
PASRR# Received  :[id number] [place], [place]

## 2018-08-08 NOTE — Progress Notes (Addendum)
Occupational Therapy Treatment Patient Details Name: [name] MRN: [id number] DOB: [birth year] Today's Date: 08/08/2018    History of present illness Patient is a 77 y/o female who presents s/p fall and left hip fx now s/p IM nail 11/27. PMh includes CHF, COPD, diastolic dysfunction, HTN, HLD, depression.   OT comments  Pt progressing towards OT goals, presents sitting up in recliner pleasant and willing to participate in therapy session. Pt with improvements in functional mobility, able to perform short distance this session using RW with two person assist. Pt requires consistent cues and use of demonstration to educate and reinforce PWB status with improved carryover noted during mobility. Feel SNF recommendation remains appropriate at this time. Will continue to follow acutely to progress pt towards established OT goals.    Follow Up Recommendations  SNF;Supervision/Assistance - 24 hour    Equipment Recommendations  Other (comment)(TBD in next venue)          Precautions / Restrictions Precautions Precautions: Fall Restrictions Weight Bearing Restrictions: Yes LLE Weight Bearing: Partial weight bearing LLE Partial Weight Bearing Percentage or Pounds: 50       Mobility Bed Mobility               General bed mobility comments: OOB in recliner upon arrival  Transfers Overall transfer level: Needs assistance Equipment used: Rolling walker (2 wheeled) Transfers: Sit to/from Stand Sit to Stand: Max assist;Mod assist;+2 physical assistance;+2 safety/equipment         General transfer comment: mod-maxA+2 to power up from recliner with cues for hand/LE placement and cues for maintaining PWB status, completed sit<>stand x2 during session from recliner    Balance Overall balance assessment: Needs assistance;History of Falls Sitting-balance support: Feet supported;Bilateral upper extremity supported Sitting balance-Leahy Scale: Fair     Standing balance support:  During functional activity;Bilateral upper extremity supported Standing balance-Leahy Scale: Poor Standing balance comment: Reliant on UEs and external support for standing due to WB status                           ADL either performed or assessed with clinical judgement   ADL Overall ADL's : Needs assistance/impaired                     Lower Body Dressing: Maximal assistance;+2 for physical assistance;+2 for safety/equipment;Sit to/from stand               Functional mobility during ADLs: Moderate assistance;+2 for physical assistance;+2 for safety/equipment;Rolling walker General ADL Comments: focus of session on increasing tolerance to functional mobility; pt with increased distance this session, requires demonstration of techniques for mobility while maintaining PWB to increase carryover     Vision       Perception     Praxis      Cognition Arousal/Alertness: Awake/alert Behavior During Therapy: WFL for tasks assessed/performed Overall Cognitive Status: History of cognitive impairments - at baseline                                 General Comments: pt unable to tell therapists why she is in hospital; decreased STM noted during session        Exercises     Shoulder Instructions       General Comments      Pertinent Vitals/ Pain       Pain Assessment: Faces Faces Pain Scale:  Hurts a little bit Pain Location: LLE with weight bearing  Pain Descriptors / Indicators: Sore;Guarding;Grimacing Pain Intervention(s): Monitored during session;Repositioned;Limited activity within patient's tolerance  Home Living                                          Prior Functioning/Environment              Frequency  Min 2X/week        Progress Toward Goals  OT Goals(current goals can now be found in the care plan section)  Progress towards OT goals: Progressing toward goals  Acute Rehab OT Goals Patient  Stated Goal: to get better OT Goal Formulation: With patient Time For Goal Achievement: 08/19/18 Potential to Achieve Goals: Good  Plan Discharge plan remains appropriate    Co-evaluation    PT/OT/SLP Co-Evaluation/Treatment: Yes Reason for Co-Treatment: To address functional/ADL transfers;For patient/therapist safety   OT goals addressed during session: Proper use of [employer] equipment and DME      AM-PAC OT "6 Clicks" Daily Activity     Outcome Measure   Help from another person eating meals?: None Help from another person taking care of personal grooming?: A Little Help from another person toileting, which includes using toliet, bedpan, or urinal?: A Lot Help from another person bathing (including washing, rinsing, drying)?: A Lot Help from another person to put on and taking off regular upper body clothing?: A Little Help from another person to put on and taking off regular lower body clothing?: A Lot 6 Click Score: 16    End of Session Equipment Utilized During Treatment: Gait belt;Rolling walker;Oxygen  OT Visit Diagnosis: Unsteadiness on feet (R26.81);Other abnormalities of gait and mobility (R26.89);History of falling (Z91.81);Pain Pain - Right/Left: Left Pain - part of body: Leg   Activity Tolerance Patient tolerated treatment well   Patient Left in chair;with call bell/phone within reach;with family/visitor present   Nurse Communication Mobility status;Weight bearing status        Time: 9169-4503 OT Time Calculation (min): 29 min  Charges: OT General Charges $OT Visit: 1 Visit OT Treatments $Therapeutic Activity: 8-22 mins  Lou Cal, OT Supplemental Rehabilitation Services Pager [phone] Office [phone]    Raymondo Band 08/08/2018, 1:06 PM

## 2018-08-08 NOTE — Progress Notes (Signed)
Physical Therapy Treatment Patient Details Name: [name] MRN: [id number] DOB: June 07, 1941 Today's Date: 08/08/2018    History of Present Illness Patient is a 77 y/o female who presents s/p fall and left hip fx now s/p IM nail 11/27. PMh includes CHF, COPD, diastolic dysfunction, HTN, HLD, depression.    PT Comments    Patient is making progress toward PT goals and tolerated short distance gait training this session with +2 for safety. Pt requires max verbal and visual cues for maintaining weight bearing precautions in standing. Continue to progress as tolerated with anticipated d/c to SNF for further skilled PT services.     Follow Up Recommendations  SNF;Supervision for mobility/OOB     Equipment Recommendations  None recommended by PT    Recommendations for Other Services       Precautions / Restrictions Precautions Precautions: Fall Restrictions Weight Bearing Restrictions: Yes LLE Weight Bearing: Partial weight bearing LLE Partial Weight Bearing Percentage or Pounds: 50    Mobility  Bed Mobility               General bed mobility comments: OOB in recliner upon arrival  Transfers Overall transfer level: Needs assistance Equipment used: Rolling walker (2 wheeled) Transfers: Sit to/from Stand Sit to Stand: Max assist;Mod assist;+2 physical assistance;+2 safety/equipment         General transfer comment: mod-maxA+2 to power up from recliner with cues for hand/LE placement and cues for maintaining PWB status, completed sit<>stand x2 during session from recliner  Ambulation/Gait Ambulation/Gait assistance: Mod assist;+2 safety/equipment Gait Distance (Feet): 10 Feet Assistive device: Rolling walker (2 wheeled) Gait Pattern/deviations: Step-to pattern;Decreased stance time - left;Decreased step length - right;Decreased step length - left;Decreased weight shift to left Gait velocity: decreased   General Gait Details: cues for sequencing, posture, and  maintaining weight bearing precautions; pt requires verbal and visual cues    Stairs             Wheelchair Mobility    Modified Rankin (Stroke Patients Only)       Balance Overall balance assessment: Needs assistance;History of Falls Sitting-balance support: Feet supported;Bilateral upper extremity supported Sitting balance-Leahy Scale: Fair     Standing balance support: During functional activity;Bilateral upper extremity supported Standing balance-Leahy Scale: Poor Standing balance comment: Reliant on UEs and external support for standing due to WB status                            Cognition Arousal/Alertness: Awake/alert Behavior During Therapy: WFL for tasks assessed/performed Overall Cognitive Status: History of cognitive impairments - at baseline                                 General Comments: pt unable to tell therapists why she is in hospital; decreased STM noted during session      Exercises      General Comments        Pertinent Vitals/Pain Pain Assessment: Faces Faces Pain Scale: Hurts a little bit Pain Location: LLE with weight bearing  Pain Descriptors / Indicators: Sore;Guarding;Grimacing Pain Intervention(s): Monitored during session;Limited activity within patient's tolerance;Repositioned    Home Living                      Prior Function            PT Goals (current goals can now be found in  the care plan section) Acute Rehab PT Goals Patient Stated Goal: to get better Progress towards PT goals: Progressing toward goals    Frequency    Min 3X/week      PT Plan Current plan remains appropriate    Co-evaluation PT/OT/SLP Co-Evaluation/Treatment: Yes Reason for Co-Treatment: For patient/therapist safety;To address functional/ADL transfers PT goals addressed during session: Mobility/safety with mobility;Proper use of DME;Strengthening/ROM OT goals addressed during session: Proper use of  Adaptive equipment and DME      AM-PAC PT "6 Clicks" Mobility   Outcome Measure  Help needed turning from your back to your side while in a flat bed without using bedrails?: A Lot Help needed moving from lying on your back to sitting on the side of a flat bed without using bedrails?: A Lot Help needed moving to and from a bed to a chair (including a wheelchair)?: A Lot Help needed standing up from a chair using your arms (e.g., wheelchair or bedside chair)?: A Lot Help needed to walk in hospital room?: A Lot Help needed climbing 3-5 steps with a railing? : Total 6 Click Score: 11    End of Session Equipment Utilized During Treatment: Gait belt;Oxygen Activity Tolerance: Patient tolerated treatment well Patient left: in chair;with call bell/phone within reach;with chair alarm set Nurse Communication: Mobility status PT Visit Diagnosis: Muscle weakness (generalized) (M62.81);Pain;Difficulty in walking, not elsewhere classified (R26.2) Pain - Right/Left: Left Pain - part of body: Leg     Time: 1194-1740 PT Time Calculation (min) (ACUTE ONLY): 32 min  Charges:  $Gait Training: 8-22 mins                     Earney Navy, PTA Acute Rehabilitation Services Pager: [phone] Office: [phone]     Darliss Cheney 08/08/2018, 1:43 PM

## 2018-08-08 NOTE — Progress Notes (Signed)
[facility] has arrived to transport patient to [facility].  Patient is alert, oriented to self, forgetful.  Vitals stable. Patient's son is at the bedside.  No PRN medications were required.

## 2018-08-08 NOTE — Progress Notes (Signed)
Patient will DC to: [facility] Anticipated DC date: 08/08/2018 Family notified: [facility] Transport by: Corey Harold  Per MD patient ready for DC to [facility] . RN, patient, patient's family, and facility notified of DC. Discharge Summary sent to facility. RN given number for report [phone]. DC packet on chart. Ambulance transport requested for patient.  CSW signing off.  [place], [place]

## 2018-08-08 NOTE — Discharge Summary (Addendum)
Physician Discharge Summary  [name] ERD:[id number] DOB: [birth year] DOA: 08/01/2018  PCP: Penny Squibb, MD  Admit date: 08/01/2018 Discharge date: 08/08/2018  Recommendations for Outpatient Follow-up:   Left intertrochanteric hip fracture.  Status post surgery 11/27. --Doing well.  Continue physical therapy, weight-bear as tolerated.  DVT prophylaxis with Lovenox for a total of 30 days postoperatively.  Essential hypertension  Moderate malnutrition Follow-up Information    Penny Squibb, MD. Schedule an appointment as soon as possible for a visit in 2 week(s).   Specialty:  Internal Medicine Contact information: [place] [address] [phone]            Discharge Diagnoses:  1. Left intertrochanteric hip fracture (Principal) 2. Acute blood loss anemia secondary to fracture, surgery  3. COPD, chronic hypoxic respiratory failure on home oxygen 4. Chronic diastolic CHF 5. Hypothyroidism 6. Essential hypertension 7. Moderate malnutrition  Discharge Condition: improved Disposition: SNF  Diet recommendation: heart healthyu  Filed Weights   08/01/18 1728 08/03/18 0722  Weight: 45.4 kg 45.4 kg    History of present illness:  77 year old woman PMH diastolic CHF, COPD, hypothyroidism presented after mechanical fall at home resulting in left hip pain.  Imaging confirmed comminuted, displaced left intertrochanteric fracture.  Hospital Course:  Patient underwent operative fixation complicated only by postoperative anemia and thrombocytopenia.  Did well with physical therapy.  Other issues included dementia with sundowning behavior.  Discussed with son at bedside 12/2, patient under hospice care previously and took both morphine and a benzodiazepine for anxiety or shortness of breath.  I have reviewed the narcotic database and she indeed has been prescribed morphine as well as Xanax and Ativan.  I attempted to contact her PCP but was put on hold  multiple times without answer.  Also contacted hospice but patient has discharged them. Discussed with son, will continue Rx as this seems to be most effective for anxiety and will  Rx few days of oxycodone.  Physical therapy recommended skilled nursing facility, hospitalization was prolonged by the holiday weekend and need for PASSAR number.   Left intertrochanteric hip fracture.  Status post surgery 11/27. --Doing well.  Continue physical therapy, weight-bear as tolerated.  DVT prophylaxis with Lovenox for a total of 30 days postoperatively.  Acute blood loss anemia secondary to fracture, surgery. Hemoglobin improved appropriately status post transfusion 2 units PRBC.    No further evaluation suggested.  COPD, chronic hypoxic respiratory failure on home oxygen --Remained stable, continue supplemental oxygen and bronchodilators.  Chronic diastolic CHF --Remains compensated, continue diltiazem and torsemide.  Hypothyroidism --Continue levothyroxine  Essential hypertension --Stable.    Continue diltiazem and torsemide.  Moderate malnutrition --Continue Ensure, Magic cup, multivitamin per dietitian  Anxiety, continue morphine and BZD as at hospice.  Dementia with sundowning behavior --Appears stable at this point.  Pharmacotherapy could can be considered in the future.  Consultants:  Orthopedics   Procedures:  INTRAMEDULLARY (IM) NAIL INTERTROCHANTRIC (Left) with Biomet Affixus Nail,10mm x365mm  Transfusion 2 units PRBC 11/28.  Today's assessment: S: Feels well, breathing at baseline, eating well, no pain. O: Vitals:  Vitals:   08/08/18 0700 08/08/18 1407  BP: 130/61 120/62  Pulse: 87 85  Resp:  18  Temp: 98.3 F (36.8 C) 97.6 F (36.4 C)  SpO2: 98% 97%    Constitutional:  . Appears calm and comfortable Respiratory:  . CTA bilaterally, no w/r/r.  . Respiratory effort normal.  Cardiovascular:  . RRR, no m/r/g . No  LE extremity edema     Musculoskeletal:  . RLE, LLE   . Moves both extremities to command Psychiatric:  . Mental status o Mood, affect appropriate  No new labs  Discharge Instructions   Allergies as of 08/08/2018      Reactions   Bee Venom Swelling   SWELLING REACTION UNSPECIFIED       Medication List    TAKE these medications   acetaminophen 325 MG tablet Commonly known as:  TYLENOL Take 2 tablets (650 mg total) by mouth every 6 (six) hours as needed for mild pain (or Fever >/= 101).   albuterol (2.5 MG/3ML) 0.083% nebulizer solution Commonly known as:  PROVENTIL Take 3 mLs (2.5 mg total) by nebulization 3 (three) times daily. What changed:    when to take this  reasons to take this   albuterol 108 (90 Base) MCG/ACT inhaler Commonly known as:  PROVENTIL HFA;VENTOLIN HFA Inhale 2 puffs into the lungs every 6 (six) hours as needed for wheezing or shortness of breath. Shortness of breath What changed:  Another medication with the same name was changed. Make sure you understand how and when to take each.   ALPRAZolam 0.5 MG tablet Commonly known as:  XANAX Take 1 tablet (0.5 mg total) by mouth 3 (three) times daily as needed for anxiety.   buPROPion 150 MG 24 hr tablet Commonly known as:  WELLBUTRIN XL Take 150 mg by mouth daily.   diltiazem 120 MG 24 hr capsule Commonly known as:  CARDIZEM CD Take 1 capsule (120 mg total) by mouth daily.   enoxaparin 30 MG/0.3ML injection Commonly known as:  LOVENOX Inject 0.3 mLs (30 mg total) into the skin at bedtime for 24 days. Last dose 09/01/2018.   feeding supplement (ENSURE ENLIVE) Liqd Take 237 mLs by mouth 2 (two) times daily between meals.   levothyroxine 50 MCG tablet Commonly known as:  SYNTHROID, LEVOTHROID Take 50 mcg by mouth daily before breakfast.   multivitamin capsule Take 1 capsule by mouth every morning.   oxyCODONE 5 MG immediate release tablet Commonly known as:  Oxy IR/ROXICODONE Take 1 tablet (5 mg total) by mouth  every 6 (six) hours as needed for moderate pain.   pantoprazole 40 MG tablet Commonly known as:  PROTONIX Take 1 tablet (40 mg total) by mouth daily before breakfast.   polyethylene glycol packet Commonly known as:  MIRALAX / GLYCOLAX Take 17 g by mouth 2 (two) times daily. What changed:    when to take this  reasons to take this   pravastatin 40 MG tablet Commonly known as:  PRAVACHOL Take 1 tablet (40 mg total) by mouth at bedtime. What changed:  when to take this   tiotropium 18 MCG inhalation capsule Commonly known as:  SPIRIVA Place 1 capsule (18 mcg total) into inhaler and inhale daily. What changed:    when to take this  reasons to take this   torsemide 20 MG tablet Commonly known as:  DEMADEX Take 1 tablet (20 mg total) by mouth once. What changed:  when to take this   VITAMIN C PO Take 1 tablet by mouth every morning.      Allergies  Allergen Reactions  . Bee Venom Swelling    SWELLING REACTION UNSPECIFIED     The results of significant diagnostics from this hospitalization (including imaging, microbiology, ancillary and laboratory) are listed below for reference.    Significant Diagnostic Studies: Dg Chest 1 View  Result Date: 08/01/2018 CLINICAL DATA:  Fall EXAM: CHEST  1 VIEW COMPARISON:  12/27/2015, CT 07/19/2017 FINDINGS: Hyperinflation with emphysematous disease. No acute opacity or pleural effusion. Normal heart size with aortic atherosclerosis. No pneumothorax. IMPRESSION: No active disease.  Hyperinflation with emphysematous disease Electronically Signed   By: Donavan Foil M.D.   On: 08/01/2018 18:29   Ct Head Wo Contrast  Result Date: 08/01/2018 CLINICAL DATA:  Status post fall. EXAM: CT HEAD WITHOUT CONTRAST TECHNIQUE: Contiguous axial images were obtained from the base of the skull through the vertex without intravenous contrast. COMPARISON:  02/26/2018 FINDINGS: Brain: No evidence of acute infarction, hemorrhage, hydrocephalus,  extra-axial collection or mass lesion/mass effect. Moderate brain parenchymal volume loss and moderate deep white matter microangiopathy. Chronic enlarged sulcus along the left posterior vertex. Vascular: Calcific atherosclerotic disease of the intracavernous carotid arteries. Skull: Normal. Negative for fracture or focal lesion. Sinuses/Orbits: No acute finding. Other: None. IMPRESSION: No acute intracranial abnormality. Moderate brain parenchymal atrophy and chronic microvascular disease. Electronically Signed   By: Fidela Salisbury M.D.   On: 08/01/2018 20:15   Dg Knee Left Port  Result Date: 08/02/2018 CLINICAL DATA:  Recent fall with left knee pain, initial encounter EXAM: PORTABLE LEFT KNEE - 2 VIEW COMPARISON:  None. FINDINGS: No acute fracture or dislocation is noted. No joint effusion is seen. No gross soft tissue abnormality is noted. Mild vascular calcifications are seen. IMPRESSION: No acute abnormality noted. Electronically Signed   By: Inez Catalina M.D.   On: 08/02/2018 14:09   Dg C-arm 1-60 Min  Result Date: 08/03/2018 CLINICAL DATA:  IM nail EXAM: DG C-ARM 61-120 MIN; LEFT FEMUR 2 VIEWS COMPARISON:  08/01/2018 FINDINGS: Internal fixation across the left femoral intertrochanteric fracture. No hardware complicating feature. Normal alignment. IMPRESSION: Internal fixation across the left intertrochanteric fracture with anatomic alignment. Electronically Signed   By: Rolm Baptise M.D.   On: 08/03/2018 10:19   Dg Hip Unilat With Pelvis 2-3 Views Left  Result Date: 08/01/2018 CLINICAL DATA:  Fall with hip pain EXAM: DG HIP (WITH OR WITHOUT PELVIS) 2-3V LEFT COMPARISON:  02/26/2018 FINDINGS: Bones appear diffusely demineralized. SI joints are non widened. Pubic symphysis and rami appear intact. Normal alignment of right hip. Acute mildly comminuted and displaced left intertrochanteric fracture. Left femoral head demonstrates normal positioning. IMPRESSION: Acute mildly comminuted and  displaced left intertrochanteric fracture. Electronically Signed   By: Donavan Foil M.D.   On: 08/01/2018 18:28   Dg Femur Min 2 Views Left  Result Date: 08/03/2018 CLINICAL DATA:  IM nail EXAM: DG C-ARM 61-120 MIN; LEFT FEMUR 2 VIEWS COMPARISON:  08/01/2018 FINDINGS: Internal fixation across the left femoral intertrochanteric fracture. No hardware complicating feature. Normal alignment. IMPRESSION: Internal fixation across the left intertrochanteric fracture with anatomic alignment. Electronically Signed   By: Rolm Baptise M.D.   On: 08/03/2018 10:19   Dg Femur Port Min 2 Views Left  Result Date: 08/03/2018 CLINICAL DATA:  Status post ORIF of left femoral fracture EXAM: LEFT FEMUR PORTABLE 2 VIEWS COMPARISON:  08/01/2018, 08/03/2018 FINDINGS: Medullary rod is noted within the proximal femur. Fixation screws noted traversing the femoral neck. Distal fixation screw in the femur is noted as well. The fracture fragments are in near anatomic alignment. IMPRESSION: Status post ORIF of proximal left femoral fracture. Electronically Signed   By: Inez Catalina M.D.   On: 08/03/2018 12:08    Microbiology: Recent Results (from the past 240 hour(s))  MRSA PCR Screening     Status: None   Collection Time: 08/02/18  1:56 AM  Result Value Ref Range Status   MRSA by PCR NEGATIVE NEGATIVE Final    Comment:        The GeneXpert MRSA Assay (FDA approved for NASAL specimens only), is one component of a comprehensive MRSA colonization surveillance program. It is not intended to diagnose MRSA infection nor to guide or monitor treatment for MRSA infections. Performed at [facility], [place] [address], [place], [place] [address]      Labs: Basic Metabolic Panel: Recent Labs  Lab 08/01/18 1949 08/02/18 0842 08/03/18 0336 08/04/18 0237  NA 138 142 143 141  K 2.6* 3.7 4.8 4.8  CL 91* 99 110 105  CO2 35* 36* 30 29  GLUCOSE 158* 113* 100* 96  BUN 16 14 13 9   CREATININE 1.15* 1.23* 0.93 0.87    CALCIUM 8.7* 8.8* 8.6*  8.6* 8.7*  MG 1.9  --   --   --   PHOS 3.6  --   --   --    Liver Function Tests: Recent Labs  Lab 08/01/18 1949 08/03/18 0336  AST 16 13*  ALT 13 12  ALKPHOS 77 60  BILITOT 0.8 0.5  PROT 6.8 5.1*  ALBUMIN 3.9 2.8*   CBC: Recent Labs  Lab 08/01/18 1949 08/03/18 0336 08/03/18 1354 08/04/18 0237 08/05/18 0123  WBC 14.3* 7.4 11.0* 8.9 10.6*  NEUTROABS 12.0*  --   --   --   --   HGB 11.0* 8.8* 8.9* 7.4* 12.4  HCT 34.9* 29.7* 29.4* 24.9* 37.0  MCV 89.0 92.5 92.5 91.5 85.8  PLT 298 239 236 226 194   Cardiac Enzymes: Recent Labs  Lab 08/01/18 1949  TROPONINI <0.03    Principal Problem:   Closed left hip fracture, initial encounter ([place]) Active Problems:   COPD Golds D, frequent exacerbations   Hypertension   Hyperlipidemia   Hypothyroidism   Chronic respiratory failure with hypoxia (HCC)   Diastolic dysfunction   Malnutrition of moderate degree   Acute blood loss anemia   Time coordinating discharge: 35 minutes  Signed:  Murray Hodgkins, MD [facility] 08/08/2018, 3:01 PM

## 2018-08-15 DIAGNOSIS — E039 Hypothyroidism, unspecified: Secondary | ICD-10-CM | POA: Diagnosis not present

## 2018-08-15 DIAGNOSIS — S72122A Displaced fracture of lesser trochanter of left femur, initial encounter for closed fracture: Secondary | ICD-10-CM | POA: Diagnosis not present

## 2018-08-15 DIAGNOSIS — S72112A Displaced fracture of greater trochanter of left femur, initial encounter for closed fracture: Secondary | ICD-10-CM | POA: Diagnosis not present

## 2018-08-15 DIAGNOSIS — J449 Chronic obstructive pulmonary disease, unspecified: Secondary | ICD-10-CM | POA: Diagnosis not present

## 2018-08-15 DIAGNOSIS — E876 Hypokalemia: Secondary | ICD-10-CM | POA: Diagnosis not present

## 2018-08-15 DIAGNOSIS — I5030 Unspecified diastolic (congestive) heart failure: Secondary | ICD-10-CM | POA: Diagnosis not present

## 2018-08-15 DIAGNOSIS — I110 Hypertensive heart disease with heart failure: Secondary | ICD-10-CM | POA: Diagnosis not present

## 2018-08-15 DIAGNOSIS — S72002A Fracture of unspecified part of neck of left femur, initial encounter for closed fracture: Secondary | ICD-10-CM | POA: Diagnosis not present

## 2018-08-15 DIAGNOSIS — E440 Moderate protein-calorie malnutrition: Secondary | ICD-10-CM | POA: Diagnosis not present

## 2018-08-15 DIAGNOSIS — W06XXXA Fall from bed, initial encounter: Secondary | ICD-10-CM | POA: Diagnosis not present

## 2018-08-15 DIAGNOSIS — S299XXA Unspecified injury of thorax, initial encounter: Secondary | ICD-10-CM | POA: Diagnosis not present

## 2018-08-16 DIAGNOSIS — I5030 Unspecified diastolic (congestive) heart failure: Secondary | ICD-10-CM | POA: Diagnosis not present

## 2018-08-16 DIAGNOSIS — W06XXXA Fall from bed, initial encounter: Secondary | ICD-10-CM | POA: Diagnosis not present

## 2018-08-16 DIAGNOSIS — S72112A Displaced fracture of greater trochanter of left femur, initial encounter for closed fracture: Secondary | ICD-10-CM | POA: Diagnosis not present

## 2018-08-16 DIAGNOSIS — I110 Hypertensive heart disease with heart failure: Secondary | ICD-10-CM | POA: Diagnosis not present

## 2018-08-16 DIAGNOSIS — J449 Chronic obstructive pulmonary disease, unspecified: Secondary | ICD-10-CM | POA: Diagnosis not present

## 2018-08-17 DIAGNOSIS — S72002A Fracture of unspecified part of neck of left femur, initial encounter for closed fracture: Secondary | ICD-10-CM | POA: Diagnosis not present

## 2018-08-17 DIAGNOSIS — I5022 Chronic systolic (congestive) heart failure: Secondary | ICD-10-CM | POA: Diagnosis not present

## 2018-08-17 DIAGNOSIS — Z96642 Presence of left artificial hip joint: Secondary | ICD-10-CM | POA: Diagnosis not present

## 2018-08-17 DIAGNOSIS — J449 Chronic obstructive pulmonary disease, unspecified: Secondary | ICD-10-CM | POA: Diagnosis not present

## 2018-08-17 DIAGNOSIS — S7290XA Unspecified fracture of unspecified femur, initial encounter for closed fracture: Secondary | ICD-10-CM | POA: Diagnosis not present

## 2018-08-20 DIAGNOSIS — Z87891 Personal history of nicotine dependence: Secondary | ICD-10-CM | POA: Diagnosis not present

## 2018-08-20 DIAGNOSIS — N390 Urinary tract infection, site not specified: Secondary | ICD-10-CM | POA: Diagnosis not present

## 2018-08-20 DIAGNOSIS — J15212 Pneumonia due to Methicillin resistant Staphylococcus aureus: Secondary | ICD-10-CM | POA: Diagnosis not present

## 2018-08-20 DIAGNOSIS — R7989 Other specified abnormal findings of blood chemistry: Secondary | ICD-10-CM | POA: Diagnosis not present

## 2018-08-20 DIAGNOSIS — J440 Chronic obstructive pulmonary disease with acute lower respiratory infection: Secondary | ICD-10-CM | POA: Diagnosis not present

## 2018-08-20 DIAGNOSIS — J189 Pneumonia, unspecified organism: Secondary | ICD-10-CM | POA: Diagnosis not present

## 2018-08-20 DIAGNOSIS — E440 Moderate protein-calorie malnutrition: Secondary | ICD-10-CM | POA: Diagnosis not present

## 2018-08-20 DIAGNOSIS — R042 Hemoptysis: Secondary | ICD-10-CM | POA: Diagnosis not present

## 2018-08-20 DIAGNOSIS — I5032 Chronic diastolic (congestive) heart failure: Secondary | ICD-10-CM | POA: Diagnosis not present

## 2018-08-20 DIAGNOSIS — J209 Acute bronchitis, unspecified: Secondary | ICD-10-CM | POA: Diagnosis not present

## 2018-08-21 DIAGNOSIS — Z90710 Acquired absence of both cervix and uterus: Secondary | ICD-10-CM | POA: Diagnosis not present

## 2018-08-21 DIAGNOSIS — I10 Essential (primary) hypertension: Secondary | ICD-10-CM | POA: Diagnosis not present

## 2018-08-21 DIAGNOSIS — N959 Unspecified menopausal and perimenopausal disorder: Secondary | ICD-10-CM | POA: Diagnosis present

## 2018-08-21 DIAGNOSIS — E440 Moderate protein-calorie malnutrition: Secondary | ICD-10-CM | POA: Diagnosis present

## 2018-08-21 DIAGNOSIS — F411 Generalized anxiety disorder: Secondary | ICD-10-CM | POA: Diagnosis present

## 2018-08-21 DIAGNOSIS — E039 Hypothyroidism, unspecified: Secondary | ICD-10-CM | POA: Diagnosis present

## 2018-08-21 DIAGNOSIS — I110 Hypertensive heart disease with heart failure: Secondary | ICD-10-CM | POA: Diagnosis present

## 2018-08-21 DIAGNOSIS — E785 Hyperlipidemia, unspecified: Secondary | ICD-10-CM | POA: Diagnosis present

## 2018-08-21 DIAGNOSIS — I5032 Chronic diastolic (congestive) heart failure: Secondary | ICD-10-CM | POA: Diagnosis present

## 2018-08-21 DIAGNOSIS — Z96642 Presence of left artificial hip joint: Secondary | ICD-10-CM | POA: Diagnosis present

## 2018-08-21 DIAGNOSIS — J189 Pneumonia, unspecified organism: Secondary | ICD-10-CM | POA: Diagnosis not present

## 2018-08-21 DIAGNOSIS — Z9981 Dependence on supplemental oxygen: Secondary | ICD-10-CM | POA: Diagnosis not present

## 2018-08-21 DIAGNOSIS — J440 Chronic obstructive pulmonary disease with acute lower respiratory infection: Secondary | ICD-10-CM | POA: Diagnosis present

## 2018-08-21 DIAGNOSIS — F0390 Unspecified dementia without behavioral disturbance: Secondary | ICD-10-CM | POA: Diagnosis present

## 2018-08-21 DIAGNOSIS — J9691 Respiratory failure, unspecified with hypoxia: Secondary | ICD-10-CM | POA: Diagnosis not present

## 2018-08-21 DIAGNOSIS — N390 Urinary tract infection, site not specified: Secondary | ICD-10-CM | POA: Diagnosis present

## 2018-08-21 DIAGNOSIS — F339 Major depressive disorder, recurrent, unspecified: Secondary | ICD-10-CM | POA: Diagnosis not present

## 2018-08-21 DIAGNOSIS — J181 Lobar pneumonia, unspecified organism: Secondary | ICD-10-CM | POA: Diagnosis not present

## 2018-08-21 DIAGNOSIS — N63 Unspecified lump in unspecified breast: Secondary | ICD-10-CM | POA: Diagnosis present

## 2018-08-21 DIAGNOSIS — R7989 Other specified abnormal findings of blood chemistry: Secondary | ICD-10-CM | POA: Diagnosis not present

## 2018-08-21 DIAGNOSIS — F17210 Nicotine dependence, cigarettes, uncomplicated: Secondary | ICD-10-CM | POA: Diagnosis present

## 2018-08-21 DIAGNOSIS — B9620 Unspecified Escherichia coli [E. coli] as the cause of diseases classified elsewhere: Secondary | ICD-10-CM | POA: Diagnosis not present

## 2018-08-21 DIAGNOSIS — J15212 Pneumonia due to Methicillin resistant Staphylococcus aureus: Secondary | ICD-10-CM | POA: Diagnosis present

## 2018-08-21 DIAGNOSIS — E876 Hypokalemia: Secondary | ICD-10-CM | POA: Diagnosis present

## 2018-08-21 DIAGNOSIS — M6281 Muscle weakness (generalized): Secondary | ICD-10-CM | POA: Diagnosis not present

## 2018-08-21 DIAGNOSIS — F0391 Unspecified dementia with behavioral disturbance: Secondary | ICD-10-CM | POA: Diagnosis not present

## 2018-08-21 DIAGNOSIS — I700 Atherosclerosis of aorta: Secondary | ICD-10-CM | POA: Diagnosis present

## 2018-08-21 DIAGNOSIS — R296 Repeated falls: Secondary | ICD-10-CM | POA: Diagnosis not present

## 2018-08-21 DIAGNOSIS — R042 Hemoptysis: Secondary | ICD-10-CM | POA: Diagnosis present

## 2018-08-21 DIAGNOSIS — F419 Anxiety disorder, unspecified: Secondary | ICD-10-CM | POA: Diagnosis not present

## 2018-08-21 DIAGNOSIS — Z681 Body mass index (BMI) 19 or less, adult: Secondary | ICD-10-CM | POA: Diagnosis not present

## 2018-08-21 DIAGNOSIS — D62 Acute posthemorrhagic anemia: Secondary | ICD-10-CM | POA: Diagnosis not present

## 2018-08-21 DIAGNOSIS — J449 Chronic obstructive pulmonary disease, unspecified: Secondary | ICD-10-CM | POA: Diagnosis not present

## 2018-08-25 DIAGNOSIS — E785 Hyperlipidemia, unspecified: Secondary | ICD-10-CM | POA: Diagnosis not present

## 2018-08-25 DIAGNOSIS — J440 Chronic obstructive pulmonary disease with acute lower respiratory infection: Secondary | ICD-10-CM | POA: Diagnosis not present

## 2018-08-25 DIAGNOSIS — S72142D Displaced intertrochanteric fracture of left femur, subsequent encounter for closed fracture with routine healing: Secondary | ICD-10-CM | POA: Diagnosis not present

## 2018-08-25 DIAGNOSIS — Z87891 Personal history of nicotine dependence: Secondary | ICD-10-CM | POA: Diagnosis not present

## 2018-08-25 DIAGNOSIS — F419 Anxiety disorder, unspecified: Secondary | ICD-10-CM | POA: Diagnosis not present

## 2018-08-25 DIAGNOSIS — R5381 Other malaise: Secondary | ICD-10-CM | POA: Diagnosis not present

## 2018-08-25 DIAGNOSIS — Z79890 Hormone replacement therapy (postmenopausal): Secondary | ICD-10-CM | POA: Diagnosis not present

## 2018-08-25 DIAGNOSIS — D62 Acute posthemorrhagic anemia: Secondary | ICD-10-CM | POA: Diagnosis not present

## 2018-08-25 DIAGNOSIS — Z79899 Other long term (current) drug therapy: Secondary | ICD-10-CM | POA: Diagnosis not present

## 2018-08-25 DIAGNOSIS — I10 Essential (primary) hypertension: Secondary | ICD-10-CM | POA: Diagnosis not present

## 2018-08-25 DIAGNOSIS — J181 Lobar pneumonia, unspecified organism: Secondary | ICD-10-CM | POA: Diagnosis not present

## 2018-08-25 DIAGNOSIS — R58 Hemorrhage, not elsewhere classified: Secondary | ICD-10-CM | POA: Diagnosis not present

## 2018-08-25 DIAGNOSIS — E039 Hypothyroidism, unspecified: Secondary | ICD-10-CM | POA: Diagnosis not present

## 2018-08-25 DIAGNOSIS — J449 Chronic obstructive pulmonary disease, unspecified: Secondary | ICD-10-CM | POA: Diagnosis not present

## 2018-08-25 DIAGNOSIS — Z9981 Dependence on supplemental oxygen: Secondary | ICD-10-CM | POA: Diagnosis not present

## 2018-08-25 DIAGNOSIS — Z743 Need for continuous supervision: Secondary | ICD-10-CM | POA: Diagnosis not present

## 2018-08-25 DIAGNOSIS — E440 Moderate protein-calorie malnutrition: Secondary | ICD-10-CM | POA: Diagnosis not present

## 2018-08-25 DIAGNOSIS — E876 Hypokalemia: Secondary | ICD-10-CM | POA: Diagnosis not present

## 2018-08-25 DIAGNOSIS — N390 Urinary tract infection, site not specified: Secondary | ICD-10-CM | POA: Diagnosis not present

## 2018-08-25 DIAGNOSIS — F339 Major depressive disorder, recurrent, unspecified: Secondary | ICD-10-CM | POA: Diagnosis not present

## 2018-08-25 DIAGNOSIS — J15212 Pneumonia due to Methicillin resistant Staphylococcus aureus: Secondary | ICD-10-CM | POA: Diagnosis not present

## 2018-08-25 DIAGNOSIS — R0602 Shortness of breath: Secondary | ICD-10-CM | POA: Diagnosis not present

## 2018-08-25 DIAGNOSIS — R040 Epistaxis: Secondary | ICD-10-CM | POA: Diagnosis not present

## 2018-08-25 DIAGNOSIS — I5032 Chronic diastolic (congestive) heart failure: Secondary | ICD-10-CM | POA: Diagnosis not present

## 2018-08-25 DIAGNOSIS — R296 Repeated falls: Secondary | ICD-10-CM | POA: Diagnosis not present

## 2018-08-25 DIAGNOSIS — B9620 Unspecified Escherichia coli [E. coli] as the cause of diseases classified elsewhere: Secondary | ICD-10-CM | POA: Diagnosis not present

## 2018-08-25 DIAGNOSIS — M6281 Muscle weakness (generalized): Secondary | ICD-10-CM | POA: Diagnosis not present

## 2018-08-25 DIAGNOSIS — F0391 Unspecified dementia with behavioral disturbance: Secondary | ICD-10-CM | POA: Diagnosis not present

## 2018-08-25 DIAGNOSIS — J9691 Respiratory failure, unspecified with hypoxia: Secondary | ICD-10-CM | POA: Diagnosis not present

## 2018-08-28 DIAGNOSIS — J449 Chronic obstructive pulmonary disease, unspecified: Secondary | ICD-10-CM | POA: Diagnosis not present

## 2018-08-28 DIAGNOSIS — Z79899 Other long term (current) drug therapy: Secondary | ICD-10-CM | POA: Diagnosis not present

## 2018-08-28 DIAGNOSIS — Z79890 Hormone replacement therapy (postmenopausal): Secondary | ICD-10-CM | POA: Diagnosis not present

## 2018-08-28 DIAGNOSIS — R040 Epistaxis: Secondary | ICD-10-CM | POA: Diagnosis not present

## 2018-08-28 DIAGNOSIS — Z87891 Personal history of nicotine dependence: Secondary | ICD-10-CM | POA: Diagnosis not present

## 2018-08-28 DIAGNOSIS — I10 Essential (primary) hypertension: Secondary | ICD-10-CM | POA: Diagnosis not present

## 2018-08-29 DIAGNOSIS — S72142D Displaced intertrochanteric fracture of left femur, subsequent encounter for closed fracture with routine healing: Secondary | ICD-10-CM | POA: Diagnosis not present

## 2018-09-20 DIAGNOSIS — G479 Sleep disorder, unspecified: Secondary | ICD-10-CM | POA: Diagnosis not present

## 2018-09-20 DIAGNOSIS — F0280 Dementia in other diseases classified elsewhere without behavioral disturbance: Secondary | ICD-10-CM | POA: Diagnosis not present

## 2018-09-20 DIAGNOSIS — R531 Weakness: Secondary | ICD-10-CM | POA: Diagnosis not present

## 2018-09-20 DIAGNOSIS — Z96642 Presence of left artificial hip joint: Secondary | ICD-10-CM | POA: Diagnosis not present

## 2018-09-20 DIAGNOSIS — Z9981 Dependence on supplemental oxygen: Secondary | ICD-10-CM | POA: Diagnosis not present

## 2018-09-20 DIAGNOSIS — J449 Chronic obstructive pulmonary disease, unspecified: Secondary | ICD-10-CM | POA: Diagnosis not present

## 2018-09-22 DIAGNOSIS — R262 Difficulty in walking, not elsewhere classified: Secondary | ICD-10-CM | POA: Diagnosis not present

## 2018-09-22 DIAGNOSIS — F419 Anxiety disorder, unspecified: Secondary | ICD-10-CM | POA: Diagnosis not present

## 2018-09-22 DIAGNOSIS — F0390 Unspecified dementia without behavioral disturbance: Secondary | ICD-10-CM | POA: Diagnosis not present

## 2018-09-22 DIAGNOSIS — S3219XD Other fracture of sacrum, subsequent encounter for fracture with routine healing: Secondary | ICD-10-CM | POA: Diagnosis not present

## 2018-09-22 DIAGNOSIS — S72302D Unspecified fracture of shaft of left femur, subsequent encounter for closed fracture with routine healing: Secondary | ICD-10-CM | POA: Diagnosis not present

## 2018-09-22 DIAGNOSIS — D649 Anemia, unspecified: Secondary | ICD-10-CM | POA: Diagnosis not present

## 2018-09-22 DIAGNOSIS — I4891 Unspecified atrial fibrillation: Secondary | ICD-10-CM | POA: Diagnosis not present

## 2018-09-22 DIAGNOSIS — M545 Low back pain: Secondary | ICD-10-CM | POA: Diagnosis not present

## 2018-09-22 DIAGNOSIS — Z87891 Personal history of nicotine dependence: Secondary | ICD-10-CM | POA: Diagnosis not present

## 2018-09-22 DIAGNOSIS — N183 Chronic kidney disease, stage 3 (moderate): Secondary | ICD-10-CM | POA: Diagnosis not present

## 2018-09-22 DIAGNOSIS — E039 Hypothyroidism, unspecified: Secondary | ICD-10-CM | POA: Diagnosis not present

## 2018-09-22 DIAGNOSIS — Z96642 Presence of left artificial hip joint: Secondary | ICD-10-CM | POA: Diagnosis not present

## 2018-09-22 DIAGNOSIS — G4700 Insomnia, unspecified: Secondary | ICD-10-CM | POA: Diagnosis not present

## 2018-09-22 DIAGNOSIS — J449 Chronic obstructive pulmonary disease, unspecified: Secondary | ICD-10-CM | POA: Diagnosis not present

## 2018-09-22 DIAGNOSIS — Z9981 Dependence on supplemental oxygen: Secondary | ICD-10-CM | POA: Diagnosis not present

## 2018-09-22 DIAGNOSIS — E782 Mixed hyperlipidemia: Secondary | ICD-10-CM | POA: Diagnosis not present

## 2018-09-22 DIAGNOSIS — Z9181 History of falling: Secondary | ICD-10-CM | POA: Diagnosis not present

## 2018-09-22 DIAGNOSIS — Z6821 Body mass index (BMI) 21.0-21.9, adult: Secondary | ICD-10-CM | POA: Diagnosis not present

## 2018-09-22 DIAGNOSIS — F331 Major depressive disorder, recurrent, moderate: Secondary | ICD-10-CM | POA: Diagnosis not present

## 2018-09-22 DIAGNOSIS — M533 Sacrococcygeal disorders, not elsewhere classified: Secondary | ICD-10-CM | POA: Diagnosis not present

## 2018-09-22 DIAGNOSIS — M6281 Muscle weakness (generalized): Secondary | ICD-10-CM | POA: Diagnosis not present

## 2018-09-23 DIAGNOSIS — J449 Chronic obstructive pulmonary disease, unspecified: Secondary | ICD-10-CM | POA: Diagnosis not present

## 2018-09-23 DIAGNOSIS — F0390 Unspecified dementia without behavioral disturbance: Secondary | ICD-10-CM | POA: Diagnosis not present

## 2018-09-23 DIAGNOSIS — F419 Anxiety disorder, unspecified: Secondary | ICD-10-CM | POA: Diagnosis not present

## 2018-09-23 DIAGNOSIS — I4891 Unspecified atrial fibrillation: Secondary | ICD-10-CM | POA: Diagnosis not present

## 2018-09-23 DIAGNOSIS — S3219XD Other fracture of sacrum, subsequent encounter for fracture with routine healing: Secondary | ICD-10-CM | POA: Diagnosis not present

## 2018-09-23 DIAGNOSIS — D649 Anemia, unspecified: Secondary | ICD-10-CM | POA: Diagnosis not present

## 2018-09-26 DIAGNOSIS — F0390 Unspecified dementia without behavioral disturbance: Secondary | ICD-10-CM | POA: Diagnosis not present

## 2018-09-26 DIAGNOSIS — J449 Chronic obstructive pulmonary disease, unspecified: Secondary | ICD-10-CM | POA: Diagnosis not present

## 2018-09-26 DIAGNOSIS — I4891 Unspecified atrial fibrillation: Secondary | ICD-10-CM | POA: Diagnosis not present

## 2018-09-26 DIAGNOSIS — F419 Anxiety disorder, unspecified: Secondary | ICD-10-CM | POA: Diagnosis not present

## 2018-09-26 DIAGNOSIS — D649 Anemia, unspecified: Secondary | ICD-10-CM | POA: Diagnosis not present

## 2018-09-26 DIAGNOSIS — S3219XD Other fracture of sacrum, subsequent encounter for fracture with routine healing: Secondary | ICD-10-CM | POA: Diagnosis not present

## 2018-09-27 DIAGNOSIS — D649 Anemia, unspecified: Secondary | ICD-10-CM | POA: Diagnosis not present

## 2018-09-27 DIAGNOSIS — F419 Anxiety disorder, unspecified: Secondary | ICD-10-CM | POA: Diagnosis not present

## 2018-09-27 DIAGNOSIS — F0390 Unspecified dementia without behavioral disturbance: Secondary | ICD-10-CM | POA: Diagnosis not present

## 2018-09-27 DIAGNOSIS — S3219XD Other fracture of sacrum, subsequent encounter for fracture with routine healing: Secondary | ICD-10-CM | POA: Diagnosis not present

## 2018-09-27 DIAGNOSIS — I4891 Unspecified atrial fibrillation: Secondary | ICD-10-CM | POA: Diagnosis not present

## 2018-09-27 DIAGNOSIS — J449 Chronic obstructive pulmonary disease, unspecified: Secondary | ICD-10-CM | POA: Diagnosis not present

## 2018-09-28 DIAGNOSIS — F419 Anxiety disorder, unspecified: Secondary | ICD-10-CM | POA: Diagnosis not present

## 2018-09-28 DIAGNOSIS — D649 Anemia, unspecified: Secondary | ICD-10-CM | POA: Diagnosis not present

## 2018-09-28 DIAGNOSIS — F0390 Unspecified dementia without behavioral disturbance: Secondary | ICD-10-CM | POA: Diagnosis not present

## 2018-09-28 DIAGNOSIS — S3219XD Other fracture of sacrum, subsequent encounter for fracture with routine healing: Secondary | ICD-10-CM | POA: Diagnosis not present

## 2018-09-28 DIAGNOSIS — I4891 Unspecified atrial fibrillation: Secondary | ICD-10-CM | POA: Diagnosis not present

## 2018-09-28 DIAGNOSIS — J449 Chronic obstructive pulmonary disease, unspecified: Secondary | ICD-10-CM | POA: Diagnosis not present

## 2018-09-29 DIAGNOSIS — I4891 Unspecified atrial fibrillation: Secondary | ICD-10-CM | POA: Diagnosis not present

## 2018-09-29 DIAGNOSIS — F0390 Unspecified dementia without behavioral disturbance: Secondary | ICD-10-CM | POA: Diagnosis not present

## 2018-09-29 DIAGNOSIS — S3219XD Other fracture of sacrum, subsequent encounter for fracture with routine healing: Secondary | ICD-10-CM | POA: Diagnosis not present

## 2018-09-29 DIAGNOSIS — F419 Anxiety disorder, unspecified: Secondary | ICD-10-CM | POA: Diagnosis not present

## 2018-09-29 DIAGNOSIS — J449 Chronic obstructive pulmonary disease, unspecified: Secondary | ICD-10-CM | POA: Diagnosis not present

## 2018-09-29 DIAGNOSIS — D649 Anemia, unspecified: Secondary | ICD-10-CM | POA: Diagnosis not present

## 2018-09-30 DIAGNOSIS — J449 Chronic obstructive pulmonary disease, unspecified: Secondary | ICD-10-CM | POA: Diagnosis not present

## 2018-09-30 DIAGNOSIS — F0390 Unspecified dementia without behavioral disturbance: Secondary | ICD-10-CM | POA: Diagnosis not present

## 2018-09-30 DIAGNOSIS — F419 Anxiety disorder, unspecified: Secondary | ICD-10-CM | POA: Diagnosis not present

## 2018-09-30 DIAGNOSIS — I4891 Unspecified atrial fibrillation: Secondary | ICD-10-CM | POA: Diagnosis not present

## 2018-09-30 DIAGNOSIS — D649 Anemia, unspecified: Secondary | ICD-10-CM | POA: Diagnosis not present

## 2018-09-30 DIAGNOSIS — S3219XD Other fracture of sacrum, subsequent encounter for fracture with routine healing: Secondary | ICD-10-CM | POA: Diagnosis not present

## 2018-10-01 DIAGNOSIS — F419 Anxiety disorder, unspecified: Secondary | ICD-10-CM | POA: Diagnosis not present

## 2018-10-01 DIAGNOSIS — J449 Chronic obstructive pulmonary disease, unspecified: Secondary | ICD-10-CM | POA: Diagnosis not present

## 2018-10-03 DIAGNOSIS — D649 Anemia, unspecified: Secondary | ICD-10-CM | POA: Diagnosis not present

## 2018-10-03 DIAGNOSIS — S3219XD Other fracture of sacrum, subsequent encounter for fracture with routine healing: Secondary | ICD-10-CM | POA: Diagnosis not present

## 2018-10-03 DIAGNOSIS — J449 Chronic obstructive pulmonary disease, unspecified: Secondary | ICD-10-CM | POA: Diagnosis not present

## 2018-10-03 DIAGNOSIS — F0390 Unspecified dementia without behavioral disturbance: Secondary | ICD-10-CM | POA: Diagnosis not present

## 2018-10-03 DIAGNOSIS — F419 Anxiety disorder, unspecified: Secondary | ICD-10-CM | POA: Diagnosis not present

## 2018-10-03 DIAGNOSIS — I4891 Unspecified atrial fibrillation: Secondary | ICD-10-CM | POA: Diagnosis not present

## 2018-10-04 DIAGNOSIS — J449 Chronic obstructive pulmonary disease, unspecified: Secondary | ICD-10-CM | POA: Diagnosis not present

## 2018-10-04 DIAGNOSIS — D649 Anemia, unspecified: Secondary | ICD-10-CM | POA: Diagnosis not present

## 2018-10-04 DIAGNOSIS — S3219XD Other fracture of sacrum, subsequent encounter for fracture with routine healing: Secondary | ICD-10-CM | POA: Diagnosis not present

## 2018-10-04 DIAGNOSIS — F419 Anxiety disorder, unspecified: Secondary | ICD-10-CM | POA: Diagnosis not present

## 2018-10-04 DIAGNOSIS — I4891 Unspecified atrial fibrillation: Secondary | ICD-10-CM | POA: Diagnosis not present

## 2018-10-04 DIAGNOSIS — F0390 Unspecified dementia without behavioral disturbance: Secondary | ICD-10-CM | POA: Diagnosis not present

## 2018-10-05 DIAGNOSIS — S3219XD Other fracture of sacrum, subsequent encounter for fracture with routine healing: Secondary | ICD-10-CM | POA: Diagnosis not present

## 2018-10-05 DIAGNOSIS — F419 Anxiety disorder, unspecified: Secondary | ICD-10-CM | POA: Diagnosis not present

## 2018-10-05 DIAGNOSIS — D649 Anemia, unspecified: Secondary | ICD-10-CM | POA: Diagnosis not present

## 2018-10-05 DIAGNOSIS — J449 Chronic obstructive pulmonary disease, unspecified: Secondary | ICD-10-CM | POA: Diagnosis not present

## 2018-10-05 DIAGNOSIS — I4891 Unspecified atrial fibrillation: Secondary | ICD-10-CM | POA: Diagnosis not present

## 2018-10-05 DIAGNOSIS — F0390 Unspecified dementia without behavioral disturbance: Secondary | ICD-10-CM | POA: Diagnosis not present

## 2018-10-07 DIAGNOSIS — F0390 Unspecified dementia without behavioral disturbance: Secondary | ICD-10-CM | POA: Diagnosis not present

## 2018-10-07 DIAGNOSIS — F419 Anxiety disorder, unspecified: Secondary | ICD-10-CM | POA: Diagnosis not present

## 2018-10-07 DIAGNOSIS — D649 Anemia, unspecified: Secondary | ICD-10-CM | POA: Diagnosis not present

## 2018-10-07 DIAGNOSIS — S3219XD Other fracture of sacrum, subsequent encounter for fracture with routine healing: Secondary | ICD-10-CM | POA: Diagnosis not present

## 2018-10-07 DIAGNOSIS — I4891 Unspecified atrial fibrillation: Secondary | ICD-10-CM | POA: Diagnosis not present

## 2018-10-07 DIAGNOSIS — J449 Chronic obstructive pulmonary disease, unspecified: Secondary | ICD-10-CM | POA: Diagnosis not present

## 2018-10-11 DIAGNOSIS — F419 Anxiety disorder, unspecified: Secondary | ICD-10-CM | POA: Diagnosis not present

## 2018-10-11 DIAGNOSIS — S3219XD Other fracture of sacrum, subsequent encounter for fracture with routine healing: Secondary | ICD-10-CM | POA: Diagnosis not present

## 2018-10-11 DIAGNOSIS — I4891 Unspecified atrial fibrillation: Secondary | ICD-10-CM | POA: Diagnosis not present

## 2018-10-11 DIAGNOSIS — J449 Chronic obstructive pulmonary disease, unspecified: Secondary | ICD-10-CM | POA: Diagnosis not present

## 2018-10-11 DIAGNOSIS — D649 Anemia, unspecified: Secondary | ICD-10-CM | POA: Diagnosis not present

## 2018-10-11 DIAGNOSIS — F0390 Unspecified dementia without behavioral disturbance: Secondary | ICD-10-CM | POA: Diagnosis not present

## 2018-10-12 DIAGNOSIS — D649 Anemia, unspecified: Secondary | ICD-10-CM | POA: Diagnosis not present

## 2018-10-12 DIAGNOSIS — I4891 Unspecified atrial fibrillation: Secondary | ICD-10-CM | POA: Diagnosis not present

## 2018-10-12 DIAGNOSIS — J449 Chronic obstructive pulmonary disease, unspecified: Secondary | ICD-10-CM | POA: Diagnosis not present

## 2018-10-12 DIAGNOSIS — S72142D Displaced intertrochanteric fracture of left femur, subsequent encounter for closed fracture with routine healing: Secondary | ICD-10-CM | POA: Diagnosis not present

## 2018-10-12 DIAGNOSIS — S3219XD Other fracture of sacrum, subsequent encounter for fracture with routine healing: Secondary | ICD-10-CM | POA: Diagnosis not present

## 2018-10-12 DIAGNOSIS — F0390 Unspecified dementia without behavioral disturbance: Secondary | ICD-10-CM | POA: Diagnosis not present

## 2018-10-12 DIAGNOSIS — F419 Anxiety disorder, unspecified: Secondary | ICD-10-CM | POA: Diagnosis not present

## 2018-10-14 DIAGNOSIS — I4891 Unspecified atrial fibrillation: Secondary | ICD-10-CM | POA: Diagnosis not present

## 2018-10-14 DIAGNOSIS — F419 Anxiety disorder, unspecified: Secondary | ICD-10-CM | POA: Diagnosis not present

## 2018-10-14 DIAGNOSIS — S3219XD Other fracture of sacrum, subsequent encounter for fracture with routine healing: Secondary | ICD-10-CM | POA: Diagnosis not present

## 2018-10-14 DIAGNOSIS — D649 Anemia, unspecified: Secondary | ICD-10-CM | POA: Diagnosis not present

## 2018-10-14 DIAGNOSIS — F0390 Unspecified dementia without behavioral disturbance: Secondary | ICD-10-CM | POA: Diagnosis not present

## 2018-10-14 DIAGNOSIS — J449 Chronic obstructive pulmonary disease, unspecified: Secondary | ICD-10-CM | POA: Diagnosis not present

## 2018-10-20 DIAGNOSIS — J449 Chronic obstructive pulmonary disease, unspecified: Secondary | ICD-10-CM | POA: Diagnosis not present

## 2018-10-20 DIAGNOSIS — D649 Anemia, unspecified: Secondary | ICD-10-CM | POA: Diagnosis not present

## 2018-10-20 DIAGNOSIS — S3219XD Other fracture of sacrum, subsequent encounter for fracture with routine healing: Secondary | ICD-10-CM | POA: Diagnosis not present

## 2018-10-20 DIAGNOSIS — F419 Anxiety disorder, unspecified: Secondary | ICD-10-CM | POA: Diagnosis not present

## 2018-10-20 DIAGNOSIS — F0390 Unspecified dementia without behavioral disturbance: Secondary | ICD-10-CM | POA: Diagnosis not present

## 2018-10-20 DIAGNOSIS — I4891 Unspecified atrial fibrillation: Secondary | ICD-10-CM | POA: Diagnosis not present

## 2018-10-21 DIAGNOSIS — F419 Anxiety disorder, unspecified: Secondary | ICD-10-CM | POA: Diagnosis not present

## 2018-10-21 DIAGNOSIS — I4891 Unspecified atrial fibrillation: Secondary | ICD-10-CM | POA: Diagnosis not present

## 2018-10-21 DIAGNOSIS — D649 Anemia, unspecified: Secondary | ICD-10-CM | POA: Diagnosis not present

## 2018-10-21 DIAGNOSIS — F0390 Unspecified dementia without behavioral disturbance: Secondary | ICD-10-CM | POA: Diagnosis not present

## 2018-10-21 DIAGNOSIS — J449 Chronic obstructive pulmonary disease, unspecified: Secondary | ICD-10-CM | POA: Diagnosis not present

## 2018-10-21 DIAGNOSIS — S3219XD Other fracture of sacrum, subsequent encounter for fracture with routine healing: Secondary | ICD-10-CM | POA: Diagnosis not present

## 2018-10-25 DIAGNOSIS — M545 Low back pain: Secondary | ICD-10-CM | POA: Diagnosis not present

## 2018-10-25 DIAGNOSIS — Z7409 Other reduced mobility: Secondary | ICD-10-CM | POA: Diagnosis not present

## 2018-10-25 DIAGNOSIS — E039 Hypothyroidism, unspecified: Secondary | ICD-10-CM | POA: Diagnosis not present

## 2018-10-25 DIAGNOSIS — M533 Sacrococcygeal disorders, not elsewhere classified: Secondary | ICD-10-CM | POA: Diagnosis not present

## 2018-10-25 DIAGNOSIS — F419 Anxiety disorder, unspecified: Secondary | ICD-10-CM | POA: Diagnosis not present

## 2018-10-25 DIAGNOSIS — E876 Hypokalemia: Secondary | ICD-10-CM | POA: Diagnosis not present

## 2018-10-25 DIAGNOSIS — E782 Mixed hyperlipidemia: Secondary | ICD-10-CM | POA: Diagnosis not present

## 2018-10-25 DIAGNOSIS — I4891 Unspecified atrial fibrillation: Secondary | ICD-10-CM | POA: Diagnosis not present

## 2018-10-25 DIAGNOSIS — N183 Chronic kidney disease, stage 3 (moderate): Secondary | ICD-10-CM | POA: Diagnosis not present

## 2018-10-25 DIAGNOSIS — G4700 Insomnia, unspecified: Secondary | ICD-10-CM | POA: Diagnosis not present

## 2018-10-25 DIAGNOSIS — F331 Major depressive disorder, recurrent, moderate: Secondary | ICD-10-CM | POA: Diagnosis not present

## 2018-10-25 DIAGNOSIS — Z6821 Body mass index (BMI) 21.0-21.9, adult: Secondary | ICD-10-CM | POA: Diagnosis not present

## 2018-10-25 DIAGNOSIS — J449 Chronic obstructive pulmonary disease, unspecified: Secondary | ICD-10-CM | POA: Diagnosis not present

## 2018-10-25 DIAGNOSIS — F411 Generalized anxiety disorder: Secondary | ICD-10-CM | POA: Diagnosis not present

## 2018-10-25 DIAGNOSIS — D649 Anemia, unspecified: Secondary | ICD-10-CM | POA: Diagnosis not present

## 2018-11-01 DIAGNOSIS — N183 Chronic kidney disease, stage 3 (moderate): Secondary | ICD-10-CM | POA: Diagnosis not present

## 2018-11-01 DIAGNOSIS — E039 Hypothyroidism, unspecified: Secondary | ICD-10-CM | POA: Diagnosis not present

## 2018-11-01 DIAGNOSIS — E782 Mixed hyperlipidemia: Secondary | ICD-10-CM | POA: Diagnosis not present

## 2018-11-01 DIAGNOSIS — J449 Chronic obstructive pulmonary disease, unspecified: Secondary | ICD-10-CM | POA: Diagnosis not present

## 2018-12-05 DIAGNOSIS — E039 Hypothyroidism, unspecified: Secondary | ICD-10-CM | POA: Diagnosis not present

## 2018-12-05 DIAGNOSIS — J449 Chronic obstructive pulmonary disease, unspecified: Secondary | ICD-10-CM | POA: Diagnosis not present

## 2018-12-05 DIAGNOSIS — E782 Mixed hyperlipidemia: Secondary | ICD-10-CM | POA: Diagnosis not present

## 2018-12-05 DIAGNOSIS — N183 Chronic kidney disease, stage 3 (moderate): Secondary | ICD-10-CM | POA: Diagnosis not present

## 2018-12-07 DIAGNOSIS — J449 Chronic obstructive pulmonary disease, unspecified: Secondary | ICD-10-CM | POA: Diagnosis not present

## 2018-12-07 DIAGNOSIS — N183 Chronic kidney disease, stage 3 (moderate): Secondary | ICD-10-CM | POA: Diagnosis not present

## 2018-12-07 DIAGNOSIS — E039 Hypothyroidism, unspecified: Secondary | ICD-10-CM | POA: Diagnosis not present

## 2018-12-07 DIAGNOSIS — E782 Mixed hyperlipidemia: Secondary | ICD-10-CM | POA: Diagnosis not present

## 2018-12-12 DIAGNOSIS — J449 Chronic obstructive pulmonary disease, unspecified: Secondary | ICD-10-CM | POA: Diagnosis not present

## 2018-12-12 DIAGNOSIS — J9611 Chronic respiratory failure with hypoxia: Secondary | ICD-10-CM | POA: Diagnosis not present

## 2018-12-22 DIAGNOSIS — L03116 Cellulitis of left lower limb: Secondary | ICD-10-CM | POA: Diagnosis not present

## 2019-01-23 DIAGNOSIS — N183 Chronic kidney disease, stage 3 (moderate): Secondary | ICD-10-CM | POA: Diagnosis not present

## 2019-01-23 DIAGNOSIS — E782 Mixed hyperlipidemia: Secondary | ICD-10-CM | POA: Diagnosis not present

## 2019-01-23 DIAGNOSIS — E039 Hypothyroidism, unspecified: Secondary | ICD-10-CM | POA: Diagnosis not present

## 2019-01-23 DIAGNOSIS — J449 Chronic obstructive pulmonary disease, unspecified: Secondary | ICD-10-CM | POA: Diagnosis not present

## 2019-02-01 DIAGNOSIS — J449 Chronic obstructive pulmonary disease, unspecified: Secondary | ICD-10-CM | POA: Diagnosis not present

## 2019-02-01 DIAGNOSIS — J9611 Chronic respiratory failure with hypoxia: Secondary | ICD-10-CM | POA: Diagnosis not present

## 2019-02-06 DIAGNOSIS — Z0000 Encounter for general adult medical examination without abnormal findings: Secondary | ICD-10-CM | POA: Diagnosis not present

## 2019-02-21 ENCOUNTER — Other Ambulatory Visit (HOSPITAL_COMMUNITY): Payer: Self-pay | Admitting: Internal Medicine

## 2019-02-21 DIAGNOSIS — J449 Chronic obstructive pulmonary disease, unspecified: Secondary | ICD-10-CM | POA: Diagnosis not present

## 2019-02-21 DIAGNOSIS — Z1231 Encounter for screening mammogram for malignant neoplasm of breast: Secondary | ICD-10-CM

## 2019-03-01 ENCOUNTER — Ambulatory Visit (HOSPITAL_COMMUNITY): Payer: Medicare Other

## 2019-03-02 DIAGNOSIS — E039 Hypothyroidism, unspecified: Secondary | ICD-10-CM | POA: Diagnosis not present

## 2019-03-02 DIAGNOSIS — E782 Mixed hyperlipidemia: Secondary | ICD-10-CM | POA: Diagnosis not present

## 2019-03-02 DIAGNOSIS — N183 Chronic kidney disease, stage 3 (moderate): Secondary | ICD-10-CM | POA: Diagnosis not present

## 2019-03-02 DIAGNOSIS — J449 Chronic obstructive pulmonary disease, unspecified: Secondary | ICD-10-CM | POA: Diagnosis not present

## 2019-03-24 DIAGNOSIS — N183 Chronic kidney disease, stage 3 (moderate): Secondary | ICD-10-CM | POA: Diagnosis not present

## 2019-03-24 DIAGNOSIS — E039 Hypothyroidism, unspecified: Secondary | ICD-10-CM | POA: Diagnosis not present

## 2019-03-24 DIAGNOSIS — E782 Mixed hyperlipidemia: Secondary | ICD-10-CM | POA: Diagnosis not present

## 2019-03-24 DIAGNOSIS — J449 Chronic obstructive pulmonary disease, unspecified: Secondary | ICD-10-CM | POA: Diagnosis not present

## 2019-04-06 ENCOUNTER — Other Ambulatory Visit: Payer: Self-pay

## 2019-04-27 DIAGNOSIS — E039 Hypothyroidism, unspecified: Secondary | ICD-10-CM | POA: Diagnosis not present

## 2019-04-27 DIAGNOSIS — E782 Mixed hyperlipidemia: Secondary | ICD-10-CM | POA: Diagnosis not present

## 2019-04-27 DIAGNOSIS — J449 Chronic obstructive pulmonary disease, unspecified: Secondary | ICD-10-CM | POA: Diagnosis not present

## 2019-04-27 DIAGNOSIS — N183 Chronic kidney disease, stage 3 (moderate): Secondary | ICD-10-CM | POA: Diagnosis not present

## 2019-05-05 DIAGNOSIS — J9611 Chronic respiratory failure with hypoxia: Secondary | ICD-10-CM | POA: Diagnosis not present

## 2019-05-05 DIAGNOSIS — R636 Underweight: Secondary | ICD-10-CM | POA: Diagnosis not present

## 2019-05-05 DIAGNOSIS — J449 Chronic obstructive pulmonary disease, unspecified: Secondary | ICD-10-CM | POA: Diagnosis not present

## 2019-05-05 DIAGNOSIS — Z1329 Encounter for screening for other suspected endocrine disorder: Secondary | ICD-10-CM | POA: Diagnosis not present

## 2019-05-05 DIAGNOSIS — I4891 Unspecified atrial fibrillation: Secondary | ICD-10-CM | POA: Diagnosis not present

## 2019-05-05 DIAGNOSIS — E039 Hypothyroidism, unspecified: Secondary | ICD-10-CM | POA: Diagnosis not present

## 2019-05-05 DIAGNOSIS — R7301 Impaired fasting glucose: Secondary | ICD-10-CM | POA: Diagnosis not present

## 2019-05-05 DIAGNOSIS — F419 Anxiety disorder, unspecified: Secondary | ICD-10-CM | POA: Diagnosis not present

## 2019-05-05 DIAGNOSIS — E440 Moderate protein-calorie malnutrition: Secondary | ICD-10-CM | POA: Diagnosis not present

## 2019-05-05 DIAGNOSIS — D649 Anemia, unspecified: Secondary | ICD-10-CM | POA: Diagnosis not present

## 2019-05-05 DIAGNOSIS — G4700 Insomnia, unspecified: Secondary | ICD-10-CM | POA: Diagnosis not present

## 2019-05-05 DIAGNOSIS — E782 Mixed hyperlipidemia: Secondary | ICD-10-CM | POA: Diagnosis not present

## 2019-05-05 DIAGNOSIS — F331 Major depressive disorder, recurrent, moderate: Secondary | ICD-10-CM | POA: Diagnosis not present

## 2019-05-05 DIAGNOSIS — N183 Chronic kidney disease, stage 3 (moderate): Secondary | ICD-10-CM | POA: Diagnosis not present

## 2019-05-17 DIAGNOSIS — E782 Mixed hyperlipidemia: Secondary | ICD-10-CM | POA: Diagnosis not present

## 2019-05-17 DIAGNOSIS — J449 Chronic obstructive pulmonary disease, unspecified: Secondary | ICD-10-CM | POA: Diagnosis not present

## 2019-05-17 DIAGNOSIS — N183 Chronic kidney disease, stage 3 (moderate): Secondary | ICD-10-CM | POA: Diagnosis not present

## 2019-05-17 DIAGNOSIS — E039 Hypothyroidism, unspecified: Secondary | ICD-10-CM | POA: Diagnosis not present

## 2019-06-28 DIAGNOSIS — E782 Mixed hyperlipidemia: Secondary | ICD-10-CM | POA: Diagnosis not present

## 2019-06-28 DIAGNOSIS — E039 Hypothyroidism, unspecified: Secondary | ICD-10-CM | POA: Diagnosis not present

## 2019-06-28 DIAGNOSIS — J449 Chronic obstructive pulmonary disease, unspecified: Secondary | ICD-10-CM | POA: Diagnosis not present

## 2019-07-03 DIAGNOSIS — J449 Chronic obstructive pulmonary disease, unspecified: Secondary | ICD-10-CM | POA: Diagnosis not present

## 2019-07-03 DIAGNOSIS — J9611 Chronic respiratory failure with hypoxia: Secondary | ICD-10-CM | POA: Diagnosis not present

## 2019-07-03 DIAGNOSIS — G4700 Insomnia, unspecified: Secondary | ICD-10-CM | POA: Diagnosis not present

## 2019-07-03 DIAGNOSIS — Z23 Encounter for immunization: Secondary | ICD-10-CM | POA: Diagnosis not present

## 2019-07-03 DIAGNOSIS — F419 Anxiety disorder, unspecified: Secondary | ICD-10-CM | POA: Diagnosis not present

## 2019-07-03 DIAGNOSIS — I4891 Unspecified atrial fibrillation: Secondary | ICD-10-CM | POA: Diagnosis not present

## 2019-07-31 DIAGNOSIS — J449 Chronic obstructive pulmonary disease, unspecified: Secondary | ICD-10-CM | POA: Diagnosis not present

## 2019-07-31 DIAGNOSIS — E039 Hypothyroidism, unspecified: Secondary | ICD-10-CM | POA: Diagnosis not present

## 2019-07-31 DIAGNOSIS — E782 Mixed hyperlipidemia: Secondary | ICD-10-CM | POA: Diagnosis not present

## 2019-09-27 DIAGNOSIS — J449 Chronic obstructive pulmonary disease, unspecified: Secondary | ICD-10-CM | POA: Diagnosis not present

## 2019-09-27 DIAGNOSIS — E782 Mixed hyperlipidemia: Secondary | ICD-10-CM | POA: Diagnosis not present

## 2019-09-27 DIAGNOSIS — E039 Hypothyroidism, unspecified: Secondary | ICD-10-CM | POA: Diagnosis not present

## 2019-10-24 DIAGNOSIS — E782 Mixed hyperlipidemia: Secondary | ICD-10-CM | POA: Diagnosis not present

## 2019-10-24 DIAGNOSIS — E039 Hypothyroidism, unspecified: Secondary | ICD-10-CM | POA: Diagnosis not present

## 2019-10-24 DIAGNOSIS — J449 Chronic obstructive pulmonary disease, unspecified: Secondary | ICD-10-CM | POA: Diagnosis not present

## 2019-10-27 DIAGNOSIS — E039 Hypothyroidism, unspecified: Secondary | ICD-10-CM | POA: Diagnosis not present

## 2019-10-27 DIAGNOSIS — J449 Chronic obstructive pulmonary disease, unspecified: Secondary | ICD-10-CM | POA: Diagnosis not present

## 2019-10-27 DIAGNOSIS — E782 Mixed hyperlipidemia: Secondary | ICD-10-CM | POA: Diagnosis not present

## 2019-11-13 DIAGNOSIS — E782 Mixed hyperlipidemia: Secondary | ICD-10-CM | POA: Diagnosis not present

## 2019-11-13 DIAGNOSIS — J449 Chronic obstructive pulmonary disease, unspecified: Secondary | ICD-10-CM | POA: Diagnosis not present

## 2019-11-13 DIAGNOSIS — E039 Hypothyroidism, unspecified: Secondary | ICD-10-CM | POA: Diagnosis not present

## 2019-12-13 DIAGNOSIS — E039 Hypothyroidism, unspecified: Secondary | ICD-10-CM | POA: Diagnosis not present

## 2019-12-13 DIAGNOSIS — R636 Underweight: Secondary | ICD-10-CM | POA: Diagnosis not present

## 2019-12-13 DIAGNOSIS — E876 Hypokalemia: Secondary | ICD-10-CM | POA: Diagnosis not present

## 2019-12-13 DIAGNOSIS — J9611 Chronic respiratory failure with hypoxia: Secondary | ICD-10-CM | POA: Diagnosis not present

## 2019-12-13 DIAGNOSIS — E782 Mixed hyperlipidemia: Secondary | ICD-10-CM | POA: Diagnosis not present

## 2019-12-13 DIAGNOSIS — E440 Moderate protein-calorie malnutrition: Secondary | ICD-10-CM | POA: Diagnosis not present

## 2019-12-13 DIAGNOSIS — F5101 Primary insomnia: Secondary | ICD-10-CM | POA: Diagnosis not present

## 2019-12-13 DIAGNOSIS — I4891 Unspecified atrial fibrillation: Secondary | ICD-10-CM | POA: Diagnosis not present

## 2019-12-13 DIAGNOSIS — F411 Generalized anxiety disorder: Secondary | ICD-10-CM | POA: Diagnosis not present

## 2019-12-13 DIAGNOSIS — G4700 Insomnia, unspecified: Secondary | ICD-10-CM | POA: Diagnosis not present

## 2019-12-13 DIAGNOSIS — F331 Major depressive disorder, recurrent, moderate: Secondary | ICD-10-CM | POA: Diagnosis not present

## 2019-12-13 DIAGNOSIS — D649 Anemia, unspecified: Secondary | ICD-10-CM | POA: Diagnosis not present

## 2019-12-13 DIAGNOSIS — J449 Chronic obstructive pulmonary disease, unspecified: Secondary | ICD-10-CM | POA: Diagnosis not present

## 2019-12-13 DIAGNOSIS — F419 Anxiety disorder, unspecified: Secondary | ICD-10-CM | POA: Diagnosis not present

## 2019-12-13 DIAGNOSIS — Z681 Body mass index (BMI) 19 or less, adult: Secondary | ICD-10-CM | POA: Diagnosis not present

## 2019-12-21 DIAGNOSIS — Z23 Encounter for immunization: Secondary | ICD-10-CM | POA: Diagnosis not present

## 2019-12-29 DIAGNOSIS — E039 Hypothyroidism, unspecified: Secondary | ICD-10-CM | POA: Diagnosis not present

## 2019-12-29 DIAGNOSIS — J449 Chronic obstructive pulmonary disease, unspecified: Secondary | ICD-10-CM | POA: Diagnosis not present

## 2019-12-29 DIAGNOSIS — E782 Mixed hyperlipidemia: Secondary | ICD-10-CM | POA: Diagnosis not present

## 2020-01-12 DIAGNOSIS — Z23 Encounter for immunization: Secondary | ICD-10-CM | POA: Diagnosis not present

## 2020-03-23 IMAGING — CT CT HEAD W/O CM
3 series · 15 of 46 positions shown, 18 images · non-contrast
Comparison: 02/26/2018

CLINICAL DATA: Status post fall.

EXAM:
CT HEAD WITHOUT CONTRAST
TECHNIQUE: Contiguous axial images were obtained from the base of the skull
through the vertex without intravenous contrast.

[Series 2: head trauma wo · axial · 0.41mm/px · z∈[+1396,+1516]mm · 9 of 29 slices shown, 12 images]
[im 3/29  brain]
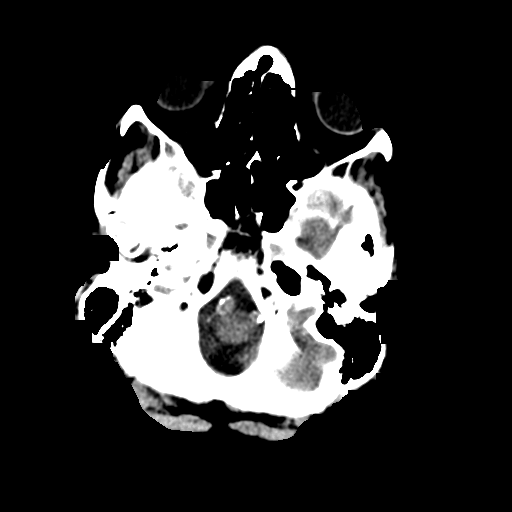
[im 3/29  bone]
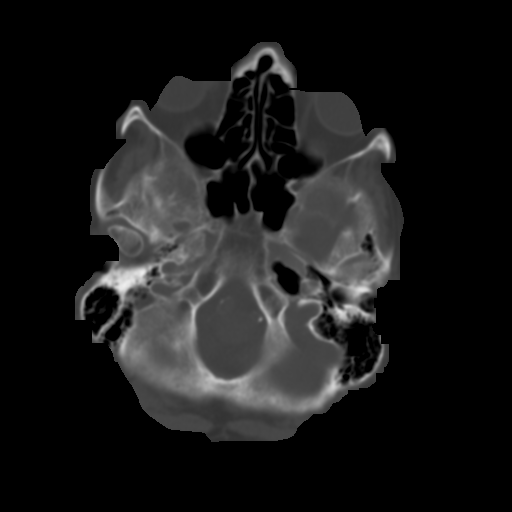
[im 6/29  brain]
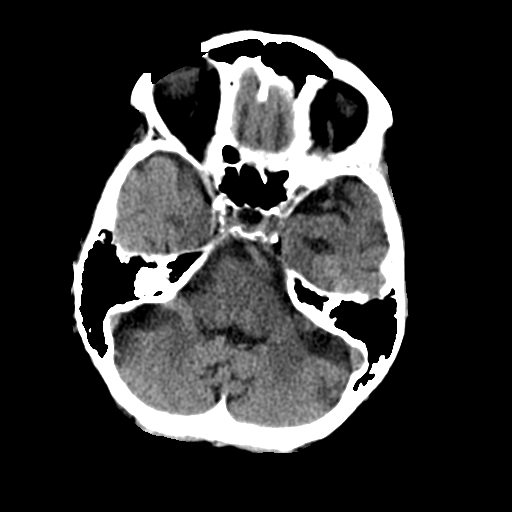
[im 9/29  brain]
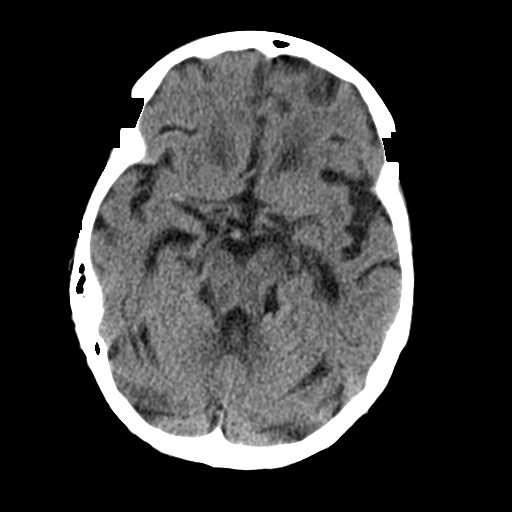
[im 12/29  brain]
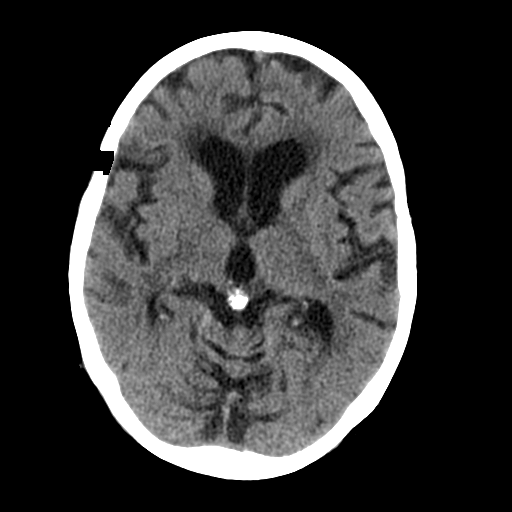
[im 15/29  brain]
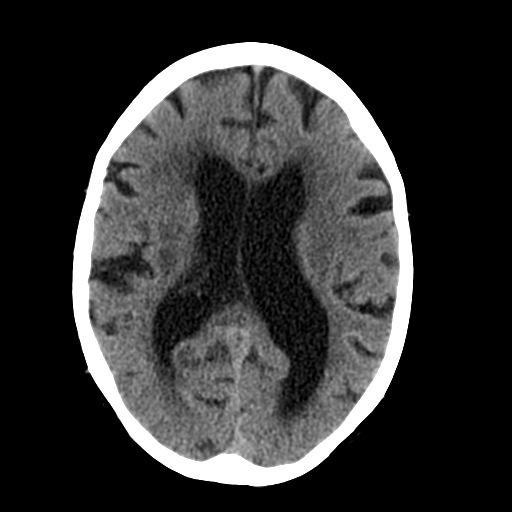
[im 15/29  bone]
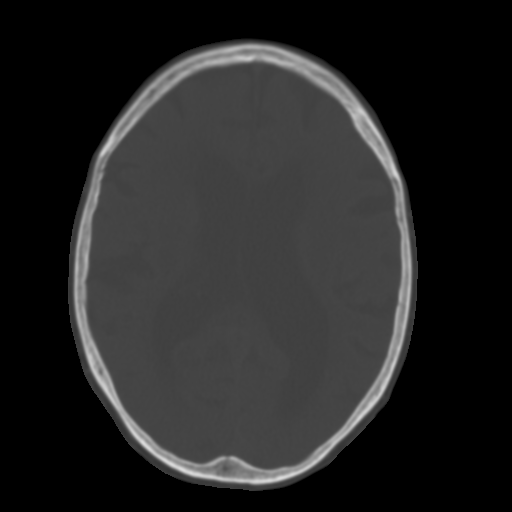
[im 18/29  brain]
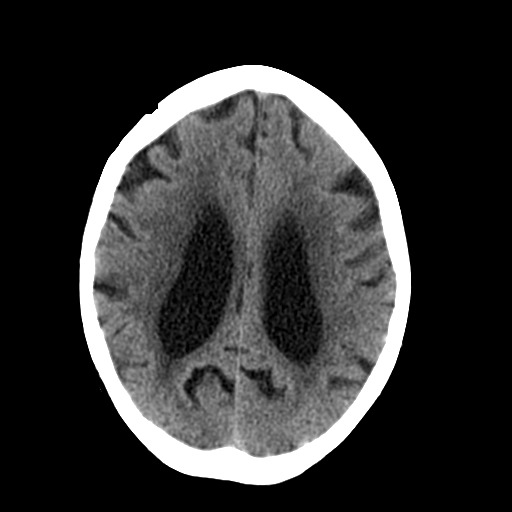
[im 21/29  brain]
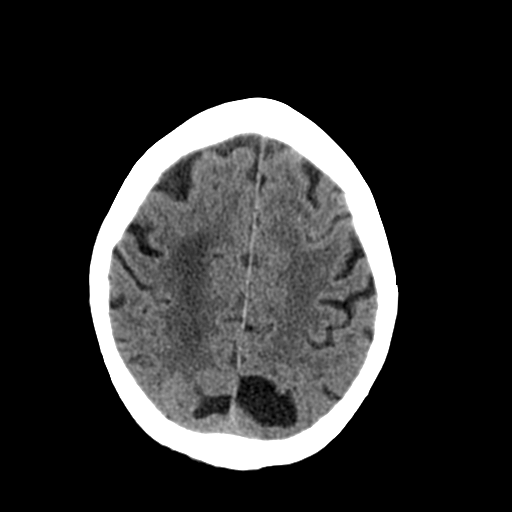
[im 24/29  brain]
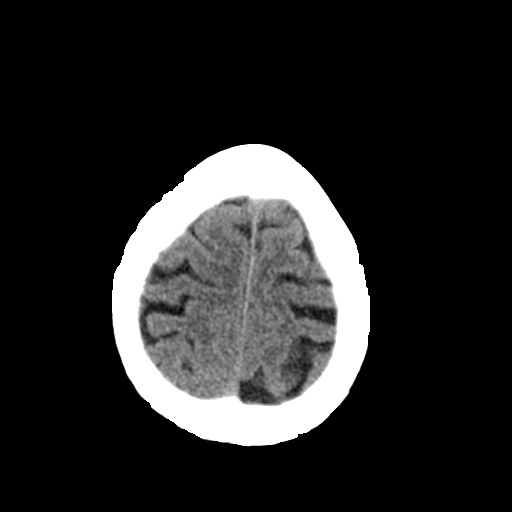
[im 27/29  brain]
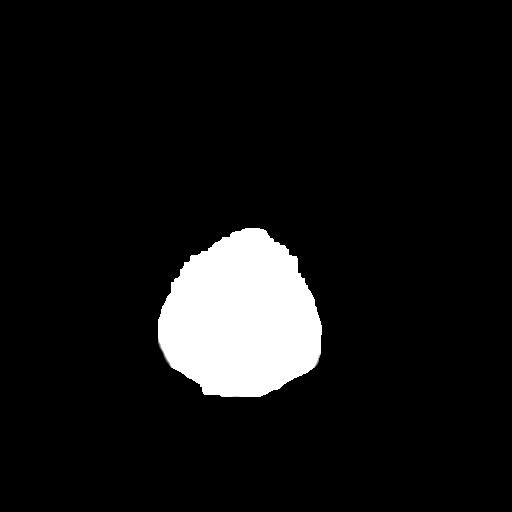
[im 27/29  bone]
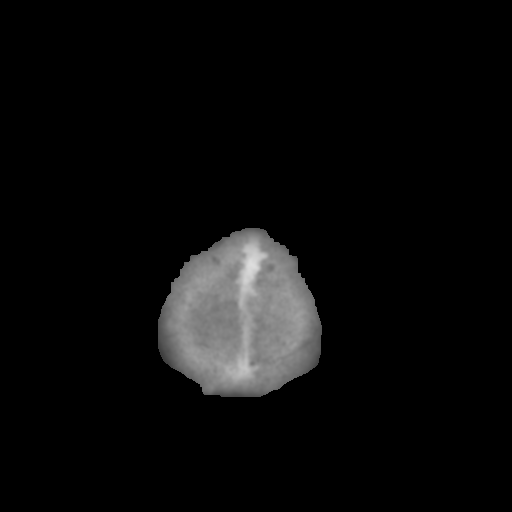

[Series 4: coronal soft tissue · coronal · 0.29mm/px · 3 of 65 slices shown]
[im 22/65  brain]
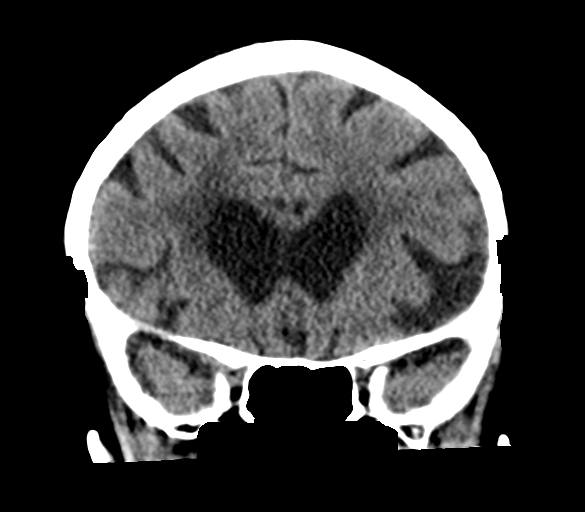
[im 29/65  brain]
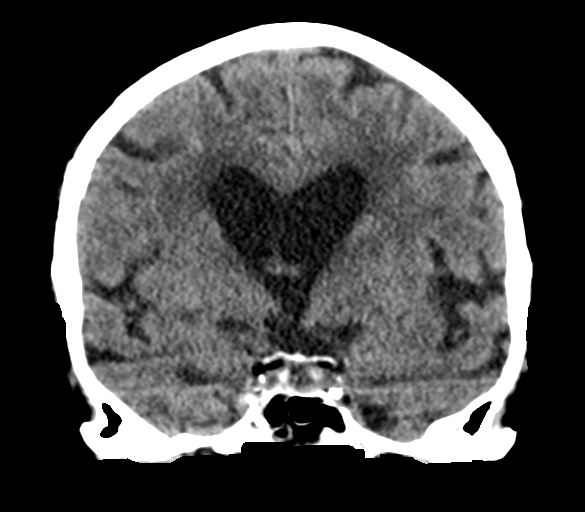
[im 36/65  brain]
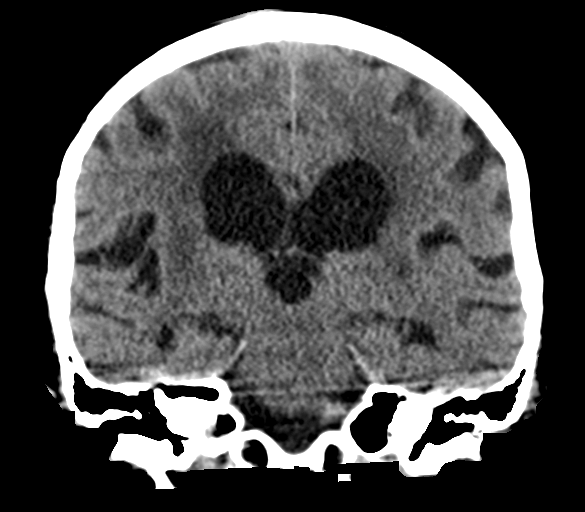

[Series 5: sagittal soft tissue · sagittal · 0.30mm/px · 3 of 55 slices shown]
[im 19/55  brain]
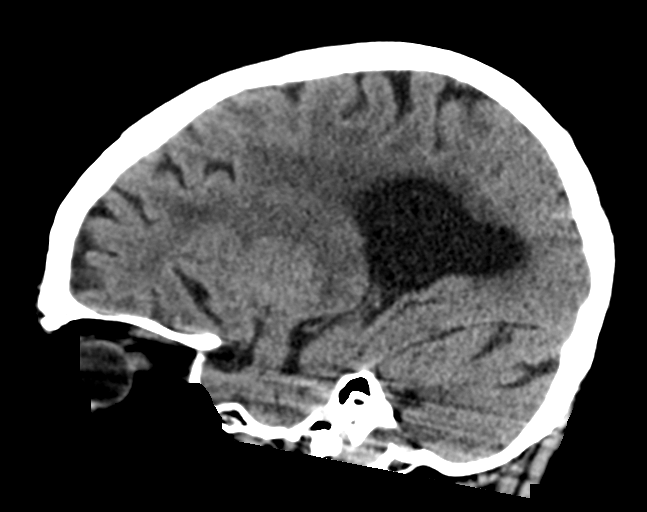
[im 28/55  brain]
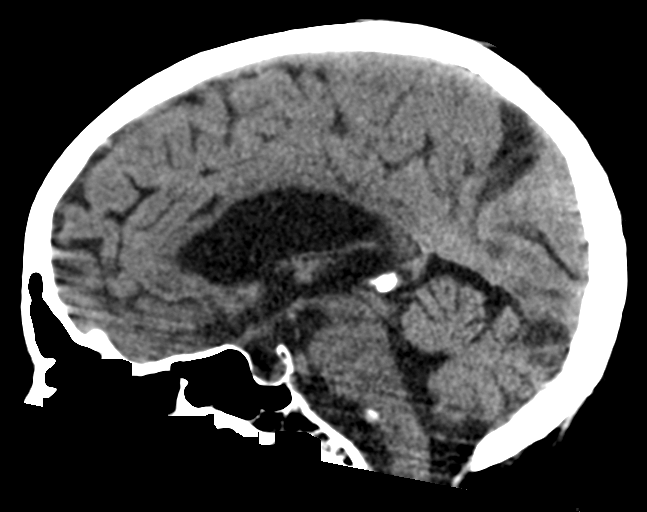
[im 37/55  brain]
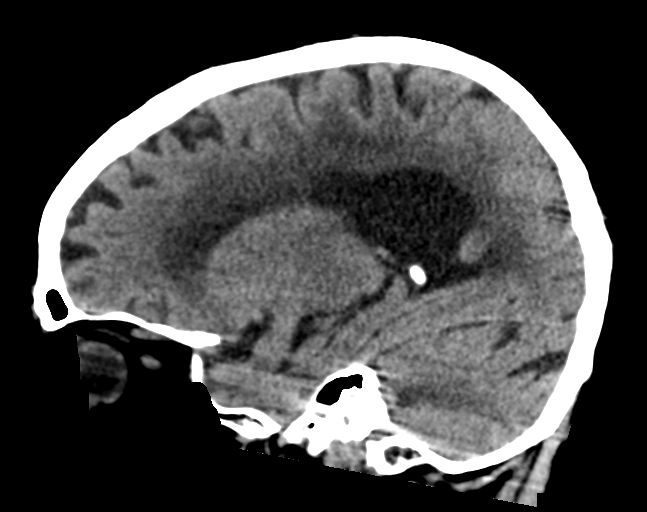

[15 of 46 positions shown; findings below may reference images not displayed]

FINDINGS: Brain: No evidence of acute infarction, hemorrhage, hydrocephalus,
extra-axial collection or mass lesion/mass effect. Moderate brain
parenchymal volume loss and moderate deep white matter
microangiopathy. Chronic enlarged sulcus along the left posterior
vertex.

Vascular: Calcific atherosclerotic disease of the intracavernous
carotid arteries.

Skull: Normal. Negative for fracture or focal lesion.

Sinuses/Orbits: No acute finding.

Other: None.
IMPRESSION: No acute intracranial abnormality.

Moderate brain parenchymal atrophy and chronic microvascular
disease.

## 2020-04-04 DIAGNOSIS — E7849 Other hyperlipidemia: Secondary | ICD-10-CM | POA: Diagnosis not present

## 2020-04-04 DIAGNOSIS — J449 Chronic obstructive pulmonary disease, unspecified: Secondary | ICD-10-CM | POA: Diagnosis not present

## 2020-04-04 DIAGNOSIS — E039 Hypothyroidism, unspecified: Secondary | ICD-10-CM | POA: Diagnosis not present

## 2020-04-30 DIAGNOSIS — J449 Chronic obstructive pulmonary disease, unspecified: Secondary | ICD-10-CM | POA: Diagnosis not present

## 2020-04-30 DIAGNOSIS — E7849 Other hyperlipidemia: Secondary | ICD-10-CM | POA: Diagnosis not present

## 2020-04-30 DIAGNOSIS — E039 Hypothyroidism, unspecified: Secondary | ICD-10-CM | POA: Diagnosis not present

## 2020-05-22 DIAGNOSIS — E7849 Other hyperlipidemia: Secondary | ICD-10-CM | POA: Diagnosis not present

## 2020-05-22 DIAGNOSIS — E039 Hypothyroidism, unspecified: Secondary | ICD-10-CM | POA: Diagnosis not present

## 2020-05-22 DIAGNOSIS — J449 Chronic obstructive pulmonary disease, unspecified: Secondary | ICD-10-CM | POA: Diagnosis not present

## 2020-06-04 ENCOUNTER — Other Ambulatory Visit: Payer: Self-pay

## 2020-06-04 ENCOUNTER — Inpatient Hospital Stay (HOSPITAL_COMMUNITY)
Admission: EM | Admit: 2020-06-04 | Discharge: 2020-06-09 | DRG: 193 | Disposition: A | Discharge status: Skilled Nursing Facility | Payer: Medicare Other | Attending: Family Medicine | Admitting: Family Medicine

## 2020-06-04 ENCOUNTER — Encounter (HOSPITAL_COMMUNITY): Payer: Self-pay

## 2020-06-04 ENCOUNTER — Emergency Department (HOSPITAL_COMMUNITY): Payer: Medicare Other

## 2020-06-04 DIAGNOSIS — Z23 Encounter for immunization: Secondary | ICD-10-CM

## 2020-06-04 DIAGNOSIS — I5189 Other ill-defined heart diseases: Secondary | ICD-10-CM | POA: Diagnosis not present

## 2020-06-04 DIAGNOSIS — J449 Chronic obstructive pulmonary disease, unspecified: Secondary | ICD-10-CM | POA: Diagnosis present

## 2020-06-04 DIAGNOSIS — F0391 Unspecified dementia with behavioral disturbance: Secondary | ICD-10-CM | POA: Diagnosis present

## 2020-06-04 DIAGNOSIS — Z9842 Cataract extraction status, left eye: Secondary | ICD-10-CM | POA: Diagnosis not present

## 2020-06-04 DIAGNOSIS — R41841 Cognitive communication deficit: Secondary | ICD-10-CM | POA: Diagnosis present

## 2020-06-04 DIAGNOSIS — Z20822 Contact with and (suspected) exposure to covid-19: Secondary | ICD-10-CM | POA: Diagnosis present

## 2020-06-04 DIAGNOSIS — E785 Hyperlipidemia, unspecified: Secondary | ICD-10-CM | POA: Diagnosis present

## 2020-06-04 DIAGNOSIS — R0902 Hypoxemia: Secondary | ICD-10-CM

## 2020-06-04 DIAGNOSIS — R2681 Unsteadiness on feet: Secondary | ICD-10-CM | POA: Diagnosis present

## 2020-06-04 DIAGNOSIS — I5030 Unspecified diastolic (congestive) heart failure: Secondary | ICD-10-CM | POA: Diagnosis present

## 2020-06-04 DIAGNOSIS — F03918 Unspecified dementia, unspecified severity, with other behavioral disturbance: Secondary | ICD-10-CM

## 2020-06-04 DIAGNOSIS — R5381 Other malaise: Secondary | ICD-10-CM | POA: Diagnosis present

## 2020-06-04 DIAGNOSIS — J440 Chronic obstructive pulmonary disease with acute lower respiratory infection: Secondary | ICD-10-CM | POA: Diagnosis present

## 2020-06-04 DIAGNOSIS — Z961 Presence of intraocular lens: Secondary | ICD-10-CM | POA: Diagnosis present

## 2020-06-04 DIAGNOSIS — Z823 Family history of stroke: Secondary | ICD-10-CM | POA: Diagnosis not present

## 2020-06-04 DIAGNOSIS — Z6821 Body mass index (BMI) 21.0-21.9, adult: Secondary | ICD-10-CM

## 2020-06-04 DIAGNOSIS — Z803 Family history of malignant neoplasm of breast: Secondary | ICD-10-CM | POA: Diagnosis not present

## 2020-06-04 DIAGNOSIS — I110 Hypertensive heart disease with heart failure: Secondary | ICD-10-CM | POA: Diagnosis present

## 2020-06-04 DIAGNOSIS — L8990 Pressure ulcer of unspecified site, unspecified stage: Secondary | ICD-10-CM | POA: Insufficient documentation

## 2020-06-04 DIAGNOSIS — I10 Essential (primary) hypertension: Secondary | ICD-10-CM | POA: Diagnosis present

## 2020-06-04 DIAGNOSIS — M6281 Muscle weakness (generalized): Secondary | ICD-10-CM | POA: Diagnosis present

## 2020-06-04 DIAGNOSIS — R531 Weakness: Secondary | ICD-10-CM | POA: Diagnosis not present

## 2020-06-04 DIAGNOSIS — I5032 Chronic diastolic (congestive) heart failure: Secondary | ICD-10-CM | POA: Diagnosis present

## 2020-06-04 DIAGNOSIS — J439 Emphysema, unspecified: Secondary | ICD-10-CM | POA: Diagnosis not present

## 2020-06-04 DIAGNOSIS — E46 Unspecified protein-calorie malnutrition: Secondary | ICD-10-CM | POA: Diagnosis present

## 2020-06-04 DIAGNOSIS — E440 Moderate protein-calorie malnutrition: Secondary | ICD-10-CM | POA: Diagnosis present

## 2020-06-04 DIAGNOSIS — Z9841 Cataract extraction status, right eye: Secondary | ICD-10-CM

## 2020-06-04 DIAGNOSIS — Z79899 Other long term (current) drug therapy: Secondary | ICD-10-CM | POA: Diagnosis not present

## 2020-06-04 DIAGNOSIS — R0602 Shortness of breath: Secondary | ICD-10-CM | POA: Diagnosis not present

## 2020-06-04 DIAGNOSIS — Z87891 Personal history of nicotine dependence: Secondary | ICD-10-CM | POA: Diagnosis not present

## 2020-06-04 DIAGNOSIS — Z8261 Family history of arthritis: Secondary | ICD-10-CM | POA: Diagnosis not present

## 2020-06-04 DIAGNOSIS — Z7401 Bed confinement status: Secondary | ICD-10-CM | POA: Diagnosis not present

## 2020-06-04 DIAGNOSIS — L89311 Pressure ulcer of right buttock, stage 1: Secondary | ICD-10-CM | POA: Diagnosis present

## 2020-06-04 DIAGNOSIS — Z91030 Bee allergy status: Secondary | ICD-10-CM | POA: Diagnosis not present

## 2020-06-04 DIAGNOSIS — E039 Hypothyroidism, unspecified: Secondary | ICD-10-CM | POA: Diagnosis present

## 2020-06-04 DIAGNOSIS — I959 Hypotension, unspecified: Secondary | ICD-10-CM | POA: Diagnosis present

## 2020-06-04 DIAGNOSIS — Z743 Need for continuous supervision: Secondary | ICD-10-CM | POA: Diagnosis not present

## 2020-06-04 DIAGNOSIS — J90 Pleural effusion, not elsewhere classified: Secondary | ICD-10-CM | POA: Diagnosis not present

## 2020-06-04 DIAGNOSIS — Z8249 Family history of ischemic heart disease and other diseases of the circulatory system: Secondary | ICD-10-CM | POA: Diagnosis not present

## 2020-06-04 DIAGNOSIS — R6889 Other general symptoms and signs: Secondary | ICD-10-CM | POA: Diagnosis not present

## 2020-06-04 DIAGNOSIS — Z90710 Acquired absence of both cervix and uterus: Secondary | ICD-10-CM

## 2020-06-04 DIAGNOSIS — J9621 Acute and chronic respiratory failure with hypoxia: Secondary | ICD-10-CM | POA: Diagnosis present

## 2020-06-04 DIAGNOSIS — J189 Pneumonia, unspecified organism: Secondary | ICD-10-CM | POA: Diagnosis present

## 2020-06-04 DIAGNOSIS — Z9981 Dependence on supplemental oxygen: Secondary | ICD-10-CM

## 2020-06-04 DIAGNOSIS — Z79890 Hormone replacement therapy (postmenopausal): Secondary | ICD-10-CM | POA: Diagnosis not present

## 2020-06-04 LAB — CBC WITH DIFFERENTIAL/PLATELET
Abs Immature Granulocytes: 0.13 10*3/uL — ABNORMAL HIGH (ref 0.00–0.07)
Basophils Absolute: 0.1 10*3/uL (ref 0.0–0.1)
Basophils Relative: 1 %
Eosinophils Absolute: 0 10*3/uL (ref 0.0–0.5)
Eosinophils Relative: 0 %
HCT: 38.6 % (ref 36.0–46.0)
Hemoglobin: 10.8 g/dL — ABNORMAL LOW (ref 12.0–15.0)
Immature Granulocytes: 2 %
Lymphocytes Relative: 11 %
Lymphs Abs: 0.8 10*3/uL (ref 0.7–4.0)
MCH: 28.9 pg (ref 26.0–34.0)
MCHC: 28 g/dL — ABNORMAL LOW (ref 30.0–36.0)
MCV: 103.2 fL — ABNORMAL HIGH (ref 80.0–100.0)
Monocytes Absolute: 0.7 10*3/uL (ref 0.1–1.0)
Monocytes Relative: 9 %
Neutro Abs: 6 10*3/uL (ref 1.7–7.7)
Neutrophils Relative %: 77 %
Platelets: 283 10*3/uL (ref 150–400)
RBC: 3.74 MIL/uL — ABNORMAL LOW (ref 3.87–5.11)
RDW: 12.3 % (ref 11.5–15.5)
WBC: 7.7 10*3/uL (ref 4.0–10.5)
nRBC: 0 % (ref 0.0–0.2)

## 2020-06-04 LAB — COMPREHENSIVE METABOLIC PANEL
ALT: 17 U/L (ref 0–44)
AST: 18 U/L (ref 15–41)
Albumin: 3.2 g/dL — ABNORMAL LOW (ref 3.5–5.0)
Alkaline Phosphatase: 50 U/L (ref 38–126)
Anion gap: 12 (ref 5–15)
BUN: 18 mg/dL (ref 8–23)
CO2: 42 mmol/L — ABNORMAL HIGH (ref 22–32)
Calcium: 8.6 mg/dL — ABNORMAL LOW (ref 8.9–10.3)
Chloride: 92 mmol/L — ABNORMAL LOW (ref 98–111)
Creatinine, Ser: 0.48 mg/dL (ref 0.44–1.00)
GFR calc Af Amer: >60 mL/min (ref >60)
GFR calc non Af Amer: >60 mL/min (ref >60)
Glucose, Bld: 96 mg/dL (ref 70–99)
Potassium: 3.6 mmol/L (ref 3.5–5.1)
Sodium: 146 mmol/L — ABNORMAL HIGH (ref 135–145)
Total Bilirubin: 1 mg/dL (ref 0.3–1.2)
Total Protein: 6.5 g/dL (ref 6.5–8.1)

## 2020-06-04 LAB — RESP PANEL BY RT PCR (RSV, FLU A&B, COVID)
Influenza A by PCR: NEGATIVE
Influenza B by PCR: NEGATIVE
Respiratory Syncytial Virus by PCR: NEGATIVE
SARS Coronavirus 2 by RT PCR: NEGATIVE

## 2020-06-04 LAB — TROPONIN I (HIGH SENSITIVITY)
Troponin I (High Sensitivity): 15 ng/L (ref <18)
Troponin I (High Sensitivity): 14 ng/L (ref <18)

## 2020-06-04 LAB — BRAIN NATRIURETIC PEPTIDE: B Natriuretic Peptide: 169 pg/mL — ABNORMAL HIGH (ref 0.0–100.0)

## 2020-06-04 MED ORDER — LABETALOL HCL 5 MG/ML IV SOLN: 5.0000 mg | INTRAVENOUS | Status: DC | PRN

## 2020-06-04 MED ORDER — POLYETHYLENE GLYCOL 3350 17 G PO PACK: 17.0000 g | PACK | Freq: Every day | ORAL | Status: DC | PRN

## 2020-06-04 MED ORDER — ENOXAPARIN SODIUM 30 MG/0.3ML ~~LOC~~ SOLN
30.0000 mg | SUBCUTANEOUS | Status: DC
  Administered 2020-06-04: 30 mg via SUBCUTANEOUS
  Filled 2020-06-04: qty 0.3

## 2020-06-04 MED ORDER — TORSEMIDE 20 MG PO TABS
20.0000 mg | ORAL_TABLET | Freq: Every day | ORAL | Status: DC
  Administered 2020-06-05 – 2020-06-09 (×5): 20 mg via ORAL
  Filled 2020-06-04 (×6): qty 1

## 2020-06-04 MED ORDER — IPRATROPIUM-ALBUTEROL 0.5-2.5 (3) MG/3ML IN SOLN: 3.0000 mL | Freq: Four times a day (QID) | RESPIRATORY_TRACT | Status: DC | PRN

## 2020-06-04 MED ORDER — SODIUM CHLORIDE 0.9 % IV SOLN
500.0000 mg | Freq: Once | INTRAVENOUS | Status: AC
  Administered 2020-06-04: 500 mg via INTRAVENOUS
  Filled 2020-06-04: qty 500

## 2020-06-04 MED ORDER — LEVOTHYROXINE SODIUM 50 MCG PO TABS
50.0000 ug | ORAL_TABLET | Freq: Every day | ORAL | Status: DC
  Administered 2020-06-05 – 2020-06-09 (×5): 50 ug via ORAL
  Filled 2020-06-04 (×5): qty 1

## 2020-06-04 MED ORDER — UMECLIDINIUM BROMIDE 62.5 MCG/INH IN AEPB
1.0000 | INHALATION_SPRAY | Freq: Every day | RESPIRATORY_TRACT | Status: DC
  Administered 2020-06-05 – 2020-06-09 (×5): 1 via RESPIRATORY_TRACT
  Filled 2020-06-04: qty 7

## 2020-06-04 MED ORDER — GUAIFENESIN ER 600 MG PO TB12
600.0000 mg | ORAL_TABLET | Freq: Two times a day (BID) | ORAL | Status: DC
  Administered 2020-06-04 – 2020-06-09 (×10): 600 mg via ORAL
  Filled 2020-06-04 (×11): qty 1

## 2020-06-04 MED ORDER — SODIUM CHLORIDE 0.9 % IV SOLN
1.0000 g | Freq: Once | INTRAVENOUS | Status: AC
  Administered 2020-06-04: 1 g via INTRAVENOUS
  Filled 2020-06-04: qty 10

## 2020-06-04 MED ORDER — SODIUM CHLORIDE 0.9 % IV SOLN
1.0000 g | INTRAVENOUS | Status: DC
  Administered 2020-06-05 – 2020-06-08 (×4): 1 g via INTRAVENOUS
  Filled 2020-06-04 (×4): qty 10

## 2020-06-04 MED ORDER — ALPRAZOLAM 0.5 MG PO TABS
0.5000 mg | ORAL_TABLET | Freq: Three times a day (TID) | ORAL | Status: DC | PRN
  Administered 2020-06-05 – 2020-06-08 (×4): 0.5 mg via ORAL
  Filled 2020-06-04 (×4): qty 1

## 2020-06-04 MED ORDER — PANTOPRAZOLE SODIUM 40 MG PO TBEC
40.0000 mg | DELAYED_RELEASE_TABLET | Freq: Every day | ORAL | Status: DC
  Administered 2020-06-05 – 2020-06-09 (×5): 40 mg via ORAL
  Filled 2020-06-04 (×5): qty 1

## 2020-06-04 MED ORDER — ADULT MULTIVITAMIN W/MINERALS CH
1.0000 | ORAL_TABLET | Freq: Every morning | ORAL | Status: DC
  Administered 2020-06-05 – 2020-06-09 (×5): 1 via ORAL
  Filled 2020-06-04 (×5): qty 1

## 2020-06-04 MED ORDER — INFLUENZA VAC A&B SA ADJ QUAD 0.5 ML IM PRSY
0.5000 mL | PREFILLED_SYRINGE | INTRAMUSCULAR | Status: AC
  Administered 2020-06-07: 0.5 mL via INTRAMUSCULAR
  Filled 2020-06-04: qty 0.5

## 2020-06-04 MED ORDER — ENSURE ENLIVE PO LIQD
237.0000 mL | Freq: Two times a day (BID) | ORAL | Status: DC
  Administered 2020-06-04 – 2020-06-09 (×8): 237 mL via ORAL
  Filled 2020-06-04 (×3): qty 237

## 2020-06-04 MED ORDER — OXYCODONE HCL 5 MG PO TABS: 5.0000 mg | ORAL_TABLET | Freq: Four times a day (QID) | ORAL | Status: DC | PRN

## 2020-06-04 MED ORDER — PRAVASTATIN SODIUM 40 MG PO TABS
40.0000 mg | ORAL_TABLET | Freq: Every day | ORAL | Status: DC
  Administered 2020-06-04 – 2020-06-08 (×5): 40 mg via ORAL
  Filled 2020-06-04 (×6): qty 1

## 2020-06-04 MED ORDER — SODIUM CHLORIDE 0.9 % IV SOLN
500.0000 mg | INTRAVENOUS | Status: DC
  Administered 2020-06-05 – 2020-06-08 (×4): 500 mg via INTRAVENOUS
  Filled 2020-06-04 (×4): qty 500

## 2020-06-04 MED ORDER — ASCORBIC ACID 500 MG PO TABS
500.0000 mg | ORAL_TABLET | Freq: Every morning | ORAL | Status: DC
  Administered 2020-06-05 – 2020-06-09 (×5): 500 mg via ORAL
  Filled 2020-06-04 (×5): qty 1

## 2020-06-04 MED ORDER — BUPROPION HCL ER (XL) 150 MG PO TB24
150.0000 mg | ORAL_TABLET | Freq: Every day | ORAL | Status: DC
  Administered 2020-06-04 – 2020-06-09 (×6): 150 mg via ORAL
  Filled 2020-06-04 (×6): qty 1

## 2020-06-04 MED ORDER — TIOTROPIUM BROMIDE MONOHYDRATE 18 MCG IN CAPS: 18.0000 ug | ORAL_CAPSULE | Freq: Every day | RESPIRATORY_TRACT | Status: DC

## 2020-06-04 MED ORDER — ACETAMINOPHEN 325 MG PO TABS: 650.0000 mg | ORAL_TABLET | Freq: Four times a day (QID) | ORAL | Status: DC | PRN

## 2020-06-04 MED ORDER — AMLODIPINE BESYLATE 5 MG PO TABS
10.0000 mg | ORAL_TABLET | Freq: Every day | ORAL | Status: DC
  Administered 2020-06-04 – 2020-06-09 (×6): 10 mg via ORAL
  Filled 2020-06-04 (×6): qty 2

## 2020-06-04 NOTE — ED Triage Notes (Signed)
Pt brought to ED via [facility] for SOB with ambulation. Pt O2 sats drop to 70's with ambulation x 2 days. Pt placed on 4L per [place].

## 2020-06-04 NOTE — ED Notes (Signed)
Hospitalist at bedside assessing patient. 

## 2020-06-04 NOTE — H&P (Signed)
History and Physical    [name] UXL:[id number] DOB: December 04, 1940 DOA: 06/04/2020  PCP: [name], MD   Patient coming from: Home    Chief Complaint: Dyspnea  HPI: [name] is a 79 y.o. female with medical history significant of diastolic congestive heart failure, COPD on home oxygen at 3 L/min, hypertension, hyperlipidemia, history of tobacco abuse, confusion at baseline who was brought from home by her son after she desaturated.  Her son noticed that yesterday her saturation was in the range of 73% .  He gave her some breathing treatment and she came back in the range of 90s.  This morning when she was assisted to go to the bathroom by her son, her saturation dropped to 54-55% and she became very dyspneic so she was brought to the emergency department.  There was no report of fever or chills at home.  Patient has been fully vaccinated against Covid.  Patient has dementia at baseline though it has not been formally diagnosed and is confused.  She ambulates  with the help of walker but since yesterday she has not been able to move out of her bed. Patient seen and examined at bedside in the emergency department.  During my evaluation she was quite hypertensive but was maintaining her saturation in 90s at 4 L of oxygen per minute.  There is no report of chest pain, palpitation, abdomen, nausea, vomiting, dysuria, diarrhea, melena or hematochezia.  ED Course: Her saturation was in the range of 70s when EMS arrived.  She was hypertensive on presentation.  No fever. CXR done in ED showed diffuse infiltrate throughout the left lung worst in the left base .  Patient was admitted for the management of community-acquired pneumonia and was started on antibiotics.  Review of Systems: As per HPI otherwise 10 point review of systems negative.    Past Medical History:  Diagnosis Date  . CHF (congestive heart failure) ([place])   . COPD (chronic obstructive pulmonary disease) ([place])    home O2  .  Diastolic dysfunction   . Dyspnea   . History of nuclear stress test 09/23/2009   dipyridamole; normal pattern of perfusion; low risk scan   . History of tobacco abuse   . Hyperlipidemia   . Hypertension   . Hypothyroidism   . Mild depression ([place])   . Pneumonia     Past Surgical History:  Procedure Laterality Date  . BIOPSY  01/13/2017   Procedure: BIOPSY;  Surgeon: Daneil Dolin, MD;  Location: [facility];  Service: Endoscopy;;  right/left/sigmoid colon  . CATARACT EXTRACTION W/PHACO  09/08/2012   Procedure: CATARACT EXTRACTION PHACO AND INTRAOCULAR LENS PLACEMENT (IOC);  Surgeon: Tonny Branch, MD;  Location: [facility];  Service: Ophthalmology;  Laterality: Right;  CDE:19.67  . CATARACT EXTRACTION W/PHACO  09/19/2012   Procedure: CATARACT EXTRACTION PHACO AND INTRAOCULAR LENS PLACEMENT (IOC);  Surgeon: Tonny Branch, MD;  Location: [facility];  Service: Ophthalmology;  Laterality: Left;  CDE:19.83  . COLONOSCOPY  2007   Dr. Gala Romney: internal hemorrhoids, benign polyps  . COLONOSCOPY N/A 01/13/2017   Procedure: COLONOSCOPY;  Surgeon: Daneil Dolin, MD;  Location: [facility];  Service: Endoscopy;  Laterality: N/A;  12:15pm  . ESOPHAGOGASTRODUODENOSCOPY  2007   Dr. Gala Romney: normal esophagus, non-critical Schatzki's ring not manipulated, normal small bowel biopsy  . ESOPHAGOGASTRODUODENOSCOPY (EGD) WITH ESOPHAGEAL DILATION N/A 10/16/2013   Dr. Fields:Schatzki's ring at the gastroesophageal junction s/p dilation/MODERATE Erosive gastritis. reactive gastropathy.   Marland Kitchen  [place]  . INTRAMEDULLARY (IM) NAIL INTERTROCHANTERIC Left 08/03/2018   Procedure: INTRAMEDULLARY (IM) NAIL INTERTROCHANTRIC;  Surgeon: Altamese [place], MD;  Location: [facility];  Service: Orthopedics;  Laterality: Left;  . right carpal tunnel release    . TONSILLECTOMY    . TOTAL ABDOMINAL HYSTERECTOMY  1980's  . TRANSTHORACIC ECHOCARDIOGRAM  09/23/2009   EF>55%; trace TR; normal LA & RA size; normal LV & RV size & systolic  function      reports that she quit smoking about 11 years ago. Her smoking use included cigarettes. She has a 50.00 pack-year smoking history. She has never used smokeless tobacco. She reports that she does not drink alcohol and does not use drugs.  Allergies  Allergen Reactions  . Bee Venom Swelling    SWELLING REACTION UNSPECIFIED     Family History  Problem Relation Age of Onset  . Allergies Father   . Heart disease Mother   . Rheum arthritis Mother   . Hypertension Mother   . Stroke Maternal Grandmother   . Breast cancer Daughter   . Breast cancer Sister   . Cancer Maternal Aunt   . Stroke Sister   . Colon cancer Neg Hx      Prior to Admission medications   Medication Sig Start Date End Date Taking? Authorizing Provider  acetaminophen (TYLENOL) 325 MG tablet Take 2 tablets (650 mg total) by mouth every 6 (six) hours as needed for mild pain (or Fever >/= 101). 02/28/18   Roxan Hockey, MD  albuterol (PROAIR HFA) 108 (90 Base) MCG/ACT inhaler Inhale 2 puffs into the lungs every 6 (six) hours as needed for wheezing or shortness of breath. Shortness of breath 03/04/17   Javier Glazier, MD  albuterol (PROVENTIL) (2.5 MG/3ML) 0.083% nebulizer solution Take 3 mLs (2.5 mg total) by nebulization 3 (three) times daily. Patient taking differently: Take 2.5 mg by nebulization every 6 (six) hours as needed for wheezing or shortness of breath.  08/14/16   Javier Glazier, MD  ALPRAZolam Duanne Moron) 0.5 MG tablet Take 1 tablet (0.5 mg total) by mouth 3 (three) times daily as needed for anxiety. 08/08/18   Samuella Cota, MD  Ascorbic Acid (VITAMIN C PO) Take 1 tablet by mouth every morning.     [provider]  buPROPion (WELLBUTRIN XL) 150 MG 24 hr tablet Take 150 mg by mouth daily.    [provider]  diltiazem (CARDIZEM CD) 120 MG 24 hr capsule Take 1 capsule (120 mg total) by mouth daily. 02/28/18 02/28/19  Roxan Hockey, MD  enoxaparin (LOVENOX) 30 MG/0.3ML  injection Inject 0.3 mLs (30 mg total) into the skin at bedtime for 24 days. Last dose 09/01/2018. 08/08/18 09/01/18  Samuella Cota, MD  feeding supplement, ENSURE ENLIVE, (ENSURE ENLIVE) LIQD Take 237 mLs by mouth 2 (two) times daily between meals. 08/08/18   Samuella Cota, MD  levothyroxine (SYNTHROID, LEVOTHROID) 50 MCG tablet Take 50 mcg by mouth daily before breakfast.    [provider]  Multiple Vitamin (MULTIVITAMIN) capsule Take 1 capsule by mouth every morning.     [provider]  oxyCODONE (OXY IR/ROXICODONE) 5 MG immediate release tablet Take 1 tablet (5 mg total) by mouth every 6 (six) hours as needed for moderate pain. 08/08/18   Samuella Cota, MD  pantoprazole (PROTONIX) 40 MG tablet Take 1 tablet (40 mg total) by mouth daily before breakfast. 02/28/18   Roxan Hockey, MD  polyethylene glycol (MIRALAX / GLYCOLAX)  packet Take 17 g by mouth 2 (two) times daily. Patient taking differently: Take 17 g by mouth daily as needed for mild constipation or moderate constipation.  02/28/18   Roxan Hockey, MD  pravastatin (PRAVACHOL) 40 MG tablet Take 1 tablet (40 mg total) by mouth at bedtime. Patient taking differently: Take 40 mg by mouth every morning.  02/28/18   Roxan Hockey, MD  tiotropium (SPIRIVA) 18 MCG inhalation capsule Place 1 capsule (18 mcg total) into inhaler and inhale daily. Patient taking differently: Place 18 mcg into inhaler and inhale daily as needed (for shortness of breath).  02/26/17 08/01/18  Javier Glazier, MD  torsemide (DEMADEX) 20 MG tablet Take 1 tablet (20 mg total) by mouth once. Patient taking differently: Take 20 mg by mouth daily.  10/30/15   Marshell Garfinkel, MD    Physical Exam: Vitals:   06/04/20 1400 06/04/20 1430 06/04/20 1500 06/04/20 1530  BP: (!) 160/62 (!) 182/67 (!) 149/52 (!) 174/64  Pulse: 87 86 88 93  Resp: (!) 27 (!) 32 (!) 26 (!) 28  Temp:      TempSrc:      SpO2: 100% 100% 100% 100%  Weight:        Height:        Constitutional: Very deconditioned, debilitated, thin, malnourished Vitals:   06/04/20 1400 06/04/20 1430 06/04/20 1500 06/04/20 1530  BP: (!) 160/62 (!) 182/67 (!) 149/52 (!) 174/64  Pulse: 87 86 88 93  Resp: (!) 27 (!) 32 (!) 26 (!) 28  Temp:      TempSrc:      SpO2: 100% 100% 100% 100%  Weight:      Height:       Eyes: PERRL, lids and conjunctivae normal ENMT: Mucous membranes are moist.  Neck: normal, supple, no masses, no thyromegaly Respiratory: Diminished air sounds bilaterally, no wheezing, no crackles. Normal respiratory effort. No accessory muscle use.  Cardiovascular: Regular rate and rhythm, no murmurs / rubs / gallops. No extremity edema.  Abdomen: no tenderness, no masses palpated. No hepatosplenomegaly. Bowel sounds positive.  Musculoskeletal: no clubbing / cyanosis. No joint deformity upper and lower extremities.  Skin: no rashes, lesions, ulcers. No induration Neurologic: CN 2-12 grossly intact.  Strength 5/5 in all 4.  Alert and awake but not oriented Foley Catheter:None  Labs on Admission: I have personally reviewed following labs and imaging studies  CBC: Recent Labs  Lab 06/04/20 1249  WBC 7.7  NEUTROABS 6.0  HGB 10.8*  HCT 38.6  MCV 103.2*  PLT 448   Basic Metabolic Panel: Recent Labs  Lab 06/04/20 1249  NA 146*  K 3.6  CL 92*  CO2 42*  GLUCOSE 96  BUN 18  CREATININE 0.48  CALCIUM 8.6*   GFR: Estimated Creatinine Clearance: 41 mL/min (by C-G formula based on SCr of 0.48 mg/dL). Liver Function Tests: Recent Labs  Lab 06/04/20 1249  AST 18  ALT 17  ALKPHOS 50  BILITOT 1.0  PROT 6.5  ALBUMIN 3.2*   No results for input(s): LIPASE, AMYLASE in the last 168 hours. No results for input(s): AMMONIA in the last 168 hours. Coagulation Profile: No results for input(s): INR, PROTIME in the last 168 hours. Cardiac Enzymes: No results for input(s): CKTOTAL, CKMB, CKMBINDEX, TROPONINI in the last 168 hours. BNP (last 3  results) No results for input(s): PROBNP in the last 8760 hours. HbA1C: No results for input(s): HGBA1C in the last 72 hours. CBG: No results for input(s): GLUCAP in the last  168 hours. Lipid Profile: No results for input(s): CHOL, HDL, LDLCALC, TRIG, CHOLHDL, LDLDIRECT in the last 72 hours. Thyroid Function Tests: No results for input(s): TSH, T4TOTAL, FREET4, T3FREE, THYROIDAB in the last 72 hours. Anemia Panel: No results for input(s): VITAMINB12, FOLATE, FERRITIN, TIBC, IRON, RETICCTPCT in the last 72 hours. Urine analysis:    Component Value Date/Time   COLORURINE YELLOW 08/01/2018 2151   APPEARANCEUR HAZY (A) 08/01/2018 2151   LABSPEC 1.009 08/01/2018 2151   PHURINE 6.0 08/01/2018 2151   GLUCOSEU NEGATIVE 08/01/2018 2151   HGBUR NEGATIVE 08/01/2018 2151   BILIRUBINUR NEGATIVE 08/01/2018 2151   KETONESUR NEGATIVE 08/01/2018 2151   PROTEINUR NEGATIVE 08/01/2018 2151   UROBILINOGEN 0.2 09/06/2007 2240   NITRITE NEGATIVE 08/01/2018 2151   LEUKOCYTESUR MODERATE (A) 08/01/2018 2151    Radiological Exams on Admission: DG Chest Portable 1 View  Result Date: 06/04/2020 CLINICAL DATA:  Shortness of breath EXAM: PORTABLE CHEST 1 VIEW COMPARISON:  08/01/2018 FINDINGS: Cardiac shadow is stable. Aortic calcifications are again seen. Patchy infiltrates are noted throughout the left lung but concentrated within the left base with associated effusion. Small right-sided effusion is noted as well. Diffuse emphysematous changes are seen. IMPRESSION: Diffuse infiltrate throughout the left lung worst in the left base with associated small effusions bilaterally. Electronically Signed   By: Inez Catalina M.D.   On: 06/04/2020 13:21     Assessment/Plan Principal Problem:   CAP (community acquired pneumonia) Active Problems:   COPD [place], frequent exacerbations   Hypertension   Hyperlipidemia   Hypothyroidism   Diastolic dysfunction   Malnutrition of moderate degree   Dementia with  behavioral disturbance (HCC)   Community-acquired pneumonia: Presented with desaturation at home.  No report of fever or chills.  Chest x-ray showed left-sided consolidation.  Started on azithromycin and ceftriaxone.  We will follow up blood cultures, streptococcal antigen, Legionella antigen in urine.  We will send sputum culture.  Currently she is saturating fine on 3-4 L of oxygen per minute.  She was not in any kind of respiratory distress during my evaluation.  COPD: Takes inhalers at home.  On 2 to 3 L of oxygen at baseline.  No wheezes on auscultation.  Continue bronchodilators and inhalers.  Chronic diastolic congestive heart failure: Her echo done in 2011 showed normal left ventricular function, impaired relaxation.  Her congestive heart failure status has been pretty stable and she is on torsemide 20 mg daily.  No recent echocardiogram found in the system. Mildly elevated BNP.  We will continue torsemide here.  Continue to monitor daily input/output, daily weight.  Hypertension: Hypertensive on presentation.  Her home medication mentioned that she was taking Cardizem in the past but not now.  We will start her on amlodipine 10 mg daily.  Hypothyroidism: Continue Synthyroid  Hyperlipidemia: On Pravastatin  Severe protein calorie malnutrition: Very emaciated and malnourished.  Will request for nutritionist evaluation.  Debility/deconditioning: Requested PT/OT evaluation.  Lives with her family.  Ambulates with the help of walker when she is at her baseline.      Severity of Illness: The appropriate patient status for this patient is INPATIENT.    DVT prophylaxis: Lovenox Code Status: Full Family Communication: Called and discussed with son on phone Consults called: None     Shelly Coss MD [facility]  06/04/2020, 3:47 PM

## 2020-06-04 NOTE — ED Provider Notes (Signed)
[facility] EMERGENCY DEPARTMENT Provider Note   CSN: [id number] Arrival date & time: 06/04/20  1232     History Chief Complaint  Patient presents with  . Shortness of Breath    [name] is a 79 y.o. female with a history of CHF, COPD on home O2 chronically between 2-3L, also with HTN, hyperlipidemia, h/o tobacco abuse, presenting with increased sob with exertion which started yesterday.  She denies chest pain, also no fevers or chills, no abd pain, n/v, palpitations.  Increased cough of grey sputum production.  She denies peripheral edema and has no increased orthopnea from her baseline.  She took and albuterol neb tx prior to arrival with improvement in her breathing.  She is unsure if she was wheezing prior to this treatment.  Per ems her o2 sats prior to arrival were in the 70's.  She has been covid vaccinated.   HPI     Past Medical History:  Diagnosis Date  . CHF (congestive heart failure) ([place])   . COPD (chronic obstructive pulmonary disease) ([place])    home O2  . Diastolic dysfunction   . Dyspnea   . History of nuclear stress test 09/23/2009   dipyridamole; normal pattern of perfusion; low risk scan   . History of tobacco abuse   . Hyperlipidemia   . Hypertension   . Hypothyroidism   . Mild depression ([place])   . Pneumonia     Patient Active Problem List   Diagnosis Date Noted  . Acute blood loss anemia 08/04/2018  . Malnutrition of moderate degree 08/02/2018  . Closed left hip fracture, initial encounter ([place]) 08/01/2018  . Diastolic dysfunction 04/20/4817  . Closed sacral fracture/S2-S3 02/27/2018  . Leg weakness, bilateral 02/26/2018  . Fall at home, initial encounter 02/26/2018  . Oral thrush 02/26/2017  . Rectal bleeding 10/21/2016  . Abdominal pain 06/06/2015  . Dysphagia   . Chronic respiratory failure with hypoxia ([place]) 02/02/2014  . Alopecia 02/02/2014  . Loss of weight 01/24/2014  . Unspecified constipation 01/24/2014  . Esophageal dysphagia  01/24/2014  . COPD [place], frequent exacerbations   . Hypertension   . Hyperlipidemia   . Hypothyroidism   . Depression     Past Surgical History:  Procedure Laterality Date  . BIOPSY  01/13/2017   Procedure: BIOPSY;  Surgeon: Daneil Dolin, MD;  Location: [facility];  Service: Endoscopy;;  right/left/sigmoid colon  . CATARACT EXTRACTION W/PHACO  09/08/2012   Procedure: CATARACT EXTRACTION PHACO AND INTRAOCULAR LENS PLACEMENT (IOC);  Surgeon: Tonny Branch, MD;  Location: [facility] ORS;  Service: Ophthalmology;  Laterality: Right;  CDE:19.67  . CATARACT EXTRACTION W/PHACO  09/19/2012   Procedure: CATARACT EXTRACTION PHACO AND INTRAOCULAR LENS PLACEMENT (IOC);  Surgeon: Tonny Branch, MD;  Location: [facility] ORS;  Service: Ophthalmology;  Laterality: Left;  CDE:19.83  . COLONOSCOPY  2007   Dr. Gala Romney: internal hemorrhoids, benign polyps  . COLONOSCOPY N/A 01/13/2017   Procedure: COLONOSCOPY;  Surgeon: Daneil Dolin, MD;  Location: [facility];  Service: Endoscopy;  Laterality: N/A;  12:15pm  . ESOPHAGOGASTRODUODENOSCOPY  2007   Dr. Gala Romney: normal esophagus, non-critical Schatzki's ring not manipulated, normal small bowel biopsy  . ESOPHAGOGASTRODUODENOSCOPY (EGD) WITH ESOPHAGEAL DILATION N/A 10/16/2013   Dr. Fields:Schatzki's ring at the gastroesophageal junction s/p dilation/MODERATE Erosive gastritis. reactive gastropathy.   Marland Kitchen Keo  . INTRAMEDULLARY (IM) NAIL INTERTROCHANTERIC Left 08/03/2018   Procedure: INTRAMEDULLARY (IM) NAIL INTERTROCHANTRIC;  Surgeon: Altamese Red Jacket, MD;  Location: [place];  Service: Orthopedics;  Laterality: Left;  . right carpal tunnel release    . TONSILLECTOMY    . TOTAL ABDOMINAL HYSTERECTOMY  1980's  . TRANSTHORACIC ECHOCARDIOGRAM  09/23/2009   EF>55%; trace TR; normal LA & RA size; normal LV & RV size & systolic function      OB History   No obstetric history on file.     Family History  Problem Relation Age of Onset  . Allergies Father   . Heart  disease Mother   . Rheum arthritis Mother   . Hypertension Mother   . Stroke Maternal Grandmother   . Breast cancer Daughter   . Breast cancer Sister   . Cancer Maternal Aunt   . Stroke Sister   . Colon cancer Neg Hx     Social History   Tobacco Use  . Smoking status: Former Smoker    Packs/day: 1.00    Years: 50.00    Pack years: 50.00    Types: Cigarettes    Quit date: 09/07/2008    Years since quitting: 11.7  . Smokeless tobacco: Never Used  Substance Use Topics  . Alcohol use: No    Alcohol/week: 0.0 standard drinks  . Drug use: No    Home Medications Prior to Admission medications   Medication Sig Start Date End Date Taking? Authorizing Provider  acetaminophen (TYLENOL) 325 MG tablet Take 2 tablets (650 mg total) by mouth every 6 (six) hours as needed for mild pain (or Fever >/= 101). 02/28/18   Roxan Hockey, MD  albuterol (PROAIR HFA) 108 (90 Base) MCG/ACT inhaler Inhale 2 puffs into the lungs every 6 (six) hours as needed for wheezing or shortness of breath. Shortness of breath 03/04/17   Javier Glazier, MD  albuterol (PROVENTIL) (2.5 MG/3ML) 0.083% nebulizer solution Take 3 mLs (2.5 mg total) by nebulization 3 (three) times daily. Patient taking differently: Take 2.5 mg by nebulization every 6 (six) hours as needed for wheezing or shortness of breath.  08/14/16   Javier Glazier, MD  ALPRAZolam Duanne Moron) 0.5 MG tablet Take 1 tablet (0.5 mg total) by mouth 3 (three) times daily as needed for anxiety. 08/08/18   Samuella Cota, MD  Ascorbic Acid (VITAMIN C PO) Take 1 tablet by mouth every morning.     [provider]  buPROPion (WELLBUTRIN XL) 150 MG 24 hr tablet Take 150 mg by mouth daily.    [provider]  diltiazem (CARDIZEM CD) 120 MG 24 hr capsule Take 1 capsule (120 mg total) by mouth daily. 02/28/18 02/28/19  Roxan Hockey, MD  enoxaparin (LOVENOX) 30 MG/0.3ML injection Inject 0.3 mLs (30 mg total) into the skin at bedtime for 24 days.  Last dose 09/01/2018. 08/08/18 09/01/18  Samuella Cota, MD  feeding supplement, ENSURE ENLIVE, (ENSURE ENLIVE) LIQD Take 237 mLs by mouth 2 (two) times daily between meals. 08/08/18   Samuella Cota, MD  levothyroxine (SYNTHROID, LEVOTHROID) 50 MCG tablet Take 50 mcg by mouth daily before breakfast.    [provider]  Multiple Vitamin (MULTIVITAMIN) capsule Take 1 capsule by mouth every morning.     [provider]  oxyCODONE (OXY IR/ROXICODONE) 5 MG immediate release tablet Take 1 tablet (5 mg total) by mouth every 6 (six) hours as needed for moderate pain. 08/08/18   Samuella Cota, MD  pantoprazole (PROTONIX) 40 MG tablet Take 1 tablet (40 mg total) by mouth daily before breakfast. 02/28/18   Roxan Hockey, MD  polyethylene glycol (MIRALAX /  GLYCOLAX) packet Take 17 g by mouth 2 (two) times daily. Patient taking differently: Take 17 g by mouth daily as needed for mild constipation or moderate constipation.  02/28/18   Roxan Hockey, MD  pravastatin (PRAVACHOL) 40 MG tablet Take 1 tablet (40 mg total) by mouth at bedtime. Patient taking differently: Take 40 mg by mouth every morning.  02/28/18   Roxan Hockey, MD  tiotropium (SPIRIVA) 18 MCG inhalation capsule Place 1 capsule (18 mcg total) into inhaler and inhale daily. Patient taking differently: Place 18 mcg into inhaler and inhale daily as needed (for shortness of breath).  02/26/17 08/01/18  Javier Glazier, MD  torsemide (DEMADEX) 20 MG tablet Take 1 tablet (20 mg total) by mouth once. Patient taking differently: Take 20 mg by mouth daily.  10/30/15   Marshell Garfinkel, MD    Allergies    Bee venom  Review of Systems   Review of Systems  Constitutional: Negative for chills and fever.  HENT: Negative for congestion.   Eyes: Negative.   Respiratory: Positive for cough and shortness of breath. Negative for chest tightness.   Cardiovascular: Negative for chest pain, palpitations and leg swelling.    Gastrointestinal: Negative for abdominal pain, nausea and vomiting.  Genitourinary: Negative.   Musculoskeletal: Negative for arthralgias, joint swelling and neck pain.  Skin: Negative.  Negative for rash and wound.  Neurological: Negative for dizziness, weakness, light-headedness, numbness and headaches.  Psychiatric/Behavioral: Negative.   All other systems reviewed and are negative.   Physical Exam Updated Vital Signs BP (!) 159/67   Pulse 87   Temp 98.2 F (36.8 C) (Oral)   Resp (!) 24   Ht 5' (1.524 m)   Wt 49.9 kg   SpO2 100%   BMI 21.48 kg/m   Physical Exam Vitals and nursing note reviewed.  Constitutional:      Appearance: She is well-developed.  HENT:     Head: Normocephalic and atraumatic.  Eyes:     Conjunctiva/sclera: Conjunctivae normal.  Cardiovascular:     Rate and Rhythm: Normal rate and regular rhythm.     Heart sounds: Normal heart sounds.  Pulmonary:     Effort: Pulmonary effort is normal.     Breath sounds: Decreased breath sounds present. No wheezing, rhonchi or rales.     Comments: Decreased breath sounds throughout.  No wheezing or rales appreciated.  Abdominal:     General: Bowel sounds are normal.     Palpations: Abdomen is soft.     Tenderness: There is no abdominal tenderness.  Musculoskeletal:        General: Normal range of motion.     Cervical back: Normal range of motion.     Right lower leg: No edema.     Left lower leg: No edema.  Skin:    General: Skin is warm and dry.  Neurological:     Mental Status: She is alert.     ED Results / Procedures / Treatments   Labs (all labs ordered are listed, but only abnormal results are displayed) Labs Reviewed  CBC WITH DIFFERENTIAL/PLATELET - Abnormal; Notable for the following components:      Result Value   RBC 3.74 (*)    Hemoglobin 10.8 (*)    MCV 103.2 (*)    MCHC 28.0 (*)    Abs Immature Granulocytes 0.13 (*)    All other components within normal limits  COMPREHENSIVE  METABOLIC PANEL - Abnormal; Notable for the following components:   Sodium 146 (*)  Chloride 92 (*)    CO2 42 (*)    Calcium 8.6 (*)    Albumin 3.2 (*)    All other components within normal limits  BRAIN NATRIURETIC PEPTIDE - Abnormal; Notable for the following components:   B Natriuretic Peptide 169.0 (*)    All other components within normal limits  RESP PANEL BY RT PCR (RSV, FLU A&B, COVID)  TROPONIN I (HIGH SENSITIVITY)  TROPONIN I (HIGH SENSITIVITY)    EKG EKG Interpretation  Date/Time:  Tuesday June 04 2020 12:39:10 EDT Ventricular Rate:  99 PR Interval:    QRS Duration: 78 QT Interval:  332 QTC Calculation: 426 R Axis:   41 Text Interpretation: Sinus tachycardia Atrial premature complexes Left ventricular hypertrophy Nonspecific T abnormalities, lateral leads , new since last tracing Confirmed by Dorie Rank [phone]) on 06/04/2020 2:58:57 PM   Radiology DG Chest Portable 1 View  Result Date: 06/04/2020 CLINICAL DATA:  Shortness of breath EXAM: PORTABLE CHEST 1 VIEW COMPARISON:  08/01/2018 FINDINGS: Cardiac shadow is stable. Aortic calcifications are again seen. Patchy infiltrates are noted throughout the left lung but concentrated within the left base with associated effusion. Small right-sided effusion is noted as well. Diffuse emphysematous changes are seen. IMPRESSION: Diffuse infiltrate throughout the left lung worst in the left base with associated small effusions bilaterally. Electronically Signed   By: Inez Catalina M.D.   On: 06/04/2020 13:21    Procedures Procedures (including critical care time)  Medications Ordered in ED Medications  cefTRIAXone (ROCEPHIN) 1 g in sodium chloride 0.9 % 100 mL IVPB (has no administration in time range)  azithromycin (ZITHROMAX) 500 mg in sodium chloride 0.9 % 250 mL IVPB (has no administration in time range)    ED Course  I have reviewed the triage vital signs and the nursing notes.  Pertinent labs & imaging results  that were available during my care of the patient were reviewed by me and considered in my medical decision making (see chart for details).    MDM Rules/Calculators/A&P                          Pt with h/o COPD and CHF, clinically she is not fluid overloaded.  She does have small effusions bilaterally.  However, sig infiltrate left lung fields suggesting CAP.  No respiratory distress at rest, pt currently on 4L [place] and comfortable with breathing, pulse ox greater than 95%.    cxr shows CAP.  Rocephin/zithromax ordered.  Pt will benefit from admission for supportive care.  Covid pending, pt states is fully vaccinated.    Discussed with Dr. Tawanna Solo who accepts pt for admission. Final Clinical Impression(s) / ED Diagnoses Final diagnoses:  Community acquired pneumonia of left lung, unspecified part of lung  Hypoxia    Rx / DC Orders ED Discharge Orders    None       Landis Martins 06/04/20 1504    Dorie Rank, MD 06/05/20 1303

## 2020-06-04 NOTE — ED Notes (Signed)
In room to see patient, change in mentation noted compared to LOC upon arrival to ED, Patient appears drowsy, does not answer questions appropriately with slowed speech.  Hospitalist contacted and made aware. No new orders at this time.

## 2020-06-05 DIAGNOSIS — E039 Hypothyroidism, unspecified: Secondary | ICD-10-CM

## 2020-06-05 DIAGNOSIS — J449 Chronic obstructive pulmonary disease, unspecified: Secondary | ICD-10-CM

## 2020-06-05 DIAGNOSIS — I10 Essential (primary) hypertension: Secondary | ICD-10-CM

## 2020-06-05 DIAGNOSIS — I5189 Other ill-defined heart diseases: Secondary | ICD-10-CM

## 2020-06-05 LAB — CBC
HCT: 35.5 % — ABNORMAL LOW (ref 36.0–46.0)
Hemoglobin: 9.6 g/dL — ABNORMAL LOW (ref 12.0–15.0)
MCH: 28.2 pg (ref 26.0–34.0)
MCHC: 27 g/dL — ABNORMAL LOW (ref 30.0–36.0)
MCV: 104.4 fL — ABNORMAL HIGH (ref 80.0–100.0)
Platelets: 281 10*3/uL (ref 150–400)
RBC: 3.4 MIL/uL — ABNORMAL LOW (ref 3.87–5.11)
RDW: 12.3 % (ref 11.5–15.5)
WBC: 8 10*3/uL (ref 4.0–10.5)
nRBC: 0 % (ref 0.0–0.2)

## 2020-06-05 LAB — BASIC METABOLIC PANEL
Anion gap: 7 (ref 5–15)
BUN: 16 mg/dL (ref 8–23)
CO2: 47 mmol/L — ABNORMAL HIGH (ref 22–32)
Calcium: 8.7 mg/dL — ABNORMAL LOW (ref 8.9–10.3)
Chloride: 91 mmol/L — ABNORMAL LOW (ref 98–111)
Creatinine, Ser: 0.47 mg/dL (ref 0.44–1.00)
GFR calc Af Amer: >60 mL/min (ref >60)
GFR calc non Af Amer: >60 mL/min (ref >60)
Glucose, Bld: 92 mg/dL (ref 70–99)
Potassium: 3.9 mmol/L (ref 3.5–5.1)
Sodium: 145 mmol/L (ref 135–145)

## 2020-06-05 MED ORDER — ENOXAPARIN SODIUM 40 MG/0.4ML ~~LOC~~ SOLN
40.0000 mg | SUBCUTANEOUS | Status: DC
  Administered 2020-06-05 – 2020-06-08 (×4): 40 mg via SUBCUTANEOUS
  Filled 2020-06-05 (×4): qty 0.4

## 2020-06-05 NOTE — Progress Notes (Signed)
[facility]  PROGRESS NOTE  [name] JOA:[id number] DOB: [birth year] DOA: 06/04/2020 PCP: [name], MD   Brief HPI:   79 year old female with medical history of diastolic heart failure, COPD on home O2 3 L/min, hypertension, hyperlipidemia, tobacco abuse was brought to the hospital for hypoxia.  As per patient's son patient's O2 sats was 73% at home.  Dropped to 55% when patient was assisted to go to bathroom.  Patient has dementia at baseline.  EMS was called and O2 sats was 70s.  She was hypotensive on presentation.  Chest x-ray in the ED showed diffuse infiltrate throughout left lung worse in the left base.  She was admitted for management of community-acquired pneumonia and started on antibiotics.   Subjective   Patient seen and examined, complains of coughing up phlegm.   Assessment/Plan:     1. Acute on chronic hypoxemic respiratory failure-chest x-ray showed large left-sided consolidation.  Patient started on ceftriaxone and Zithromax.  Blood cultures x 2 are negative to date.  Urinary strep pneumo antigen and Legionella antigen are pending. 2. Community-acquired pneumonia-chest x-ray shows large left consolidation, started on antibiotics as above.  Patient is now coughing up phlegm.  Continue Mucinex 600 mg p.o. twice daily 3. Chronic diastolic CHF-currently euvolemic, continue torsemide 20 mg p.o. daily.  Monitor intake and output.  Check BMP in a.m. 4. Hypertension-blood pressure stable, continue amlodipine. 5. Hyperlipidemia-continue pravastatin 6. COPD patient is on 3 L/min of oxygen at home.  Continue DuoNeb nebulizers every 6 hours as needed 7. Hypothyroidism-continue Synthroid.     COVID-19 Labs  No results for input(s): DDIMER, FERRITIN, LDH, CRP in the last 72 hours.  Lab Results  Component Value Date   [place] NEGATIVE 06/04/2020     Scheduled medications:   . amLODipine  10 mg Oral Daily  . vitamin C  500 mg Oral q morning - 10a  .  buPROPion  150 mg Oral Daily  . enoxaparin (LOVENOX) injection  40 mg Subcutaneous Q24H  . feeding supplement (ENSURE ENLIVE)  237 mL Oral BID BM  . guaiFENesin  600 mg Oral BID  . influenza vaccine adjuvanted  0.5 mL Intramuscular Tomorrow-1000  . levothyroxine  50 mcg Oral QAC breakfast  . multivitamin with minerals  1 tablet Oral q morning - 10a  . pantoprazole  40 mg Oral QAC breakfast  . pravastatin  40 mg Oral QHS  . torsemide  20 mg Oral Daily  . umeclidinium bromide  1 puff Inhalation Daily    SpO2: 98 % O2 Flow Rate (L/min): 4 L/min    CBC: Recent Labs  Lab 06/04/20 1249 06/05/20 0629  WBC 7.7 8.0  NEUTROABS 6.0  --   HGB 10.8* 9.6*  HCT 38.6 35.5*  MCV 103.2* 104.4*  PLT 283 601    Basic Metabolic Panel: Recent Labs  Lab 06/04/20 1249 06/05/20 0629  NA 146* 145  K 3.6 3.9  CL 92* 91*  CO2 42* 47*  GLUCOSE 96 92  BUN 18 16  CREATININE 0.48 0.47  CALCIUM 8.6* 8.7*     Liver Function Tests: Recent Labs  Lab 06/04/20 1249  AST 18  ALT 17  ALKPHOS 50  BILITOT 1.0  PROT 6.5  ALBUMIN 3.2*     Antibiotics: Anti-infectives (From admission, onward)   Start     Dose/Rate Route Frequency Ordered Stop   06/05/20 1500  azithromycin (ZITHROMAX) 500 mg in sodium chloride 0.9 % 250 mL IVPB  500 mg 250 mL/hr over 60 Minutes Intravenous Every 24 hours 06/04/20 1531     06/05/20 1400  cefTRIAXone (ROCEPHIN) 1 g in sodium chloride 0.9 % 100 mL IVPB        1 g 200 mL/hr over 30 Minutes Intravenous Every 24 hours 06/04/20 1531     06/04/20 1430  cefTRIAXone (ROCEPHIN) 1 g in sodium chloride 0.9 % 100 mL IVPB        1 g 200 mL/hr over 30 Minutes Intravenous  Once 06/04/20 1415 06/04/20 1517   06/04/20 1430  azithromycin (ZITHROMAX) 500 mg in sodium chloride 0.9 % 250 mL IVPB        500 mg 250 mL/hr over 60 Minutes Intravenous  Once 06/04/20 1415 06/04/20 1705       DVT prophylaxis: Lovenox  Code Status: Full code  Family Communication: No  family at bedside    Status is: Inpatient  Dispo: The patient is from: Home              Anticipated d/c is to: Home versus skilled nursing facility              Anticipated d/c date is: 06/08/20              Patient currently not medically stable for discharge  Barrier to discharge-receiving IV antibiotics for pneumonia ongoing management for acute on chronic hypoxemic respiratory failure  Pressure Injury 06/04/20 Buttocks Right Stage 1 -  Intact skin with non-blanchable redness of a localized area usually over a bony prominence. (Active)  06/04/20 2226  Location: Buttocks  Location Orientation: Right  Staging: Stage 1 -  Intact skin with non-blanchable redness of a localized area usually over a bony prominence.  Wound Description (Comments):   Present on Admission: Yes     Consultants:    Procedures:     Objective   Vitals:   06/05/20 0529 06/05/20 0839 06/05/20 1046 06/05/20 1352  BP: 138/61 (!) 163/53  133/61  Pulse: 86 94  94  Resp: 20 20  18   Temp: 97.8 F (36.6 C) 98.5 F (36.9 C)  98.1 F (36.7 C)  TempSrc:  Oral  Oral  SpO2: 100% 92% 93% 98%  Weight:      Height:        Intake/Output Summary (Last 24 hours) at 06/05/2020 1515 Last data filed at 06/05/2020 0800 Gross per 24 hour  Intake 470 ml  Output 200 ml  Net 270 ml    09/27 1901 - 09/29 0700 In: 350  Out: 200 [Urine:200]  Filed Weights   06/04/20 1236  Weight: 49.9 kg    Physical Examination:    General: Appears in no acute distress  Cardiovascular: S1-S2, regular, no murmur auscultated  Respiratory: Scattered rhonchi bilaterally  Abdomen: Abdomen is soft, nontender, no organomegaly  Extremities: No edema in the lower extremities  Neurologic: Alert, oriented x3,  no focal deficit noted    Data Reviewed:   Recent Results (from the past 240 hour(s))  Resp Panel by RT PCR (RSV, Flu A&B, Covid) - Nasopharyngeal Swab     Status: None   Collection Time: 06/04/20 12:52 PM    Specimen: Nasopharyngeal Swab  Result Value Ref Range Status   SARS Coronavirus 2 by RT PCR NEGATIVE NEGATIVE Final    Comment: (NOTE) SARS-CoV-2 target nucleic acids are NOT DETECTED.  The SARS-CoV-2 RNA is generally detectable in upper respiratoy specimens during the acute phase of infection. The lowest concentration of SARS-CoV-2 viral  copies this assay can detect is 131 copies/mL. A negative result does not preclude SARS-Cov-2 infection and should not be used as the sole basis for treatment or other patient management decisions. A negative result may occur with  improper specimen collection/handling, submission of specimen other than nasopharyngeal swab, presence of viral mutation(s) within the areas targeted by this assay, and inadequate number of viral copies (<131 copies/mL). A negative result must be combined with clinical observations, patient history, and epidemiological information. The expected result is Negative.  Fact Sheet for Patients:  PinkCheek.be  Fact Sheet for Healthcare Providers:  GravelBags.it  This test is no t yet approved or cleared by the [place] FDA and  has been authorized for detection and/or diagnosis of SARS-CoV-2 by FDA under an Emergency Use Authorization (EUA). This EUA will remain  in effect (meaning this test can be used) for the duration of the COVID-19 declaration under Section 564(b)(1) of the Act, 21 U.S.C. section 360bbb-3(b)(1), unless the authorization is terminated or revoked sooner.     Influenza A by PCR NEGATIVE NEGATIVE Final   Influenza B by PCR NEGATIVE NEGATIVE Final    Comment: (NOTE) The Xpert Xpress SARS-CoV-2/FLU/RSV assay is intended as an aid in  the diagnosis of influenza from Nasopharyngeal swab specimens and  should not be used as a sole basis for treatment. Nasal washings and  aspirates are unacceptable for Xpert Xpress SARS-CoV-2/FLU/RSV   testing.  Fact Sheet for Patients: PinkCheek.be  Fact Sheet for Healthcare Providers: GravelBags.it  This test is not yet approved or cleared by the [place] FDA and  has been authorized for detection and/or diagnosis of SARS-CoV-2 by  FDA under an Emergency Use Authorization (EUA). This EUA will remain  in effect (meaning this test can be used) for the duration of the  Covid-19 declaration under Section 564(b)(1) of the Act, 21  U.S.C. section 360bbb-3(b)(1), unless the authorization is  terminated or revoked.    Respiratory Syncytial Virus by PCR NEGATIVE NEGATIVE Final    Comment: (NOTE) Fact Sheet for Patients: PinkCheek.be  Fact Sheet for Healthcare Providers: GravelBags.it  This test is not yet approved or cleared by the [place] FDA and  has been authorized for detection and/or diagnosis of SARS-CoV-2 by  FDA under an Emergency Use Authorization (EUA). This EUA will remain  in effect (meaning this test can be used) for the duration of the  COVID-19 declaration under Section 564(b)(1) of the Act, 21 U.S.C.  section 360bbb-3(b)(1), unless the authorization is terminated or  revoked. Performed at [facility], [address], [place], [place] [address]   Culture, blood (routine x 2)     Status: None (Preliminary result)   Collection Time: 06/04/20  4:26 PM   Specimen: Left Antecubital; Blood  Result Value Ref Range Status   Specimen Description LEFT ANTECUBITAL  Final   Special Requests   Final    BOTTLES DRAWN AEROBIC AND ANAEROBIC Blood Culture adequate volume   Culture   Final    NO GROWTH < 24 HOURS Performed at [facility], [address], [place],  [address]    Report Status PENDING  Incomplete  Culture, blood (routine x 2)     Status: None (Preliminary result)   Collection Time: 06/04/20  4:26 PM   Specimen: BLOOD LEFT ARM   Result Value Ref Range Status   Specimen Description BLOOD LEFT ARM  Final   Special Requests   Final    BOTTLES DRAWN AEROBIC AND ANAEROBIC Blood  Culture adequate volume   Culture   Final    NO GROWTH < 24 HOURS Performed at [facility], [address] [place], [place] [address]    Report Status PENDING  Incomplete    BNP (last 3 results) Recent Labs    06/04/20 1249  BNP 169.0*     Studies:  DG Chest Portable 1 View  Result Date: 06/04/2020 CLINICAL DATA:  Shortness of breath EXAM: PORTABLE CHEST 1 VIEW COMPARISON:  08/01/2018 FINDINGS: Cardiac shadow is stable. Aortic calcifications are again seen. Patchy infiltrates are noted throughout the left lung but concentrated within the left base with associated effusion. Small right-sided effusion is noted as well. Diffuse emphysematous changes are seen. IMPRESSION: Diffuse infiltrate throughout the left lung worst in the left base with associated small effusions bilaterally. Electronically Signed   By: Inez Catalina M.D.   On: 06/04/2020 13:21       [place]   [facility] If 7PM-7AM, please contact night-coverage at [email or web address], Office  [phone]   06/05/2020, 3:15 PM  LOS: 1 day

## 2020-06-05 NOTE — Progress Notes (Signed)
Initial Nutrition Assessment  DOCUMENTATION CODES:   Non-severe (moderate) malnutrition in context of chronic illness  INTERVENTION:  Ensure Enlive po BID, each supplement provides 350 kcal and 20 grams of protein   Recommend liberalize therapeutic diet restrictions  Patient may need assistance with feeding. Recommend OT to assess.  MVI daily  NUTRITION DIAGNOSIS:   Moderate Malnutrition related to chronic illness (COPD, HF, Dementia) as evidenced by moderate fat depletion, mild muscle depletion, moderate muscle depletion, severe muscle depletion.   GOAL:  Patient will meet greater than or equal to 90% of their needs (if feasible)   MONITOR:  PO intake, Supplement acceptance, Labs, Skin, Weight trends   REASON FOR ASSESSMENT:   Malnutrition Screening Tool    ASSESSMENT: Patient is a 79 yo female with hx of COPD, Tobacco abuse, HTN, HLD, CHF and dementia. She presents with dyspnea.   Patient is pleasantly confused. Tells me she lives with husband. She also says we have not fed her today. Patient asking for ice cream- provided for her. Will follow her meal and supplement intakes.   Patient weight range 47-49 kg the past 4 years with one exception of 45.4 kg on 07/2018.   Medications reviewed and include: Vitmain C, MVI, Norvasc, Synthroid, Protonix, Demadex.  Drips: Zithromax, Rocephin  Labs: BMP Latest Ref Rng & Units 06/05/2020 06/04/2020 08/04/2018  Glucose 70 - 99 mg/dL 92 96 96  BUN 8 - 23 mg/dL 16 18 9   Creatinine 0.44 - 1.00 mg/dL 0.47 0.48 0.87  Sodium 135 - 145 mmol/L 145 146(H) 141  Potassium 3.5 - 5.1 mmol/L 3.9 3.6 4.8  Chloride 98 - 111 mmol/L 91(L) 92(L) 105  CO2 22 - 32 mmol/L 47(H) 42(H) 29  Calcium 8.9 - 10.3 mg/dL 8.7(L) 8.6(L) 8.7(L)     NUTRITION - FOCUSED PHYSICAL EXAM: Nutrition-Focused physical exam completed. Findings are severe orbital, moderate buccal fat depletion, moderate temporal, severe clavicle, deltoid and dorsal muscle depletion,  and mild BLE edema.     Diet Order:   Diet Order            Diet Heart Room service appropriate? Yes; Fluid consistency: Thin  Diet effective now                 EDUCATION NEEDS: Not appropriate for education at this time   Skin:  Skin Assessment: Reviewed RN Assessment  Stage 1 to right buttock  Last BM:  9/28  Height:   Ht Readings from Last 1 Encounters:  06/04/20 5' (1.524 m)    Weight:   Wt Readings from Last 1 Encounters:  06/04/20 49.9 kg    Ideal Body Weight:   45 kg  BMI:  Body mass index is 21.48 kg/m.  Estimated Nutritional Needs:   Kcal:  9675-9163  Protein:  70-75 gr  Fluid:  < 2 liters daily  Colman Cater MS,RD,CSG,LDN Pager: Shea Evans

## 2020-06-05 NOTE — Plan of Care (Signed)

## 2020-06-06 NOTE — Plan of Care (Signed)

## 2020-06-06 NOTE — Plan of Care (Signed)
  Problem: Acute Rehab PT Goals(only PT should resolve) Goal: Patient Will Transfer Sit To/From Stand Flowsheets (Taken 06/06/2020 1726) Patient will transfer sit to/from stand: with modified independence Goal: Pt Will Transfer Bed To Chair/Chair To Bed Flowsheets (Taken 06/06/2020 1726) Pt will Transfer Bed to Chair/Chair to Bed: with modified independence Goal: Pt Will Ambulate Flowsheets (Taken 06/06/2020 1726) Pt will Ambulate:  75 feet  with supervision  with rolling walker Goal: Pt Will Go Up/Down Stairs Flowsheets (Taken 06/06/2020 1726) Pt will Go Up / Down Stairs:  6-9 stairs  with rail(s)  with supervision   5:27 PM, 06/06/20 Josue Hector PT DPT  Physical Therapist with [facility]  [phone]

## 2020-06-06 NOTE — Evaluation (Signed)
Physical Therapy Evaluation Patient Details Name: [name] MRN: [id number] DOB: 1941-01-18 Today's Date: 06/06/2020   History of Present Illness  [name] is a 79 y.o. female with medical history significant of diastolic congestive heart failure, COPD on home oxygen at 3 L/min, hypertension, hyperlipidemia, history of tobacco abuse, confusion at baseline who was brought from home by her son after she desaturated.  Her son noticed that yesterday her saturation was in the range of 73% .  He gave her some breathing treatment and she came back in the range of 90s.  This morning when she was assisted to go to the bathroom by her son, her saturation dropped to 54-55% and she became very dyspneic so she was brought to the emergency department.  There was no report of fever or chills at home.  Patient has been fully vaccinated against Covid.  Patient has dementia at baseline though it has not been formally diagnosed and is confused.  She ambulates  with the help of walker but since yesterday she has not been able to move out of her bed.Patient seen and examined at bedside in the emergency department.  During my evaluation she was quite hypertensive but was maintaining her saturation in 90s at 4 L of oxygen per minute.  There is no report of chest pain, palpitation, abdomen, nausea, vomiting, dysuria, diarrhea, melena or hematochezia.    Clinical Impression  Patient presents supine in bed with SCDs applied. Patient is awake and alert. Patient cooperative initially. Explained to patient purpose of evaluation and reason for visit today, patient verbalized undertsanding. Patient able to perform bed mobility Mod I using hands, showing good LE positioning and weight shifting, shows good core control. Patient became agitated at this point, stating that she has had a long day and does not want to be asked any more questions. Patient able to scoot to EOB and place feet on floor, and maintain seated balance  independently. Patient then stated that was all she would do today and transferred back to supine in bed. Educated patient on importance of full therapy assessment for best informed decision on remaining rehab needs. Patient verbalized understanding but says that she is unwilling to participate any further today and would like to rest. Reapplied patient SCDs and set bed alarm, placed phone and call bell within reach. Asked patient if she needed anything else at this moment, and patient replied "where am I". Informed patient she was at [facility]. At this point, recommend SNF vs Home with 24 hour supervision, will follow up with patient at a later point to complete full assessment of activity tolerance and functional mobility. Will update DC reccommendation as indicated. Patient will benefit from continued physical therapy in hospital and recommended venue below to increase strength, balance, endurance for safe ADLs and gait.      Follow Up Recommendations SNF;Supervision/Assistance - 24 hour;Supervision for mobility/OOB    Equipment Recommendations       Recommendations for Other Services       Precautions / Restrictions Precautions Precautions: Fall Restrictions Weight Bearing Restrictions: No      Mobility  Bed Mobility Overal bed mobility: Modified Independent             General bed mobility comments: Able to perform supine to sit using hands, good weight shift and core control  Transfers                 General transfer comment: Did not observe transfer, patient  refused to leave bed  Ambulation/Gait                Stairs            Wheelchair Mobility    Modified Rankin (Stroke Patients Only)       Balance                                             Pertinent Vitals/Pain Pain Assessment: No/denies pain    Home Living Family/patient expects to be discharged to:: Private residence Living Arrangements:  Spouse/significant other Available Help at Discharge: Family;Available 24 hours/day Type of Home: House Home Access: Stairs to enter Entrance Stairs-Rails: Left Entrance Stairs-Number of Steps: 6 Home Layout: Two level;Able to live on main level with bedroom/bathroom Home Equipment: Latina Craver - 4 wheels;Tub bench;Walker - 2 wheels;Wheelchair - manual      Prior Function Level of Independence: Needs assistance   Gait / Transfers Assistance Needed: Uses rollator PRN for ambulation  ADL's / Homemaking Assistance Needed: Lives with husband who assists PRN, states her son lives near by and checks in frequently to assist as needed  Comments: Reports using 3L O2 at baseline     Hand Dominance        Extremity/Trunk Assessment   Upper Extremity Assessment Upper Extremity Assessment: Overall WFL for tasks assessed    Lower Extremity Assessment Lower Extremity Assessment: Generalized weakness       Communication   Communication: No difficulties  Cognition Arousal/Alertness: Awake/alert Behavior During Therapy: Agitated Overall Cognitive Status: Impaired/Different from baseline Area of Impairment: Orientation                 Orientation Level: Disoriented to;Place;Situation             General Comments: Asked at end of session "where am I"      General Comments General comments (skin integrity, edema, etc.): Seated balance at beside good with no UE assits, unable to asess standing balance as pateitn refused to leave bed    Exercises     Assessment/Plan    PT Assessment Patient needs continued PT services  PT Problem List Decreased strength;Decreased mobility;Decreased activity tolerance;Cardiopulmonary status limiting activity       PT Treatment Interventions DME instruction;Gait training;Stair training;Functional mobility training;Therapeutic activities;Patient/family education;Therapeutic exercise;Balance training    PT Goals (Current goals can  be found in the Care Plan section)  Acute Rehab PT Goals Patient Stated Goal: return home PT Goal Formulation: With patient Time For Goal Achievement: 06/20/20 Potential to Achieve Goals: Good    Frequency Min 3X/week   Barriers to discharge        Co-evaluation               AM-PAC PT "6 Clicks" Mobility  Outcome Measure Help needed turning from your back to your side while in a flat bed without using bedrails?: A Little Help needed moving from lying on your back to sitting on the side of a flat bed without using bedrails?: A Little Help needed moving to and from a bed to a chair (including a wheelchair)?: A Little Help needed standing up from a chair using your arms (e.g., wheelchair or bedside chair)?: A Lot Help needed to walk in hospital room?: A Lot Help needed climbing 3-5 steps with a railing? : A Lot 6 Click Score: 15  End of Session Equipment Utilized During Treatment: Oxygen Activity Tolerance: Treatment limited secondary to agitation Patient left: in bed;with SCD's reapplied;with call bell/phone within reach;with bed alarm set Nurse Communication: Mobility status PT Visit Diagnosis: Muscle weakness (generalized) (M62.81)    Time: 1630-1650 PT Time Calculation (min) (ACUTE ONLY): 20 min   Charges:   PT Evaluation $PT Eval Low Complexity: 1 Low          5:25 PM, 06/06/20 Josue Hector PT DPT  Physical Therapist with [facility]  [phone]

## 2020-06-06 NOTE — Care Management Important Message (Signed)
Important Message  Patient Details  Name: [name] MRN: [id number] Date of Birth: June 27, 1941   Medicare Important Message Given:  Yes     Tommy Medal 06/06/2020, 11:54 AM

## 2020-06-06 NOTE — Progress Notes (Addendum)
Admission [facility]  PROGRESS NOTE  [name] OFH:[id number] DOB: 1941-01-30 DOA: 06/04/2020 PCP: [name], MD   Brief HPI:   79 year old female with medical history of diastolic heart failure, COPD on home O2 3 L/min, hypertension, hyperlipidemia, tobacco abuse was brought to the hospital for hypoxia.  As per patient's son patient's O2 sats was 73% at home.  Dropped to 55% when patient was assisted to go to bathroom.  Patient has dementia at baseline.  EMS was called and O2 sats was 70s.  She was hypotensive on presentation.  Chest x-ray in the ED showed diffuse infiltrate throughout left lung worse in the left base.  She was admitted for management of community-acquired pneumonia and started on antibiotics.   Subjective   Patient seen and examined, breathing has improved.  Denies chest pain or shortness of breath.  She is back on 3 L/min of oxygen which is her baseline.   Assessment/Plan:     1. Acute on chronic hypoxemic respiratory failure-chest x-ray showed large left-sided consolidation.  Patient started on ceftriaxone and Zithromax.  Blood cultures x 2 are negative to date.  Urinary strep pneumo antigen and Legionella antigen are pending.  Will obtain PT evaluation today. 2. Community-acquired pneumonia-chest x-ray shows large left consolidation, started on antibiotics as above.  Patient is now coughing up phlegm.  Continue Mucinex 600 mg p.o. twice daily 3. Chronic diastolic CHF-currently euvolemic, continue torsemide 20 mg p.o. daily.  Monitor intake and output.  Check BMP in a.m. 4. Hypertension-blood pressure stable, continue amlodipine. 5. Hyperlipidemia-continue pravastatin 6. COPD patient is on 3 L/min of oxygen at home.  Continue DuoNeb nebulizers every 6 hours as needed 7. Hypothyroidism-continue Synthroid.     COVID-19 Labs  No results for input(s): DDIMER, FERRITIN, LDH, CRP in the last 72 hours.  Lab Results  Component Value Date   [place]  NEGATIVE 06/04/2020     Scheduled medications:   . amLODipine  10 mg Oral Daily  . vitamin C  500 mg Oral q morning - 10a  . buPROPion  150 mg Oral Daily  . enoxaparin (LOVENOX) injection  40 mg Subcutaneous Q24H  . feeding supplement (ENSURE ENLIVE)  237 mL Oral BID BM  . guaiFENesin  600 mg Oral BID  . influenza vaccine adjuvanted  0.5 mL Intramuscular Tomorrow-1000  . levothyroxine  50 mcg Oral QAC breakfast  . multivitamin with minerals  1 tablet Oral q morning - 10a  . pantoprazole  40 mg Oral QAC breakfast  . pravastatin  40 mg Oral QHS  . torsemide  20 mg Oral Daily  . umeclidinium bromide  1 puff Inhalation Daily    SpO2: 100 % O2 Flow Rate (L/min): 4 L/min    CBC: Recent Labs  Lab 06/04/20 1249 06/05/20 0629  WBC 7.7 8.0  NEUTROABS 6.0  --   HGB 10.8* 9.6*  HCT 38.6 35.5*  MCV 103.2* 104.4*  PLT 283 549    Basic Metabolic Panel: Recent Labs  Lab 06/04/20 1249 06/05/20 0629  NA 146* 145  K 3.6 3.9  CL 92* 91*  CO2 42* 47*  GLUCOSE 96 92  BUN 18 16  CREATININE 0.48 0.47  CALCIUM 8.6* 8.7*     Liver Function Tests: Recent Labs  Lab 06/04/20 1249  AST 18  ALT 17  ALKPHOS 50  BILITOT 1.0  PROT 6.5  ALBUMIN 3.2*     Antibiotics: Anti-infectives (From admission, onward)   Start     Dose/Rate  Route Frequency Ordered Stop   06/05/20 1500  azithromycin (ZITHROMAX) 500 mg in sodium chloride 0.9 % 250 mL IVPB        500 mg 250 mL/hr over 60 Minutes Intravenous Every 24 hours 06/04/20 1531     06/05/20 1400  cefTRIAXone (ROCEPHIN) 1 g in sodium chloride 0.9 % 100 mL IVPB        1 g 200 mL/hr over 30 Minutes Intravenous Every 24 hours 06/04/20 1531     06/04/20 1430  cefTRIAXone (ROCEPHIN) 1 g in sodium chloride 0.9 % 100 mL IVPB        1 g 200 mL/hr over 30 Minutes Intravenous  Once 06/04/20 1415 06/04/20 1517   06/04/20 1430  azithromycin (ZITHROMAX) 500 mg in sodium chloride 0.9 % 250 mL IVPB        500 mg 250 mL/hr over 60 Minutes  Intravenous  Once 06/04/20 1415 06/04/20 1705       DVT prophylaxis: Lovenox  Code Status: Full code  Family Communication: No family at bedside    Status is: Inpatient  Dispo: The patient is from: Home              Anticipated d/c is to: Home versus skilled nursing facility              Anticipated d/c date is: 06/07/20              Patient currently not medically stable for discharge  Barrier to discharge-receiving IV antibiotics for pneumonia ongoing management for acute on chronic hypoxemic respiratory failure.  Pending PT evaluation today  Pressure Injury 06/04/20 Buttocks Right Stage 1 -  Intact skin with non-blanchable redness of a localized area usually over a bony prominence. (Active)  06/04/20 2226  Location: Buttocks  Location Orientation: Right  Staging: Stage 1 -  Intact skin with non-blanchable redness of a localized area usually over a bony prominence.  Wound Description (Comments):   Present on Admission: Yes     Consultants:    Procedures:     Objective   Vitals:   06/05/20 2136 06/06/20 0445 06/06/20 0619 06/06/20 0744  BP: 133/82 120/68 121/67   Pulse:  86 87   Resp: 20 18 20    Temp: 97.9 F (36.6 C) 98.2 F (36.8 C) 98.7 F (37.1 C)   TempSrc: Oral Oral Oral   SpO2: 95% 100% 99% 100%  Weight:      Height:        Intake/Output Summary (Last 24 hours) at 06/06/2020 1320 Last data filed at 06/06/2020 0500 Gross per 24 hour  Intake 390 ml  Output 1250 ml  Net -860 ml    09/28 1901 - 09/30 0700 In: 750 [P.O.:750] Out: 1450 [Urine:1450]  Filed Weights   06/04/20 1236  Weight: 49.9 kg    Physical Examination:   General-appears in no acute distress Heart-S1-S2, regular, no murmur auscultated Lungs-decreased breath sounds at lung bases Abdomen-soft, nontender, no organomegaly Extremities-no edema in the lower extremities Neuro-alert, oriented x3, no focal deficit noted   Data Reviewed:   Recent Results (from the past 240  hour(s))  Resp Panel by RT PCR (RSV, Flu A&B, Covid) - Nasopharyngeal Swab     Status: None   Collection Time: 06/04/20 12:52 PM   Specimen: Nasopharyngeal Swab  Result Value Ref Range Status   SARS Coronavirus 2 by RT PCR NEGATIVE NEGATIVE Final    Comment: (NOTE) SARS-CoV-2 target nucleic acids are NOT DETECTED.  The SARS-CoV-2 RNA  is generally detectable in upper respiratoy specimens during the acute phase of infection. The lowest concentration of SARS-CoV-2 viral copies this assay can detect is 131 copies/mL. A negative result does not preclude SARS-Cov-2 infection and should not be used as the sole basis for treatment or other patient management decisions. A negative result may occur with  improper specimen collection/handling, submission of specimen other than nasopharyngeal swab, presence of viral mutation(s) within the areas targeted by this assay, and inadequate number of viral copies (<131 copies/mL). A negative result must be combined with clinical observations, patient history, and epidemiological information. The expected result is Negative.  Fact Sheet for Patients:  PinkCheek.be  Fact Sheet for Healthcare Providers:  GravelBags.it  This test is no t yet approved or cleared by the [place] FDA and  has been authorized for detection and/or diagnosis of SARS-CoV-2 by FDA under an Emergency Use Authorization (EUA). This EUA will remain  in effect (meaning this test can be used) for the duration of the COVID-19 declaration under Section 564(b)(1) of the Act, 21 U.S.C. section 360bbb-3(b)(1), unless the authorization is terminated or revoked sooner.     Influenza A by PCR NEGATIVE NEGATIVE Final   Influenza B by PCR NEGATIVE NEGATIVE Final    Comment: (NOTE) The Xpert Xpress SARS-CoV-2/FLU/RSV assay is intended as an aid in  the diagnosis of influenza from Nasopharyngeal swab specimens and  should not be  used as a sole basis for treatment. Nasal washings and  aspirates are unacceptable for Xpert Xpress SARS-CoV-2/FLU/RSV  testing.  Fact Sheet for Patients: PinkCheek.be  Fact Sheet for Healthcare Providers: GravelBags.it  This test is not yet approved or cleared by the [place] FDA and  has been authorized for detection and/or diagnosis of SARS-CoV-2 by  FDA under an Emergency Use Authorization (EUA). This EUA will remain  in effect (meaning this test can be used) for the duration of the  Covid-19 declaration under Section 564(b)(1) of the Act, 21  U.S.C. section 360bbb-3(b)(1), unless the authorization is  terminated or revoked.    Respiratory Syncytial Virus by PCR NEGATIVE NEGATIVE Final    Comment: (NOTE) Fact Sheet for Patients: PinkCheek.be  Fact Sheet for Healthcare Providers: GravelBags.it  This test is not yet approved or cleared by the [place] FDA and  has been authorized for detection and/or diagnosis of SARS-CoV-2 by  FDA under an Emergency Use Authorization (EUA). This EUA will remain  in effect (meaning this test can be used) for the duration of the  COVID-19 declaration under Section 564(b)(1) of the Act, 21 U.S.C.  section 360bbb-3(b)(1), unless the authorization is terminated or  revoked. Performed at [facility], [address], [place], [place] [address]   Culture, blood (routine x 2)     Status: None (Preliminary result)   Collection Time: 06/04/20  4:26 PM   Specimen: Left Antecubital; Blood  Result Value Ref Range Status   Specimen Description LEFT ANTECUBITAL  Final   Special Requests   Final    BOTTLES DRAWN AEROBIC AND ANAEROBIC Blood Culture adequate volume   Culture   Final    NO GROWTH 2 DAYS Performed at [facility], [address], [place],  [address]    Report Status PENDING  Incomplete  Culture, blood (routine  x 2)     Status: None (Preliminary result)   Collection Time: 06/04/20  4:26 PM   Specimen: BLOOD LEFT ARM  Result Value Ref Range Status   Specimen Description BLOOD LEFT ARM  Final   Special Requests   Final    BOTTLES DRAWN AEROBIC AND ANAEROBIC Blood Culture adequate volume   Culture   Final    NO GROWTH 2 DAYS Performed at [facility], [address], [facility], [place] [address]    Report Status PENDING  Incomplete    BNP (last 3 results) Recent Labs    06/04/20 1249  BNP 169.0*     Woods Gangemi S Joziah Dollins   [facility] If 7PM-7AM, please contact night-coverage at [email or web address], Office  [phone]   06/06/2020, 1:20 PM  LOS: 2 days

## 2020-06-06 NOTE — Plan of Care (Signed)
  Problem: Clinical Measurements: Goal: Will remain free from infection Outcome: Progressing   Problem: Clinical Measurements: Goal: Respiratory complications will improve Outcome: Progressing   Problem: Coping: Goal: Level of anxiety will decrease Outcome: Progressing   Problem: Nutrition: Goal: Adequate nutrition will be maintained Outcome: Progressing   Problem: Safety: Goal: Ability to remain free from injury will improve Outcome: Progressing   Problem: Pain Managment: Goal: General experience of comfort will improve Outcome: Progressing   Problem: Skin Integrity: Goal: Risk for impaired skin integrity will decrease Outcome: Progressing

## 2020-06-07 NOTE — Progress Notes (Signed)
Admission triad Hospitalist  PROGRESS NOTE  [name] WNI:[id number] DOB: 1941/08/15 DOA: 06/04/2020 PCP: [name], MD   Brief HPI:   79 year old female with medical history of diastolic heart failure, COPD on home O2 3 L/min, hypertension, hyperlipidemia, tobacco abuse was brought to the hospital for hypoxia.  As per patient's son patient's O2 sats was 73% at home.  Dropped to 55% when patient was assisted to go to bathroom.  Patient has dementia at baseline.  EMS was called and O2 sats was 70s.  She was hypotensive on presentation.  Chest x-ray in the ED showed diffuse infiltrate throughout left lung worse in the left base.  She was admitted for management of community-acquired pneumonia and started on antibiotics.   Subjective   Patient seen and examined, denies any complaints.  Breathing is improved.  She is back on 3 L pulmonary of oxygen which is her baseline.   Assessment/Plan:     1. Acute on chronic hypoxemic respiratory failure-chest x-ray showed large left-sided consolidation.  Patient started on ceftriaxone and Zithromax.  Blood cultures x 2 are negative to date.  Urinary strep pneumo antigen and Legionella antigen are pending.  PT evaluation done, patient recommended to go to skilled nursing facility. 2. Community-acquired pneumonia-chest x-ray shows large left consolidation, started on antibiotics as above.  Patient is now coughing up phlegm.  Continue Mucinex 600 mg p.o. twice daily 3. Chronic diastolic CHF-currently euvolemic, continue torsemide 20 mg p.o. daily.  Monitor intake and output.  Check BMP in a.m. 4. Hypertension-blood pressure stable, continue amlodipine. 5. Hyperlipidemia-continue pravastatin 6. COPD patient is on 3 L/min of oxygen at home.  Continue DuoNeb nebulizers every 6 hours as needed 7. Hypothyroidism-continue Synthroid.     COVID-19 Labs  No results for input(s): DDIMER, FERRITIN, LDH, CRP in the last 72 hours.  Lab Results  Component  Value Date   [place] NEGATIVE 06/04/2020     Scheduled medications:   . amLODipine  10 mg Oral Daily  . vitamin C  500 mg Oral q morning - 10a  . buPROPion  150 mg Oral Daily  . enoxaparin (LOVENOX) injection  40 mg Subcutaneous Q24H  . feeding supplement (ENSURE ENLIVE)  237 mL Oral BID BM  . guaiFENesin  600 mg Oral BID  . levothyroxine  50 mcg Oral QAC breakfast  . multivitamin with minerals  1 tablet Oral q morning - 10a  . pantoprazole  40 mg Oral QAC breakfast  . pravastatin  40 mg Oral QHS  . torsemide  20 mg Oral Daily  . umeclidinium bromide  1 puff Inhalation Daily    SpO2: 99 % O2 Flow Rate (L/min): 3 L/min    CBC: Recent Labs  Lab 06/04/20 1249 06/05/20 0629  WBC 7.7 8.0  NEUTROABS 6.0  --   HGB 10.8* 9.6*  HCT 38.6 35.5*  MCV 103.2* 104.4*  PLT 283 381    Basic Metabolic Panel: Recent Labs  Lab 06/04/20 1249 06/05/20 0629  NA 146* 145  K 3.6 3.9  CL 92* 91*  CO2 42* 47*  GLUCOSE 96 92  BUN 18 16  CREATININE 0.48 0.47  CALCIUM 8.6* 8.7*     Liver Function Tests: Recent Labs  Lab 06/04/20 1249  AST 18  ALT 17  ALKPHOS 50  BILITOT 1.0  PROT 6.5  ALBUMIN 3.2*     Antibiotics: Anti-infectives (From admission, onward)   Start     Dose/Rate Route Frequency Ordered Stop   06/05/20  1500  azithromycin (ZITHROMAX) 500 mg in sodium chloride 0.9 % 250 mL IVPB        500 mg 250 mL/hr over 60 Minutes Intravenous Every 24 hours 06/04/20 1531     06/05/20 1400  cefTRIAXone (ROCEPHIN) 1 g in sodium chloride 0.9 % 100 mL IVPB        1 g 200 mL/hr over 30 Minutes Intravenous Every 24 hours 06/04/20 1531     06/04/20 1430  cefTRIAXone (ROCEPHIN) 1 g in sodium chloride 0.9 % 100 mL IVPB        1 g 200 mL/hr over 30 Minutes Intravenous  Once 06/04/20 1415 06/04/20 1517   06/04/20 1430  azithromycin (ZITHROMAX) 500 mg in sodium chloride 0.9 % 250 mL IVPB        500 mg 250 mL/hr over 60 Minutes Intravenous  Once 06/04/20 1415 06/04/20 1705        DVT prophylaxis: Lovenox  Code Status: Full code  Family Communication: No family at bedside    Status is: Inpatient  Dispo: The patient is from: Home              Anticipated d/c is to: Home versus skilled nursing facility              Anticipated d/c date is: 06/10/2020              Patient currently not medically stable for discharge  Barrier to discharge-receiving IV antibiotics for pneumonia ongoing management for acute on chronic hypoxemic respiratory failure.  Pending PT evaluation today  Pressure Injury 06/04/20 Buttocks Right Stage 1 -  Intact skin with non-blanchable redness of a localized area usually over a bony prominence. (Active)  06/04/20 2226  Location: Buttocks  Location Orientation: Right  Staging: Stage 1 -  Intact skin with non-blanchable redness of a localized area usually over a bony prominence.  Wound Description (Comments):   Present on Admission: Yes     Consultants:    Procedures:     Objective   Vitals:   06/06/20 1457 06/06/20 2100 06/07/20 0600 06/07/20 0804  BP: 127/75 129/66 (!) 127/56   Pulse: 88 91 81   Resp: 18 20 20    Temp: 98.3 F (36.8 C) 98.4 F (36.9 C) 98.3 F (36.8 C)   TempSrc: Oral     SpO2: 98% 98% 98% 99%  Weight:      Height:        Intake/Output Summary (Last 24 hours) at 06/07/2020 1212 Last data filed at 06/07/2020 1610 Gross per 24 hour  Intake --  Output 300 ml  Net -300 ml    09/29 1901 - 10/01 0700 In: 390 [P.O.:390] Out: 450 [Urine:450]  Filed Weights   06/04/20 1236  Weight: 49.9 kg    Physical Examination:   General-appears in no acute distress Heart-S1-S2, regular, no murmur auscultated Lungs-clear to auscultation bilaterally, no wheezing or crackles auscultated Abdomen-soft, nontender, no organomegaly Extremities-no edema in the lower extremities Neuro-alert, oriented x3, no focal deficit noted  Data Reviewed:   Recent Results (from the past 240 hour(s))  Resp Panel by RT  PCR (RSV, Flu A&B, Covid) - Nasopharyngeal Swab     Status: None   Collection Time: 06/04/20 12:52 PM   Specimen: Nasopharyngeal Swab  Result Value Ref Range Status   SARS Coronavirus 2 by RT PCR NEGATIVE NEGATIVE Final    Comment: (NOTE) SARS-CoV-2 target nucleic acids are NOT DETECTED.  The SARS-CoV-2 RNA is generally detectable in upper  respiratoy specimens during the acute phase of infection. The lowest concentration of SARS-CoV-2 viral copies this assay can detect is 131 copies/mL. A negative result does not preclude SARS-Cov-2 infection and should not be used as the sole basis for treatment or other patient management decisions. A negative result may occur with  improper specimen collection/handling, submission of specimen other than nasopharyngeal swab, presence of viral mutation(s) within the areas targeted by this assay, and inadequate number of viral copies (<131 copies/mL). A negative result must be combined with clinical observations, patient history, and epidemiological information. The expected result is Negative.  Fact Sheet for Patients:  PinkCheek.be  Fact Sheet for Healthcare Providers:  GravelBags.it  This test is no t yet approved or cleared by the [place] FDA and  has been authorized for detection and/or diagnosis of SARS-CoV-2 by FDA under an Emergency Use Authorization (EUA). This EUA will remain  in effect (meaning this test can be used) for the duration of the COVID-19 declaration under Section 564(b)(1) of the Act, 21 U.S.C. section 360bbb-3(b)(1), unless the authorization is terminated or revoked sooner.     Influenza A by PCR NEGATIVE NEGATIVE Final   Influenza B by PCR NEGATIVE NEGATIVE Final    Comment: (NOTE) The Xpert Xpress SARS-CoV-2/FLU/RSV assay is intended as an aid in  the diagnosis of influenza from Nasopharyngeal swab specimens and  should not be used as a sole basis for  treatment. Nasal washings and  aspirates are unacceptable for Xpert Xpress SARS-CoV-2/FLU/RSV  testing.  Fact Sheet for Patients: PinkCheek.be  Fact Sheet for Healthcare Providers: GravelBags.it  This test is not yet approved or cleared by the [place] FDA and  has been authorized for detection and/or diagnosis of SARS-CoV-2 by  FDA under an Emergency Use Authorization (EUA). This EUA will remain  in effect (meaning this test can be used) for the duration of the  Covid-19 declaration under Section 564(b)(1) of the Act, 21  U.S.C. section 360bbb-3(b)(1), unless the authorization is  terminated or revoked.    Respiratory Syncytial Virus by PCR NEGATIVE NEGATIVE Final    Comment: (NOTE) Fact Sheet for Patients: PinkCheek.be  Fact Sheet for Healthcare Providers: GravelBags.it  This test is not yet approved or cleared by the [place] FDA and  has been authorized for detection and/or diagnosis of SARS-CoV-2 by  FDA under an Emergency Use Authorization (EUA). This EUA will remain  in effect (meaning this test can be used) for the duration of the  COVID-19 declaration under Section 564(b)(1) of the Act, 21 U.S.C.  section 360bbb-3(b)(1), unless the authorization is terminated or  revoked. Performed at [facility], [address] [place], [place] [address]   Culture, blood (routine x 2)     Status: None (Preliminary result)   Collection Time: 06/04/20  4:26 PM   Specimen: Left Antecubital; Blood  Result Value Ref Range Status   Specimen Description LEFT ANTECUBITAL  Final   Special Requests   Final    BOTTLES DRAWN AEROBIC AND ANAEROBIC Blood Culture adequate volume   Culture   Final    NO GROWTH 3 DAYS Performed at [facility], [address] [place], [place] [address]    Report Status PENDING  Incomplete  Culture, blood (routine x 2)     Status: None  (Preliminary result)   Collection Time: 06/04/20  4:26 PM   Specimen: BLOOD LEFT ARM  Result Value Ref Range Status   Specimen Description BLOOD LEFT ARM  Final   Special  Requests   Final    BOTTLES DRAWN AEROBIC AND ANAEROBIC Blood Culture adequate volume   Culture   Final    NO GROWTH 3 DAYS Performed at [facility], [address], [place], [place] [address]    Report Status PENDING  Incomplete    BNP (last 3 results) Recent Labs    06/04/20 1249  BNP 169.0*     [name] S Tamryn Popko   [facility] If 7PM-7AM, please contact night-coverage at [email or web address], Office  [phone]   06/07/2020, 12:12 PM  LOS: 3 days

## 2020-06-07 NOTE — NC FL2 (Signed)
[place] LEVEL OF CARE SCREENING TOOL     IDENTIFICATION  Patient Name: [name] Birthdate: 09-10-1940 Sex: female Admission Date (Current Location): 06/04/2020  [facility] and [place] Number:  [employer] and Address:  [place] [address], [place]      Provider Number: [id number]  Attending Physician Name and Address:  Oswald Hillock, MD  Relative Name and Phone Number:  Scherrie Bateman, daughter, [phone]    Current Level of Care: Hospital Recommended Level of Care: [place] Prior Approval Number:    Date Approved/Denied:   PASRR Number: [id number]  Discharge Plan: SNF    Current Diagnoses: Patient Active Problem List   Diagnosis Date Noted   CAP (community acquired pneumonia) 06/04/2020   Dementia with behavioral disturbance ([place]) 06/04/2020   Acute blood loss anemia 08/04/2018   Malnutrition of moderate degree 08/02/2018   Closed left hip fracture, initial encounter ([place]) 73/53/2992   Diastolic dysfunction 42/68/3419   Closed sacral fracture/S2-S3 02/27/2018   Leg weakness, bilateral 02/26/2018   Fall at home, initial encounter 02/26/2018   Oral thrush 02/26/2017   Rectal bleeding 10/21/2016   Abdominal pain 06/06/2015   Dysphagia    Chronic respiratory failure with hypoxia ([place]) 02/02/2014   Alopecia 02/02/2014   Loss of weight 01/24/2014   Unspecified constipation 01/24/2014   Esophageal dysphagia 01/24/2014   COPD Golds D, frequent exacerbations    Hypertension    Hyperlipidemia    Hypothyroidism    Depression     Orientation RESPIRATION BLADDER Height & Weight     Self, Place  O2 (3L) Incontinent Weight: 110 lb (49.9 kg) Height:  5' (152.4 cm)  BEHAVIORAL SYMPTOMS/MOOD NEUROLOGICAL BOWEL NUTRITION STATUS      Incontinent Diet (heart healthy)  AMBULATORY STATUS COMMUNICATION OF NEEDS Skin   Limited Assist Verbally PU Stage and Appropriate Care  (Stage 1 buttocks, right)                       Personal Care Assistance Level of Assistance  Bathing, Dressing, Feeding Bathing Assistance: Limited assistance Feeding assistance: Limited assistance Dressing Assistance: Limited assistance     Functional Limitations Info  Sight, Hearing, Speech Sight Info: Adequate Hearing Info: Adequate Speech Info: Adequate    SPECIAL CARE FACTORS FREQUENCY  PT (By licensed PT)     PT Frequency: 5x/week              Contractures Contractures Info: Not present    Additional Factors Info  Code Status, Allergies Code Status Info: Full Code Allergies Info: Bee Venom           Current Medications (06/07/2020):  This is the current hospital active medication list Current Facility-Administered Medications  Medication Dose Route Frequency Provider Last Rate Last Admin   acetaminophen (TYLENOL) tablet 650 mg  650 mg Oral Q6H PRN Shelly Coss, MD       ALPRAZolam Duanne Moron) tablet 0.5 mg  0.5 mg Oral TID PRN Shelly Coss, MD   0.5 mg at 06/06/20 2149   amLODipine (NORVASC) tablet 10 mg  10 mg Oral Daily Shelly Coss, MD   10 mg at 06/07/20 0955   ascorbic acid (VITAMIN C) tablet 500 mg  500 mg Oral q morning - 10a Adhikari, Amrit, MD   500 mg at 06/07/20 0955   azithromycin (ZITHROMAX) 500 mg in sodium chloride 0.9 % 250 mL IVPB  500 mg Intravenous Q24H Shelly Coss, MD  250 mL/hr at 06/06/20 1521 500 mg at 06/06/20 1521   buPROPion (WELLBUTRIN XL) 24 hr tablet 150 mg  150 mg Oral Daily Shelly Coss, MD   150 mg at 06/07/20 0955   cefTRIAXone (ROCEPHIN) 1 g in sodium chloride 0.9 % 100 mL IVPB  1 g Intravenous Q24H Adhikari, Amrit, MD 200 mL/hr at 06/06/20 1427 1 g at 06/06/20 1427   enoxaparin (LOVENOX) injection 40 mg  40 mg Subcutaneous Q24H Lama, Gagan S, MD   40 mg at 06/06/20 2149   feeding supplement (ENSURE ENLIVE) (ENSURE ENLIVE) liquid 237 mL  237 mL Oral BID BM Adhikari, Amrit, MD   237 mL at 06/06/20 1431    guaiFENesin (MUCINEX) 12 hr tablet 600 mg  600 mg Oral BID Shelly Coss, MD   600 mg at 06/07/20 0955   ipratropium-albuterol (DUONEB) 0.5-2.5 (3) MG/3ML nebulizer solution 3 mL  3 mL Nebulization Q6H PRN Adhikari, Amrit, MD       labetalol (NORMODYNE) injection 5 mg  5 mg Intravenous Q2H PRN Adhikari, Tamsen Meek, MD       levothyroxine (SYNTHROID) tablet 50 mcg  50 mcg Oral QAC breakfast Shelly Coss, MD   50 mcg at 06/07/20 9373   multivitamin with minerals tablet 1 tablet  1 tablet Oral q morning - 10a Shelly Coss, MD   1 tablet at 06/07/20 0955   oxyCODONE (Oxy IR/ROXICODONE) immediate release tablet 5 mg  5 mg Oral Q6H PRN Shelly Coss, MD       pantoprazole (PROTONIX) EC tablet 40 mg  40 mg Oral QAC breakfast Shelly Coss, MD   40 mg at 06/07/20 0955   polyethylene glycol (MIRALAX / GLYCOLAX) packet 17 g  17 g Oral Daily PRN Shelly Coss, MD       pravastatin (PRAVACHOL) tablet 40 mg  40 mg Oral QHS Shelly Coss, MD   40 mg at 06/06/20 2149   torsemide (DEMADEX) tablet 20 mg  20 mg Oral Daily Shelly Coss, MD   20 mg at 06/07/20 0955   umeclidinium bromide (INCRUSE ELLIPTA) 62.5 MCG/INH 1 puff  1 puff Inhalation Daily Shelly Coss, MD   1 puff at 06/07/20 0804   Facility-Administered Medications Ordered in Other Encounters  Medication Dose Route Frequency Provider Last Rate Last Admin   neomycin-polymyxin-dexameth (MAXITROL) 0.1 % ophth ointment    PRN Tonny Branch, MD   1 application at 42/87/68 1227     Discharge Medications: Please see discharge summary for a list of discharge medications.  Relevant Imaging Results:  Relevant Lab Results:   Additional Information SSN: [id number]. Dtr. Ms. Eulas Post reports patient is fully vaccinated for COVID and spouse has vaccination card.  Margan Elias, Clydene Pugh, LCSW

## 2020-06-07 NOTE — TOC Initial Note (Signed)
Transition of Care [facility]) - Initial/Assessment Note    Patient Details  Name: [name] MRN: [id number] Date of Birth: [birth year]  [facility]) CM/SW Contact:    Ihor Gully, LCSW Phone Number: 06/07/2020, 2:55 PM  Clinical Narrative:                 Patient from home with spouse. At baseline she ambulates with a walker when going to the bathroom. Daughter, Loletha Carrow and granddaughter assist with bathing. Has oxygen, shower chair, toilet raiser, shower chair. At baseline, patient "does nothing, she sits in a chair and don't move all day" per daughter Loletha Carrow.  Discussed PT recommendations for SNF. Vickie is agreeable to SNF. Choices discussed and referrals made.   Expected Discharge Plan: Skilled Nursing Facility Barriers to Discharge: Continued Medical Work up   Patient Goals and CMS Choice Patient states their goals for this hospitalization and ongoing recovery are:: rehab and home CMS Medicare.gov Compare Post Acute Care list provided to:: Other (Comment Required) (daughter, Scherrie Bateman) Choice offered to / list presented to : Adult Children  Expected Discharge Plan and Services Expected Discharge Plan: [place] Acute Care Choice: [place] Living arrangements for the past 2 months: Single Family Home                                      Prior Living Arrangements/Services Living arrangements for the past 2 months: Single Family Home Lives with:: Spouse Patient language and need for interpreter reviewed:: Yes Do you feel safe going back to the place where you live?: Yes      Need for Family Participation in Patient Care: Yes (Comment) Care giver support system in place?: Yes (comment) Current home services: DME Criminal Activity/Legal Involvement Pertinent to Current Situation/Hospitalization: No - Comment as needed  Activities of Daily Living Home Assistive Devices/Equipment: Bedside commode/3-in-1,  Shower chair with back, Walker (specify type) ADL Screening (condition at time of admission) Patient's cognitive ability adequate to safely complete daily activities?: Yes Is the patient deaf or have difficulty hearing?: No Does the patient have difficulty seeing, even when wearing glasses/contacts?: No Does the patient have difficulty concentrating, remembering, or making decisions?: Yes Patient able to express need for assistance with ADLs?: Yes Does the patient have difficulty dressing or bathing?: No (slow movement) Independently performs ADLs?: Yes (appropriate for developmental age) Does the patient have difficulty walking or climbing stairs?: Yes Weakness of Legs: Both Weakness of Arms/Hands: Both  Permission Sought/Granted Permission sought to share information with : Family Supports    Share Information with NAME: Scherrie Bateman     Permission granted to share info w Relationship: daughter     Emotional Assessment Appearance:: Appears stated age   Affect (typically observed): Appropriate Orientation: : Oriented to Self, Oriented to Place Alcohol / Substance Use: Not Applicable Psych Involvement: No (comment)  Admission diagnosis:  Hypoxia [R09.02] CAP (community acquired pneumonia) [J18.9] Community acquired pneumonia of left lung, unspecified part of lung [J18.9] Patient Active Problem List   Diagnosis Date Noted  . CAP (community acquired pneumonia) 06/04/2020  . Dementia with behavioral disturbance ([place]) 06/04/2020  . Acute blood loss anemia 08/04/2018  . Malnutrition of moderate degree 08/02/2018  . Closed left hip fracture, initial encounter ([place]) 08/01/2018  . Diastolic dysfunction 88/07/314  . Closed sacral fracture/S2-S3 02/27/2018  . Leg weakness, bilateral 02/26/2018  .  Fall at home, initial encounter 02/26/2018  . Oral thrush 02/26/2017  . Rectal bleeding 10/21/2016  . Abdominal pain 06/06/2015  . Dysphagia   . Chronic respiratory failure with  hypoxia ([place]) 02/02/2014  . Alopecia 02/02/2014  . Loss of weight 01/24/2014  . Unspecified constipation 01/24/2014  . Esophageal dysphagia 01/24/2014  . COPD Golds D, frequent exacerbations   . Hypertension   . Hyperlipidemia   . Hypothyroidism   . Depression    PCP:  [name], MD Pharmacy:   [facility] [place], [address] [place] [address] Phone: [phone] Fax: [phone]  [place], [place] [address] Phone: [phone] Fax: [place], [place] [address] [place] [address] Phone: [phone] Fax: [phone]  [facility] [place], [place] [address] Phone: [phone] Fax: [phone]     Social Determinants of Health ([place]) Interventions    Readmission Risk Interventions No flowsheet data found.

## 2020-06-07 NOTE — Care Management Important Message (Signed)
Important Message  Patient Details  Name: [name] MRN: [id number] Date of Birth: [birth year]   Medicare Important Message Given:  Yes     Tommy Medal 06/07/2020, 1:20 PM

## 2020-06-07 NOTE — Progress Notes (Signed)
Physical Therapy Treatment Patient Details Name: [name] MRN: [id number] DOB: Jul 06, 1941 Today's Date: 06/07/2020    History of Present Illness [name] is a 79 y.o. female with medical history significant of diastolic congestive heart failure, COPD on home oxygen at 3 L/min, hypertension, hyperlipidemia, history of tobacco abuse, confusion at baseline who was brought from home by her son after she desaturated.  Her son noticed that yesterday her saturation was in the range of 73% .  He gave her some breathing treatment and she came back in the range of 90s.  This morning when she was assisted to go to the bathroom by her son, her saturation dropped to 54-55% and she became very dyspneic so she was brought to the emergency department.  There was no report of fever or chills at home.  Patient has been fully vaccinated against Covid.  Patient has dementia at baseline though it has not been formally diagnosed and is confused.  She ambulates  with the help of walker but since yesterday she has not been able to move out of her bed.Patient seen and examined at bedside in the emergency department.  During my evaluation she was quite hypertensive but was maintaining her saturation in 90s at 4 L of oxygen per minute.  There is no report of chest pain, palpitation, abdomen, nausea, vomiting, dysuria, diarrhea, melena or hematochezia.    PT Comments    Patient transitions to seated EOB without physical assist and shows good sitting tolerance and sitting balance today. Patient transfers to standing with RW and assist but is unsteady with standing secondary to weakness and fatigue. Patient ambulates with assist and RW several, shuffled steps to to chair with unsteady gait. Patient given some verbal cueing for sequencing throughout session. Patient shown and able to perform seated exercises with good mechanics. Patient will benefit from continued physical therapy in hospital and recommended venue below to  increase strength, balance, endurance for safe ADLs and gait.    Follow Up Recommendations  SNF;Supervision/Assistance - 24 hour;Supervision for mobility/OOB     Equipment Recommendations       Recommendations for Other Services       Precautions / Restrictions Precautions Precautions: Fall Restrictions Weight Bearing Restrictions: No    Mobility  Bed Mobility Overal bed mobility: Modified Independent             General bed mobility comments: slightly slow transition to EOB with HOB elevated  Transfers Overall transfer level: Needs assistance Equipment used: Rolling walker (2 wheeled) Transfers: Sit to/from [facility] Sit to Stand: Min assist Stand pivot transfers: Min assist       General transfer comment: min A to transfer to standing with RW, unsteady upon standing  Ambulation/Gait Ambulation/Gait assistance: Mod assist Gait Distance (Feet): 3 Feet Assistive device: Rolling walker (2 wheeled) Gait Pattern/deviations: Shuffle Gait velocity: decreased   General Gait Details: slow, shuffled steps to chair at bedside, unsteady using RW   Stairs             Wheelchair Mobility    Modified Rankin (Stroke Patients Only)       Balance Overall balance assessment: Needs assistance Sitting-balance support: No upper extremity supported Sitting balance-Leahy Scale: Good Sitting balance - Comments: seated EOB   Standing balance support: Bilateral upper extremity supported Standing balance-Leahy Scale: Poor Standing balance comment: fair/poor with RW  Cognition Arousal/Alertness: Awake/alert Behavior During Therapy: WFL for tasks assessed/performed Overall Cognitive Status: Within Functional Limits for tasks assessed                                        Exercises General Exercises - Lower Extremity Ankle Circles/Pumps: AROM;15 reps;Seated Straight Leg Raises: AROM;Both;10  reps;Seated    General Comments        Pertinent Vitals/Pain Pain Assessment: No/denies pain    Home Living                      Prior Function            PT Goals (current goals can now be found in the care plan section) Acute Rehab PT Goals Patient Stated Goal: return home PT Goal Formulation: With patient Time For Goal Achievement: 06/20/20 Potential to Achieve Goals: Good Progress towards PT goals: Progressing toward goals    Frequency    Min 3X/week      PT Plan Current plan remains appropriate    Co-evaluation              AM-PAC PT "6 Clicks" Mobility   Outcome Measure  Help needed turning from your back to your side while in a flat bed without using bedrails?: A Little Help needed moving from lying on your back to sitting on the side of a flat bed without using bedrails?: A Little Help needed moving to and from a bed to a chair (including a wheelchair)?: A Little Help needed standing up from a chair using your arms (e.g., wheelchair or bedside chair)?: A Lot Help needed to walk in hospital room?: A Lot Help needed climbing 3-5 steps with a railing? : A Lot 6 Click Score: 15    End of Session Equipment Utilized During Treatment: Oxygen Activity Tolerance: Treatment limited secondary to agitation Patient left: in chair;with chair alarm set;with call bell/phone within reach Nurse Communication: Mobility status PT Visit Diagnosis: Muscle weakness (generalized) (M62.81)     Time: 4536-4680 PT Time Calculation (min) (ACUTE ONLY): 13 min  Charges:  $Therapeutic Activity: 8-22 mins                     3:32 PM, 06/07/20 Mearl Latin PT, DPT Physical Therapist at [facility]

## 2020-06-08 NOTE — TOC Progression Note (Signed)
Transition of Care [facility]) - Progression Note    Patient Details  Name: [name] MRN: [id number] Date of Birth: 1941-08-23  [facility] of [facility] CM/SW Contact  Natasha Bence, LCSW Phone Number: 06/08/2020, 1:30 PM  Clinical Narrative:    CSW contacted family to review bed offers. Family agreeable to [facility]. CSW contacted [facility] to inquire about bed availability. Debbie with [facility] reported that they will have a female bed available on Sunday 06/08/2020. TOC to follow.   Expected Discharge Plan: [place] Barriers to Discharge: Continued Medical Work up  Expected Discharge Plan and Services Expected Discharge Plan: [place] Choice: [place] arrangements for the past 2 months: Single Family Home                                       Social Determinants of Health (SDOH) Interventions    Readmission Risk Interventions No flowsheet data found.

## 2020-06-08 NOTE — Progress Notes (Addendum)
Admission [facility] Hospital[facility]  PROGRESS NOTE  [name] ION:[id number] DOB: 1941/03/04 DOA: 06/04/2020 PCP: [name], MD   Brief HPI:   79 year old female with medical history of diastolic heart failure, COPD on home O2 3 L/min, hypertension, hyperlipidemia, tobacco abuse was brought to the hospital for hypoxia.  As per patient's son patient's O2 sats was 73% at home.  Dropped to 55% when patient was assisted to go to bathroom.  Patient has dementia at baseline.  EMS was called and O2 sats was 70s.  She was hypotensive on presentation.  Chest x-ray in the ED showed diffuse infiltrate throughout left lung worse in the left base.  She was admitted for management of community-acquired pneumonia and started on antibiotics.   Subjective   Patient seen and examined, denies any complaints.  Currently requiring 3 L/min of oxygen via nasal cannula.   Assessment/Plan:     1. Acute on chronic hypoxemic respiratory failure-chest x-ray showed large left-sided consolidation.  Patient started on ceftriaxone and Zithromax.  Blood cultures x 2 are negative to date.  Urinary strep pneumo antigen and Legionella antigen are pending.  PT evaluation done, patient recommended to go to skilled nursing facility. 2. Community-acquired pneumonia-chest x-ray shows large left consolidation, started on antibiotics as above.  Patient is now coughing up phlegm.  Continue Mucinex 600 mg p.o. twice daily 3. Chronic diastolic CHF-currently euvolemic, continue torsemide 20 mg p.o. daily.  Monitor intake and output.  Check BMP in a.m. 4. Hypertension-blood pressure stable, continue amlodipine. 5. Hyperlipidemia-continue pravastatin 6. COPD patient is on 3 L/min of oxygen at home.  Continue DuoNeb nebulizers every 6 hours as needed 7. Hypothyroidism-continue Synthroid.     COVID-19 Labs  No results for input(s): DDIMER, FERRITIN, LDH, CRP in the last 72 hours.  Lab Results  Component Value Date   [place]  NEGATIVE 06/04/2020     Scheduled medications:   . amLODipine  10 mg Oral Daily  . vitamin C  500 mg Oral q morning - 10a  . buPROPion  150 mg Oral Daily  . enoxaparin (LOVENOX) injection  40 mg Subcutaneous Q24H  . feeding supplement (ENSURE ENLIVE)  237 mL Oral BID BM  . guaiFENesin  600 mg Oral BID  . levothyroxine  50 mcg Oral QAC breakfast  . multivitamin with minerals  1 tablet Oral q morning - 10a  . pantoprazole  40 mg Oral QAC breakfast  . pravastatin  40 mg Oral QHS  . torsemide  20 mg Oral Daily  . umeclidinium bromide  1 puff Inhalation Daily    SpO2: 99 % O2 Flow Rate (L/min): 3 L/min    CBC: Recent Labs  Lab 06/04/20 1249 06/05/20 0629  WBC 7.7 8.0  NEUTROABS 6.0  --   HGB 10.8* 9.6*  HCT 38.6 35.5*  MCV 103.2* 104.4*  PLT 283 244    Basic Metabolic Panel: Recent Labs  Lab 06/04/20 1249 06/05/20 0629  NA 146* 145  K 3.6 3.9  CL 92* 91*  CO2 42* 47*  GLUCOSE 96 92  BUN 18 16  CREATININE 0.48 0.47  CALCIUM 8.6* 8.7*     Liver Function Tests: Recent Labs  Lab 06/04/20 1249  AST 18  ALT 17  ALKPHOS 50  BILITOT 1.0  PROT 6.5  ALBUMIN 3.2*     Antibiotics: Anti-infectives (From admission, onward)   Start     Dose/Rate Route Frequency Ordered Stop   06/05/20 1500  azithromycin (ZITHROMAX) 500 mg in sodium  chloride 0.9 % 250 mL IVPB        500 mg 250 mL/hr over 60 Minutes Intravenous Every 24 hours 06/04/20 1531     06/05/20 1400  cefTRIAXone (ROCEPHIN) 1 g in sodium chloride 0.9 % 100 mL IVPB        1 g 200 mL/hr over 30 Minutes Intravenous Every 24 hours 06/04/20 1531     06/04/20 1430  cefTRIAXone (ROCEPHIN) 1 g in sodium chloride 0.9 % 100 mL IVPB        1 g 200 mL/hr over 30 Minutes Intravenous  Once 06/04/20 1415 06/04/20 1517   06/04/20 1430  azithromycin (ZITHROMAX) 500 mg in sodium chloride 0.9 % 250 mL IVPB        500 mg 250 mL/hr over 60 Minutes Intravenous  Once 06/04/20 1415 06/04/20 1705       DVT prophylaxis:  Lovenox  Code Status: Full code  Family Communication: No family at bedside    Status is: Inpatient  Dispo: The patient is from: Home              Anticipated d/c is to: Home versus skilled nursing facility              Anticipated d/c date is: 06/09/2020              Patient currently not medically stable for discharge  Barrier to discharge-receiving IV antibiotics for pneumonia ongoing management for acute on chronic hypoxemic respiratory failure.  Pending PT evaluation today  Pressure Injury 06/04/20 Buttocks Right Stage 1 -  Intact skin with non-blanchable redness of a localized area usually over a bony prominence. (Active)  06/04/20 2226  Location: Buttocks  Location Orientation: Right  Staging: Stage 1 -  Intact skin with non-blanchable redness of a localized area usually over a bony prominence.  Wound Description (Comments):   Present on Admission: Yes     Consultants:    Procedures:     Objective   Vitals:   06/07/20 1446 06/07/20 2012 06/08/20 0545 06/08/20 0726  BP: 118/66 129/65 (!) 114/52   Pulse: 91 92 82   Resp: 17 16 16    Temp: 97.7 F (36.5 C) 99.2 F (37.3 C) 98.2 F (36.8 C)   TempSrc: Oral Oral Oral   SpO2: 93% 98% 100% 99%  Weight:      Height:        Intake/Output Summary (Last 24 hours) at 06/08/2020 1452 Last data filed at 06/08/2020 1300 Gross per 24 hour  Intake 720 ml  Output 450 ml  Net 270 ml    09/30 1901 - 10/02 0700 In: 720 [P.O.:720] Out: 300 [Urine:300]  Filed Weights   06/04/20 1236  Weight: 49.9 kg    Physical Examination:   General-appears in no acute distress Heart-S1-S2, regular, no murmur auscultated Lungs-clear to auscultation bilaterally, no wheezing or crackles auscultated Abdomen-soft, nontender, no organomegaly Extremities-no edema in the lower extremities Neuro-alert, oriented x3, no focal deficit noted  Data Reviewed:   Recent Results (from the past 240 hour(s))  Resp Panel by RT PCR (RSV,  Flu A&B, Covid) - Nasopharyngeal Swab     Status: None   Collection Time: 06/04/20 12:52 PM   Specimen: Nasopharyngeal Swab  Result Value Ref Range Status   SARS Coronavirus 2 by RT PCR NEGATIVE NEGATIVE Final    Comment: (NOTE) SARS-CoV-2 target nucleic acids are NOT DETECTED.  The SARS-CoV-2 RNA is generally detectable in upper respiratoy specimens during the acute phase of  infection. The lowest concentration of SARS-CoV-2 viral copies this assay can detect is 131 copies/mL. A negative result does not preclude SARS-Cov-2 infection and should not be used as the sole basis for treatment or other patient management decisions. A negative result may occur with  improper specimen collection/handling, submission of specimen other than nasopharyngeal swab, presence of viral mutation(s) within the areas targeted by this assay, and inadequate number of viral copies (<131 copies/mL). A negative result must be combined with clinical observations, patient history, and epidemiological information. The expected result is Negative.  Fact Sheet for Patients:  PinkCheek.be  Fact Sheet for Healthcare Providers:  GravelBags.it  This test is no t yet approved or cleared by the [place] FDA and  has been authorized for detection and/or diagnosis of SARS-CoV-2 by FDA under an Emergency Use Authorization (EUA). This EUA will remain  in effect (meaning this test can be used) for the duration of the COVID-19 declaration under Section 564(b)(1) of the Act, 21 U.S.C. section 360bbb-3(b)(1), unless the authorization is terminated or revoked sooner.     Influenza A by PCR NEGATIVE NEGATIVE Final   Influenza B by PCR NEGATIVE NEGATIVE Final    Comment: (NOTE) The Xpert Xpress SARS-CoV-2/FLU/RSV assay is intended as an aid in  the diagnosis of influenza from Nasopharyngeal swab specimens and  should not be used as a sole basis for treatment.  Nasal washings and  aspirates are unacceptable for Xpert Xpress SARS-CoV-2/FLU/RSV  testing.  Fact Sheet for Patients: PinkCheek.be  Fact Sheet for Healthcare Providers: GravelBags.it  This test is not yet approved or cleared by the [place] FDA and  has been authorized for detection and/or diagnosis of SARS-CoV-2 by  FDA under an Emergency Use Authorization (EUA). This EUA will remain  in effect (meaning this test can be used) for the duration of the  Covid-19 declaration under Section 564(b)(1) of the Act, 21  U.S.C. section 360bbb-3(b)(1), unless the authorization is  terminated or revoked.    Respiratory Syncytial Virus by PCR NEGATIVE NEGATIVE Final    Comment: (NOTE) Fact Sheet for Patients: PinkCheek.be  Fact Sheet for Healthcare Providers: GravelBags.it  This test is not yet approved or cleared by the [place] FDA and  has been authorized for detection and/or diagnosis of SARS-CoV-2 by  FDA under an Emergency Use Authorization (EUA). This EUA will remain  in effect (meaning this test can be used) for the duration of the  COVID-19 declaration under Section 564(b)(1) of the Act, 21 U.S.C.  section 360bbb-3(b)(1), unless the authorization is terminated or  revoked. Performed at [facility], [address] [place], [place] [address]   Culture, blood (routine x 2)     Status: None (Preliminary result)   Collection Time: 06/04/20  4:26 PM   Specimen: Left Antecubital; Blood  Result Value Ref Range Status   Specimen Description LEFT ANTECUBITAL  Final   Special Requests   Final    BOTTLES DRAWN AEROBIC AND ANAEROBIC Blood Culture adequate volume   Culture   Final    NO GROWTH 4 DAYS Performed at [facility], [address], [place], [place] [address]    Report Status PENDING  Incomplete  Culture, blood (routine x 2)     Status: None (Preliminary  result)   Collection Time: 06/04/20  4:26 PM   Specimen: BLOOD LEFT ARM  Result Value Ref Range Status   Specimen Description BLOOD LEFT ARM  Final   Special Requests   Final  BOTTLES DRAWN AEROBIC AND ANAEROBIC Blood Culture adequate volume   Culture   Final    NO GROWTH 4 DAYS Performed at [facility], [address], [place], [place] [address]    Report Status PENDING  Incomplete    BNP (last 3 results) Recent Labs    06/04/20 1249  BNP 169.0*     [place]   [facility] If 7PM-7AM, please contact night-coverage at [email or web address], Office  [phone]   06/08/2020, 2:52 PM  LOS: 4 days

## 2020-06-09 DIAGNOSIS — Z803 Family history of malignant neoplasm of breast: Secondary | ICD-10-CM | POA: Diagnosis not present

## 2020-06-09 DIAGNOSIS — F0391 Unspecified dementia with behavioral disturbance: Secondary | ICD-10-CM | POA: Diagnosis not present

## 2020-06-09 DIAGNOSIS — Z79890 Hormone replacement therapy (postmenopausal): Secondary | ICD-10-CM | POA: Diagnosis not present

## 2020-06-09 DIAGNOSIS — J440 Chronic obstructive pulmonary disease with acute lower respiratory infection: Secondary | ICD-10-CM | POA: Diagnosis not present

## 2020-06-09 DIAGNOSIS — E785 Hyperlipidemia, unspecified: Secondary | ICD-10-CM | POA: Diagnosis present

## 2020-06-09 DIAGNOSIS — R2681 Unsteadiness on feet: Secondary | ICD-10-CM | POA: Diagnosis present

## 2020-06-09 DIAGNOSIS — Z7401 Bed confinement status: Secondary | ICD-10-CM | POA: Diagnosis not present

## 2020-06-09 DIAGNOSIS — L8990 Pressure ulcer of unspecified site, unspecified stage: Secondary | ICD-10-CM | POA: Insufficient documentation

## 2020-06-09 DIAGNOSIS — I10 Essential (primary) hypertension: Secondary | ICD-10-CM | POA: Diagnosis present

## 2020-06-09 DIAGNOSIS — J9621 Acute and chronic respiratory failure with hypoxia: Secondary | ICD-10-CM | POA: Diagnosis present

## 2020-06-09 DIAGNOSIS — I5032 Chronic diastolic (congestive) heart failure: Secondary | ICD-10-CM | POA: Diagnosis not present

## 2020-06-09 DIAGNOSIS — Z20822 Contact with and (suspected) exposure to covid-19: Secondary | ICD-10-CM | POA: Diagnosis not present

## 2020-06-09 DIAGNOSIS — Z8261 Family history of arthritis: Secondary | ICD-10-CM | POA: Diagnosis not present

## 2020-06-09 DIAGNOSIS — I5189 Other ill-defined heart diseases: Secondary | ICD-10-CM | POA: Diagnosis not present

## 2020-06-09 DIAGNOSIS — F419 Anxiety disorder, unspecified: Secondary | ICD-10-CM | POA: Diagnosis not present

## 2020-06-09 DIAGNOSIS — J449 Chronic obstructive pulmonary disease, unspecified: Secondary | ICD-10-CM | POA: Diagnosis present

## 2020-06-09 DIAGNOSIS — Z79899 Other long term (current) drug therapy: Secondary | ICD-10-CM | POA: Diagnosis not present

## 2020-06-09 DIAGNOSIS — I5030 Unspecified diastolic (congestive) heart failure: Secondary | ICD-10-CM | POA: Diagnosis present

## 2020-06-09 DIAGNOSIS — Z9842 Cataract extraction status, left eye: Secondary | ICD-10-CM | POA: Diagnosis not present

## 2020-06-09 DIAGNOSIS — I110 Hypertensive heart disease with heart failure: Secondary | ICD-10-CM | POA: Diagnosis not present

## 2020-06-09 DIAGNOSIS — R41841 Cognitive communication deficit: Secondary | ICD-10-CM | POA: Diagnosis present

## 2020-06-09 DIAGNOSIS — Z9841 Cataract extraction status, right eye: Secondary | ICD-10-CM | POA: Diagnosis not present

## 2020-06-09 DIAGNOSIS — Z8249 Family history of ischemic heart disease and other diseases of the circulatory system: Secondary | ICD-10-CM | POA: Diagnosis not present

## 2020-06-09 DIAGNOSIS — M6281 Muscle weakness (generalized): Secondary | ICD-10-CM | POA: Diagnosis present

## 2020-06-09 DIAGNOSIS — J189 Pneumonia, unspecified organism: Secondary | ICD-10-CM | POA: Diagnosis present

## 2020-06-09 DIAGNOSIS — E46 Unspecified protein-calorie malnutrition: Secondary | ICD-10-CM | POA: Diagnosis present

## 2020-06-09 DIAGNOSIS — Z23 Encounter for immunization: Secondary | ICD-10-CM | POA: Diagnosis not present

## 2020-06-09 DIAGNOSIS — L89311 Pressure ulcer of right buttock, stage 1: Secondary | ICD-10-CM | POA: Diagnosis not present

## 2020-06-09 DIAGNOSIS — Z91030 Bee allergy status: Secondary | ICD-10-CM | POA: Diagnosis not present

## 2020-06-09 DIAGNOSIS — Z90710 Acquired absence of both cervix and uterus: Secondary | ICD-10-CM | POA: Diagnosis not present

## 2020-06-09 DIAGNOSIS — E039 Hypothyroidism, unspecified: Secondary | ICD-10-CM | POA: Diagnosis present

## 2020-06-09 DIAGNOSIS — Z87891 Personal history of nicotine dependence: Secondary | ICD-10-CM | POA: Diagnosis not present

## 2020-06-09 DIAGNOSIS — Z743 Need for continuous supervision: Secondary | ICD-10-CM | POA: Diagnosis not present

## 2020-06-09 DIAGNOSIS — Z823 Family history of stroke: Secondary | ICD-10-CM | POA: Diagnosis not present

## 2020-06-09 DIAGNOSIS — E440 Moderate protein-calorie malnutrition: Secondary | ICD-10-CM | POA: Diagnosis not present

## 2020-06-09 DIAGNOSIS — Z961 Presence of intraocular lens: Secondary | ICD-10-CM | POA: Diagnosis not present

## 2020-06-09 LAB — CULTURE, BLOOD (ROUTINE X 2)
Culture: NO GROWTH
Culture: NO GROWTH
Special Requests: ADEQUATE
Special Requests: ADEQUATE

## 2020-06-09 LAB — SARS CORONAVIRUS 2 BY RT PCR (HOSPITAL ORDER, PERFORMED IN ~~LOC~~ HOSPITAL LAB): SARS Coronavirus 2: NEGATIVE

## 2020-06-09 MED ORDER — GUAIFENESIN ER 600 MG PO TB12: 600.0000 mg | ORAL_TABLET | Freq: Two times a day (BID) | ORAL | Status: DC

## 2020-06-09 MED ORDER — ALPRAZOLAM 0.5 MG PO TABS: 0.5000 mg | ORAL_TABLET | Freq: Three times a day (TID) | ORAL | 0 refills | Status: DC | PRN

## 2020-06-09 MED ORDER — OXYCODONE HCL 5 MG PO TABS
5.0000 mg | ORAL_TABLET | Freq: Four times a day (QID) | ORAL | 0 refills | Status: DC | PRN
Start: 2020-06-09 — End: 2021-10-03

## 2020-06-09 MED ORDER — AMLODIPINE BESYLATE 10 MG PO TABS: 10.0000 mg | ORAL_TABLET | Freq: Every day | ORAL | Status: DC

## 2020-06-09 MED ORDER — ALBUTEROL SULFATE (2.5 MG/3ML) 0.083% IN NEBU: 2.5000 mg | INHALATION_SOLUTION | Freq: Four times a day (QID) | RESPIRATORY_TRACT | Status: AC | PRN

## 2020-06-09 NOTE — Discharge Summary (Signed)
Physician Discharge Summary  [name] OZH:[id number] DOB: 1941/06/19 DOA: 06/04/2020  PCP: [name], MD  Admit date: 06/04/2020 Discharge date: 06/09/2020  Time spent: *50 minutes  Recommendations for Outpatient Follow-up:  1. Patient to be discharged to skilled nursing facility for rehab  Discharge Diagnoses:  Principal Problem:   CAP (community acquired pneumonia) Active Problems:   COPD Golds D, frequent exacerbations   Hypertension   Hyperlipidemia   Hypothyroidism   Diastolic dysfunction   Malnutrition of moderate degree   Dementia with behavioral disturbance (HCC)   Pressure injury of skin   Discharge Condition: Stable  Diet recommendation: Heart healthy diet  Filed Weights   06/04/20 1236  Weight: 49.9 kg    History of present illness:  79 year old female with medical history of diastolic heart failure, COPD on home O2 3 L/min, hypertension, hyperlipidemia, tobacco abuse was brought to the hospital for hypoxia.  As per patient's son patient's O2 sats was 73% at home.  Dropped to 55% when patient was assisted to go to bathroom.  Patient has dementia at baseline.  EMS was called and O2 sats was 70s.  She was hypotensive on presentation.  Chest x-ray in the ED showed diffuse infiltrate throughout left lung worse in the left base.  She was admitted for management of community-acquired pneumonia and started on antibiotics.    Hospital Course:   1. Acute on chronic hypoxemic respiratory failure-chest x-ray showed large left-sided consolidation.  Patient started on ceftriaxone and Zithromax.    Patient has completed 5 days of treatment in the hospital.  Will discontinue to biotics.  Blood cultures x 2 are negative to date.    PT evaluation done, patient recommended to go to skilled nursing facility. 2. Community-acquired pneumonia-chest x-ray shows large left consolidation, started on antibiotics as above.  Completed treatment.  Continue Mucinex 600 mg p.o. twice  daily 3. Chronic diastolic CHF-currently euvolemic, continue torsemide 20 mg p.o. daily.   4. Hypertension-blood pressure stable, continue amlodipine. 5. Hyperlipidemia-continue pravastatin 6. COPD patient is on 3 L/min of oxygen at home.  Continue DuoNeb nebulizers every 6 hours as needed 7. Hypothyroidism-continue Synthroid.   Procedures:  Consultations:    Discharge Exam: Vitals:   06/09/20 0538 06/09/20 0722  BP: 109/74   Pulse: 82   Resp: 16   Temp: 99 F (37.2 C)   SpO2: 98% 98%    General: Appears in no acute distress Cardiovascular: S1-S2, regular, no murmur auscultated Respiratory: Clear to auscultation bilaterally  Discharge Instructions   Discharge Instructions    Diet - low sodium heart healthy   Complete by: As directed    Increase activity slowly   Complete by: As directed    No wound care   Complete by: As directed      Allergies as of 06/09/2020      Reactions   Bee Venom Swelling   SWELLING REACTION UNSPECIFIED       Medication List    STOP taking these medications   diltiazem 120 MG 24 hr capsule Commonly known as: Cardizem CD   enoxaparin 30 MG/0.3ML injection Commonly known as: LOVENOX     TAKE these medications   acetaminophen 325 MG tablet Commonly known as: TYLENOL Take 2 tablets (650 mg total) by mouth every 6 (six) hours as needed for mild pain (or Fever >/= 101).   albuterol (2.5 MG/3ML) 0.083% nebulizer solution Commonly known as: PROVENTIL Take 3 mLs (2.5 mg total) by nebulization every 6 (six) hours as  needed for wheezing or shortness of breath. What changed:   when to take this  reasons to take this  Another medication with the same name was removed. Continue taking this medication, and follow the directions you see here.   ALPRAZolam 0.5 MG tablet Commonly known as: XANAX Take 1 tablet (0.5 mg total) by mouth 3 (three) times daily as needed for anxiety.   amLODipine 10 MG tablet Commonly known as:  NORVASC Take 1 tablet (10 mg total) by mouth daily. Start taking on: June 10, 2020   buPROPion 150 MG 24 hr tablet Commonly known as: WELLBUTRIN XL Take 150 mg by mouth daily.   feeding supplement (ENSURE ENLIVE) Liqd Take 237 mLs by mouth 2 (two) times daily between meals.   guaiFENesin 600 MG 12 hr tablet Commonly known as: MUCINEX Take 1 tablet (600 mg total) by mouth 2 (two) times daily.   levothyroxine 50 MCG tablet Commonly known as: SYNTHROID Take 50 mcg by mouth daily before breakfast.   multivitamin capsule Take 1 capsule by mouth every morning.   oxyCODONE 5 MG immediate release tablet Commonly known as: Oxy IR/ROXICODONE Take 1 tablet (5 mg total) by mouth every 6 (six) hours as needed for moderate pain.   pantoprazole 40 MG tablet Commonly known as: PROTONIX Take 1 tablet (40 mg total) by mouth daily before breakfast.   polyethylene glycol 17 g packet Commonly known as: MIRALAX / GLYCOLAX Take 17 g by mouth 2 (two) times daily. What changed:   when to take this  reasons to take this   pravastatin 40 MG tablet Commonly known as: PRAVACHOL Take 1 tablet (40 mg total) by mouth at bedtime. What changed: when to take this   tiotropium 18 MCG inhalation capsule Commonly known as: SPIRIVA Place 1 capsule (18 mcg total) into inhaler and inhale daily. What changed:   when to take this  reasons to take this   torsemide 20 MG tablet Commonly known as: DEMADEX Take 1 tablet (20 mg total) by mouth once. What changed: when to take this   VITAMIN C PO Take 1 tablet by mouth every morning.      Allergies  Allergen Reactions  . Bee Venom Swelling    SWELLING REACTION UNSPECIFIED     Contact information for after-discharge care    [facility] .   Service: Skilled Nursing Contact information: [address] [place] [phone]                   The results of  significant diagnostics from this hospitalization (including imaging, microbiology, ancillary and laboratory) are listed below for reference.    Significant Diagnostic Studies: DG Chest Portable 1 View  Result Date: 06/04/2020 CLINICAL DATA:  Shortness of breath EXAM: PORTABLE CHEST 1 VIEW COMPARISON:  08/01/2018 FINDINGS: Cardiac shadow is stable. Aortic calcifications are again seen. Patchy infiltrates are noted throughout the left lung but concentrated within the left base with associated effusion. Small right-sided effusion is noted as well. Diffuse emphysematous changes are seen. IMPRESSION: Diffuse infiltrate throughout the left lung worst in the left base with associated small effusions bilaterally. Electronically Signed   By: Inez Catalina M.D.   On: 06/04/2020 13:21    Microbiology: Recent Results (from the past 240 hour(s))  Resp Panel by RT PCR (RSV, Flu A&B, Covid) - Nasopharyngeal Swab     Status: None   Collection Time: 06/04/20 12:52 PM   Specimen:  Nasopharyngeal Swab  Result Value Ref Range Status   SARS Coronavirus 2 by RT PCR NEGATIVE NEGATIVE Final    Comment: (NOTE) SARS-CoV-2 target nucleic acids are NOT DETECTED.  The SARS-CoV-2 RNA is generally detectable in upper respiratoy specimens during the acute phase of infection. The lowest concentration of SARS-CoV-2 viral copies this assay can detect is 131 copies/mL. A negative result does not preclude SARS-Cov-2 infection and should not be used as the sole basis for treatment or other patient management decisions. A negative result may occur with  improper specimen collection/handling, submission of specimen other than nasopharyngeal swab, presence of viral mutation(s) within the areas targeted by this assay, and inadequate number of viral copies (<131 copies/mL). A negative result must be combined with clinical observations, patient history, and epidemiological information. The expected result is Negative.  Fact Sheet  for Patients:  PinkCheek.be  Fact Sheet for Healthcare Providers:  GravelBags.it  This test is no t yet approved or cleared by the [place] FDA and  has been authorized for detection and/or diagnosis of SARS-CoV-2 by FDA under an Emergency Use Authorization (EUA). This EUA will remain  in effect (meaning this test can be used) for the duration of the COVID-19 declaration under Section 564(b)(1) of the Act, 21 U.S.C. section 360bbb-3(b)(1), unless the authorization is terminated or revoked sooner.     Influenza A by PCR NEGATIVE NEGATIVE Final   Influenza B by PCR NEGATIVE NEGATIVE Final    Comment: (NOTE) The Xpert Xpress SARS-CoV-2/FLU/RSV assay is intended as an aid in  the diagnosis of influenza from Nasopharyngeal swab specimens and  should not be used as a sole basis for treatment. Nasal washings and  aspirates are unacceptable for Xpert Xpress SARS-CoV-2/FLU/RSV  testing.  Fact Sheet for Patients: PinkCheek.be  Fact Sheet for Healthcare Providers: GravelBags.it  This test is not yet approved or cleared by the [place] FDA and  has been authorized for detection and/or diagnosis of SARS-CoV-2 by  FDA under an Emergency Use Authorization (EUA). This EUA will remain  in effect (meaning this test can be used) for the duration of the  Covid-19 declaration under Section 564(b)(1) of the Act, 21  U.S.C. section 360bbb-3(b)(1), unless the authorization is  terminated or revoked.    Respiratory Syncytial Virus by PCR NEGATIVE NEGATIVE Final    Comment: (NOTE) Fact Sheet for Patients: PinkCheek.be  Fact Sheet for Healthcare Providers: GravelBags.it  This test is not yet approved or cleared by the [place] FDA and  has been authorized for detection and/or diagnosis of SARS-CoV-2 by  FDA under  an Emergency Use Authorization (EUA). This EUA will remain  in effect (meaning this test can be used) for the duration of the  COVID-19 declaration under Section 564(b)(1) of the Act, 21 U.S.C.  section 360bbb-3(b)(1), unless the authorization is terminated or  revoked. Performed at [facility], [address] [place], [place] [address]   Culture, blood (routine x 2)     Status: None (Preliminary result)   Collection Time: 06/04/20  4:26 PM   Specimen: Left Antecubital; Blood  Result Value Ref Range Status   Specimen Description LEFT ANTECUBITAL  Final   Special Requests   Final    BOTTLES DRAWN AEROBIC AND ANAEROBIC Blood Culture adequate volume   Culture   Final    NO GROWTH 4 DAYS Performed at [facility], [address] [place], [place] [address]    Report Status PENDING  Incomplete  Culture, blood (routine x 2)  Status: None (Preliminary result)   Collection Time: 06/04/20  4:26 PM   Specimen: BLOOD LEFT ARM  Result Value Ref Range Status   Specimen Description BLOOD LEFT ARM  Final   Special Requests   Final    BOTTLES DRAWN AEROBIC AND ANAEROBIC Blood Culture adequate volume   Culture   Final    NO GROWTH 4 DAYS Performed at [facility], [address] [place], [place] [address]    Report Status PENDING  Incomplete     Labs: Basic Metabolic Panel: Recent Labs  Lab 06/04/20 1249 06/05/20 0629  NA 146* 145  K 3.6 3.9  CL 92* 91*  CO2 42* 47*  GLUCOSE 96 92  BUN 18 16  CREATININE 0.48 0.47  CALCIUM 8.6* 8.7*   Liver Function Tests: Recent Labs  Lab 06/04/20 1249  AST 18  ALT 17  ALKPHOS 50  BILITOT 1.0  PROT 6.5  ALBUMIN 3.2*   No results for input(s): LIPASE, AMYLASE in the last 168 hours. No results for input(s): AMMONIA in the last 168 hours. CBC: Recent Labs  Lab 06/04/20 1249 06/05/20 0629  WBC 7.7 8.0  NEUTROABS 6.0  --   HGB 10.8* 9.6*  HCT 38.6 35.5*  MCV 103.2* 104.4*  PLT 283 281    BNP (last 3 results) Recent Labs     06/04/20 1249  BNP 169.0*        Signed:  Oswald Hillock MD.  [facility] 06/09/2020, 10:17 AM

## 2020-06-09 NOTE — TOC Transition Note (Signed)
Transition of Care [facility]) - CM/SW Discharge Note   Patient Details  Name: [name] MRN: [id number] Date of Birth: [birth year]  [facility] CM/SW Contact:  Natasha Bence, LCSW Phone Number: 06/09/2020, 12:50 PM   Clinical Narrative:    CSW verified bed availability with [facility]. [facility] agreeable to take patient. CSW completed med necessity form and called EMS. Nurse to call report. TOC signing off.    Final next level of care: Skilled Nursing Facility Barriers to Discharge: Barriers Resolved   Patient Goals and CMS Choice Patient states their goals for this hospitalization and ongoing recovery are:: SNF rehab CMS Medicare.gov Compare Post Acute Care list provided to:: Other (Comment Required) (daughter, Scherrie Bateman) Choice offered to / list presented to : Patient  Discharge Placement                Patient to be transferred to facility by: [facility] Name of family member notified: Sammuel Bailiff Patient and family notified of of transfer: 06/09/20  Discharge Plan and Services     Post Acute Care Choice: [place]                               Social Determinants of Health (SDOH) Interventions     Readmission Risk Interventions No flowsheet data found.

## 2020-06-09 NOTE — Progress Notes (Signed)
Nsg Discharge Note  Admit Date:  06/04/2020 Discharge date: 06/09/2020   [name] to be D/C'd Skilled nursing facility per MD order.  AVS completed.  Copy for chart, and copy for patient signed, and dated. Patient/caregiver able to verbalize understanding.  Discharge Medication: Allergies as of 06/09/2020      Reactions   Bee Venom Swelling   SWELLING REACTION UNSPECIFIED       Medication List    STOP taking these medications   diltiazem 120 MG 24 hr capsule Commonly known as: Cardizem CD   enoxaparin 30 MG/0.3ML injection Commonly known as: LOVENOX     TAKE these medications   acetaminophen 325 MG tablet Commonly known as: TYLENOL Take 2 tablets (650 mg total) by mouth every 6 (six) hours as needed for mild pain (or Fever >/= 101).   albuterol (2.5 MG/3ML) 0.083% nebulizer solution Commonly known as: PROVENTIL Take 3 mLs (2.5 mg total) by nebulization every 6 (six) hours as needed for wheezing or shortness of breath. What changed:   when to take this  reasons to take this  Another medication with the same name was removed. Continue taking this medication, and follow the directions you see here.   ALPRAZolam 0.5 MG tablet Commonly known as: XANAX Take 1 tablet (0.5 mg total) by mouth 3 (three) times daily as needed for anxiety.   amLODipine 10 MG tablet Commonly known as: NORVASC Take 1 tablet (10 mg total) by mouth daily. Start taking on: June 10, 2020   buPROPion 150 MG 24 hr tablet Commonly known as: WELLBUTRIN XL Take 150 mg by mouth daily.   feeding supplement (ENSURE ENLIVE) Liqd Take 237 mLs by mouth 2 (two) times daily between meals.   guaiFENesin 600 MG 12 hr tablet Commonly known as: MUCINEX Take 1 tablet (600 mg total) by mouth 2 (two) times daily.   levothyroxine 50 MCG tablet Commonly known as: SYNTHROID Take 50 mcg by mouth daily before breakfast.   multivitamin capsule Take 1 capsule by mouth every morning.   oxyCODONE 5 MG  immediate release tablet Commonly known as: Oxy IR/ROXICODONE Take 1 tablet (5 mg total) by mouth every 6 (six) hours as needed for moderate pain.   pantoprazole 40 MG tablet Commonly known as: PROTONIX Take 1 tablet (40 mg total) by mouth daily before breakfast.   polyethylene glycol 17 g packet Commonly known as: MIRALAX / GLYCOLAX Take 17 g by mouth 2 (two) times daily. What changed:   when to take this  reasons to take this   pravastatin 40 MG tablet Commonly known as: PRAVACHOL Take 1 tablet (40 mg total) by mouth at bedtime. What changed: when to take this   tiotropium 18 MCG inhalation capsule Commonly known as: SPIRIVA Place 1 capsule (18 mcg total) into inhaler and inhale daily. What changed:   when to take this  reasons to take this   torsemide 20 MG tablet Commonly known as: DEMADEX Take 1 tablet (20 mg total) by mouth once. What changed: when to take this   VITAMIN C PO Take 1 tablet by mouth every morning.       Discharge Assessment: Vitals:   06/09/20 0722 06/09/20 1435  BP:  106/61  Pulse:  (!) 56  Resp:  20  Temp:  97.6 F (36.4 C)  SpO2: 98% 97%   Skin clean, dry and intact without evidence of skin break down, no evidence of skin tears noted. IV catheter discontinued intact. Site without signs and  symptoms of complications - no redness or edema noted at insertion site, patient denies c/o pain - only slight tenderness at site.  Dressing with slight pressure applied.  D/c Instructions-Education: Discharge instructions given to [facility] with verbalized understanding. D/c education completed with patient/family including follow up instructions, medication list, d/c activities limitations if indicated, with other d/c instructions as indicated by MD - patient able to verbalize understanding, all questions fully answered. Patient to be discharged from unit by EMS to [facility], Kayren Eaves, RN 06/09/2020 3:59 PM

## 2020-06-10 DIAGNOSIS — I10 Essential (primary) hypertension: Secondary | ICD-10-CM | POA: Diagnosis not present

## 2020-06-10 DIAGNOSIS — J189 Pneumonia, unspecified organism: Secondary | ICD-10-CM | POA: Diagnosis not present

## 2020-06-10 DIAGNOSIS — E039 Hypothyroidism, unspecified: Secondary | ICD-10-CM | POA: Diagnosis not present

## 2020-06-10 DIAGNOSIS — J9621 Acute and chronic respiratory failure with hypoxia: Secondary | ICD-10-CM | POA: Diagnosis not present

## 2020-06-10 DIAGNOSIS — I5030 Unspecified diastolic (congestive) heart failure: Secondary | ICD-10-CM | POA: Diagnosis not present

## 2020-06-10 DIAGNOSIS — E785 Hyperlipidemia, unspecified: Secondary | ICD-10-CM | POA: Diagnosis not present

## 2020-06-10 DIAGNOSIS — J449 Chronic obstructive pulmonary disease, unspecified: Secondary | ICD-10-CM | POA: Diagnosis not present

## 2020-06-11 DIAGNOSIS — J189 Pneumonia, unspecified organism: Secondary | ICD-10-CM | POA: Diagnosis not present

## 2020-06-24 DIAGNOSIS — Z79899 Other long term (current) drug therapy: Secondary | ICD-10-CM | POA: Diagnosis not present

## 2020-06-24 DIAGNOSIS — J449 Chronic obstructive pulmonary disease, unspecified: Secondary | ICD-10-CM | POA: Diagnosis not present

## 2020-06-24 DIAGNOSIS — I10 Essential (primary) hypertension: Secondary | ICD-10-CM | POA: Diagnosis not present

## 2020-06-24 DIAGNOSIS — E039 Hypothyroidism, unspecified: Secondary | ICD-10-CM | POA: Diagnosis not present

## 2020-06-24 DIAGNOSIS — J9621 Acute and chronic respiratory failure with hypoxia: Secondary | ICD-10-CM | POA: Diagnosis not present

## 2020-06-24 DIAGNOSIS — E785 Hyperlipidemia, unspecified: Secondary | ICD-10-CM | POA: Diagnosis not present

## 2020-06-24 DIAGNOSIS — I5030 Unspecified diastolic (congestive) heart failure: Secondary | ICD-10-CM | POA: Diagnosis not present

## 2020-06-24 DIAGNOSIS — F419 Anxiety disorder, unspecified: Secondary | ICD-10-CM | POA: Diagnosis not present

## 2020-07-09 DIAGNOSIS — J449 Chronic obstructive pulmonary disease, unspecified: Secondary | ICD-10-CM | POA: Diagnosis not present

## 2020-07-09 DIAGNOSIS — J189 Pneumonia, unspecified organism: Secondary | ICD-10-CM | POA: Diagnosis not present

## 2020-07-09 DIAGNOSIS — I10 Essential (primary) hypertension: Secondary | ICD-10-CM | POA: Diagnosis not present

## 2020-07-11 DIAGNOSIS — J449 Chronic obstructive pulmonary disease, unspecified: Secondary | ICD-10-CM | POA: Diagnosis not present

## 2020-07-11 DIAGNOSIS — E039 Hypothyroidism, unspecified: Secondary | ICD-10-CM | POA: Diagnosis not present

## 2020-07-11 DIAGNOSIS — J9621 Acute and chronic respiratory failure with hypoxia: Secondary | ICD-10-CM | POA: Diagnosis not present

## 2020-07-11 DIAGNOSIS — I5030 Unspecified diastolic (congestive) heart failure: Secondary | ICD-10-CM | POA: Diagnosis not present

## 2020-07-11 DIAGNOSIS — F419 Anxiety disorder, unspecified: Secondary | ICD-10-CM | POA: Diagnosis not present

## 2020-07-11 DIAGNOSIS — I10 Essential (primary) hypertension: Secondary | ICD-10-CM | POA: Diagnosis not present

## 2020-07-11 DIAGNOSIS — E785 Hyperlipidemia, unspecified: Secondary | ICD-10-CM | POA: Diagnosis not present

## 2020-07-14 DIAGNOSIS — E785 Hyperlipidemia, unspecified: Secondary | ICD-10-CM | POA: Diagnosis not present

## 2020-07-14 DIAGNOSIS — I5032 Chronic diastolic (congestive) heart failure: Secondary | ICD-10-CM | POA: Diagnosis not present

## 2020-07-14 DIAGNOSIS — F17200 Nicotine dependence, unspecified, uncomplicated: Secondary | ICD-10-CM | POA: Diagnosis not present

## 2020-07-14 DIAGNOSIS — J440 Chronic obstructive pulmonary disease with acute lower respiratory infection: Secondary | ICD-10-CM | POA: Diagnosis not present

## 2020-07-14 DIAGNOSIS — F0391 Unspecified dementia with behavioral disturbance: Secondary | ICD-10-CM | POA: Diagnosis not present

## 2020-07-14 DIAGNOSIS — E440 Moderate protein-calorie malnutrition: Secondary | ICD-10-CM | POA: Diagnosis not present

## 2020-07-14 DIAGNOSIS — Z9981 Dependence on supplemental oxygen: Secondary | ICD-10-CM | POA: Diagnosis not present

## 2020-07-14 DIAGNOSIS — I5030 Unspecified diastolic (congestive) heart failure: Secondary | ICD-10-CM | POA: Diagnosis not present

## 2020-07-14 DIAGNOSIS — J9621 Acute and chronic respiratory failure with hypoxia: Secondary | ICD-10-CM | POA: Diagnosis not present

## 2020-07-14 DIAGNOSIS — J189 Pneumonia, unspecified organism: Secondary | ICD-10-CM | POA: Diagnosis not present

## 2020-07-14 DIAGNOSIS — R41841 Cognitive communication deficit: Secondary | ICD-10-CM | POA: Diagnosis not present

## 2020-07-14 DIAGNOSIS — I110 Hypertensive heart disease with heart failure: Secondary | ICD-10-CM | POA: Diagnosis not present

## 2020-07-18 DIAGNOSIS — R41841 Cognitive communication deficit: Secondary | ICD-10-CM | POA: Diagnosis not present

## 2020-07-18 DIAGNOSIS — J9621 Acute and chronic respiratory failure with hypoxia: Secondary | ICD-10-CM | POA: Diagnosis not present

## 2020-07-18 DIAGNOSIS — J449 Chronic obstructive pulmonary disease, unspecified: Secondary | ICD-10-CM | POA: Diagnosis not present

## 2020-07-18 DIAGNOSIS — J189 Pneumonia, unspecified organism: Secondary | ICD-10-CM | POA: Diagnosis not present

## 2020-07-18 DIAGNOSIS — I110 Hypertensive heart disease with heart failure: Secondary | ICD-10-CM | POA: Diagnosis not present

## 2020-07-18 DIAGNOSIS — F0391 Unspecified dementia with behavioral disturbance: Secondary | ICD-10-CM | POA: Diagnosis not present

## 2020-07-18 DIAGNOSIS — I5030 Unspecified diastolic (congestive) heart failure: Secondary | ICD-10-CM | POA: Diagnosis not present

## 2020-07-18 DIAGNOSIS — E440 Moderate protein-calorie malnutrition: Secondary | ICD-10-CM | POA: Diagnosis not present

## 2020-07-18 DIAGNOSIS — J440 Chronic obstructive pulmonary disease with acute lower respiratory infection: Secondary | ICD-10-CM | POA: Diagnosis not present

## 2020-07-18 DIAGNOSIS — F17200 Nicotine dependence, unspecified, uncomplicated: Secondary | ICD-10-CM | POA: Diagnosis not present

## 2020-07-21 DIAGNOSIS — F0391 Unspecified dementia with behavioral disturbance: Secondary | ICD-10-CM | POA: Diagnosis not present

## 2020-07-21 DIAGNOSIS — J189 Pneumonia, unspecified organism: Secondary | ICD-10-CM | POA: Diagnosis not present

## 2020-07-21 DIAGNOSIS — I5030 Unspecified diastolic (congestive) heart failure: Secondary | ICD-10-CM | POA: Diagnosis not present

## 2020-07-21 DIAGNOSIS — I110 Hypertensive heart disease with heart failure: Secondary | ICD-10-CM | POA: Diagnosis not present

## 2020-07-21 DIAGNOSIS — J440 Chronic obstructive pulmonary disease with acute lower respiratory infection: Secondary | ICD-10-CM | POA: Diagnosis not present

## 2020-07-21 DIAGNOSIS — J9621 Acute and chronic respiratory failure with hypoxia: Secondary | ICD-10-CM | POA: Diagnosis not present

## 2020-07-23 DIAGNOSIS — J440 Chronic obstructive pulmonary disease with acute lower respiratory infection: Secondary | ICD-10-CM | POA: Diagnosis not present

## 2020-07-23 DIAGNOSIS — I5030 Unspecified diastolic (congestive) heart failure: Secondary | ICD-10-CM | POA: Diagnosis not present

## 2020-07-23 DIAGNOSIS — F0391 Unspecified dementia with behavioral disturbance: Secondary | ICD-10-CM | POA: Diagnosis not present

## 2020-07-23 DIAGNOSIS — I110 Hypertensive heart disease with heart failure: Secondary | ICD-10-CM | POA: Diagnosis not present

## 2020-07-23 DIAGNOSIS — J9621 Acute and chronic respiratory failure with hypoxia: Secondary | ICD-10-CM | POA: Diagnosis not present

## 2020-07-23 DIAGNOSIS — J189 Pneumonia, unspecified organism: Secondary | ICD-10-CM | POA: Diagnosis not present

## 2020-07-29 DIAGNOSIS — J189 Pneumonia, unspecified organism: Secondary | ICD-10-CM | POA: Diagnosis not present

## 2020-07-29 DIAGNOSIS — I110 Hypertensive heart disease with heart failure: Secondary | ICD-10-CM | POA: Diagnosis not present

## 2020-07-29 DIAGNOSIS — F0391 Unspecified dementia with behavioral disturbance: Secondary | ICD-10-CM | POA: Diagnosis not present

## 2020-07-29 DIAGNOSIS — J9621 Acute and chronic respiratory failure with hypoxia: Secondary | ICD-10-CM | POA: Diagnosis not present

## 2020-07-29 DIAGNOSIS — J440 Chronic obstructive pulmonary disease with acute lower respiratory infection: Secondary | ICD-10-CM | POA: Diagnosis not present

## 2020-07-29 DIAGNOSIS — I5030 Unspecified diastolic (congestive) heart failure: Secondary | ICD-10-CM | POA: Diagnosis not present

## 2020-07-30 DIAGNOSIS — I110 Hypertensive heart disease with heart failure: Secondary | ICD-10-CM | POA: Diagnosis not present

## 2020-07-30 DIAGNOSIS — J440 Chronic obstructive pulmonary disease with acute lower respiratory infection: Secondary | ICD-10-CM | POA: Diagnosis not present

## 2020-07-30 DIAGNOSIS — I5030 Unspecified diastolic (congestive) heart failure: Secondary | ICD-10-CM | POA: Diagnosis not present

## 2020-07-30 DIAGNOSIS — J9621 Acute and chronic respiratory failure with hypoxia: Secondary | ICD-10-CM | POA: Diagnosis not present

## 2020-07-30 DIAGNOSIS — F0391 Unspecified dementia with behavioral disturbance: Secondary | ICD-10-CM | POA: Diagnosis not present

## 2020-07-30 DIAGNOSIS — J189 Pneumonia, unspecified organism: Secondary | ICD-10-CM | POA: Diagnosis not present

## 2020-08-07 DIAGNOSIS — J189 Pneumonia, unspecified organism: Secondary | ICD-10-CM | POA: Diagnosis not present

## 2020-08-07 DIAGNOSIS — I5030 Unspecified diastolic (congestive) heart failure: Secondary | ICD-10-CM | POA: Diagnosis not present

## 2020-08-07 DIAGNOSIS — J9621 Acute and chronic respiratory failure with hypoxia: Secondary | ICD-10-CM | POA: Diagnosis not present

## 2020-08-07 DIAGNOSIS — I110 Hypertensive heart disease with heart failure: Secondary | ICD-10-CM | POA: Diagnosis not present

## 2020-08-07 DIAGNOSIS — J440 Chronic obstructive pulmonary disease with acute lower respiratory infection: Secondary | ICD-10-CM | POA: Diagnosis not present

## 2020-08-07 DIAGNOSIS — F0391 Unspecified dementia with behavioral disturbance: Secondary | ICD-10-CM | POA: Diagnosis not present

## 2020-08-13 DIAGNOSIS — F0391 Unspecified dementia with behavioral disturbance: Secondary | ICD-10-CM | POA: Diagnosis not present

## 2020-08-13 DIAGNOSIS — E440 Moderate protein-calorie malnutrition: Secondary | ICD-10-CM | POA: Diagnosis not present

## 2020-08-13 DIAGNOSIS — J9621 Acute and chronic respiratory failure with hypoxia: Secondary | ICD-10-CM | POA: Diagnosis not present

## 2020-08-13 DIAGNOSIS — I110 Hypertensive heart disease with heart failure: Secondary | ICD-10-CM | POA: Diagnosis not present

## 2020-08-13 DIAGNOSIS — Z9981 Dependence on supplemental oxygen: Secondary | ICD-10-CM | POA: Diagnosis not present

## 2020-08-13 DIAGNOSIS — E785 Hyperlipidemia, unspecified: Secondary | ICD-10-CM | POA: Diagnosis not present

## 2020-08-13 DIAGNOSIS — J440 Chronic obstructive pulmonary disease with acute lower respiratory infection: Secondary | ICD-10-CM | POA: Diagnosis not present

## 2020-08-13 DIAGNOSIS — R41841 Cognitive communication deficit: Secondary | ICD-10-CM | POA: Diagnosis not present

## 2020-08-13 DIAGNOSIS — F17200 Nicotine dependence, unspecified, uncomplicated: Secondary | ICD-10-CM | POA: Diagnosis not present

## 2020-08-13 DIAGNOSIS — I5030 Unspecified diastolic (congestive) heart failure: Secondary | ICD-10-CM | POA: Diagnosis not present

## 2020-08-13 DIAGNOSIS — J189 Pneumonia, unspecified organism: Secondary | ICD-10-CM | POA: Diagnosis not present

## 2020-08-14 DIAGNOSIS — I5030 Unspecified diastolic (congestive) heart failure: Secondary | ICD-10-CM | POA: Diagnosis not present

## 2020-08-14 DIAGNOSIS — J189 Pneumonia, unspecified organism: Secondary | ICD-10-CM | POA: Diagnosis not present

## 2020-08-14 DIAGNOSIS — I110 Hypertensive heart disease with heart failure: Secondary | ICD-10-CM | POA: Diagnosis not present

## 2020-08-14 DIAGNOSIS — J440 Chronic obstructive pulmonary disease with acute lower respiratory infection: Secondary | ICD-10-CM | POA: Diagnosis not present

## 2020-08-14 DIAGNOSIS — J9621 Acute and chronic respiratory failure with hypoxia: Secondary | ICD-10-CM | POA: Diagnosis not present

## 2020-08-14 DIAGNOSIS — F0391 Unspecified dementia with behavioral disturbance: Secondary | ICD-10-CM | POA: Diagnosis not present

## 2020-08-22 DIAGNOSIS — J189 Pneumonia, unspecified organism: Secondary | ICD-10-CM | POA: Diagnosis not present

## 2020-08-22 DIAGNOSIS — J9621 Acute and chronic respiratory failure with hypoxia: Secondary | ICD-10-CM | POA: Diagnosis not present

## 2020-08-22 DIAGNOSIS — F0391 Unspecified dementia with behavioral disturbance: Secondary | ICD-10-CM | POA: Diagnosis not present

## 2020-08-22 DIAGNOSIS — J440 Chronic obstructive pulmonary disease with acute lower respiratory infection: Secondary | ICD-10-CM | POA: Diagnosis not present

## 2020-08-22 DIAGNOSIS — I110 Hypertensive heart disease with heart failure: Secondary | ICD-10-CM | POA: Diagnosis not present

## 2020-08-22 DIAGNOSIS — I5030 Unspecified diastolic (congestive) heart failure: Secondary | ICD-10-CM | POA: Diagnosis not present

## 2020-09-06 DIAGNOSIS — J449 Chronic obstructive pulmonary disease, unspecified: Secondary | ICD-10-CM | POA: Diagnosis not present

## 2020-09-06 DIAGNOSIS — E782 Mixed hyperlipidemia: Secondary | ICD-10-CM | POA: Diagnosis not present

## 2020-09-06 DIAGNOSIS — E039 Hypothyroidism, unspecified: Secondary | ICD-10-CM | POA: Diagnosis not present

## 2020-09-08 DIAGNOSIS — I5030 Unspecified diastolic (congestive) heart failure: Secondary | ICD-10-CM | POA: Diagnosis not present

## 2020-09-08 DIAGNOSIS — J440 Chronic obstructive pulmonary disease with acute lower respiratory infection: Secondary | ICD-10-CM | POA: Diagnosis not present

## 2020-09-08 DIAGNOSIS — J9621 Acute and chronic respiratory failure with hypoxia: Secondary | ICD-10-CM | POA: Diagnosis not present

## 2020-09-08 DIAGNOSIS — F0391 Unspecified dementia with behavioral disturbance: Secondary | ICD-10-CM | POA: Diagnosis not present

## 2020-09-08 DIAGNOSIS — J189 Pneumonia, unspecified organism: Secondary | ICD-10-CM | POA: Diagnosis not present

## 2020-09-08 DIAGNOSIS — I110 Hypertensive heart disease with heart failure: Secondary | ICD-10-CM | POA: Diagnosis not present

## 2020-10-05 DIAGNOSIS — J449 Chronic obstructive pulmonary disease, unspecified: Secondary | ICD-10-CM | POA: Diagnosis not present

## 2020-10-05 DIAGNOSIS — E039 Hypothyroidism, unspecified: Secondary | ICD-10-CM | POA: Diagnosis not present

## 2020-10-05 DIAGNOSIS — E782 Mixed hyperlipidemia: Secondary | ICD-10-CM | POA: Diagnosis not present

## 2020-11-04 DIAGNOSIS — E039 Hypothyroidism, unspecified: Secondary | ICD-10-CM | POA: Diagnosis not present

## 2020-11-04 DIAGNOSIS — E782 Mixed hyperlipidemia: Secondary | ICD-10-CM | POA: Diagnosis not present

## 2020-11-04 DIAGNOSIS — J449 Chronic obstructive pulmonary disease, unspecified: Secondary | ICD-10-CM | POA: Diagnosis not present

## 2020-12-02 DIAGNOSIS — F32A Depression, unspecified: Secondary | ICD-10-CM | POA: Diagnosis not present

## 2020-12-02 DIAGNOSIS — G4700 Insomnia, unspecified: Secondary | ICD-10-CM | POA: Diagnosis not present

## 2020-12-02 DIAGNOSIS — E782 Mixed hyperlipidemia: Secondary | ICD-10-CM | POA: Diagnosis not present

## 2020-12-02 DIAGNOSIS — J449 Chronic obstructive pulmonary disease, unspecified: Secondary | ICD-10-CM | POA: Diagnosis not present

## 2020-12-02 DIAGNOSIS — E039 Hypothyroidism, unspecified: Secondary | ICD-10-CM | POA: Diagnosis not present

## 2020-12-02 DIAGNOSIS — F419 Anxiety disorder, unspecified: Secondary | ICD-10-CM | POA: Diagnosis not present

## 2020-12-02 DIAGNOSIS — R609 Edema, unspecified: Secondary | ICD-10-CM | POA: Diagnosis not present

## 2020-12-02 DIAGNOSIS — I482 Chronic atrial fibrillation, unspecified: Secondary | ICD-10-CM | POA: Diagnosis not present

## 2020-12-04 DIAGNOSIS — J449 Chronic obstructive pulmonary disease, unspecified: Secondary | ICD-10-CM | POA: Diagnosis not present

## 2020-12-04 DIAGNOSIS — E782 Mixed hyperlipidemia: Secondary | ICD-10-CM | POA: Diagnosis not present

## 2020-12-04 DIAGNOSIS — E039 Hypothyroidism, unspecified: Secondary | ICD-10-CM | POA: Diagnosis not present

## 2021-02-04 DIAGNOSIS — F419 Anxiety disorder, unspecified: Secondary | ICD-10-CM | POA: Diagnosis not present

## 2021-02-04 DIAGNOSIS — D649 Anemia, unspecified: Secondary | ICD-10-CM | POA: Diagnosis not present

## 2021-05-07 DIAGNOSIS — I10 Essential (primary) hypertension: Secondary | ICD-10-CM | POA: Diagnosis not present

## 2021-05-07 DIAGNOSIS — E7800 Pure hypercholesterolemia, unspecified: Secondary | ICD-10-CM | POA: Diagnosis not present

## 2021-06-06 DIAGNOSIS — E7800 Pure hypercholesterolemia, unspecified: Secondary | ICD-10-CM | POA: Diagnosis not present

## 2021-06-06 DIAGNOSIS — I10 Essential (primary) hypertension: Secondary | ICD-10-CM | POA: Diagnosis not present

## 2021-08-06 DIAGNOSIS — E782 Mixed hyperlipidemia: Secondary | ICD-10-CM | POA: Diagnosis not present

## 2021-08-06 DIAGNOSIS — I10 Essential (primary) hypertension: Secondary | ICD-10-CM | POA: Diagnosis not present

## 2021-08-18 DIAGNOSIS — R404 Transient alteration of awareness: Secondary | ICD-10-CM | POA: Diagnosis not present

## 2021-08-18 DIAGNOSIS — I10 Essential (primary) hypertension: Secondary | ICD-10-CM | POA: Diagnosis not present

## 2021-08-18 DIAGNOSIS — R410 Disorientation, unspecified: Secondary | ICD-10-CM | POA: Diagnosis not present

## 2021-08-18 DIAGNOSIS — R0902 Hypoxemia: Secondary | ICD-10-CM | POA: Diagnosis not present

## 2021-08-18 DIAGNOSIS — Y829 Unspecified medical devices associated with adverse incidents: Secondary | ICD-10-CM | POA: Diagnosis not present

## 2021-09-30 ENCOUNTER — Emergency Department (HOSPITAL_COMMUNITY): Payer: Medicare Other

## 2021-09-30 ENCOUNTER — Inpatient Hospital Stay (HOSPITAL_COMMUNITY): Payer: Medicare Other

## 2021-09-30 ENCOUNTER — Other Ambulatory Visit: Payer: Self-pay

## 2021-09-30 ENCOUNTER — Encounter (HOSPITAL_COMMUNITY): Payer: Self-pay

## 2021-09-30 ENCOUNTER — Inpatient Hospital Stay (HOSPITAL_COMMUNITY)
Admission: EM | Admit: 2021-09-30 | Discharge: 2021-10-03 | DRG: 522 | Disposition: A | Discharge status: Skilled Nursing Facility | Payer: Medicare Other | Attending: Family Medicine | Admitting: Family Medicine

## 2021-09-30 DIAGNOSIS — R0902 Hypoxemia: Secondary | ICD-10-CM | POA: Diagnosis not present

## 2021-09-30 DIAGNOSIS — R6889 Other general symptoms and signs: Secondary | ICD-10-CM | POA: Diagnosis not present

## 2021-09-30 DIAGNOSIS — Z87891 Personal history of nicotine dependence: Secondary | ICD-10-CM | POA: Diagnosis not present

## 2021-09-30 DIAGNOSIS — R1319 Other dysphagia: Secondary | ICD-10-CM | POA: Diagnosis present

## 2021-09-30 DIAGNOSIS — Z23 Encounter for immunization: Secondary | ICD-10-CM

## 2021-09-30 DIAGNOSIS — Z96641 Presence of right artificial hip joint: Secondary | ICD-10-CM | POA: Diagnosis not present

## 2021-09-30 DIAGNOSIS — Z8249 Family history of ischemic heart disease and other diseases of the circulatory system: Secondary | ICD-10-CM

## 2021-09-30 DIAGNOSIS — F0393 Unspecified dementia, unspecified severity, with mood disturbance: Secondary | ICD-10-CM | POA: Diagnosis present

## 2021-09-30 DIAGNOSIS — I110 Hypertensive heart disease with heart failure: Secondary | ICD-10-CM | POA: Diagnosis present

## 2021-09-30 DIAGNOSIS — W19XXXA Unspecified fall, initial encounter: Secondary | ICD-10-CM

## 2021-09-30 DIAGNOSIS — Z8781 Personal history of (healed) traumatic fracture: Secondary | ICD-10-CM | POA: Diagnosis not present

## 2021-09-30 DIAGNOSIS — I5032 Chronic diastolic (congestive) heart failure: Secondary | ICD-10-CM | POA: Diagnosis present

## 2021-09-30 DIAGNOSIS — W010XXA Fall on same level from slipping, tripping and stumbling without subsequent striking against object, initial encounter: Secondary | ICD-10-CM | POA: Diagnosis present

## 2021-09-30 DIAGNOSIS — Y92009 Unspecified place in unspecified non-institutional (private) residence as the place of occurrence of the external cause: Secondary | ICD-10-CM

## 2021-09-30 DIAGNOSIS — E039 Hypothyroidism, unspecified: Secondary | ICD-10-CM | POA: Diagnosis not present

## 2021-09-30 DIAGNOSIS — J449 Chronic obstructive pulmonary disease, unspecified: Secondary | ICD-10-CM | POA: Diagnosis not present

## 2021-09-30 DIAGNOSIS — R42 Dizziness and giddiness: Secondary | ICD-10-CM | POA: Diagnosis present

## 2021-09-30 DIAGNOSIS — I10 Essential (primary) hypertension: Secondary | ICD-10-CM | POA: Diagnosis present

## 2021-09-30 DIAGNOSIS — J439 Emphysema, unspecified: Secondary | ICD-10-CM | POA: Diagnosis not present

## 2021-09-30 DIAGNOSIS — S8991XA Unspecified injury of right lower leg, initial encounter: Secondary | ICD-10-CM | POA: Diagnosis not present

## 2021-09-30 DIAGNOSIS — K648 Other hemorrhoids: Secondary | ICD-10-CM | POA: Diagnosis present

## 2021-09-30 DIAGNOSIS — Z9981 Dependence on supplemental oxygen: Secondary | ICD-10-CM | POA: Diagnosis not present

## 2021-09-30 DIAGNOSIS — J9611 Chronic respiratory failure with hypoxia: Secondary | ICD-10-CM | POA: Diagnosis present

## 2021-09-30 DIAGNOSIS — R102 Pelvic and perineal pain: Secondary | ICD-10-CM | POA: Diagnosis not present

## 2021-09-30 DIAGNOSIS — F03918 Unspecified dementia, unspecified severity, with other behavioral disturbance: Secondary | ICD-10-CM | POA: Diagnosis not present

## 2021-09-30 DIAGNOSIS — Z01818 Encounter for other preprocedural examination: Secondary | ICD-10-CM | POA: Diagnosis not present

## 2021-09-30 DIAGNOSIS — F3289 Other specified depressive episodes: Secondary | ICD-10-CM | POA: Diagnosis not present

## 2021-09-30 DIAGNOSIS — R262 Difficulty in walking, not elsewhere classified: Secondary | ICD-10-CM | POA: Diagnosis present

## 2021-09-30 DIAGNOSIS — Z91030 Bee allergy status: Secondary | ICD-10-CM | POA: Diagnosis not present

## 2021-09-30 DIAGNOSIS — Z01811 Encounter for preprocedural respiratory examination: Secondary | ICD-10-CM

## 2021-09-30 DIAGNOSIS — Z8261 Family history of arthritis: Secondary | ICD-10-CM

## 2021-09-30 DIAGNOSIS — F32A Depression, unspecified: Secondary | ICD-10-CM | POA: Diagnosis present

## 2021-09-30 DIAGNOSIS — J189 Pneumonia, unspecified organism: Secondary | ICD-10-CM | POA: Diagnosis present

## 2021-09-30 DIAGNOSIS — Z79899 Other long term (current) drug therapy: Secondary | ICD-10-CM | POA: Diagnosis not present

## 2021-09-30 DIAGNOSIS — S72001A Fracture of unspecified part of neck of right femur, initial encounter for closed fracture: Principal | ICD-10-CM | POA: Diagnosis present

## 2021-09-30 DIAGNOSIS — M6281 Muscle weakness (generalized): Secondary | ICD-10-CM | POA: Diagnosis present

## 2021-09-30 DIAGNOSIS — S72041D Displaced fracture of base of neck of right femur, subsequent encounter for closed fracture with routine healing: Secondary | ICD-10-CM | POA: Diagnosis not present

## 2021-09-30 DIAGNOSIS — R4189 Other symptoms and signs involving cognitive functions and awareness: Secondary | ICD-10-CM | POA: Diagnosis present

## 2021-09-30 DIAGNOSIS — Z743 Need for continuous supervision: Secondary | ICD-10-CM | POA: Diagnosis not present

## 2021-09-30 DIAGNOSIS — E785 Hyperlipidemia, unspecified: Secondary | ICD-10-CM | POA: Diagnosis present

## 2021-09-30 DIAGNOSIS — Z79890 Hormone replacement therapy (postmenopausal): Secondary | ICD-10-CM

## 2021-09-30 DIAGNOSIS — Z823 Family history of stroke: Secondary | ICD-10-CM

## 2021-09-30 DIAGNOSIS — R2689 Other abnormalities of gait and mobility: Secondary | ICD-10-CM | POA: Diagnosis present

## 2021-09-30 DIAGNOSIS — Z20822 Contact with and (suspected) exposure to covid-19: Secondary | ICD-10-CM | POA: Diagnosis not present

## 2021-09-30 DIAGNOSIS — Z471 Aftercare following joint replacement surgery: Secondary | ICD-10-CM | POA: Diagnosis not present

## 2021-09-30 DIAGNOSIS — Z803 Family history of malignant neoplasm of breast: Secondary | ICD-10-CM | POA: Diagnosis not present

## 2021-09-30 DIAGNOSIS — M79604 Pain in right leg: Secondary | ICD-10-CM | POA: Diagnosis not present

## 2021-09-30 DIAGNOSIS — Z9889 Other specified postprocedural states: Secondary | ICD-10-CM

## 2021-09-30 LAB — CBC WITH DIFFERENTIAL/PLATELET
Abs Immature Granulocytes: 0.03 10*3/uL (ref 0.00–0.07)
Basophils Absolute: 0 10*3/uL (ref 0.0–0.1)
Basophils Relative: 0 %
Eosinophils Absolute: 0 10*3/uL (ref 0.0–0.5)
Eosinophils Relative: 0 %
HCT: 45.3 % (ref 36.0–46.0)
Hemoglobin: 13.5 g/dL (ref 12.0–15.0)
Immature Granulocytes: 0 %
Lymphocytes Relative: 5 %
Lymphs Abs: 0.5 10*3/uL — ABNORMAL LOW (ref 0.7–4.0)
MCH: 29 pg (ref 26.0–34.0)
MCHC: 29.8 g/dL — ABNORMAL LOW (ref 30.0–36.0)
MCV: 97.2 fL (ref 80.0–100.0)
Monocytes Absolute: 0.9 10*3/uL (ref 0.1–1.0)
Monocytes Relative: 8 %
Neutro Abs: 9.7 10*3/uL — ABNORMAL HIGH (ref 1.7–7.7)
Neutrophils Relative %: 87 %
Platelets: 192 10*3/uL (ref 150–400)
RBC: 4.66 MIL/uL (ref 3.87–5.11)
RDW: 12.9 % (ref 11.5–15.5)
WBC: 11.2 10*3/uL — ABNORMAL HIGH (ref 4.0–10.5)
nRBC: 0 % (ref 0.0–0.2)

## 2021-09-30 LAB — BASIC METABOLIC PANEL
Anion gap: 6 (ref 5–15)
BUN: 20 mg/dL (ref 8–23)
CO2: 38 mmol/L — ABNORMAL HIGH (ref 22–32)
Calcium: 8.8 mg/dL — ABNORMAL LOW (ref 8.9–10.3)
Chloride: 96 mmol/L — ABNORMAL LOW (ref 98–111)
Creatinine, Ser: 0.69 mg/dL (ref 0.44–1.00)
GFR, Estimated: >60 mL/min (ref >60)
Glucose, Bld: 149 mg/dL — ABNORMAL HIGH (ref 70–99)
Potassium: 3.6 mmol/L (ref 3.5–5.1)
Sodium: 140 mmol/L (ref 135–145)

## 2021-09-30 LAB — TYPE AND SCREEN
ABO/RH(D): A POS
Antibody Screen: NEGATIVE

## 2021-09-30 LAB — RESP PANEL BY RT-PCR (FLU A&B, COVID) ARPGX2
Influenza A by PCR: NEGATIVE
Influenza B by PCR: NEGATIVE
SARS Coronavirus 2 by RT PCR: NEGATIVE

## 2021-09-30 MED ORDER — INFLUENZA VAC A&B SA ADJ QUAD 0.5 ML IM PRSY
0.5000 mL | PREFILLED_SYRINGE | INTRAMUSCULAR | Status: AC
  Administered 2021-10-01: 17:00:00 0.5 mL via INTRAMUSCULAR
  Filled 2021-09-30: qty 0.5

## 2021-09-30 MED ORDER — ALPRAZOLAM 0.5 MG PO TABS
0.5000 mg | ORAL_TABLET | Freq: Three times a day (TID) | ORAL | Status: DC | PRN
  Administered 2021-10-03: 0.5 mg via ORAL
  Filled 2021-09-30: qty 1

## 2021-09-30 MED ORDER — POTASSIUM CHLORIDE 20 MEQ PO PACK
40.0000 meq | PACK | Freq: Once | ORAL | Status: AC
  Administered 2021-09-30: 22:00:00 40 meq via ORAL
  Filled 2021-09-30: qty 2

## 2021-09-30 MED ORDER — LEVALBUTEROL HCL 0.63 MG/3ML IN NEBU
0.6300 mg | INHALATION_SOLUTION | Freq: Two times a day (BID) | RESPIRATORY_TRACT | Status: DC
  Administered 2021-09-30 – 2021-10-03 (×6): 0.63 mg via RESPIRATORY_TRACT
  Filled 2021-09-30 (×6): qty 3

## 2021-09-30 MED ORDER — AMLODIPINE BESYLATE 5 MG PO TABS
10.0000 mg | ORAL_TABLET | Freq: Every day | ORAL | Status: DC
  Administered 2021-09-30 – 2021-10-03 (×4): 10 mg via ORAL
  Filled 2021-09-30 (×4): qty 2

## 2021-09-30 MED ORDER — BUPROPION HCL ER (XL) 150 MG PO TB24
150.0000 mg | ORAL_TABLET | Freq: Every day | ORAL | Status: DC
  Administered 2021-09-30 – 2021-10-03 (×4): 150 mg via ORAL
  Filled 2021-09-30 (×4): qty 1

## 2021-09-30 MED ORDER — ALBUTEROL SULFATE (2.5 MG/3ML) 0.083% IN NEBU: 2.5000 mg | INHALATION_SOLUTION | RESPIRATORY_TRACT | Status: DC | PRN

## 2021-09-30 MED ORDER — LEVOTHYROXINE SODIUM 50 MCG PO TABS
50.0000 ug | ORAL_TABLET | Freq: Every day | ORAL | Status: DC
  Administered 2021-10-02 – 2021-10-03 (×2): 50 ug via ORAL
  Filled 2021-09-30 (×2): qty 1

## 2021-09-30 MED ORDER — MORPHINE SULFATE (PF) 2 MG/ML IV SOLN
2.0000 mg | INTRAVENOUS | Status: DC | PRN
  Administered 2021-10-01 (×2): 2 mg via INTRAVENOUS
  Filled 2021-09-30 (×2): qty 1

## 2021-09-30 MED ORDER — SODIUM CHLORIDE 0.9 % IV SOLN: INTRAVENOUS | Status: DC

## 2021-09-30 MED ORDER — ALBUTEROL SULFATE (2.5 MG/3ML) 0.083% IN NEBU: 2.5000 mg | INHALATION_SOLUTION | Freq: Four times a day (QID) | RESPIRATORY_TRACT | Status: DC

## 2021-09-30 MED ORDER — POLYETHYLENE GLYCOL 3350 17 G PO PACK: 17.0000 g | PACK | Freq: Every day | ORAL | Status: DC | PRN

## 2021-09-30 NOTE — ED Notes (Signed)
Attempted to call report

## 2021-09-30 NOTE — H&P (Signed)
History and Physical    [name] QQI:[id number] DOB: 11-24-1940 DOA: 09/30/2021  PCP: [name], MD   Patient coming from: Home  I have personally briefly reviewed patient's old medical records in [place]  Chief Complaint: fall  HPI: [name] is a 81 y.o. female with medical history significant for COPD with chronic respiratory failure, dementia, depression, fall, CHF, hypertension. Patient was brought to the ED via EMS with reports of fall.  Patient reports she got up from a sitting position and turned around quickly lost her balance and fell.  She did not hit her head.  Since fall she has had pain to her right lower extremity.  She has not been able to walk.  She reports mild intermittent dizziness chronic and stable for several years.  ED Course: Stable vitals.  Pelvic x-ray shows displaced right femoral neck fracture. EDP talked to Dr. Aline Brochure, he will see in the morning.  Review of Systems: As per HPI all other systems reviewed and negative.  Past Medical History:  Diagnosis Date   CHF (congestive heart failure) (HCC)    COPD (chronic obstructive pulmonary disease) ([place])    home O2   Diastolic dysfunction    Dyspnea    History of nuclear stress test 09/23/2009   dipyridamole; normal pattern of perfusion; low risk scan    History of tobacco abuse    Hyperlipidemia    Hypertension    Hypothyroidism    Mild depression    Pneumonia     Past Surgical History:  Procedure Laterality Date   BIOPSY  01/13/2017   Procedure: BIOPSY;  Surgeon: Daneil Dolin, MD;  Location: [facility];  Service: Endoscopy;;  right/left/sigmoid colon   CATARACT EXTRACTION W/PHACO  09/08/2012   Procedure: CATARACT EXTRACTION PHACO AND INTRAOCULAR LENS PLACEMENT ([place]);  Surgeon: Tonny Branch, MD;  Location: [facility];  Service: Ophthalmology;  Laterality: Right;  CDE:19.67   CATARACT EXTRACTION W/PHACO  09/19/2012   Procedure: CATARACT EXTRACTION PHACO AND INTRAOCULAR LENS PLACEMENT  ([facility]);  Surgeon: Tonny Branch, MD;  Location: [facility];  Service: Ophthalmology;  Laterality: Left;  CDE:19.83   COLONOSCOPY  2007   Dr. Gala Romney: internal hemorrhoids, benign polyps   COLONOSCOPY N/A 01/13/2017   Procedure: COLONOSCOPY;  Surgeon: Daneil Dolin, MD;  Location: [facility];  Service: Endoscopy;  Laterality: N/A;  12:15pm   ESOPHAGOGASTRODUODENOSCOPY  2007   Dr. Gala Romney: normal esophagus, non-critical Schatzki's ring not manipulated, normal small bowel biopsy   ESOPHAGOGASTRODUODENOSCOPY (EGD) WITH ESOPHAGEAL DILATION N/A 10/16/2013   Dr. Fields:Schatzki's ring at the gastroesophageal junction s/p dilation/MODERATE Erosive gastritis. reactive gastropathy.    GALLBLADDER SURGERY  1977   INTRAMEDULLARY (IM) NAIL INTERTROCHANTERIC Left 08/03/2018   Procedure: INTRAMEDULLARY (IM) NAIL INTERTROCHANTRIC;  Surgeon: Altamese Diaperville, MD;  Location: [facility];  Service: Orthopedics;  Laterality: Left;   right carpal tunnel release     TONSILLECTOMY     TOTAL ABDOMINAL HYSTERECTOMY  1980's   TRANSTHORACIC ECHOCARDIOGRAM  09/23/2009   EF>55%; trace TR; normal LA & RA size; normal LV & RV size & systolic function      reports that she quit smoking about 13 years ago. Her smoking use included cigarettes. She has a 50.00 pack-year smoking history. She has never used smokeless tobacco. She reports that she does not drink alcohol and does not use drugs.  Allergies  Allergen Reactions   Bee Venom Swelling    SWELLING REACTION UNSPECIFIED     Family  History  Problem Relation Age of Onset   Allergies Father    Heart disease Mother    Rheum arthritis Mother    Hypertension Mother    Stroke Maternal Grandmother    Breast cancer Daughter    Breast cancer Sister    Cancer Maternal Aunt    Stroke Sister    Colon cancer Neg Hx    Prior to Admission medications   Medication Sig Start Date End Date Taking? Authorizing Provider  acetaminophen (TYLENOL) 325 MG tablet Take 2 tablets (650 mg total) by  mouth every 6 (six) hours as needed for mild pain (or Fever >/= 101). 02/28/18   Roxan Hockey, MD  albuterol (PROVENTIL) (2.5 MG/3ML) 0.083% nebulizer solution Take 3 mLs (2.5 mg total) by nebulization every 6 (six) hours as needed for wheezing or shortness of breath. 06/09/20   Oswald Hillock, MD  ALPRAZolam Duanne Moron) 0.5 MG tablet Take 1 tablet (0.5 mg total) by mouth 3 (three) times daily as needed for anxiety. 06/09/20   Oswald Hillock, MD  amLODipine (NORVASC) 10 MG tablet Take 1 tablet (10 mg total) by mouth daily. 06/10/20   Oswald Hillock, MD  Ascorbic Acid (VITAMIN C PO) Take 1 tablet by mouth every morning.     [provider]  buPROPion (WELLBUTRIN XL) 150 MG 24 hr tablet Take 150 mg by mouth daily.    [provider]  feeding supplement, ENSURE ENLIVE, (ENSURE ENLIVE) LIQD Take 237 mLs by mouth 2 (two) times daily between meals. 08/08/18   Samuella Cota, MD  guaiFENesin (MUCINEX) 600 MG 12 hr tablet Take 1 tablet (600 mg total) by mouth 2 (two) times daily. 06/09/20   Oswald Hillock, MD  levothyroxine (SYNTHROID, LEVOTHROID) 50 MCG tablet Take 50 mcg by mouth daily before breakfast.    [provider]  Multiple Vitamin (MULTIVITAMIN) capsule Take 1 capsule by mouth every morning.     [provider]  oxyCODONE (OXY IR/ROXICODONE) 5 MG immediate release tablet Take 1 tablet (5 mg total) by mouth every 6 (six) hours as needed for moderate pain. 06/09/20   Oswald Hillock, MD  pantoprazole (PROTONIX) 40 MG tablet Take 1 tablet (40 mg total) by mouth daily before breakfast. 02/28/18   Denton Brick, Courage, MD  polyethylene glycol (MIRALAX / GLYCOLAX) packet Take 17 g by mouth 2 (two) times daily. Patient taking differently: Take 17 g by mouth daily as needed for mild constipation or moderate constipation.  02/28/18   Roxan Hockey, MD  pravastatin (PRAVACHOL) 40 MG tablet Take 1 tablet (40 mg total) by mouth at bedtime. Patient taking differently: Take 40 mg by mouth  every morning.  02/28/18   Roxan Hockey, MD  tiotropium (SPIRIVA) 18 MCG inhalation capsule Place 1 capsule (18 mcg total) into inhaler and inhale daily. Patient taking differently: Place 18 mcg into inhaler and inhale daily as needed (for shortness of breath).  02/26/17 08/01/18  Javier Glazier, MD  torsemide (DEMADEX) 20 MG tablet Take 1 tablet (20 mg total) by mouth once. Patient taking differently: Take 20 mg by mouth daily.  10/30/15   Marshell Garfinkel, MD    Physical Exam: Vitals:   09/30/21 1700 09/30/21 1730 09/30/21 1800 09/30/21 1830  BP: (!) 165/55 (!) 167/60 (!) 159/71 (!) 169/56  Pulse: 76 84 85 85  Resp:      Temp:      TempSrc:      SpO2: 100% 100% 99% 99%  Weight:  Height:        Constitutional: NAD, calm, comfortable Vitals:   09/30/21 1700 09/30/21 1730 09/30/21 1800 09/30/21 1830  BP: (!) 165/55 (!) 167/60 (!) 159/71 (!) 169/56  Pulse: 76 84 85 85  Resp:      Temp:      TempSrc:      SpO2: 100% 100% 99% 99%  Weight:      Height:       Eyes: PERRL, lids and conjunctivae normal ENMT: Mucous membranes are moist. .  Neck: normal, supple, no masses, no thyromegaly Respiratory: clear to auscultation bilaterally, no wheezing, no crackles. Normal respiratory effort. No accessory muscle use.  Cardiovascular: Regular rate and rhythm, no murmurs / rubs / gallops. No extremity edema.  Lower extremities warm. Abdomen: no tenderness, no masses palpated. No hepatosplenomegaly. Bowel sounds positive.  Musculoskeletal: no clubbing / cyanosis. No joint deformity upper and lower extremities. Good ROM, no contractures. Normal muscle tone.  Skin: no rashes, lesions, ulcers. No induration Neurologic: No apparent cranial nerve abnormality, moving all 3 extremities spontaneously- right lower extremity not tested. Psychiatric: Normal judgment and insight. Alert and oriented x 3. Normal mood.   Labs on Admission: I have personally reviewed following labs and imaging  studies  CBC: Recent Labs  Lab 09/30/21 1820  WBC 11.2*  NEUTROABS 9.7*  HGB 13.5  HCT 45.3  MCV 97.2  PLT 245   Basic Metabolic Panel: Recent Labs  Lab 09/30/21 1820  NA 140  K 3.6  CL 96*  CO2 38*  GLUCOSE 149*  BUN 20  CREATININE 0.69  CALCIUM 8.8*    Radiological Exams on Admission: DG Pelvis Portable  Result Date: 09/30/2021 CLINICAL DATA:  Pain after fall last night. EXAM: PORTABLE PELVIS 1-2 VIEWS COMPARISON:  Pelvis radiograph 08/01/2018 FINDINGS: Displaced right femoral neck fracture. The femoral head remains seated. The bones are diffusely under mineralized. Intact pubic rami. Pubic symphysis and sacroiliac joints are congruent. Surgical hardware traverses prior intertrochanteric left femur fracture. Prominent vascular calcifications. IMPRESSION: Displaced right femoral neck fracture. Electronically Signed   By: Keith Rake M.D.   On: 09/30/2021 17:45   DG FEMUR PORT, MIN 2 VIEWS RIGHT  Result Date: 09/30/2021 CLINICAL DATA:  Right hip pain after fall last night. EXAM: RIGHT FEMUR PORTABLE 2 VIEW COMPARISON:  None. FINDINGS: Displaced right femoral neck fracture. The femoral head remains seated. Diffuse bony under mineralization. Osteoarthritis of the knee. Soft tissues are atrophic. Vascular calcifications are seen. IMPRESSION: Displaced right femoral neck fracture. Electronically Signed   By: Keith Rake M.D.   On: 09/30/2021 17:44    EKG: pending  Assessment/Plan Principal Problem:   Closed displaced fracture of right femoral neck (HCC) Active Problems:   COPD Golds D, frequent exacerbations   Hypertension   Depression   Chronic respiratory failure with hypoxia (HCC)   Fall at home, initial encounter   Dementia with behavioral disturbance   Mechanical fall with subsequent closed displaced fracture of the right femoral neck-  - EDP talked to Dr. Aline Brochure will see in the morning -N.p.o. midnight - N/s 75cc/hr -IV morphine 2 mg every 4 hours as  needed -Preop chest x-ray, EKG  COPD chronic respiratory failure-on 3 L.  Lung exam clear. -Resume home bronchodilator regimen  Hypertension- Elevated -resume Norvasc,  Depression-stable. - Resume Wellbutrin  Dementia-stable. Lives at home with spouse.   DVT prophylaxis: SCDs Code Status: Full code Family Communication: None at bedside, talked to patient's son Armya Westerhoff on the phone, updated  about plans. Disposition Plan: > 2 days Consults called: Orthopedics Admission status: inpt, med surg I certify that at the point of admission it is my clinical judgment that the patient will require inpatient hospital care spanning beyond 2 midnights from the point of admission due to high intensity of service, high risk for further deterioration and high frequency of surveillance required.    Bethena Roys MD [facility]  09/30/2021, 8:51 PM

## 2021-09-30 NOTE — ED Provider Notes (Signed)
[facility] EMERGENCY DEPARTMENT Provider Note   CSN: [id number] Arrival date & time: 09/30/21  1610     History  Chief Complaint  Patient presents with   Lytle Michaels    [name] is a 81 y.o. female.  HPI Patient presents via EMS with concern for right thigh and hip pain.  She had a fall yesterday which she recalls in its entirety.  She denies head trauma, has no pain in her head, neck, chest, abdomen.  However, she has been unable to walk secondary to severe pain in her right, thigh and hip.  EMS reports no hemodynamic instability in route.    Home Medications Prior to Admission medications   Medication Sig Start Date End Date Taking? Authorizing Provider  acetaminophen (TYLENOL) 325 MG tablet Take 2 tablets (650 mg total) by mouth every 6 (six) hours as needed for mild pain (or Fever >/= 101). 02/28/18   Roxan Hockey, MD  albuterol (PROVENTIL) (2.5 MG/3ML) 0.083% nebulizer solution Take 3 mLs (2.5 mg total) by nebulization every 6 (six) hours as needed for wheezing or shortness of breath. 06/09/20   Oswald Hillock, MD  ALPRAZolam Duanne Moron) 0.5 MG tablet Take 1 tablet (0.5 mg total) by mouth 3 (three) times daily as needed for anxiety. 06/09/20   Oswald Hillock, MD  amLODipine (NORVASC) 10 MG tablet Take 1 tablet (10 mg total) by mouth daily. 06/10/20   Oswald Hillock, MD  Ascorbic Acid (VITAMIN C PO) Take 1 tablet by mouth every morning.     [provider]  buPROPion (WELLBUTRIN XL) 150 MG 24 hr tablet Take 150 mg by mouth daily.    [provider]  feeding supplement, ENSURE ENLIVE, (ENSURE ENLIVE) LIQD Take 237 mLs by mouth 2 (two) times daily between meals. 08/08/18   Samuella Cota, MD  guaiFENesin (MUCINEX) 600 MG 12 hr tablet Take 1 tablet (600 mg total) by mouth 2 (two) times daily. 06/09/20   Oswald Hillock, MD  levothyroxine (SYNTHROID, LEVOTHROID) 50 MCG tablet Take 50 mcg by mouth daily before breakfast.    [provider]  Multiple Vitamin  (MULTIVITAMIN) capsule Take 1 capsule by mouth every morning.     [provider]  oxyCODONE (OXY IR/ROXICODONE) 5 MG immediate release tablet Take 1 tablet (5 mg total) by mouth every 6 (six) hours as needed for moderate pain. 06/09/20   Oswald Hillock, MD  pantoprazole (PROTONIX) 40 MG tablet Take 1 tablet (40 mg total) by mouth daily before breakfast. 02/28/18   Denton Brick, Courage, MD  polyethylene glycol (MIRALAX / GLYCOLAX) packet Take 17 g by mouth 2 (two) times daily. Patient taking differently: Take 17 g by mouth daily as needed for mild constipation or moderate constipation.  02/28/18   Roxan Hockey, MD  pravastatin (PRAVACHOL) 40 MG tablet Take 1 tablet (40 mg total) by mouth at bedtime. Patient taking differently: Take 40 mg by mouth every morning.  02/28/18   Roxan Hockey, MD  tiotropium (SPIRIVA) 18 MCG inhalation capsule Place 1 capsule (18 mcg total) into inhaler and inhale daily. Patient taking differently: Place 18 mcg into inhaler and inhale daily as needed (for shortness of breath).  02/26/17 08/01/18  Javier Glazier, MD  torsemide (DEMADEX) 20 MG tablet Take 1 tablet (20 mg total) by mouth once. Patient taking differently: Take 20 mg by mouth daily.  10/30/15   Marshell Garfinkel, MD      Allergies    Bee venom  Review of Systems   Review of Systems  Constitutional:        Per HPI, otherwise negative  HENT:         Per HPI, otherwise negative  Respiratory:         Per HPI, otherwise negative  Cardiovascular:        Per HPI, otherwise negative  Gastrointestinal:  Negative for vomiting.  Endocrine:       Negative aside from HPI  Genitourinary:        Neg aside from HPI   Musculoskeletal:        Per HPI, otherwise negative  Skin: Negative.   Neurological:  Negative for syncope.   Physical Exam Updated Vital Signs BP (!) 145/59 (BP Location: Right Arm)    Pulse 76    Temp (!) 97.1 F (36.2 C) (Oral)    Resp 18    Ht 5' (1.524 m)    Wt 40.8 kg    SpO2  100%    BMI 17.58 kg/m  Physical Exam Vitals and nursing note reviewed.  Constitutional:      General: She is not in acute distress.    Appearance: She is well-developed.  HENT:     Head: Normocephalic and atraumatic.  Eyes:     Conjunctiva/sclera: Conjunctivae normal.  Cardiovascular:     Rate and Rhythm: Normal rate and regular rhythm.  Pulmonary:     Effort: Pulmonary effort is normal. No respiratory distress.     Breath sounds: Normal breath sounds. No stridor.  Abdominal:     General: There is no distension.  Musculoskeletal:       Legs:  Skin:    General: Skin is warm and dry.  Neurological:     Mental Status: She is alert and oriented to person, place, and time.     Cranial Nerves: No cranial nerve deficit.    ED Results / Procedures / Treatments   Labs (all labs ordered are listed, but only abnormal results are displayed) Labs Reviewed  RESP PANEL BY RT-PCR (FLU A&B, COVID) ARPGX2  BASIC METABOLIC PANEL  CBC WITH DIFFERENTIAL/PLATELET    EKG None  Radiology DG Pelvis Portable  Result Date: 09/30/2021 CLINICAL DATA:  Pain after fall last night. EXAM: PORTABLE PELVIS 1-2 VIEWS COMPARISON:  Pelvis radiograph 08/01/2018 FINDINGS: Displaced right femoral neck fracture. The femoral head remains seated. The bones are diffusely under mineralized. Intact pubic rami. Pubic symphysis and sacroiliac joints are congruent. Surgical hardware traverses prior intertrochanteric left femur fracture. Prominent vascular calcifications. IMPRESSION: Displaced right femoral neck fracture. Electronically Signed   By: Keith Rake M.D.   On: 09/30/2021 17:45   DG FEMUR PORT, MIN 2 VIEWS RIGHT  Result Date: 09/30/2021 CLINICAL DATA:  Right hip pain after fall last night. EXAM: RIGHT FEMUR PORTABLE 2 VIEW COMPARISON:  None. FINDINGS: Displaced right femoral neck fracture. The femoral head remains seated. Diffuse bony under mineralization. Osteoarthritis of the knee. Soft tissues are  atrophic. Vascular calcifications are seen. IMPRESSION: Displaced right femoral neck fracture. Electronically Signed   By: Keith Rake M.D.   On: 09/30/2021 17:44    Procedures Procedures    Medications Ordered in ED Medications - No data to display  ED Course/ Medical Decision Making/ A&P  6:14 PM Patient and son aware of all findings                         Medical Decision Making Adult female presents 1  day after fall, now with ongoing right hip pain.  Patient is awake and alert, but has evidence of chronic illness with ongoing oxygen dependency, due to COPD.  She is distally neurovascularly intact.  X-rays reviewed, labs reviewed, interpretation of x-rays consistent with right femoral neck fracture.  I discussed this with our orthopedic team, internal medicine team, patient will require admission for surgical repair.  Amount and/or Complexity of Data Reviewed Independent Historian: caregiver External Data Reviewed: notes.    Details: Discharge October 2022 pneumonia Labs: ordered. Decision-making details documented in ED Course. Radiology: ordered. Decision-making details documented in ED Course.  Risk Prescription drug management. Decision regarding hospitalization. Emergency major surgery. Risk Details: Surgical repair of femur likely to occur tomorrow.  Critical Care Total time providing critical care: < 30 minutes       Final Clinical Impression(s) / ED Diagnoses Final diagnoses:  Closed fracture of right hip, initial encounter [facility])     Carmin Muskrat, MD 09/30/21 1816

## 2021-09-30 NOTE — ED Triage Notes (Addendum)
Pt fell yesterday complaining of right leg/hip pain and lower back pain.  Pain 0 with no movement,  pt states she tripped and fell denies hitting her head and denies being on blood thinners

## 2021-10-01 ENCOUNTER — Inpatient Hospital Stay (HOSPITAL_COMMUNITY): Payer: Medicare Other | Admitting: Certified Registered"

## 2021-10-01 ENCOUNTER — Encounter (HOSPITAL_COMMUNITY)
Admission: EM | Disposition: A | Discharge status: Skilled Nursing Facility | Payer: Self-pay | Source: Home / Self Care | Attending: Family Medicine

## 2021-10-01 ENCOUNTER — Encounter (HOSPITAL_COMMUNITY): Payer: Self-pay | Admitting: Internal Medicine

## 2021-10-01 ENCOUNTER — Inpatient Hospital Stay (HOSPITAL_COMMUNITY): Payer: Medicare Other

## 2021-10-01 ENCOUNTER — Other Ambulatory Visit: Payer: Self-pay

## 2021-10-01 DIAGNOSIS — F3289 Other specified depressive episodes: Secondary | ICD-10-CM

## 2021-10-01 DIAGNOSIS — F32A Depression, unspecified: Secondary | ICD-10-CM

## 2021-10-01 HISTORY — PX: HIP ARTHROPLASTY: SHX981

## 2021-10-01 LAB — SURGICAL PCR SCREEN
MRSA, PCR: NEGATIVE
Staphylococcus aureus: NEGATIVE

## 2021-10-01 SURGERY — HEMIARTHROPLASTY, HIP, DIRECT ANTERIOR APPROACH, FOR FRACTURE
Anesthesia: Spinal | Site: Hip | Laterality: Right

## 2021-10-01 MED ORDER — CHLORHEXIDINE GLUCONATE CLOTH 2 % EX PADS
6.0000 | MEDICATED_PAD | Freq: Every day | CUTANEOUS | Status: DC
  Administered 2021-10-01 – 2021-10-03 (×3): 6 via TOPICAL

## 2021-10-01 MED ORDER — HYDROCODONE-ACETAMINOPHEN 5-325 MG PO TABS
1.0000 | ORAL_TABLET | ORAL | Status: DC | PRN
  Administered 2021-10-02: 2 via ORAL
  Administered 2021-10-03: 1 via ORAL
  Filled 2021-10-01: qty 1
  Filled 2021-10-01: qty 2

## 2021-10-01 MED ORDER — BISACODYL 5 MG PO TBEC: 5.0000 mg | DELAYED_RELEASE_TABLET | Freq: Every day | ORAL | Status: DC | PRN

## 2021-10-01 MED ORDER — MORPHINE SULFATE (PF) 2 MG/ML IV SOLN: 0.5000 mg | INTRAVENOUS | Status: DC | PRN

## 2021-10-01 MED ORDER — CEFAZOLIN SODIUM-DEXTROSE 2-4 GM/100ML-% IV SOLN
INTRAVENOUS | Status: AC
  Filled 2021-10-01: qty 100

## 2021-10-01 MED ORDER — ALUM & MAG HYDROXIDE-SIMETH 200-200-20 MG/5ML PO SUSP: 30.0000 mL | ORAL | Status: DC | PRN

## 2021-10-01 MED ORDER — METOCLOPRAMIDE HCL 10 MG PO TABS: 5.0000 mg | ORAL_TABLET | Freq: Three times a day (TID) | ORAL | Status: DC | PRN

## 2021-10-01 MED ORDER — CEFAZOLIN SODIUM-DEXTROSE 2-4 GM/100ML-% IV SOLN
2.0000 g | INTRAVENOUS | Status: AC
  Administered 2021-10-01: 14:00:00 2 g via INTRAVENOUS

## 2021-10-01 MED ORDER — CHLORHEXIDINE GLUCONATE 0.12 % MT SOLN: 15.0000 mL | Freq: Once | OROMUCOSAL | Status: DC

## 2021-10-01 MED ORDER — STERILE WATER FOR IRRIGATION IR SOLN
Status: DC | PRN
Start: 2021-10-01 — End: 2021-10-01
  Administered 2021-10-01: 1000 mL
  Administered 2021-10-01: 500 mL

## 2021-10-01 MED ORDER — METHOCARBAMOL 500 MG PO TABS
500.0000 mg | ORAL_TABLET | Freq: Four times a day (QID) | ORAL | Status: DC | PRN
  Administered 2021-10-01: 20:00:00 500 mg via ORAL
  Filled 2021-10-01: qty 1

## 2021-10-01 MED ORDER — SODIUM CHLORIDE 0.9 % IV SOLN: INTRAVENOUS | Status: DC

## 2021-10-01 MED ORDER — TRANEXAMIC ACID-NACL 1000-0.7 MG/100ML-% IV SOLN
1000.0000 mg | Freq: Once | INTRAVENOUS | Status: AC
  Administered 2021-10-01: 17:00:00 1000 mg via INTRAVENOUS
  Filled 2021-10-01: qty 100

## 2021-10-01 MED ORDER — LACTATED RINGERS IV SOLN: INTRAVENOUS | Status: DC | PRN

## 2021-10-01 MED ORDER — TRANEXAMIC ACID-NACL 1000-0.7 MG/100ML-% IV SOLN
1000.0000 mg | INTRAVENOUS | Status: AC
  Administered 2021-10-01: 13:00:00 1000 mg via INTRAVENOUS
  Filled 2021-10-01: qty 100

## 2021-10-01 MED ORDER — ORAL CARE MOUTH RINSE: 15.0000 mL | Freq: Once | OROMUCOSAL | Status: DC

## 2021-10-01 MED ORDER — METOCLOPRAMIDE HCL 5 MG/ML IJ SOLN: 5.0000 mg | Freq: Three times a day (TID) | INTRAMUSCULAR | Status: DC | PRN

## 2021-10-01 MED ORDER — LACTATED RINGERS IV SOLN: INTRAVENOUS | Status: DC

## 2021-10-01 MED ORDER — TRAMADOL HCL 50 MG PO TABS
50.0000 mg | ORAL_TABLET | Freq: Four times a day (QID) | ORAL | Status: DC
  Administered 2021-10-01 – 2021-10-03 (×8): 50 mg via ORAL
  Filled 2021-10-01 (×9): qty 1

## 2021-10-01 MED ORDER — BUPIVACAINE HCL (PF) 0.5 % IJ SOLN
INTRAMUSCULAR | Status: AC
  Filled 2021-10-01: qty 30

## 2021-10-01 MED ORDER — SENNOSIDES-DOCUSATE SODIUM 8.6-50 MG PO TABS: 1.0000 | ORAL_TABLET | Freq: Every evening | ORAL | Status: DC | PRN

## 2021-10-01 MED ORDER — ONDANSETRON HCL 4 MG PO TABS: 4.0000 mg | ORAL_TABLET | Freq: Four times a day (QID) | ORAL | Status: DC | PRN

## 2021-10-01 MED ORDER — CHLORHEXIDINE GLUCONATE 4 % EX LIQD
60.0000 mL | Freq: Once | CUTANEOUS | Status: AC
  Administered 2021-10-01: 4 via TOPICAL
  Filled 2021-10-01: qty 60

## 2021-10-01 MED ORDER — PHENOL 1.4 % MT LIQD: 1.0000 | OROMUCOSAL | Status: DC | PRN

## 2021-10-01 MED ORDER — ACETAMINOPHEN 500 MG PO TABS
500.0000 mg | ORAL_TABLET | Freq: Four times a day (QID) | ORAL | Status: AC
  Administered 2021-10-01 – 2021-10-02 (×4): 500 mg via ORAL
  Filled 2021-10-01 (×4): qty 1

## 2021-10-01 MED ORDER — DEXAMETHASONE SODIUM PHOSPHATE 10 MG/ML IJ SOLN
8.0000 mg | Freq: Once | INTRAMUSCULAR | Status: AC
  Administered 2021-10-01: 09:00:00 8 mg via INTRAVENOUS
  Filled 2021-10-01: qty 1

## 2021-10-01 MED ORDER — PANTOPRAZOLE SODIUM 40 MG PO TBEC
40.0000 mg | DELAYED_RELEASE_TABLET | Freq: Every day | ORAL | Status: DC
  Administered 2021-10-01 – 2021-10-03 (×3): 40 mg via ORAL
  Filled 2021-10-01 (×3): qty 1

## 2021-10-01 MED ORDER — DEXTROSE 5 % IV SOLN
500.0000 mg | Freq: Four times a day (QID) | INTRAVENOUS | Status: DC | PRN
  Filled 2021-10-01: qty 5

## 2021-10-01 MED ORDER — 0.9 % SODIUM CHLORIDE (POUR BTL) OPTIME
TOPICAL | Status: DC | PRN
  Administered 2021-10-01: 14:00:00 1000 mL

## 2021-10-01 MED ORDER — CEFAZOLIN SODIUM-DEXTROSE 2-4 GM/100ML-% IV SOLN
2.0000 g | Freq: Four times a day (QID) | INTRAVENOUS | Status: AC
  Administered 2021-10-01 – 2021-10-02 (×2): 2 g via INTRAVENOUS
  Filled 2021-10-01 (×2): qty 100

## 2021-10-01 MED ORDER — BUPIVACAINE LIPOSOME 1.3 % IJ SUSP
INTRAMUSCULAR | Status: AC
  Filled 2021-10-01: qty 20

## 2021-10-01 MED ORDER — ASPIRIN EC 325 MG PO TBEC
325.0000 mg | DELAYED_RELEASE_TABLET | Freq: Every day | ORAL | Status: DC
  Administered 2021-10-02 – 2021-10-03 (×2): 325 mg via ORAL
  Filled 2021-10-01 (×2): qty 1

## 2021-10-01 MED ORDER — BUPIVACAINE LIPOSOME 1.3 % IJ SUSP
INTRAMUSCULAR | Status: DC | PRN
  Administered 2021-10-01: 20 mL

## 2021-10-01 MED ORDER — FENTANYL CITRATE PF 50 MCG/ML IJ SOSY: 25.0000 ug | PREFILLED_SYRINGE | INTRAMUSCULAR | Status: DC | PRN

## 2021-10-01 MED ORDER — DOCUSATE SODIUM 100 MG PO CAPS
100.0000 mg | ORAL_CAPSULE | Freq: Two times a day (BID) | ORAL | Status: DC
  Administered 2021-10-01 – 2021-10-03 (×4): 100 mg via ORAL
  Filled 2021-10-01 (×4): qty 1

## 2021-10-01 MED ORDER — PROPOFOL 500 MG/50ML IV EMUL
INTRAVENOUS | Status: DC | PRN
  Administered 2021-10-01: 25 ug/kg/min via INTRAVENOUS

## 2021-10-01 MED ORDER — ONDANSETRON HCL 4 MG/2ML IJ SOLN: 4.0000 mg | Freq: Once | INTRAMUSCULAR | Status: DC | PRN

## 2021-10-01 MED ORDER — SODIUM CHLORIDE 0.9 % IR SOLN
Status: DC | PRN
  Administered 2021-10-01: 3000 mL

## 2021-10-01 MED ORDER — BUPIVACAINE HCL (PF) 0.75 % IJ SOLN
INTRAMUSCULAR | Status: DC | PRN
  Administered 2021-10-01: 1.4 mL

## 2021-10-01 MED ORDER — POVIDONE-IODINE 10 % EX SWAB: 2.0000 "application " | Freq: Once | CUTANEOUS | Status: DC

## 2021-10-01 MED ORDER — ONDANSETRON HCL 4 MG/2ML IJ SOLN: 4.0000 mg | Freq: Four times a day (QID) | INTRAMUSCULAR | Status: DC | PRN

## 2021-10-01 MED ORDER — ACETAMINOPHEN 325 MG PO TABS: 325.0000 mg | ORAL_TABLET | Freq: Four times a day (QID) | ORAL | Status: DC | PRN

## 2021-10-01 MED ORDER — PROPOFOL 10 MG/ML IV BOLUS
INTRAVENOUS | Status: DC | PRN
  Administered 2021-10-01: 50 mg via INTRAVENOUS

## 2021-10-01 MED ORDER — PHENYLEPHRINE HCL-NACL 20-0.9 MG/250ML-% IV SOLN
INTRAVENOUS | Status: AC
  Filled 2021-10-01: qty 250

## 2021-10-01 MED ORDER — MENTHOL 3 MG MT LOZG: 1.0000 | LOZENGE | OROMUCOSAL | Status: DC | PRN

## 2021-10-01 SURGICAL SUPPLY — 52 items
APL PRP STRL LF DISP 70% ISPRP (MISCELLANEOUS) ×2
BALL HIP ARTICU 28 +5 (Hips) IMPLANT
BIPOLAR PROS AML 44 (Hips) ×2 IMPLANT
BIT DRILL 2.8X128 (BIT) ×3 IMPLANT
BLADE SAGITTAL 25.0X1.27X90 (BLADE) ×3 IMPLANT
CHLORAPREP W/TINT 26 (MISCELLANEOUS) ×4 IMPLANT
CLOTH BEACON ORANGE TIMEOUT ST (SAFETY) ×3 IMPLANT
COVER LIGHT HANDLE STERIS (MISCELLANEOUS) ×6 IMPLANT
DRAPE HIP W/POCKET STRL (MISCELLANEOUS) ×3 IMPLANT
DRAPE U-SHAPE 47X51 STRL (DRAPES) ×3 IMPLANT
DRSG MEPILEX BORDER 4X12 (GAUZE/BANDAGES/DRESSINGS) ×3 IMPLANT
DRSG MEPILEX SACRM 8.7X9.8 (GAUZE/BANDAGES/DRESSINGS) ×3 IMPLANT
ELECT REM PT RETURN 9FT ADLT (ELECTROSURGICAL) ×2
ELECTRODE REM PT RTRN 9FT ADLT (ELECTROSURGICAL) ×2 IMPLANT
GLOVE SS N UNI LF 8.5 STRL (GLOVE) ×3 IMPLANT
GLOVE SURG POLYISO LF SZ8 (GLOVE) ×6 IMPLANT
GLOVE SURG UNDER POLY LF SZ7 (GLOVE) ×9 IMPLANT
GOWN STRL REUS W/TWL LRG LVL3 (GOWN DISPOSABLE) ×6 IMPLANT
GOWN STRL REUS W/TWL XL LVL3 (GOWN DISPOSABLE) ×3 IMPLANT
HANDPIECE INTERPULSE COAX TIP (DISPOSABLE) ×2
HEAD BIPOLAR PROS AML 44 (Hips) IMPLANT
HIP BALL ARTICU 28 +5 (Hips) ×2 IMPLANT
INST SET MAJOR BONE (KITS) ×3 IMPLANT
KIT BLADEGUARD II DBL (SET/KITS/TRAYS/PACK) ×3 IMPLANT
KIT TURNOVER KIT A (KITS) ×3 IMPLANT
MANIFOLD NEPTUNE II (INSTRUMENTS) ×3 IMPLANT
MARKER SKIN DUAL TIP RULER LAB (MISCELLANEOUS) ×3 IMPLANT
NDL HYPO 18GX1.5 BLUNT FILL (NEEDLE) ×2 IMPLANT
NDL HYPO 21X1.5 SAFETY (NEEDLE) ×2 IMPLANT
NEEDLE HYPO 18GX1.5 BLUNT FILL (NEEDLE) ×2 IMPLANT
NEEDLE HYPO 21X1.5 SAFETY (NEEDLE) ×2 IMPLANT
NS IRRIG 1000ML POUR BTL (IV SOLUTION) ×3 IMPLANT
PACK TOTAL JOINT (CUSTOM PROCEDURE TRAY) ×3 IMPLANT
PAD ARMBOARD 7.5X6 YLW CONV (MISCELLANEOUS) ×3 IMPLANT
PIN STMN SNGL STERILE 9X3.6MM (PIN) ×6 IMPLANT
SET BASIN LINEN APH (SET/KITS/TRAYS/PACK) ×3 IMPLANT
SET HNDPC FAN SPRY TIP SCT (DISPOSABLE) ×2 IMPLANT
SPONGE T-LAP 18X18 ~~LOC~~+RFID (SPONGE) ×6 IMPLANT
STAPLER VISISTAT 35W (STAPLE) ×3 IMPLANT
STEM SUMMIT PRESSFIT HIP SZ7 (Hips) ×1 IMPLANT
SUT BRALON NAB BRD #1 30IN (SUTURE) ×6 IMPLANT
SUT ETHIBOND 5 LR DA (SUTURE) ×5 IMPLANT
SUT MNCRL 0 VIOLET CTX 36 (SUTURE) ×2 IMPLANT
SUT MON AB 0 CT1 (SUTURE) ×1 IMPLANT
SUT MON AB 2-0 CT1 36 (SUTURE) ×3 IMPLANT
SUT MONOCRYL 0 CTX 36 (SUTURE) ×2
SUT VIC AB 1 CT1 27 (SUTURE) ×10
SUT VIC AB 1 CT1 27XBRD ANTBC (SUTURE) ×4 IMPLANT
SYR 20ML LL LF (SYRINGE) ×9 IMPLANT
SYR BULB IRRIG 60ML STRL (SYRINGE) ×3 IMPLANT
WATER STERILE IRR 1000ML POUR (IV SOLUTION) ×6 IMPLANT
YANKAUER SUCT 12FT TUBE ARGYLE (SUCTIONS) ×3 IMPLANT

## 2021-10-01 NOTE — Anesthesia Preprocedure Evaluation (Signed)
Anesthesia Evaluation  Patient identified by MRN, date of birth, ID band Patient awake    Reviewed: Allergy & Precautions, H&P , NPO status , Patient's Chart, lab work & pertinent test results, reviewed documented beta blocker date and time   Airway Mallampati: II  TM Distance: >3 FB Neck ROM: full    Dental no notable dental hx.    Pulmonary COPD,  oxygen dependent, former smoker,    Pulmonary exam normal breath sounds clear to auscultation       Cardiovascular Exercise Tolerance: Good hypertension, negative cardio ROS   Rhythm:regular Rate:Normal     Neuro/Psych PSYCHIATRIC DISORDERS Depression Dementia negative neurological ROS     GI/Hepatic negative GI ROS, Neg liver ROS,   Endo/Other  Hypothyroidism   Renal/GU negative Renal ROS  negative genitourinary   Musculoskeletal   Abdominal   Peds  Hematology   Anesthesia Other Findings   Reproductive/Obstetrics negative OB ROS                             Anesthesia Physical Anesthesia Plan  ASA: 3  Anesthesia Plan: Spinal   Post-op Pain Management:    Induction:   PONV Risk Score and Plan: Propofol infusion  Airway Management Planned:   Additional Equipment:   Intra-op Plan:   Post-operative Plan:   Informed Consent: I have reviewed the patients History and Physical, chart, labs and discussed the procedure including the risks, benefits and alternatives for the proposed anesthesia with the patient or authorized representative who has indicated his/her understanding and acceptance.     Dental Advisory Given  Plan Discussed with: CRNA  Anesthesia Plan Comments:         Anesthesia Quick Evaluation

## 2021-10-01 NOTE — Progress Notes (Signed)
Initial Nutrition Assessment  DOCUMENTATION CODES:   Underweight  INTERVENTION:  Ensure Enlive qhs   Assist with tray set up  Minimize room noise during meals  NUTRITION DIAGNOSIS:   Increased nutrient needs related to post-op healing, hip fracture as evidenced by estimated needs.   GOAL:  Patient will meet greater than or equal to 90% of their needs   MONITOR:  Labs, Skin, Weight trends, PO intake, Supplement acceptance  REASON FOR ASSESSMENT:   Consult Assessment of nutrition requirement/status  ASSESSMENT: Patient is an 81 yo underweight female with history of COPD, dementia, CHF, HTN and fall. S/p hemiarthroplasty 1/25.     Patient receiving regular diet meal intake and good appetite - 100% of dinner last night and breakfast this morning. Able to feed herself. She lives at home with husband and son and someone helps prepare meals. No family present.   Patient usual weight around 110 lb (50 kg) between 2017-2019. Currently 88 lb (39.7 kg). Unclear when pt weight began to decrease. She self-reports weight at 110 lb.    Labs: BMP Latest Ref Rng & Units 10/02/2021 09/30/2021 06/05/2020  Glucose 70 - 99 mg/dL 116(H) 149(H) 92  BUN 8 - 23 mg/dL 17 20 16   Creatinine 0.44 - 1.00 mg/dL 0.71 0.69 0.47  Sodium 135 - 145 mmol/L 141 140 145  Potassium 3.5 - 5.1 mmol/L 4.6 3.6 3.9  Chloride 98 - 111 mmol/L 100 96(L) 91(L)  CO2 22 - 32 mmol/L 36(H) 38(H) 47(H)  Calcium 8.9 - 10.3 mg/dL 8.1(L) 8.8(L) 8.7(L)      NUTRITION - FOCUSED PHYSICAL EXAM:  Flowsheet Row Most Recent Value  Orbital Region Mild depletion  Upper Arm Region Moderate depletion  Thoracic and Lumbar Region Mild depletion  Buccal Region Mild depletion  Temple Region Moderate depletion  Clavicle Bone Region Mild depletion  Clavicle and Acromion Bone Region Moderate depletion  Scapular Bone Region Mild depletion  Dorsal Hand Mild depletion  Edema (RD Assessment) None  Hair Reviewed  Eyes Reviewed   [glasses]  Mouth Reviewed  Skin Reviewed  [lower extremity redness]  Nails Reviewed       Diet Order:   Diet Order             Diet regular Room service appropriate? Yes; Fluid consistency: Thin  Diet effective now                   EDUCATION NEEDS:  Not appropriate for education at this time  Skin:  Skin Assessment: Skin Integrity Issues: Skin Integrity Issues:: Incisions Incisions: right hip  Last BM:  1/23  Height:   Ht Readings from Last 1 Encounters:  09/30/21 5' (1.524 m)    Weight:   Wt Readings from Last 1 Encounters:  09/30/21 39.7 kg    Ideal Body Weight:   45 kg  BMI:  Body mass index is 17.09 kg/m.  Estimated Nutritional Needs:   Kcal:  1400-1500  Protein:  60-68 gr  Fluid:  1200 ml daily  Colman Cater MS,RD,CSG,LDN Contact: Shea Evans

## 2021-10-01 NOTE — Brief Op Note (Signed)
10/01/2021  2:56 PM  PATIENT:  [name]  81 y.o. female  PRE-OPERATIVE DIAGNOSIS:  right hip fracture femoral neck  POST-OPERATIVE DIAGNOSIS:  right hip fracture femoral neck  PROCEDURE:  Procedure(s) with comments: ARTHROPLASTY BIPOLAR HIP (HEMIARTHROPLASTY) (Right) - copd on 02  SURGEON:  Surgeon(s) and Role:    Carole Civil, MD - Primary  PHYSICIAN ASSISTANT:   ASSISTANTS: Fulton Mole  ANESTHESIA:   Spinal  EBL: 100 cc  BLOOD ADMINISTERED:none  DRAINS: none   LOCAL MEDICATIONS USED:  OTHER exparel 20 cc  SPECIMEN:  No Specimen  DISPOSITION OF SPECIMEN:  N/A  COUNTS:  YES  TOURNIQUET:  * No tourniquets in log *  DICTATION: .Dragon Dictation  PLAN OF CARE: Admit to inpatient   PATIENT DISPOSITION:  PACU - hemodynamically stable.   Delay start of Pharmacological VTE agent (>24hrs) due to surgical blood loss or risk of bleeding: yes

## 2021-10-01 NOTE — Progress Notes (Signed)
O2 increased to 4lpm from 3lpm due to spo2 87% RT will continue to monitor

## 2021-10-01 NOTE — Interval H&P Note (Signed)
History and Physical Interval Note:  10/01/2021 1:06 PM  [name]  has presented today for surgery, with the diagnosis of right hip fracture femoral neck.  The various methods of treatment have been discussed with the patient and family. After consideration of risks, benefits and other options for treatment, the patient has consented to  Procedure(s) with comments: ARTHROPLASTY BIPOLAR HIP (HEMIARTHROPLASTY) (Right) - copd on 02 as a surgical intervention.  The patient's history has been reviewed, patient examined, no change in status, stable for surgery.  I have reviewed the patient's chart and labs.  Questions were answered to the patient's satisfaction.     Arther Abbott

## 2021-10-01 NOTE — Op Note (Signed)
Hip prosthesis dictation   Pre op dx right femoral neck fracture  Post op dx same  Procedure partial right hip replacement/bipolar  Surgeon [place] basic press-fit 7 stem, 44 head, bipolar.  +5 on the neck length  Indication for surgery displaced right femoral neck fracture  Approach direct lateral   ASSISTANTS: [place] M[place]  ANESTHESIA:   Spinal  BLOOD ADMINISTERED:none  DRAINS: none   LOCAL MEDICATIONS USED:  EXPAREL  SPECIMEN:  No Specimen  DISPOSITION OF SPECIMEN:  N/A  COUNTS:  YES  TOURNIQUET: Not applicable  DICTATION: .Dragon Dictation   The patient was taken to the recovery room in stable condition  PLAN OF CARE: Routine postop care  PATIENT DISPOSITION:  PACU - hemodynamically stable.   Delay start of Pharmacological VTE agent (>24hrs) due to surgical blood loss or risk of bleeding: not applicable  Details of surgery: The patient was identified by 2 approved identification mechanisms. The operative extremity was evaluated and found to be acceptable for surgical treatment today. The chart was reviewed. The surgical site was confirmed and marked.  The patient was taken to the operating room and given appropriate antibiotic 2 g Ancef. This is consistent with the SCIP protocol.  The patient was given the following anesthetic: Spinal  The patient was then placed supine on the operating table. The surgical site was prepped and draped sterilely.  Timeout was executed confirming the patient's name, surgical site, antibiotic administration, x-rays available, and implants were checked and were available.  Incision was made over the greater trochanter extended proximally and distally approximately 3 cm  Subcutaneous tissue was divided down to fascia which was then split in line with the skin incision and deep retractors were placed.  Greater trochanteric bursectomy was performed exposing the abductors  The abductors were split in line with  the muscle fibers dissecting the anterior two thirds from the greater trochanter and subperiosteal dissection.  This was then tagged with 2 Vicryl sutures  The gluteus medius was split in line with this muscle fibers and tagged with two #1 Vicryl sutures as well included the capsule  The femoral head was removed and measured 44 mm  The acetabular was inspected: Looked normal  The hip was dislocated anteriorly into a sterile bag  Proximal femur was prepared starting with a femoral neck cutting guide, box osteotome, starter hole reamer and canal finder.  Trochanteric reamer was then passed.  Broaching was started with a size 1 and broached up to appropriate size based on proximal fit and fill.  Trial reduction was then performed using a 7 press-fit stem, 44 head, 1.5 neck length.  The shuck test was loose so I added 5 to the neck length and did a retrial and reduction  Implant sizes: Size 7 press-fit stem and a 44 head with a +5 neck gave excellent stability including sleep test*  The trial implants were then removed 2 drill holes were placed in the greater trochanter and a #5 Ethibond was passed through the drill holes  Acetabular was irrigated and cleaned of any bony debris implants were placed and the hip was reduced  Marcaine with epinephrine was injected in the soft tissues the abductors were repaired using the #5 Ethibond and then oversewn with #1 Braylon  The hip was then abducted the fascia was closed with #1 Braylon  Subcutaneous tissues were closed with 0 Monocryl and 0 Monocryl and staples 20 cc of exparel  A sterile dressing was applied  The patient  was taken recovery room in stable condition   Postop plan  Weightbearing as tolerated Direct lateral hip precautions DVT prophylaxis for 30 days Remove staples at 12 to 14 days Postop appointment scheduled for 28 days  [id number]

## 2021-10-01 NOTE — H&P (View-Only) (Signed)
[place]   Patient ID: [name], female   DOB: [birth year], 81 y.o.   MRN: [id number]  New patient  Requested by: Dr.DAVID RACHAL  Reason for: Fractured right hip  Based on the information below I recommend partial right hip replacement  Chief Complaint  Patient presents with   Fall     HPI  This is an 81 year old female oxygen dependent status post left femoral nailing for hip fracture in the past fell again injured her right hip presents for evaluation of right hip fracture Location Duration injury date was actually 123 on Monday patient brought to the ER on Tuesday Severity pain is severe Quality dull ache Modified by pain increases with movement relieved by rest  Review of Systems (all) ROS  No chest pain Positive shortness of breath Wears glasses Weight loss malnutrition  Other systems are reviewed and were negative  Past Medical History:  Diagnosis Date   CHF (congestive heart failure) (HCC)    COPD (chronic obstructive pulmonary disease) ([place])    home O2   Diastolic dysfunction    Dyspnea    History of nuclear stress test 09/23/2009   dipyridamole; normal pattern of perfusion; low risk scan    History of tobacco abuse    Hyperlipidemia    Hypertension    Hypothyroidism    Mild depression    Pneumonia     Past Surgical History:  Procedure Laterality Date   BIOPSY  01/13/2017   Procedure: BIOPSY;  Surgeon: Daneil Dolin, MD;  [facility]: [facility];  Service: Endoscopy;;  right/left/sigmoid colon   CATARACT EXTRACTION W/PHACO  09/08/2012   Procedure: CATARACT EXTRACTION PHACO AND INTRAOCULAR LENS PLACEMENT ([place]);  Surgeon: Tonny Branch, MD;  Location: [facility];  Service: Ophthalmology;  Laterality: Right;  CDE:19.67   CATARACT EXTRACTION W/PHACO  09/19/2012   Procedure: CATARACT EXTRACTION PHACO AND INTRAOCULAR LENS PLACEMENT ([facility]);  Surgeon: Tonny Branch, MD;  Location: [facility];  Service: Ophthalmology;  Laterality: Left;   CDE:19.83   COLONOSCOPY  2007   Dr. Gala Romney: internal hemorrhoids, benign polyps   COLONOSCOPY N/A 01/13/2017   Procedure: COLONOSCOPY;  Surgeon: Daneil Dolin, MD;  Location: [facility];  Service: Endoscopy;  Laterality: N/A;  12:15pm   ESOPHAGOGASTRODUODENOSCOPY  2007   Dr. Gala Romney: normal esophagus, non-critical Schatzki's ring not manipulated, normal small bowel biopsy   ESOPHAGOGASTRODUODENOSCOPY (EGD) WITH ESOPHAGEAL DILATION N/A 10/16/2013   Dr. Fields:Schatzki's ring at the gastroesophageal junction s/p dilation/MODERATE Erosive gastritis. reactive gastropathy.    GALLBLADDER SURGERY  1977   INTRAMEDULLARY (IM) NAIL INTERTROCHANTERIC Left 08/03/2018   Procedure: INTRAMEDULLARY (IM) NAIL INTERTROCHANTRIC;  Surgeon: Altamese Horn[place], MD;  Location: [facility];  Service: Orthopedics;  Laterality: Left;   right carpal tunnel release     TONSILLECTOMY     TOTAL ABDOMINAL HYSTERECTOMY  1980's   TRANSTHORACIC ECHOCARDIOGRAM  09/23/2009   EF>55%; trace TR; normal LA & RA size; normal LV & RV size & systolic function     Family History  Problem Relation Age of Onset   Allergies Father    Heart disease Mother    Rheum arthritis Mother    Hypertension Mother    Stroke Maternal Grandmother    Breast cancer Daughter    Breast cancer Sister    Cancer Maternal Aunt    Stroke Sister    Colon cancer Neg Hx    Social History   Tobacco Use   Smoking status: Former    Packs/day: 1.00  Years: 50.00    Pack years: 50.00    Types: Cigarettes    Quit date: 09/07/2008    Years since quitting: 13.0   Smokeless tobacco: Never  Substance Use Topics   Alcohol use: No    Alcohol/week: 0.0 standard drinks   Drug use: No   Allergies  Allergen Reactions   Bee Venom Swelling    SWELLING REACTION UNSPECIFIED     Current Facility-Administered Medications:    0.9 %  sodium chloride infusion, , Intravenous, Continuous, Emokpae, Ejiroghene E, MD, Last Rate: 75 mL/hr at 09/30/21 2101, New Bag at 09/30/21  2101   albuterol (PROVENTIL) (2.5 MG/3ML) 0.083% nebulizer solution 2.5 mg, 2.5 mg, Nebulization, Q4H PRN, Emokpae, Ejiroghene E, MD   ALPRAZolam (XANAX) tablet 0.5 mg, 0.5 mg, Oral, TID PRN, Emokpae, Ejiroghene E, MD   amLODipine (NORVASC) tablet 10 mg, 10 mg, Oral, Daily, Emokpae, Ejiroghene E, MD, 10 mg at 09/30/21 2213   buPROPion (WELLBUTRIN XL) 24 hr tablet 150 mg, 150 mg, Oral, Daily, Emokpae, Ejiroghene E, MD, 150 mg at 09/30/21 2213   influenza vaccine adjuvanted (FLUAD) injection 0.5 mL, 0.5 mL, Intramuscular, Tomorrow-1000, Emokpae, Ejiroghene E, MD   levalbuterol (XOPENEX) nebulizer solution 0.63 mg, 0.63 mg, Nebulization, BID, Emokpae, Ejiroghene E, MD, 0.63 mg at 09/30/21 2144   levothyroxine (SYNTHROID) tablet 50 mcg, 50 mcg, Oral, QAC breakfast, Emokpae, Ejiroghene E, MD   morphine 2 MG/ML injection 2 mg, 2 mg, Intravenous, Q4H PRN, Emokpae, Ejiroghene E, MD, 2 mg at 10/01/21 0502   polyethylene glycol (MIRALAX / GLYCOLAX) packet 17 g, 17 g, Oral, Daily PRN, Emokpae, Ejiroghene E, MD  Facility-Administered Medications Ordered in Other Encounters:    neomycin-polymyxin-dexameth (MAXITROL) 0.1 % ophth ointment, , , PRN, Tonny Branch, MD, 1 application at 11/73/56 1227    Physical Exam(=30) BP 136/71 (BP Location: Left Arm)    Pulse 94    Temp (!) 97.3 F (36.3 C) (Oral)    Resp 19    Ht 5' (1.524 m)    Wt 39.7 kg    SpO2 100%    BMI 17.09 kg/m   Gen. Appearance small thin but not cachectic Peripheral vascular system no edema normal pulses normal color capillary refill normal temperature Lymph nodes ARE NORMAL  Gait unable to ambulate secondary to right hip fracture  Left Upper extremity  Inspection revealed no malalignment or asymmetry  Assessment of range of motion: Full range of motion was recorded  Assessment of stability: Elbow wrist and hand and shoulder were stable  Assessment of muscle strength and tone revealed grade 5 muscle strength and normal muscle tone  Skin  was normal without rash lesion or ulceration  Right lower extremity  Inspection revealed short externally rotated right lower extremity  Assessment of range of motion: Deferred hip fracture  Assessment of stability: Knee ankle reduced Right lower assessment of muscle strength and tone normal muscle tone  Skin was normal without rash lesion or ulceration  Right upper extremity  Inspection revealed no malalignment or asymmetry  Assessment of range of motion: Full range of motion was recorded  Assessment of stability: Ankle, knee and hip were stable  Assessment of muscle strength and tone revealed grade 5 muscle strength and normal muscle tone  Skin was normal without rash lesion or ulceration  Left lower extremity Inspection revealed no malalignment or asymmetry Assessment of range of motion: Full range of motion was recorded Assessment of stability: Ankle, knee and hip were stable Assessment of muscle strength and  tone revealed grade 5 muscle strength and normal muscle tone Skin was normal without rash lesion or ulceration  Coordination was tested by finger-to-nose nose and was normal Deep tendon reflexes were 2+ in the upper extremities and deferred in the lower extremity  examination of sensation by touch was normal  Mental status  Oriented to time person and place normal  Mood and affect normal without depression anxiety or agitation  Dx:   Data Reviewed  ER RECORD REVIEWED: CONFIRMS HISTORY   I reviewed the following images and the reports and my independent interpretation is right femur and pelvis including right hip  The patient has a right femoral neck fracture displaced.  She has a left IM nail   Assessment  81 year old female with COPD chronic oxygen dependent hypothyroidism status post left femoral nailing for prior hip fracture presents with right femoral neck fracture  Plan   Hydration, respiratory treatments,  Bipolar/partial right hip replacement  The  procedure has been fully reviewed with the patient; The risks and benefits of surgery have been discussed and explained and understood. Alternative treatment has also been reviewed, questions were encouraged and answered. The postoperative plan is also been reviewed.    Carole Civil MD

## 2021-10-01 NOTE — Anesthesia Procedure Notes (Addendum)
Date/Time: 10/01/2021 1:20 PM Performed by: Karna Dupes, CRNA Pre-anesthesia Checklist: Patient identified, Emergency Drugs available, Suction available and Patient being monitored Oxygen Delivery Method: Nasal cannula

## 2021-10-01 NOTE — Progress Notes (Signed)
HPI: [name] is a 81 y.o. female with medical history significant for COPD with chronic respiratory failure, dementia, depression, fall, CHF, hypertension. Patient was brought to the ED via EMS with reports of fall.  Patient reports she got up from a sitting position and turned around quickly lost her balance and fell.  She did not hit her head.  Since fall she has had pain to her right lower extremity.  She has not been able to walk.  She reports mild intermittent dizziness chronic and stable for several years.  ED Course: Stable vitals.  Pelvic x-ray shows displaced right femoral neck fracture. EDP talked to Dr. Aline Brochure, he will see in the morning.  Subjective Patient going to the OR today at 1230.  Son at bedside  Physical Exam: Vitals:   09/30/21 2146 10/01/21 0010 10/01/21 0540 10/01/21 0717  BP:  (!) 170/77 136/71   Pulse:  88 94   Resp:  19 19   Temp:  98 F (36.7 C) (!) 97.3 F (36.3 C)   TempSrc:   Oral   SpO2: 99% 97% 100% 99%  Weight:      Height:        Constitutional: NAD, calm, comfortable Vitals:   09/30/21 2146 10/01/21 0010 10/01/21 0540 10/01/21 0717  BP:  (!) 170/77 136/71   Pulse:  88 94   Resp:  19 19   Temp:  98 F (36.7 C) (!) 97.3 F (36.3 C)   TempSrc:   Oral   SpO2: 99% 97% 100% 99%  Weight:      Height:       Eyes: PERRL, lids and conjunctivae normal ENMT: Mucous membranes are moist. .  Neck: normal, supple, no masses, no thyromegaly Respiratory: clear to auscultation bilaterally, no wheezing, no crackles. Normal respiratory effort. No accessory muscle use.  Cardiovascular: Regular rate and rhythm, no murmurs / rubs / gallops. No extremity edema.  Lower extremities warm. Abdomen: no tenderness, no masses palpated. No hepatosplenomegaly. Bowel sounds positive.  Musculoskeletal: no clubbing / cyanosis. No joint deformity upper and lower extremities. Good ROM, no contractures. Normal muscle tone.  Skin: no rashes, lesions, ulcers. No  induration Neurologic: No apparent cranial nerve abnormality, moving all 3 extremities spontaneously- right lower extremity not tested. Psychiatric: Normal judgment and insight. Alert and oriented x 3. Normal mood.   Labs on Admission: I have personally reviewed following labs and imaging studies  CBC: Recent Labs  Lab 09/30/21 1820  WBC 11.2*  NEUTROABS 9.7*  HGB 13.5  HCT 45.3  MCV 97.2  PLT 062   Basic Metabolic Panel: Recent Labs  Lab 09/30/21 1820  NA 140  K 3.6  CL 96*  CO2 38*  GLUCOSE 149*  BUN 20  CREATININE 0.69  CALCIUM 8.8*    Radiological Exams on Admission: DG Pelvis Portable  Result Date: 09/30/2021 CLINICAL DATA:  Pain after fall last night. EXAM: PORTABLE PELVIS 1-2 VIEWS COMPARISON:  Pelvis radiograph 08/01/2018 FINDINGS: Displaced right femoral neck fracture. The femoral head remains seated. The bones are diffusely under mineralized. Intact pubic rami. Pubic symphysis and sacroiliac joints are congruent. Surgical hardware traverses prior intertrochanteric left femur fracture. Prominent vascular calcifications. IMPRESSION: Displaced right femoral neck fracture. Electronically Signed   By: Keith Rake M.D.   On: 09/30/2021 17:45   Chest Portable 1 View  Result Date: 09/30/2021 CLINICAL DATA:  Preoperative evaluation for hip fracture fixation or replacement. EXAM: PORTABLE CHEST 1 VIEW COMPARISON:  Portable chest 06/04/2020. FINDINGS:  The lungs are emphysematous. There are coarse left lower lobe infrahilar markings in the location of the prior dense left lower lobe consolidation. This could indicate post pneumonic scarring or a recurrent streaky infiltrate. The remaining lungs are clear. No pleural effusion is seen. The heart is slightly enlarged with normal caliber central vessels. The aorta is tortuous and calcified. Stable mediastinum. Osteopenia. IMPRESSION: Coarse left infrahilar linear markings in an area of previous dense left lower lobe  consolidation, could represent post pneumonic scarring or a recurrent streaky infiltrate or small aspiration. COPD. Aortic atherosclerosis. Electronically Signed   By: Telford Nab M.D.   On: 09/30/2021 21:34   DG FEMUR PORT, MIN 2 VIEWS RIGHT  Result Date: 09/30/2021 CLINICAL DATA:  Right hip pain after fall last night. EXAM: RIGHT FEMUR PORTABLE 2 VIEW COMPARISON:  None. FINDINGS: Displaced right femoral neck fracture. The femoral head remains seated. Diffuse bony under mineralization. Osteoarthritis of the knee. Soft tissues are atrophic. Vascular calcifications are seen. IMPRESSION: Displaced right femoral neck fracture. Electronically Signed   By: Keith Rake M.D.   On: 09/30/2021 17:44    EKG: Normal sinus rhythm no acute changes  Assessment/Plan Principal Problem:   Closed displaced fracture of right femoral neck (HCC) Active Problems:   COPD [place] D, frequent exacerbations   Hypertension   Depression   Chronic respiratory failure with hypoxia (HCC)   Fall at home, initial encounter   Dementia with behavioral disturbance   Mechanical fall with subsequent closed displaced fracture of the right femoral neck-  - EDP talked to Dr. Aline Brochure 2 OR today at 12:30 PM -N.p.o. midnight - N/s 75cc/hr -IV morphine 2 mg every 4 hours as needed -Preop chest x-ray, EKG  COPD chronic respiratory failure-on 3 L.  Lung exam clear. -Resume home bronchodilator regimen  Hypertension- Elevated -resume Norvasc,  Depression-stable. - Resume Wellbutrin  Dementia-stable. Lives at home with spouse.   DVT prophylaxis: SCDs Code Status: Full code Family Communication: Son at the bedside updated  disposition Plan: > 2 days will likely need rehab Consults called: Orthopedics Admission status: inpt, med surg I certify that at the point of admission it is my clinical judgment that the patient will require inpatient hospital care spanning beyond 2 midnights from the point of admission due to  high intensity of service, high risk for further deterioration and high frequency of surveillance required.    Phillips Grout MD [facility]  10/01/2021, 9:51 AM   Patient ID: [name], female   DOB: 03-11-1941, 81 y.o.   MRN: [id number]

## 2021-10-01 NOTE — Transfer of Care (Signed)
Immediate Anesthesia Transfer of Care Note  Patient: [name]  Procedure(s) Performed: ARTHROPLASTY BIPOLAR HIP (HEMIARTHROPLASTY) (Right: Hip)  Patient Location: PACU  Anesthesia Type:Spinal  Level of Consciousness: awake  Airway & Oxygen Therapy: Patient Spontanous Breathing and Patient connected to nasal cannula oxygen  Post-op Assessment: Report given to RN and Post -op Vital signs reviewed and stable  Post vital signs: Reviewed and stable  Last Vitals:  Vitals Value Taken Time  BP    Temp    Pulse    Resp    SpO2      Last Pain:  Vitals:   10/01/21 1117  TempSrc: Oral  PainSc: 0-No pain      Patients Stated Pain Goal: 0 (34/28/76 8115)  Complications: No notable events documented.

## 2021-10-01 NOTE — Consult Note (Signed)
[place]   Patient ID: [name], female   DOB: October 27, 1940, 81 y.o.   MRN: [id number]  New patient  Requested by: Dr.DAVID RACHAL  Reason for: Fractured right hip  Based on the information below I recommend partial right hip replacement  Chief Complaint  Patient presents with   Fall     HPI  This is an 81 year old female oxygen dependent status post left femoral nailing for hip fracture in the past fell again injured her right hip presents for evaluation of right hip fracture Location Duration injury date was actually 123 on Monday patient brought to the ER on Tuesday Severity pain is severe Quality dull ache Modified by pain increases with movement relieved by rest  Review of Systems (all) ROS  No chest pain Positive shortness of breath Wears glasses Weight loss malnutrition  Other systems are reviewed and were negative  Past Medical History:  Diagnosis Date   CHF (congestive heart failure) (HCC)    COPD (chronic obstructive pulmonary disease) ([place])    home O2   Diastolic dysfunction    Dyspnea    History of nuclear stress test 09/23/2009   dipyridamole; normal pattern of perfusion; low risk scan    History of tobacco abuse    Hyperlipidemia    Hypertension    Hypothyroidism    Mild depression    Pneumonia     Past Surgical History:  Procedure Laterality Date   BIOPSY  01/13/2017   Procedure: BIOPSY;  Surgeon: Daneil Dolin, MD;  [facility];  Service: Endoscopy;;  right/left/sigmoid colon   CATARACT EXTRACTION W/PHACO  09/08/2012   Procedure: CATARACT EXTRACTION PHACO AND INTRAOCULAR LENS PLACEMENT ([place]);  Surgeon: Tonny Branch, MD;  Location: [facility];  Service: Ophthalmology;  Laterality: Right;  CDE:19.67   CATARACT EXTRACTION W/PHACO  09/19/2012   Procedure: CATARACT EXTRACTION PHACO AND INTRAOCULAR LENS PLACEMENT (I[facility]);  Surgeon: Tonny Branch, MD;  Location: [facility];  Service: Ophthalmology;  Laterality: Left;   CDE:19.83   COLONOSCOPY  2007   Dr. Gala Romney: internal hemorrhoids, benign polyps   COLONOSCOPY N/A 01/13/2017   Procedure: COLONOSCOPY;  Surgeon: Daneil Dolin, MD;  [facility]: [facility];  Service: Endoscopy;  Laterality: N/A;  12:15pm   ESOPHAGOGASTRODUODENOSCOPY  2007   Dr. Gala Romney: normal esophagus, non-critical Schatzki's ring not manipulated, normal small bowel biopsy   ESOPHAGOGASTRODUODENOSCOPY (EGD) WITH ESOPHAGEAL DILATION N/A 10/16/2013   Dr. Fields:Schatzki's ring at the gastroesophageal junction s/p dilation/MODERATE Erosive gastritis. reactive gastropathy.    GALLBLADDER SURGERY  1977   INTRAMEDULLARY (IM) NAIL INTERTROCHANTERIC Left 08/03/2018   Procedure: INTRAMEDULLARY (IM) NAIL INTERTROCHANTRIC;  Surgeon: Altamese Urich, MD;  Location: [place];  Service: Orthopedics;  Laterality: Left;   right carpal tunnel release     TONSILLECTOMY     TOTAL ABDOMINAL HYSTERECTOMY  1980's   TRANSTHORACIC ECHOCARDIOGRAM  09/23/2009   EF>55%; trace TR; normal LA & RA size; normal LV & RV size & systolic function     Family History  Problem Relation Age of Onset   Allergies Father    Heart disease Mother    Rheum arthritis Mother    Hypertension Mother    Stroke Maternal Grandmother    Breast cancer Daughter    Breast cancer Sister    Cancer Maternal Aunt    Stroke Sister    Colon cancer Neg Hx    Social History   Tobacco Use   Smoking status: Former    Packs/day: 1.00  Years: 50.00    Pack years: 50.00    Types: Cigarettes    Quit date: 09/07/2008    Years since quitting: 13.0   Smokeless tobacco: Never  Substance Use Topics   Alcohol use: No    Alcohol/week: 0.0 standard drinks   Drug use: No   Allergies  Allergen Reactions   Bee Venom Swelling    SWELLING REACTION UNSPECIFIED     Current Facility-Administered Medications:    0.9 %  sodium chloride infusion, , Intravenous, Continuous, Emokpae, Ejiroghene E, MD, Last Rate: 75 mL/hr at 09/30/21 2101, New Bag at 09/30/21  2101   albuterol (PROVENTIL) (2.5 MG/3ML) 0.083% nebulizer solution 2.5 mg, 2.5 mg, Nebulization, Q4H PRN, Emokpae, Ejiroghene E, MD   ALPRAZolam (XANAX) tablet 0.5 mg, 0.5 mg, Oral, TID PRN, Emokpae, Ejiroghene E, MD   amLODipine (NORVASC) tablet 10 mg, 10 mg, Oral, Daily, Emokpae, Ejiroghene E, MD, 10 mg at 09/30/21 2213   buPROPion (WELLBUTRIN XL) 24 hr tablet 150 mg, 150 mg, Oral, Daily, Emokpae, Ejiroghene E, MD, 150 mg at 09/30/21 2213   influenza vaccine adjuvanted (FLUAD) injection 0.5 mL, 0.5 mL, Intramuscular, Tomorrow-1000, Emokpae, Ejiroghene E, MD   levalbuterol (XOPENEX) nebulizer solution 0.63 mg, 0.63 mg, Nebulization, BID, Emokpae, Ejiroghene E, MD, 0.63 mg at 09/30/21 2144   levothyroxine (SYNTHROID) tablet 50 mcg, 50 mcg, Oral, QAC breakfast, Emokpae, Ejiroghene E, MD   morphine 2 MG/ML injection 2 mg, 2 mg, Intravenous, Q4H PRN, Emokpae, Ejiroghene E, MD, 2 mg at 10/01/21 0502   polyethylene glycol (MIRALAX / GLYCOLAX) packet 17 g, 17 g, Oral, Daily PRN, Emokpae, Ejiroghene E, MD  Facility-Administered Medications Ordered in Other Encounters:    neomycin-polymyxin-dexameth (MAXITROL) 0.1 % ophth ointment, , , PRN, Tonny Branch, MD, 1 application at 75/64/33 1227    Physical Exam(=30) BP 136/71 (BP Location: Left Arm)    Pulse 94    Temp (!) 97.3 F (36.3 C) (Oral)    Resp 19    Ht 5' (1.524 m)    Wt 39.7 kg    SpO2 100%    BMI 17.09 kg/m   Gen. Appearance small thin but not cachectic Peripheral vascular system no edema normal pulses normal color capillary refill normal temperature Lymph nodes ARE NORMAL  Gait unable to ambulate secondary to right hip fracture  Left Upper extremity  Inspection revealed no malalignment or asymmetry  Assessment of range of motion: Full range of motion was recorded  Assessment of stability: Elbow wrist and hand and shoulder were stable  Assessment of muscle strength and tone revealed grade 5 muscle strength and normal muscle tone  Skin  was normal without rash lesion or ulceration  Right lower extremity  Inspection revealed short externally rotated right lower extremity  Assessment of range of motion: Deferred hip fracture  Assessment of stability: Knee ankle reduced Right lower assessment of muscle strength and tone normal muscle tone  Skin was normal without rash lesion or ulceration  Right upper extremity  Inspection revealed no malalignment or asymmetry  Assessment of range of motion: Full range of motion was recorded  Assessment of stability: Ankle, knee and hip were stable  Assessment of muscle strength and tone revealed grade 5 muscle strength and normal muscle tone  Skin was normal without rash lesion or ulceration  Left lower extremity Inspection revealed no malalignment or asymmetry Assessment of range of motion: Full range of motion was recorded Assessment of stability: Ankle, knee and hip were stable Assessment of muscle strength and  tone revealed grade 5 muscle strength and normal muscle tone Skin was normal without rash lesion or ulceration  Coordination was tested by finger-to-nose nose and was normal Deep tendon reflexes were 2+ in the upper extremities and deferred in the lower extremity  examination of sensation by touch was normal  Mental status  Oriented to time person and place normal  Mood and affect normal without depression anxiety or agitation  Dx:   Data Reviewed  ER RECORD REVIEWED: CONFIRMS HISTORY   I reviewed the following images and the reports and my independent interpretation is right femur and pelvis including right hip  The patient has a right femoral neck fracture displaced.  She has a left IM nail   Assessment  81 year old female with COPD chronic oxygen dependent hypothyroidism status post left femoral nailing for prior hip fracture presents with right femoral neck fracture  Plan   Hydration, respiratory treatments,  Bipolar/partial right hip replacement  The  procedure has been fully reviewed with the patient; The risks and benefits of surgery have been discussed and explained and understood. Alternative treatment has also been reviewed, questions were encouraged and answered. The postoperative plan is also been reviewed.    Carole Civil MD

## 2021-10-01 NOTE — Anesthesia Procedure Notes (Signed)
Spinal  Patient location during procedure: OR Start time: 10/01/2021 1:15 PM End time: 10/01/2021 1:20 PM Staffing Performed: resident/CRNA  Resident/CRNA: Karna Dupes, CRNA Preanesthetic Checklist Completed: patient identified, IV checked, site marked, risks and benefits discussed, surgical consent, monitors and equipment checked, pre-op evaluation and timeout performed Spinal Block Patient position: right lateral decubitus Prep: ChloraPrep and site prepped and draped Patient monitoring: heart rate, cardiac monitor, continuous pulse ox and blood pressure Approach: midline Location: L3-4 Needle Needle type: Pencan  Needle gauge: 24 G Assessment Events: CSF return

## 2021-10-01 NOTE — Progress Notes (Signed)
Patient's post op neurovascular checks wnl,vss, ice pack in place to right hip and patient without complaint.

## 2021-10-01 NOTE — Progress Notes (Signed)
Patient appeared alert and oriented when she arrived to the floor but as the  night progressed she became more confused. She was able to tell me that she lived with her husband and son but she feels this admission is a mistake and that she is not supposed to be here.

## 2021-10-02 ENCOUNTER — Encounter (HOSPITAL_COMMUNITY): Payer: Self-pay | Admitting: Orthopedic Surgery

## 2021-10-02 LAB — BASIC METABOLIC PANEL
Anion gap: 5 (ref 5–15)
BUN: 17 mg/dL (ref 8–23)
CO2: 36 mmol/L — ABNORMAL HIGH (ref 22–32)
Calcium: 8.1 mg/dL — ABNORMAL LOW (ref 8.9–10.3)
Chloride: 100 mmol/L (ref 98–111)
Creatinine, Ser: 0.71 mg/dL (ref 0.44–1.00)
GFR, Estimated: >60 mL/min (ref >60)
Glucose, Bld: 116 mg/dL — ABNORMAL HIGH (ref 70–99)
Potassium: 4.6 mmol/L (ref 3.5–5.1)
Sodium: 141 mmol/L (ref 135–145)

## 2021-10-02 LAB — CBC
HCT: 36.3 % (ref 36.0–46.0)
Hemoglobin: 10.5 g/dL — ABNORMAL LOW (ref 12.0–15.0)
MCH: 29 pg (ref 26.0–34.0)
MCHC: 28.9 g/dL — ABNORMAL LOW (ref 30.0–36.0)
MCV: 100.3 fL — ABNORMAL HIGH (ref 80.0–100.0)
Platelets: 180 10*3/uL (ref 150–400)
RBC: 3.62 MIL/uL — ABNORMAL LOW (ref 3.87–5.11)
RDW: 13 % (ref 11.5–15.5)
WBC: 9.9 10*3/uL (ref 4.0–10.5)
nRBC: 0 % (ref 0.0–0.2)

## 2021-10-02 MED ORDER — ENSURE ENLIVE PO LIQD
237.0000 mL | Freq: Two times a day (BID) | ORAL | Status: DC
  Administered 2021-10-03: 237 mL via ORAL

## 2021-10-02 NOTE — NC FL2 (Signed)
[place] LEVEL OF CARE SCREENING TOOL     IDENTIFICATION  Patient Name: [name] Birthdate: [birth year] Sex: female Admission Date (Current Location): 09/30/2021  [facility] and [place] Number:  [employer] and Address:  [place] [address], [place]      Provider Number: [id number]  Attending Physician Name and Address:  Phillips Grout, MD  Relative Name and Phone Number:       Current Level of Care: Hospital Recommended Level of Care: [place] Prior Approval Number:    Date Approved/Denied:   PASRR Number: [id number]  Discharge Plan: SNF    Current Diagnoses: Patient Active Problem List   Diagnosis Date Noted   Closed displaced fracture of right femoral neck ([place]) 09/30/2021   Pressure injury of skin 06/09/2020   CAP (community acquired pneumonia) 06/04/2020   Dementia with behavioral disturbance 06/04/2020   Acute blood loss anemia 08/04/2018   Malnutrition of moderate degree 08/02/2018   Closed left hip fracture, initial encounter ([place]) 02/54/2706   Diastolic dysfunction 23/76/2831   Closed sacral fracture/S2-S3 02/27/2018   Leg weakness, bilateral 02/26/2018   Fall at home, initial encounter 02/26/2018   Oral thrush 02/26/2017   Rectal bleeding 10/21/2016   Abdominal pain 06/06/2015   Dysphagia    Chronic respiratory failure with hypoxia ([place]) 02/02/2014   Alopecia 02/02/2014   Loss of weight 01/24/2014   Unspecified constipation 01/24/2014   Esophageal dysphagia 01/24/2014   COPD Golds D, frequent exacerbations    Hypertension    Hyperlipidemia    Hypothyroidism    Depression     Orientation RESPIRATION BLADDER Height & Weight        O2 (3L) Continent, External catheter Weight: 87 lb 8.4 oz (39.7 kg) Height:  5' (152.4 cm)  BEHAVIORAL SYMPTOMS/MOOD NEUROLOGICAL BOWEL NUTRITION STATUS      Continent Diet (See D/C summary)  AMBULATORY STATUS COMMUNICATION OF NEEDS Skin    Extensive Assist Verbally Surgical wounds (Closed hip surgical incision)                       Personal Care Assistance Level of Assistance  Bathing, Feeding, Dressing, Total care Bathing Assistance: Limited assistance Feeding assistance: Independent Dressing Assistance: Limited assistance Total Care Assistance: Limited assistance   Functional Limitations Info  Sight, Speech, Hearing Sight Info: Adequate Hearing Info: Adequate Speech Info: Adequate    SPECIAL CARE FACTORS FREQUENCY  PT (By licensed PT), OT (By licensed OT)     PT Frequency: 5 times weekly OT Frequency: 5 times weekly            Contractures Contractures Info: Not present    Additional Factors Info  Code Status, Allergies Code Status Info: FULL Allergies Info: Bee Venom           Current Medications (10/02/2021):  This is the current hospital active medication list Current Facility-Administered Medications  Medication Dose Route Frequency Provider Last Rate Last Admin   acetaminophen (TYLENOL) tablet 325-650 mg  325-650 mg Oral Q6H PRN Carole Civil, MD       albuterol (PROVENTIL) (2.5 MG/3ML) 0.083% nebulizer solution 2.5 mg  2.5 mg Nebulization Q4H PRN Carole Civil, MD       ALPRAZolam Duanne Moron) tablet 0.5 mg  0.5 mg Oral TID PRN Carole Civil, MD       alum & mag hydroxide-simeth (MAALOX/MYLANTA) 200-200-20 MG/5ML suspension 30 mL  30 mL Oral Q4H PRN  Carole Civil, MD       amLODipine (NORVASC) tablet 10 mg  10 mg Oral Daily Carole Civil, MD   10 mg at 10/02/21 0825   aspirin EC tablet 325 mg  325 mg Oral Q breakfast Carole Civil, MD   325 mg at 10/02/21 0825   bisacodyl (DULCOLAX) EC tablet 5 mg  5 mg Oral Daily PRN Carole Civil, MD       buPROPion (WELLBUTRIN XL) 24 hr tablet 150 mg  150 mg Oral Daily Carole Civil, MD   150 mg at 10/02/21 0825   Chlorhexidine Gluconate Cloth 2 % PADS 6 each  6 each Topical Daily Carole Civil, MD   6  each at 10/02/21 0826   docusate sodium (COLACE) capsule 100 mg  100 mg Oral BID Carole Civil, MD   100 mg at 10/02/21 0825   HYDROcodone-acetaminophen (NORCO/VICODIN) 5-325 MG per tablet 1-2 tablet  1-2 tablet Oral Q4H PRN Carole Civil, MD   2 tablet at 10/02/21 0825   levalbuterol (XOPENEX) nebulizer solution 0.63 mg  0.63 mg Nebulization BID Carole Civil, MD   0.63 mg at 10/02/21 0855   levothyroxine (SYNTHROID) tablet 50 mcg  50 mcg Oral QAC breakfast Carole Civil, MD   50 mcg at 10/02/21 0553   menthol-cetylpyridinium (CEPACOL) lozenge 3 mg  1 lozenge Oral PRN Carole Civil, MD       Or   phenol (CHLORASEPTIC) mouth spray 1 spray  1 spray Mouth/Throat PRN Carole Civil, MD       methocarbamol (ROBAXIN) tablet 500 mg  500 mg Oral Q6H PRN Carole Civil, MD   500 mg at 10/01/21 2025   Or   methocarbamol (ROBAXIN) 500 mg in dextrose 5 % 50 mL IVPB  500 mg Intravenous Q6H PRN Carole Civil, MD       metoCLOPramide (REGLAN) tablet 5-10 mg  5-10 mg Oral Q8H PRN Carole Civil, MD       Or   metoCLOPramide (REGLAN) injection 5-10 mg  5-10 mg Intravenous Q8H PRN Carole Civil, MD       morphine 2 MG/ML injection 0.5-1 mg  0.5-1 mg Intravenous Q2H PRN Carole Civil, MD       ondansetron [facility]) tablet 4 mg  4 mg Oral Q6H PRN Carole Civil, MD       Or   ondansetron [facility]) injection 4 mg  4 mg Intravenous Q6H PRN Carole Civil, MD       pantoprazole (PROTONIX) EC tablet 40 mg  40 mg Oral Daily Carole Civil, MD   40 mg at 10/02/21 0825   polyethylene glycol (MIRALAX / GLYCOLAX) packet 17 g  17 g Oral Daily PRN Carole Civil, MD       senna-docusate (Senokot-S) tablet 1 tablet  1 tablet Oral QHS PRN Carole Civil, MD       traMADol Veatrice Bourbon) tablet 50 mg  50 mg Oral Q6H Carole Civil, MD   50 mg at 10/02/21 1115   Facility-Administered Medications Ordered in Other Encounters  Medication Dose  Route Frequency Provider Last Rate Last Admin   neomycin-polymyxin-dexameth (MAXITROL) 0.1 % ophth ointment    PRN Tonny Branch, MD   1 application at 94/49/67 1227     Discharge Medications: Please see discharge summary for a list of discharge medications.  Relevant Imaging Results:  Relevant Lab Results:   Additional Information  SSN: [id number]. Per family patient is fully vaccinated for COVID and spouse has vaccination card.  Iona Beard, LCSWA

## 2021-10-02 NOTE — Anesthesia Postprocedure Evaluation (Signed)
Anesthesia Post Note  Patient: DANEYA HARTGROVE  Procedure(s) Performed: ARTHROPLASTY BIPOLAR HIP (HEMIARTHROPLASTY) (Right: Hip)  Patient location during evaluation: Phase II Anesthesia Type: Spinal Level of consciousness: awake Pain management: pain level controlled Vital Signs Assessment: post-procedure vital signs reviewed and stable Respiratory status: spontaneous breathing and respiratory function stable Cardiovascular status: blood pressure returned to baseline and stable Postop Assessment: no headache and no apparent nausea or vomiting Anesthetic complications: no Comments: Late entry   No notable events documented.   Last Vitals:  Vitals:   10/01/21 2036 10/02/21 0605  BP: (!) 124/57 115/69  Pulse: 93 90  Resp: 18 18  Temp: 36.6 C 37 C  SpO2: 99% 99%    Last Pain:  Vitals:   10/01/21 2219  TempSrc:   PainSc: 0-No pain                 Louann Sjogren

## 2021-10-02 NOTE — Progress Notes (Signed)
Patient is now s/p op #1 Right hip, dressing C/D/I. Patient rested well overnight with some noted confusion easy to redirect however patient's son Rodman Key stayed overnight at bedside. Foley removed per orders.

## 2021-10-02 NOTE — Plan of Care (Signed)
°  Problem: Acute Rehab PT Goals(only PT should resolve) Goal: Pt Will Go Supine/Side To Sit Outcome: Progressing Flowsheets (Taken 10/02/2021 1423) Pt will go Supine/Side to Sit:  with minimal assist  with moderate assist Goal: Patient Will Transfer Sit To/From Stand Outcome: Progressing Flowsheets (Taken 10/02/2021 1423) Patient will transfer sit to/from stand:  with minimal assist  with moderate assist Goal: Pt Will Transfer Bed To Chair/Chair To Bed Outcome: Progressing Flowsheets (Taken 10/02/2021 1423) Pt will Transfer Bed to Chair/Chair to Bed:  with min assist  with mod assist Goal: Pt Will Ambulate Outcome: Progressing Flowsheets (Taken 10/02/2021 1423) Pt will Ambulate:  25 feet  with minimal assist  with moderate assist  with rolling walker   2:24 PM, 10/02/21 Lonell Grandchild, MPT Physical Therapist with [facility] [phone] office [phone] mobile phone

## 2021-10-02 NOTE — Evaluation (Signed)
Physical Therapy Evaluation Patient Details Name: [name] MRN: [id number] DOB: [birth year] Today's Date: 10/02/2021  History of Present Illness  [name] is a 81 y.o. female with medical history significant for COPD with chronic respiratory failure, dementia, depression, fall, CHF, hypertension.  Patient was brought to the ED via EMS with reports of fall.  Patient reports she got up from a sitting position and turned around quickly lost her balance and fell.  She did not hit her head.  Since fall she has had pain to her right lower extremity.  She has not been able to walk.  She reports mild intermittent dizziness chronic and stable for several years.   Clinical Impression  Patient demonstrates slow labored movement for sitting up at bedside with c/o increasing right hip pain, very unsteady on feet and limited to a few slow labored steps with increased pain right hip before having to sit.  Patient tolerated sitting up in chair after therapy - nursing staff notified.  Patient will benefit from continued skilled physical therapy in hospital and recommended venue below to increase strength, balance, endurance for safe ADLs and gait.          Recommendations for follow up therapy are one component of a multi-disciplinary discharge planning process, led by the attending physician.  Recommendations may be updated based on patient status, additional functional criteria and insurance authorization.  Follow Up Recommendations Skilled nursing-short term rehab (<3 hours/day)    Assistance Recommended at Discharge Intermittent Supervision/Assistance  Patient can return home with the following  A lot of help with bathing/dressing/bathroom;A lot of help with walking and/or transfers;Help with stairs or ramp for entrance;Assistance with cooking/housework    Equipment Recommendations None recommended by PT  Recommendations for Other Services       Functional Status Assessment Patient has had a  recent decline in their functional status and demonstrates the ability to make significant improvements in function in a reasonable and predictable amount of time.     Precautions / Restrictions Precautions Precautions: Fall Precaution Comments: Home supplemental O2 dependent Restrictions Weight Bearing Restrictions: Yes RLE Weight Bearing: Weight bearing as tolerated      Mobility  Bed Mobility Overal bed mobility: Needs Assistance Bed Mobility: Supine to Sit     Supine to sit: Mod assist, Max assist     General bed mobility comments: increased time, labored movement    Transfers Overall transfer level: Needs assistance Equipment used: Rolling walker (2 wheels) Transfers: Sit to/from Stand, Bed to chair/wheelchair/BSC Sit to Stand: Mod assist   Step pivot transfers: Mod assist       General transfer comment: slow labored movement    Ambulation/Gait Ambulation/Gait assistance: Mod assist, Max assist Gait Distance (Feet): 6 Feet Assistive device: Rolling walker (2 wheels) Gait Pattern/deviations: Decreased step length - right, Decreased step length - left, Decreased stride length, Antalgic Gait velocity: decreased     General Gait Details: limited to a few slow labored unsteady steps with increased pain when weightbearing on RLE  Stairs            Wheelchair Mobility    Modified Rankin (Stroke Patients Only)       Balance Overall balance assessment: Needs assistance Sitting-balance support: Feet supported, No upper extremity supported Sitting balance-Leahy Scale: Fair Sitting balance - Comments: fair/good seated at EOB   Standing balance support: Reliant on assistive device for balance, During functional activity, Bilateral upper extremity supported Standing balance-Leahy Scale: Poor Standing balance comment: fair/poor using  RW                             Pertinent Vitals/Pain Pain Assessment Pain Assessment: Faces Faces Pain  Scale: Hurts even more Pain Location: right hip with movement Pain Descriptors / Indicators: Sore, Grimacing, Guarding Pain Intervention(s): Limited activity within patient's tolerance, Monitored during session, Premedicated before session, Repositioned    Home Living Family/patient expects to be discharged to:: Private residence Living Arrangements: Spouse/significant other;Children Available Help at Discharge: Family;Available 24 hours/day Type of Home: House Home Access: Stairs to enter Entrance Stairs-Rails: Left Entrance Stairs-Number of Steps: 6 Alternate Level Stairs-Number of Steps: 12 Home Layout: Two level;Able to live on main level with bedroom/bathroom;Full bath on main level Home Equipment: Rollator (4 wheels);Rolling Walker (2 wheels);Wheelchair - manual;Cane - quad;BSC/3in1;Grab bars - tub/shower      Prior Function Prior Level of Function : Independent/Modified Independent             Mobility Comments: community ambulator without AD, drives, Home O2 dependent 3 LPM "per patient" ADLs Comments: Independent     Hand Dominance        Extremity/Trunk Assessment   Upper Extremity Assessment Upper Extremity Assessment: Generalized weakness    Lower Extremity Assessment Lower Extremity Assessment: Generalized weakness;RLE deficits/detail RLE Deficits / Details: grossly 3+/5 RLE: Unable to fully assess due to pain RLE Sensation: WNL RLE Coordination: WNL    Cervical / Trunk Assessment Cervical / Trunk Assessment: Normal  Communication   Communication: No difficulties  Cognition Arousal/Alertness: Awake/alert Behavior During Therapy: WFL for tasks assessed/performed Overall Cognitive Status: Within Functional Limits for tasks assessed                                          General Comments      Exercises     Assessment/Plan    PT Assessment Patient needs continued PT services  PT Problem List Decreased strength;Decreased  activity tolerance;Decreased balance;Decreased mobility       PT Treatment Interventions DME instruction;Gait training;Stair training;Functional mobility training;Therapeutic activities;Therapeutic exercise;Patient/family education;Balance training    PT Goals (Current goals can be found in the Care Plan section)  Acute Rehab PT Goals Patient Stated Goal: return home after rehab PT Goal Formulation: With patient Time For Goal Achievement: 10/16/21 Potential to Achieve Goals: Good    Frequency Min 4X/week     Co-evaluation               AM-PAC PT "6 Clicks" Mobility  Outcome Measure Help needed turning from your back to your side while in a flat bed without using bedrails?: A Lot Help needed moving from lying on your back to sitting on the side of a flat bed without using bedrails?: A Lot Help needed moving to and from a bed to a chair (including a wheelchair)?: A Lot Help needed standing up from a chair using your arms (e.g., wheelchair or bedside chair)?: A Lot Help needed to walk in hospital room?: A Lot Help needed climbing 3-5 steps with a railing? : Total 6 Click Score: 11    End of Session Equipment Utilized During Treatment: Oxygen Activity Tolerance: Patient tolerated treatment well;Patient limited by fatigue;Patient limited by pain Patient left: in chair;with call bell/phone within reach Nurse Communication: Mobility status PT Visit Diagnosis: Unsteadiness on feet (R26.81);Other abnormalities of gait and mobility (R26.89);Muscle  weakness (generalized) (M62.81)    Time: 0104-0459 PT Time Calculation (min) (ACUTE ONLY): 29 min   Charges:   PT Evaluation $PT Eval Moderate Complexity: 1 Mod PT Treatments $Therapeutic Activity: 23-37 mins        2:23 PM, 10/02/21 Lonell Grandchild, MPT Physical Therapist with [facility] [phone] office [phone] mobile phone

## 2021-10-02 NOTE — Progress Notes (Signed)
Patient out of bed to chair today. Patient's foley out at 6am , bladder scanned this evening with 200cc noted, patient comfortable, MD and oncoming nurse aware.

## 2021-10-02 NOTE — TOC Initial Note (Signed)
Transition of Care [facility]) - Initial/Assessment Note    Patient Details  Name: [name] MRN: [id number] Date of Birth: [birth year]  [facility]) CM/SW Contact:    Iona Beard, [place] Phone Number: 10/02/2021, 11:18 AM  Clinical Narrative:                 [facility] consulted for SNF placement. CSW spoke with pt to complete assessment. Pt states that she lives with her husband and son. Pt states at times she needs assistance with ADLs. Pt states that her family provides transportation when needed. Pt has had HH in the past. Pt states that she has a wheelchair and walker. CSW spoke with pt about PT recommending SNF at D/C. Pt states that she will go but would like CSW to speak with family about it. CSW has left VM for both pts son and daughter listed in the chart requesting callback. CSW spoke with pts son and he states they would like for pt to go to SNF. Family would prefer for family to go to SNF at [facility]. Pts referral sent out for facility review. TOC to follow.   Expected Discharge Plan: Skilled Nursing Facility Barriers to Discharge: Continued Medical Work up   Patient Goals and CMS Choice Patient states their goals for this hospitalization and ongoing recovery are:: Go to SNF CMS Medicare.gov Compare Post Acute Care list provided to:: Patient Choice offered to / list presented to : Patient, Adult Children  Expected Discharge Plan and Services Expected Discharge Plan: [place] In-house Referral: Clinical Social Work Discharge Planning Services: CM Consult Post Acute Care Choice: [place] Living arrangements for the past 2 months: [place]                                      Prior Living Arrangements/Services Living arrangements for the past 2 months: Single Family Home Lives with:: Self, Spouse, Adult Children Patient language and need for interpreter reviewed:: Yes Do you feel safe going back to the place  where you live?: Yes      Need for Family Participation in Patient Care: Yes (Comment) Care giver support system in place?: Yes (comment) Current home services: DME Criminal Activity/Legal Involvement Pertinent to Current Situation/Hospitalization: No - Comment as needed  Activities of Daily Living Home Assistive Devices/Equipment: None ADL Screening (condition at time of admission) Patient's cognitive ability adequate to safely complete daily activities?: Yes Is the patient deaf or have difficulty hearing?: No Does the patient have difficulty seeing, even when wearing glasses/contacts?: No Does the patient have difficulty concentrating, remembering, or making decisions?: No Patient able to express need for assistance with ADLs?: Yes Does the patient have difficulty dressing or bathing?: Yes Independently performs ADLs?: No Communication: Independent Dressing (OT): Needs assistance Is this a change from baseline?: Change from baseline, expected to last >3 days Grooming: Independent Feeding: Independent Bathing: Needs assistance Is this a change from baseline?: Change from baseline, expected to last >3 days Toileting: Needs assistance Is this a change from baseline?: Change from baseline, expected to last >3days In/Out Bed: Needs assistance Is this a change from baseline?: Change from baseline, expected to last >3 days Walks in Home: Needs assistance Is this a change from baseline?: Change from baseline, expected to last >3 days Does the patient have difficulty walking or climbing stairs?: Yes Weakness of Legs: Both Weakness of  Arms/Hands: None  Permission Sought/Granted                  Emotional Assessment Appearance:: Appears stated age Attitude/Demeanor/Rapport: Engaged Affect (typically observed): Accepting   Alcohol / Substance Use: Not Applicable Psych Involvement: No (comment)  Admission diagnosis:  Fall [W19.XXXA] Closed fracture of right hip, initial  encounter ([place]) [S72.001A] Closed displaced fracture of right femoral neck ([place]) [S72.001A] Patient Active Problem List   Diagnosis Date Noted   Closed displaced fracture of right femoral neck ([place]) 09/30/2021   Pressure injury of skin 06/09/2020   CAP (community acquired pneumonia) 06/04/2020   Dementia with behavioral disturbance 06/04/2020   Acute blood loss anemia 08/04/2018   Malnutrition of moderate degree 08/02/2018   Closed left hip fracture, initial encounter ([place]) 16/06/9603   Diastolic dysfunction 54/05/8118   Closed sacral fracture/S2-S3 02/27/2018   Leg weakness, bilateral 02/26/2018   Fall at home, initial encounter 02/26/2018   Oral thrush 02/26/2017   Rectal bleeding 10/21/2016   Abdominal pain 06/06/2015   Dysphagia    Chronic respiratory failure with hypoxia ([place]) 02/02/2014   Alopecia 02/02/2014   Loss of weight 01/24/2014   Unspecified constipation 01/24/2014   Esophageal dysphagia 01/24/2014   COPD Golds D, frequent exacerbations    Hypertension    Hyperlipidemia    Hypothyroidism    Depression    PCP:  [name], MD Pharmacy:   [facility] ORDER) [place] [place] [address] Phone: [phone] Fax: [place], [place] [address] Phone: [phone] Fax: [phone]     Social Determinants of Health ([place]) Interventions    Readmission Risk Interventions Readmission Risk Prevention Plan 10/02/2021  Medication Screening Complete  Transportation Screening Complete  Some recent data might be hidden

## 2021-10-02 NOTE — Progress Notes (Signed)
Patient ID: [name], female   DOB: 1941/01/19, 81 y.o.   MRN: [id number]   HPI: [name] is a 81 y.o. female with medical history significant for COPD with chronic respiratory failure, dementia, depression, fall, CHF, hypertension. Patient was brought to the ED via EMS with reports of fall.  Patient reports she got up from a sitting position and turned around quickly lost her balance and fell.  She did not hit her head.  Since fall she has had pain to her right lower extremity.  She has not been able to walk.  She reports mild intermittent dizziness chronic and stable for several years.  ED Course: Stable vitals.  Pelvic x-ray shows displaced right femoral neck fracture. EDP talked to Dr. Aline Brochure, he will see in the morning.  Subjective Patient's pain much better after the surgery  Physical Exam: Vitals:   10/01/21 1921 10/01/21 2036 10/02/21 0605 10/02/21 0855  BP:  (!) 124/57 115/69   Pulse:  93 90   Resp:  18 18   Temp:  97.8 F (36.6 C) 98.6 F (37 C)   TempSrc:  Oral    SpO2: (!) 87% 99% 99% 98%  Weight:      Height:        Constitutional: NAD, calm, comfortable Vitals:   10/01/21 1921 10/01/21 2036 10/02/21 0605 10/02/21 0855  BP:  (!) 124/57 115/69   Pulse:  93 90   Resp:  18 18   Temp:  97.8 F (36.6 C) 98.6 F (37 C)   TempSrc:  Oral    SpO2: (!) 87% 99% 99% 98%  Weight:      Height:       Eyes: PERRL, lids and conjunctivae normal ENMT: Mucous membranes are moist. .  Neck: normal, supple, no masses, no thyromegaly Respiratory: clear to auscultation bilaterally, no wheezing, no crackles. Normal respiratory effort. No accessory muscle use.  Cardiovascular: Regular rate and rhythm, no murmurs / rubs / gallops. No extremity edema.  Lower extremities warm. Abdomen: no tenderness, no masses palpated. No hepatosplenomegaly. Bowel sounds positive.  Musculoskeletal: no clubbing / cyanosis. No joint deformity upper and lower extremities. Good ROM, no  contractures. Normal muscle tone.  Skin: no rashes, lesions, ulcers. No induration Neurologic: No apparent cranial nerve abnormality, moving all 3 extremities spontaneously- right lower extremity not tested. Psychiatric: Normal judgment and insight. Alert and oriented x 3. Normal mood.   Labs on Admission: I have personally reviewed following labs and imaging studies  CBC: Recent Labs  Lab 09/30/21 1820 10/02/21 0500  WBC 11.2* 9.9  NEUTROABS 9.7*  --   HGB 13.5 10.5*  HCT 45.3 36.3  MCV 97.2 100.3*  PLT 192 979   Basic Metabolic Panel: Recent Labs  Lab 09/30/21 1820 10/02/21 0500  NA 140 141  K 3.6 4.6  CL 96* 100  CO2 38* 36*  GLUCOSE 149* 116*  BUN 20 17  CREATININE 0.69 0.71  CALCIUM 8.8* 8.1*    Radiological Exams on Admission: DG Pelvis Portable  Result Date: 10/01/2021 CLINICAL DATA:  Right hip arthroplasty. EXAM: PORTABLE PELVIS 1-2 VIEWS COMPARISON:  09/30/2021. FINDINGS: Interval right total hip arthroplasty. Femoral stem appears well seated. Subcutaneous and joint air in association. Surgical skin staples overlie the right hip. Previous dynamic hip screw and IM rod in the left femur. Osteopenia. IMPRESSION: Interval right total hip arthroplasty with expected postoperative findings. Electronically Signed   By: Lorin Picket M.D.   On: 10/01/2021 15:13  DG Pelvis Portable  Result Date: 09/30/2021 CLINICAL DATA:  Pain after fall last night. EXAM: PORTABLE PELVIS 1-2 VIEWS COMPARISON:  Pelvis radiograph 08/01/2018 FINDINGS: Displaced right femoral neck fracture. The femoral head remains seated. The bones are diffusely under mineralized. Intact pubic rami. Pubic symphysis and sacroiliac joints are congruent. Surgical hardware traverses prior intertrochanteric left femur fracture. Prominent vascular calcifications. IMPRESSION: Displaced right femoral neck fracture. Electronically Signed   By: Keith Rake M.D.   On: 09/30/2021 17:45   Chest Portable 1  View  Result Date: 09/30/2021 CLINICAL DATA:  Preoperative evaluation for hip fracture fixation or replacement. EXAM: PORTABLE CHEST 1 VIEW COMPARISON:  Portable chest 06/04/2020. FINDINGS: The lungs are emphysematous. There are coarse left lower lobe infrahilar markings in the location of the prior dense left lower lobe consolidation. This could indicate post pneumonic scarring or a recurrent streaky infiltrate. The remaining lungs are clear. No pleural effusion is seen. The heart is slightly enlarged with normal caliber central vessels. The aorta is tortuous and calcified. Stable mediastinum. Osteopenia. IMPRESSION: Coarse left infrahilar linear markings in an area of previous dense left lower lobe consolidation, could represent post pneumonic scarring or a recurrent streaky infiltrate or small aspiration. COPD. Aortic atherosclerosis. Electronically Signed   By: Telford Nab M.D.   On: 09/30/2021 21:34   DG FEMUR PORT, MIN 2 VIEWS RIGHT  Result Date: 09/30/2021 CLINICAL DATA:  Right hip pain after fall last night. EXAM: RIGHT FEMUR PORTABLE 2 VIEW COMPARISON:  None. FINDINGS: Displaced right femoral neck fracture. The femoral head remains seated. Diffuse bony under mineralization. Osteoarthritis of the knee. Soft tissues are atrophic. Vascular calcifications are seen. IMPRESSION: Displaced right femoral neck fracture. Electronically Signed   By: Keith Rake M.D.   On: 09/30/2021 17:44    EKG: Normal sinus rhythm no acute changes  Assessment/Plan Principal Problem:   Closed displaced fracture of right femoral neck (HCC) Active Problems:   COPD Golds D, frequent exacerbations   Hypertension   Depression   Chronic respiratory failure with hypoxia (HCC)   Fall at home, initial encounter   Dementia with behavioral disturbance   Mechanical fall with subsequent closed displaced fracture of the right femoral neck-  -Postop day #1 with Dr. Aline Brochure -DC IV fluids -IV morphine 2 mg every 4  hours as needed -Proceed with physical therapy evaluation  COPD chronic respiratory failure-on 3 L.  Lung exam clear. -Resume home bronchodilator regimen  Hypertension- Elevated -resume Norvasc,  Depression-stable. - Resume Wellbutrin  Dementia-stable. Lives at home with spouse.   DVT prophylaxis: SCDs Code Status: Full code Family Communication: None  disposition Plan: > 2 days will likely need rehab Consults called: Orthopedics Admission status: inpt, med surg I certify that at the point of admission it is my clinical judgment that the patient will require inpatient hospital care spanning beyond 2 midnights from the point of admission due to high intensity of service, high risk for further deterioration and high frequency of surveillance required.    Phillips Grout MD [facility]  10/02/2021, 10:10 AM   Patient ID: [name], female   DOB: 1941/03/10, 81 y.o.   MRN: [id number]

## 2021-10-03 DIAGNOSIS — M545 Low back pain, unspecified: Secondary | ICD-10-CM | POA: Diagnosis not present

## 2021-10-03 DIAGNOSIS — R32 Unspecified urinary incontinence: Secondary | ICD-10-CM | POA: Diagnosis not present

## 2021-10-03 DIAGNOSIS — R2689 Other abnormalities of gait and mobility: Secondary | ICD-10-CM | POA: Diagnosis not present

## 2021-10-03 DIAGNOSIS — J9621 Acute and chronic respiratory failure with hypoxia: Secondary | ICD-10-CM | POA: Diagnosis not present

## 2021-10-03 DIAGNOSIS — F0390 Unspecified dementia without behavioral disturbance: Secondary | ICD-10-CM | POA: Diagnosis not present

## 2021-10-03 DIAGNOSIS — Z789 Other specified health status: Secondary | ICD-10-CM | POA: Diagnosis not present

## 2021-10-03 DIAGNOSIS — R1319 Other dysphagia: Secondary | ICD-10-CM | POA: Diagnosis not present

## 2021-10-03 DIAGNOSIS — Z79899 Other long term (current) drug therapy: Secondary | ICD-10-CM | POA: Diagnosis not present

## 2021-10-03 DIAGNOSIS — K5900 Constipation, unspecified: Secondary | ICD-10-CM | POA: Diagnosis not present

## 2021-10-03 DIAGNOSIS — S0990XA Unspecified injury of head, initial encounter: Secondary | ICD-10-CM | POA: Diagnosis not present

## 2021-10-03 DIAGNOSIS — L89320 Pressure ulcer of left buttock, unstageable: Secondary | ICD-10-CM | POA: Diagnosis not present

## 2021-10-03 DIAGNOSIS — I5030 Unspecified diastolic (congestive) heart failure: Secondary | ICD-10-CM | POA: Diagnosis not present

## 2021-10-03 DIAGNOSIS — S199XXA Unspecified injury of neck, initial encounter: Secondary | ICD-10-CM | POA: Diagnosis not present

## 2021-10-03 DIAGNOSIS — I509 Heart failure, unspecified: Secondary | ICD-10-CM | POA: Diagnosis not present

## 2021-10-03 DIAGNOSIS — F32A Depression, unspecified: Secondary | ICD-10-CM | POA: Diagnosis not present

## 2021-10-03 DIAGNOSIS — M25552 Pain in left hip: Secondary | ICD-10-CM | POA: Diagnosis not present

## 2021-10-03 DIAGNOSIS — F339 Major depressive disorder, recurrent, unspecified: Secondary | ICD-10-CM | POA: Diagnosis not present

## 2021-10-03 DIAGNOSIS — M2578 Osteophyte, vertebrae: Secondary | ICD-10-CM | POA: Diagnosis not present

## 2021-10-03 DIAGNOSIS — J449 Chronic obstructive pulmonary disease, unspecified: Secondary | ICD-10-CM | POA: Diagnosis present

## 2021-10-03 DIAGNOSIS — Y92009 Unspecified place in unspecified non-institutional (private) residence as the place of occurrence of the external cause: Secondary | ICD-10-CM | POA: Diagnosis not present

## 2021-10-03 DIAGNOSIS — J9610 Chronic respiratory failure, unspecified whether with hypoxia or hypercapnia: Secondary | ICD-10-CM | POA: Diagnosis not present

## 2021-10-03 DIAGNOSIS — R636 Underweight: Secondary | ICD-10-CM | POA: Diagnosis not present

## 2021-10-03 DIAGNOSIS — K219 Gastro-esophageal reflux disease without esophagitis: Secondary | ICD-10-CM | POA: Diagnosis not present

## 2021-10-03 DIAGNOSIS — R000 Tachycardia, unspecified: Secondary | ICD-10-CM | POA: Diagnosis not present

## 2021-10-03 DIAGNOSIS — S72041D Displaced fracture of base of neck of right femur, subsequent encounter for closed fracture with routine healing: Secondary | ICD-10-CM | POA: Diagnosis not present

## 2021-10-03 DIAGNOSIS — E782 Mixed hyperlipidemia: Secondary | ICD-10-CM | POA: Diagnosis not present

## 2021-10-03 DIAGNOSIS — R1030 Lower abdominal pain, unspecified: Secondary | ICD-10-CM | POA: Diagnosis not present

## 2021-10-03 DIAGNOSIS — M25551 Pain in right hip: Secondary | ICD-10-CM | POA: Diagnosis not present

## 2021-10-03 DIAGNOSIS — U071 COVID-19: Secondary | ICD-10-CM | POA: Diagnosis not present

## 2021-10-03 DIAGNOSIS — W010XXA Fall on same level from slipping, tripping and stumbling without subsequent striking against object, initial encounter: Secondary | ICD-10-CM | POA: Diagnosis not present

## 2021-10-03 DIAGNOSIS — R531 Weakness: Secondary | ICD-10-CM | POA: Diagnosis not present

## 2021-10-03 DIAGNOSIS — I700 Atherosclerosis of aorta: Secondary | ICD-10-CM | POA: Diagnosis not present

## 2021-10-03 DIAGNOSIS — F419 Anxiety disorder, unspecified: Secondary | ICD-10-CM | POA: Diagnosis not present

## 2021-10-03 DIAGNOSIS — E039 Hypothyroidism, unspecified: Secondary | ICD-10-CM | POA: Diagnosis not present

## 2021-10-03 DIAGNOSIS — R410 Disorientation, unspecified: Secondary | ICD-10-CM | POA: Diagnosis not present

## 2021-10-03 DIAGNOSIS — E785 Hyperlipidemia, unspecified: Secondary | ICD-10-CM | POA: Diagnosis not present

## 2021-10-03 DIAGNOSIS — I110 Hypertensive heart disease with heart failure: Secondary | ICD-10-CM | POA: Diagnosis not present

## 2021-10-03 DIAGNOSIS — M4319 Spondylolisthesis, multiple sites in spine: Secondary | ICD-10-CM | POA: Diagnosis not present

## 2021-10-03 DIAGNOSIS — N390 Urinary tract infection, site not specified: Secondary | ICD-10-CM | POA: Diagnosis not present

## 2021-10-03 DIAGNOSIS — M6281 Muscle weakness (generalized): Secondary | ICD-10-CM | POA: Diagnosis not present

## 2021-10-03 DIAGNOSIS — S72141D Displaced intertrochanteric fracture of right femur, subsequent encounter for closed fracture with routine healing: Secondary | ICD-10-CM | POA: Diagnosis not present

## 2021-10-03 DIAGNOSIS — R4182 Altered mental status, unspecified: Secondary | ICD-10-CM | POA: Diagnosis present

## 2021-10-03 DIAGNOSIS — R051 Acute cough: Secondary | ICD-10-CM | POA: Diagnosis not present

## 2021-10-03 DIAGNOSIS — Z20822 Contact with and (suspected) exposure to covid-19: Secondary | ICD-10-CM | POA: Diagnosis not present

## 2021-10-03 DIAGNOSIS — J189 Pneumonia, unspecified organism: Secondary | ICD-10-CM | POA: Diagnosis not present

## 2021-10-03 DIAGNOSIS — F03918 Unspecified dementia, unspecified severity, with other behavioral disturbance: Secondary | ICD-10-CM | POA: Diagnosis not present

## 2021-10-03 DIAGNOSIS — R404 Transient alteration of awareness: Secondary | ICD-10-CM | POA: Diagnosis not present

## 2021-10-03 DIAGNOSIS — S72001A Fracture of unspecified part of neck of right femur, initial encounter for closed fracture: Secondary | ICD-10-CM | POA: Diagnosis not present

## 2021-10-03 DIAGNOSIS — R269 Unspecified abnormalities of gait and mobility: Secondary | ICD-10-CM | POA: Diagnosis not present

## 2021-10-03 DIAGNOSIS — Z23 Encounter for immunization: Secondary | ICD-10-CM | POA: Diagnosis not present

## 2021-10-03 DIAGNOSIS — J439 Emphysema, unspecified: Secondary | ICD-10-CM | POA: Diagnosis not present

## 2021-10-03 DIAGNOSIS — I10 Essential (primary) hypertension: Secondary | ICD-10-CM | POA: Diagnosis not present

## 2021-10-03 DIAGNOSIS — Z7982 Long term (current) use of aspirin: Secondary | ICD-10-CM | POA: Diagnosis not present

## 2021-10-03 DIAGNOSIS — R4189 Other symptoms and signs involving cognitive functions and awareness: Secondary | ICD-10-CM | POA: Diagnosis not present

## 2021-10-03 DIAGNOSIS — J441 Chronic obstructive pulmonary disease with (acute) exacerbation: Secondary | ICD-10-CM | POA: Diagnosis not present

## 2021-10-03 DIAGNOSIS — W19XXXD Unspecified fall, subsequent encounter: Secondary | ICD-10-CM | POA: Diagnosis not present

## 2021-10-03 DIAGNOSIS — R262 Difficulty in walking, not elsewhere classified: Secondary | ICD-10-CM | POA: Diagnosis not present

## 2021-10-03 DIAGNOSIS — Z96641 Presence of right artificial hip joint: Secondary | ICD-10-CM | POA: Diagnosis not present

## 2021-10-03 LAB — CBC
HCT: 34.5 % — ABNORMAL LOW (ref 36.0–46.0)
Hemoglobin: 10 g/dL — ABNORMAL LOW (ref 12.0–15.0)
MCH: 29.3 pg (ref 26.0–34.0)
MCHC: 29 g/dL — ABNORMAL LOW (ref 30.0–36.0)
MCV: 101.2 fL — ABNORMAL HIGH (ref 80.0–100.0)
Platelets: 184 10*3/uL (ref 150–400)
RBC: 3.41 MIL/uL — ABNORMAL LOW (ref 3.87–5.11)
RDW: 13.2 % (ref 11.5–15.5)
WBC: 10.4 10*3/uL (ref 4.0–10.5)
nRBC: 0 % (ref 0.0–0.2)

## 2021-10-03 LAB — BASIC METABOLIC PANEL
Anion gap: 11 (ref 5–15)
BUN: 22 mg/dL (ref 8–23)
CO2: 34 mmol/L — ABNORMAL HIGH (ref 22–32)
Calcium: 8.6 mg/dL — ABNORMAL LOW (ref 8.9–10.3)
Chloride: 99 mmol/L (ref 98–111)
Creatinine, Ser: 0.65 mg/dL (ref 0.44–1.00)
GFR, Estimated: >60 mL/min (ref >60)
Glucose, Bld: 136 mg/dL — ABNORMAL HIGH (ref 70–99)
Potassium: 4.2 mmol/L (ref 3.5–5.1)
Sodium: 144 mmol/L (ref 135–145)

## 2021-10-03 MED ORDER — ASPIRIN 325 MG PO TBEC: 325.0000 mg | DELAYED_RELEASE_TABLET | Freq: Every day | ORAL | 0 refills | Status: AC

## 2021-10-03 NOTE — TOC Transition Note (Signed)
Transition of Care [facility]) - CM/SW Discharge Note   Patient Details  Name: [name] MRN: [id number] Date of Birth: [birth year]  Transition of Care [facility]) CM/SW Contact:  Iona Beard, [place] Phone Number: 10/03/2021, 12:32 PM   Clinical Narrative:    CSW spoke with Debbie at [facility] who states they can accept pt for admission today. Pt will go to B7 bed 1. CSW updated RN and MD that facility could accept today. CSW provided RN with number for report. CSW spoke with pt and son Rodman Key in room to update that discharge to facility will happen today. Pt will need wheelchair transport, TOC to set up. TOC signing off.   Final next level of care: Skilled Nursing Facility Barriers to Discharge: Barriers Resolved   Patient Goals and CMS Choice Patient states their goals for this hospitalization and ongoing recovery are:: Go to SNF CMS Medicare.gov Compare Post Acute Care list provided to:: Patient Choice offered to / list presented to : Patient, Adult Children  Discharge Placement              Patient chooses bed at: Other - please specify in the comment section below: [facility]) Patient to be transferred to facility by: [place] Name of family member notified: Sibel Khurana Patient and family notified of of transfer: 10/03/21  Discharge Plan and Services In-house Referral: Clinical Social Work Discharge Planning Services: CM Consult Post Acute Care Choice: [place]                               Social Determinants of Health (SDOH) Interventions     Readmission Risk Interventions Readmission Risk Prevention Plan 10/02/2021  Medication Screening Complete  Transportation Screening Complete  Some recent data might be hidden

## 2021-10-03 NOTE — Progress Notes (Signed)
Patient ID: [name], female   DOB: 05-02-1941, 81 y.o.   MRN: [id number] Status post bipolar replacement for hip fracture, right hip  Patient is very confused  BP (!) 126/98 (BP Location: Right Arm)    Pulse 100    Temp 99 F (37.2 C)    Resp 18    Ht 5' (1.524 m)    Wt 39.7 kg    SpO2 99%    BMI 17.09 kg/m   CBC Latest Ref Rng & Units 10/03/2021 10/02/2021 09/30/2021  WBC 4.0 - 10.5 K/uL 10.4 9.9 11.2(H)  Hemoglobin 12.0 - 15.0 g/dL 10.0(L) 10.5(L) 13.5  Hematocrit 36.0 - 46.0 % 34.5(L) 36.3 45.3  Platelets 150 - 400 K/uL 184 180 192     Postop plan   Weightbearing as tolerated Direct lateral hip precautions DVT prophylaxis for 30 days Remove staples at 12 to 14 days Postop appointment scheduled for 28 days   [id number]

## 2021-10-03 NOTE — Discharge Summary (Signed)
Physician Discharge Summary  Patient ID: [name] MRN: [id number] DOB/AGE: 81-Apr-1942 81 y.o.  Admit date: 09/30/2021 Discharge date: 10/03/2021  Admission Diagnoses:  Discharge Diagnoses:  Principal Problem:   Closed displaced fracture of right femoral neck ([place]) Active Problems:   COPD [place], frequent exacerbations   Hypertension   Depression   Chronic respiratory failure with hypoxia ([place])   Fall at home, initial encounter   Dementia with behavioral disturbance     Discharged Condition: stable  Hospital Course: [name] is a 81 y.o. female with medical history significant for COPD with chronic respiratory failure, dementia, depression, fall, CHF, hypertension. Patient was brought to the ED via EMS with reports of fall.  Patient reports she got up from a sitting position and turned around quickly lost her balance and fell.  She did not hit her head.  Since fall she has had pain to her right lower extremity.  She has not been able to walk.  She reports mild intermittent dizziness chronic and stable for several years.  She was found to have a femoral neck fracture.  Dr. Aline Brochure with orthopedic surgery team was consulted to patient to the OR for surgical repair.  Postoperatively patient has been doing very well.  She has been stable on her 4 L nasal cannula for her chronic respiratory failure.  She is been participating with physical therapy.  Patient qualify for short-term rehab and is being discharged to short-term rehab for hopefully a full recovery and return to home in the next 30 to 60 days.  She is to follow-up with primary care physician in 1 week.  She is also to follow-up with Dr. Aline Brochure in 2 to 4 weeks.    Mechanical fall with subsequent closed displaced fracture of the right femoral neck-  -Proceed with outpatient physical therapy -Aspirin for the next 30 days for DVT prophylaxis   COPD chronic respiratory failure-on 3 L.  Lung exam clear. -Resume home  bronchodilator regimen   Hypertension- Elevated -resume Norvasc,   Depression-stable. - Resume Wellbutrin   Dementia-stable. Lives at home with spouse.      Discharge Exam: Blood pressure (!) 126/98, pulse 100, temperature 99 F (37.2 C), resp. rate 18, height 5' (1.524 m), weight 39.7 kg, SpO2 99 %. General appearance: alert and cooperative Head: Normocephalic, without obvious abnormality, atraumatic Resp: clear to auscultation bilaterally Cardio: regular rate and rhythm, S1, S2 normal, no murmur, click, rub or gallop GI: soft, non-tender; bowel sounds normal; no masses,  no organomegaly Extremities: extremities normal, atraumatic, no cyanosis or edema Pulses: 2+ and symmetric Skin: Skin color, texture, turgor normal. No rashes or lesions  Disposition: Discharge disposition: 03-Skilled Nursing Facility       Discharge Instructions     Diet - low sodium heart healthy   Complete by: As directed    Discharge instructions   Complete by: As directed    Follow-up with orthopedic surgery Dr. Aline Brochure as scheduled  Follow-up with primary care physician in 1 week   Discharge wound care:   Complete by: As directed    Per orthopedic surgery   Increase activity slowly   Complete by: As directed       Allergies as of 10/03/2021       Reactions   Bee Venom Swelling   SWELLING REACTION UNSPECIFIED         Medication List     STOP taking these medications    ALPRAZolam 0.5 MG tablet Commonly known as:  XANAX   amLODipine 10 MG tablet Commonly known as: NORVASC   feeding supplement Liqd   guaiFENesin 600 MG 12 hr tablet Commonly known as: MUCINEX   levalbuterol 1.25 MG/3ML nebulizer solution Commonly known as: XOPENEX   multivitamin capsule   oxyCODONE 5 MG immediate release tablet Commonly known as: Oxy IR/ROXICODONE   pravastatin 40 MG tablet Commonly known as: PRAVACHOL   torsemide 20 MG tablet Commonly known as: DEMADEX   VITAMIN C PO        TAKE these medications    acetaminophen 325 MG tablet Commonly known as: TYLENOL Take 2 tablets (650 mg total) by mouth every 6 (six) hours as needed for mild pain (or Fever >/= 101).   albuterol (2.5 MG/3ML) 0.083% nebulizer solution Commonly known as: PROVENTIL Take 3 mLs (2.5 mg total) by nebulization every 6 (six) hours as needed for wheezing or shortness of breath.   aspirin 325 MG EC tablet Take 1 tablet (325 mg total) by mouth daily with breakfast. Start taking on: October 04, 2021   buPROPion 150 MG 24 hr tablet Commonly known as: WELLBUTRIN XL Take 150 mg by mouth daily.   escitalopram 5 MG tablet Commonly known as: LEXAPRO Take 5 mg by mouth daily.   levothyroxine 50 MCG tablet Commonly known as: SYNTHROID Take 50 mcg by mouth daily before breakfast.   mirtazapine 15 MG tablet Commonly known as: REMERON Take 7.5-15 mg by mouth at bedtime as needed.   pantoprazole 40 MG tablet Commonly known as: PROTONIX Take 1 tablet (40 mg total) by mouth daily before breakfast.   polyethylene glycol 17 g packet Commonly known as: MIRALAX / GLYCOLAX Take 17 g by mouth 2 (two) times daily.   tiotropium 18 MCG inhalation capsule Commonly known as: SPIRIVA Place 1 capsule (18 mcg total) into inhaler and inhale daily. What changed:  when to take this reasons to take this               Discharge Care Instructions  (From admission, onward)           Start     Ordered   10/03/21 0000  Discharge wound care:       Comments: Per orthopedic surgery   10/03/21 1112             Signed: Kooper Godshall A 10/03/2021, 1:12 PM

## 2021-10-03 NOTE — Care Management Important Message (Deleted)
Important Message  Patient Details  Name: [name] MRN: [id number] Date of Birth: 12-03-1940   Medicare Important Message Given:  Yes     Tommy Medal 10/03/2021, 11:02 AM

## 2021-10-03 NOTE — Progress Notes (Signed)
Patient stable this shift. Patient is making urine did not have to in and out cath. Remains on 4L O2 [place].

## 2021-10-03 NOTE — Care Management Important Message (Signed)
Important Message  Patient Details  Name: [name] MRN: [id number] Date of Birth: 1941/04/18   Medicare Important Message Given:  Yes (spoke with son Kailie Polus at [phone], explained letter, copy will be left in room as requested)     Tommy Medal 10/03/2021, 1:05 PM

## 2021-10-06 DIAGNOSIS — W19XXXD Unspecified fall, subsequent encounter: Secondary | ICD-10-CM | POA: Diagnosis not present

## 2021-10-06 DIAGNOSIS — F32A Depression, unspecified: Secondary | ICD-10-CM | POA: Diagnosis not present

## 2021-10-06 DIAGNOSIS — J9610 Chronic respiratory failure, unspecified whether with hypoxia or hypercapnia: Secondary | ICD-10-CM | POA: Diagnosis not present

## 2021-10-06 DIAGNOSIS — J449 Chronic obstructive pulmonary disease, unspecified: Secondary | ICD-10-CM | POA: Diagnosis not present

## 2021-10-06 DIAGNOSIS — F03918 Unspecified dementia, unspecified severity, with other behavioral disturbance: Secondary | ICD-10-CM | POA: Diagnosis not present

## 2021-10-06 DIAGNOSIS — I10 Essential (primary) hypertension: Secondary | ICD-10-CM | POA: Diagnosis not present

## 2021-10-06 DIAGNOSIS — E785 Hyperlipidemia, unspecified: Secondary | ICD-10-CM | POA: Diagnosis not present

## 2021-10-06 DIAGNOSIS — S72001A Fracture of unspecified part of neck of right femur, initial encounter for closed fracture: Secondary | ICD-10-CM | POA: Diagnosis not present

## 2021-10-07 DIAGNOSIS — E782 Mixed hyperlipidemia: Secondary | ICD-10-CM | POA: Diagnosis not present

## 2021-10-07 DIAGNOSIS — I10 Essential (primary) hypertension: Secondary | ICD-10-CM | POA: Diagnosis not present

## 2021-10-10 DIAGNOSIS — I10 Essential (primary) hypertension: Secondary | ICD-10-CM | POA: Diagnosis not present

## 2021-10-10 DIAGNOSIS — J449 Chronic obstructive pulmonary disease, unspecified: Secondary | ICD-10-CM | POA: Diagnosis not present

## 2021-10-10 DIAGNOSIS — E039 Hypothyroidism, unspecified: Secondary | ICD-10-CM | POA: Diagnosis not present

## 2021-10-10 DIAGNOSIS — S72001A Fracture of unspecified part of neck of right femur, initial encounter for closed fracture: Secondary | ICD-10-CM | POA: Diagnosis not present

## 2021-10-10 DIAGNOSIS — J9610 Chronic respiratory failure, unspecified whether with hypoxia or hypercapnia: Secondary | ICD-10-CM | POA: Diagnosis not present

## 2021-10-10 DIAGNOSIS — W19XXXD Unspecified fall, subsequent encounter: Secondary | ICD-10-CM | POA: Diagnosis not present

## 2021-10-10 DIAGNOSIS — F32A Depression, unspecified: Secondary | ICD-10-CM | POA: Diagnosis not present

## 2021-10-10 DIAGNOSIS — F0390 Unspecified dementia without behavioral disturbance: Secondary | ICD-10-CM | POA: Diagnosis not present

## 2021-10-16 DIAGNOSIS — Z789 Other specified health status: Secondary | ICD-10-CM | POA: Diagnosis not present

## 2021-10-16 DIAGNOSIS — K5900 Constipation, unspecified: Secondary | ICD-10-CM | POA: Diagnosis not present

## 2021-10-16 DIAGNOSIS — I10 Essential (primary) hypertension: Secondary | ICD-10-CM | POA: Diagnosis not present

## 2021-10-16 DIAGNOSIS — E785 Hyperlipidemia, unspecified: Secondary | ICD-10-CM | POA: Diagnosis not present

## 2021-10-16 DIAGNOSIS — S72001A Fracture of unspecified part of neck of right femur, initial encounter for closed fracture: Secondary | ICD-10-CM | POA: Diagnosis not present

## 2021-10-16 DIAGNOSIS — J449 Chronic obstructive pulmonary disease, unspecified: Secondary | ICD-10-CM | POA: Diagnosis not present

## 2021-10-16 DIAGNOSIS — K219 Gastro-esophageal reflux disease without esophagitis: Secondary | ICD-10-CM | POA: Diagnosis not present

## 2021-10-30 ENCOUNTER — Ambulatory Visit (INDEPENDENT_AMBULATORY_CARE_PROVIDER_SITE_OTHER): Payer: Medicare Other | Admitting: Orthopedic Surgery

## 2021-10-30 ENCOUNTER — Encounter: Payer: Self-pay | Admitting: Orthopedic Surgery

## 2021-10-30 ENCOUNTER — Other Ambulatory Visit: Payer: Self-pay

## 2021-10-30 DIAGNOSIS — S72001D Fracture of unspecified part of neck of right femur, subsequent encounter for closed fracture with routine healing: Secondary | ICD-10-CM

## 2021-10-30 NOTE — Progress Notes (Signed)
Chief Complaint  Patient presents with   Post-op Follow-up    10/01/21 Right hip bioplar replacement / patient states she feel stronger is walking some     Ms. 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   Closed fracture of right hip with routine healing, subsequent encounter Bipolar hip replaced 10/01/21 Yes

## 2021-11-05 DIAGNOSIS — R531 Weakness: Secondary | ICD-10-CM | POA: Diagnosis not present

## 2021-11-05 DIAGNOSIS — S0990XA Unspecified injury of head, initial encounter: Secondary | ICD-10-CM | POA: Diagnosis not present

## 2021-11-05 DIAGNOSIS — I10 Essential (primary) hypertension: Secondary | ICD-10-CM | POA: Diagnosis not present

## 2021-11-05 DIAGNOSIS — F32A Depression, unspecified: Secondary | ICD-10-CM | POA: Diagnosis not present

## 2021-11-05 DIAGNOSIS — Z20822 Contact with and (suspected) exposure to covid-19: Secondary | ICD-10-CM | POA: Diagnosis not present

## 2021-11-05 DIAGNOSIS — M6281 Muscle weakness (generalized): Secondary | ICD-10-CM | POA: Diagnosis present

## 2021-11-05 DIAGNOSIS — Z96641 Presence of right artificial hip joint: Secondary | ICD-10-CM | POA: Diagnosis not present

## 2021-11-05 DIAGNOSIS — W19XXXD Unspecified fall, subsequent encounter: Secondary | ICD-10-CM | POA: Diagnosis not present

## 2021-11-05 DIAGNOSIS — R636 Underweight: Secondary | ICD-10-CM | POA: Diagnosis not present

## 2021-11-05 DIAGNOSIS — I5030 Unspecified diastolic (congestive) heart failure: Secondary | ICD-10-CM | POA: Diagnosis not present

## 2021-11-05 DIAGNOSIS — R269 Unspecified abnormalities of gait and mobility: Secondary | ICD-10-CM | POA: Diagnosis not present

## 2021-11-05 DIAGNOSIS — M4319 Spondylolisthesis, multiple sites in spine: Secondary | ICD-10-CM | POA: Diagnosis not present

## 2021-11-05 DIAGNOSIS — R4182 Altered mental status, unspecified: Secondary | ICD-10-CM | POA: Diagnosis present

## 2021-11-05 DIAGNOSIS — I700 Atherosclerosis of aorta: Secondary | ICD-10-CM | POA: Diagnosis not present

## 2021-11-05 DIAGNOSIS — S199XXA Unspecified injury of neck, initial encounter: Secondary | ICD-10-CM | POA: Diagnosis not present

## 2021-11-05 DIAGNOSIS — J9621 Acute and chronic respiratory failure with hypoxia: Secondary | ICD-10-CM | POA: Diagnosis not present

## 2021-11-05 DIAGNOSIS — S72001A Fracture of unspecified part of neck of right femur, initial encounter for closed fracture: Secondary | ICD-10-CM | POA: Diagnosis not present

## 2021-11-05 DIAGNOSIS — R1030 Lower abdominal pain, unspecified: Secondary | ICD-10-CM | POA: Diagnosis not present

## 2021-11-05 DIAGNOSIS — K219 Gastro-esophageal reflux disease without esophagitis: Secondary | ICD-10-CM | POA: Diagnosis not present

## 2021-11-05 DIAGNOSIS — Z7982 Long term (current) use of aspirin: Secondary | ICD-10-CM | POA: Diagnosis not present

## 2021-11-05 DIAGNOSIS — F0390 Unspecified dementia without behavioral disturbance: Secondary | ICD-10-CM | POA: Diagnosis not present

## 2021-11-05 DIAGNOSIS — F419 Anxiety disorder, unspecified: Secondary | ICD-10-CM | POA: Diagnosis not present

## 2021-11-05 DIAGNOSIS — F339 Major depressive disorder, recurrent, unspecified: Secondary | ICD-10-CM | POA: Diagnosis not present

## 2021-11-05 DIAGNOSIS — J9610 Chronic respiratory failure, unspecified whether with hypoxia or hypercapnia: Secondary | ICD-10-CM | POA: Diagnosis not present

## 2021-11-05 DIAGNOSIS — U071 COVID-19: Secondary | ICD-10-CM | POA: Diagnosis not present

## 2021-11-05 DIAGNOSIS — K5900 Constipation, unspecified: Secondary | ICD-10-CM | POA: Diagnosis not present

## 2021-11-05 DIAGNOSIS — S72041D Displaced fracture of base of neck of right femur, subsequent encounter for closed fracture with routine healing: Secondary | ICD-10-CM | POA: Diagnosis not present

## 2021-11-05 DIAGNOSIS — R2689 Other abnormalities of gait and mobility: Secondary | ICD-10-CM | POA: Diagnosis present

## 2021-11-05 DIAGNOSIS — M545 Low back pain, unspecified: Secondary | ICD-10-CM | POA: Diagnosis not present

## 2021-11-05 DIAGNOSIS — N390 Urinary tract infection, site not specified: Secondary | ICD-10-CM | POA: Diagnosis not present

## 2021-11-05 DIAGNOSIS — Z79899 Other long term (current) drug therapy: Secondary | ICD-10-CM | POA: Diagnosis not present

## 2021-11-05 DIAGNOSIS — I110 Hypertensive heart disease with heart failure: Secondary | ICD-10-CM | POA: Diagnosis not present

## 2021-11-05 DIAGNOSIS — L89320 Pressure ulcer of left buttock, unstageable: Secondary | ICD-10-CM | POA: Diagnosis not present

## 2021-11-05 DIAGNOSIS — R262 Difficulty in walking, not elsewhere classified: Secondary | ICD-10-CM | POA: Diagnosis present

## 2021-11-05 DIAGNOSIS — I509 Heart failure, unspecified: Secondary | ICD-10-CM | POA: Diagnosis not present

## 2021-11-05 DIAGNOSIS — R32 Unspecified urinary incontinence: Secondary | ICD-10-CM | POA: Diagnosis not present

## 2021-11-05 DIAGNOSIS — E785 Hyperlipidemia, unspecified: Secondary | ICD-10-CM | POA: Diagnosis not present

## 2021-11-05 DIAGNOSIS — J441 Chronic obstructive pulmonary disease with (acute) exacerbation: Secondary | ICD-10-CM | POA: Diagnosis not present

## 2021-11-05 DIAGNOSIS — J449 Chronic obstructive pulmonary disease, unspecified: Secondary | ICD-10-CM | POA: Diagnosis present

## 2021-11-05 DIAGNOSIS — R000 Tachycardia, unspecified: Secondary | ICD-10-CM | POA: Diagnosis not present

## 2021-11-05 DIAGNOSIS — M25551 Pain in right hip: Secondary | ICD-10-CM | POA: Diagnosis not present

## 2021-11-05 DIAGNOSIS — E039 Hypothyroidism, unspecified: Secondary | ICD-10-CM | POA: Diagnosis not present

## 2021-11-05 DIAGNOSIS — M2578 Osteophyte, vertebrae: Secondary | ICD-10-CM | POA: Diagnosis not present

## 2021-11-05 DIAGNOSIS — S72141D Displaced intertrochanteric fracture of right femur, subsequent encounter for closed fracture with routine healing: Secondary | ICD-10-CM | POA: Diagnosis not present

## 2021-11-05 DIAGNOSIS — J439 Emphysema, unspecified: Secondary | ICD-10-CM | POA: Diagnosis not present

## 2021-11-05 DIAGNOSIS — M25552 Pain in left hip: Secondary | ICD-10-CM | POA: Diagnosis not present

## 2021-11-05 DIAGNOSIS — R4189 Other symptoms and signs involving cognitive functions and awareness: Secondary | ICD-10-CM | POA: Diagnosis present

## 2021-11-05 DIAGNOSIS — R404 Transient alteration of awareness: Secondary | ICD-10-CM | POA: Diagnosis not present

## 2021-11-05 DIAGNOSIS — R051 Acute cough: Secondary | ICD-10-CM | POA: Diagnosis not present

## 2021-11-05 DIAGNOSIS — R410 Disorientation, unspecified: Secondary | ICD-10-CM | POA: Diagnosis not present

## 2021-11-05 DIAGNOSIS — J189 Pneumonia, unspecified organism: Secondary | ICD-10-CM | POA: Diagnosis present

## 2021-11-05 DIAGNOSIS — R1319 Other dysphagia: Secondary | ICD-10-CM | POA: Diagnosis present

## 2021-11-06 DIAGNOSIS — U071 COVID-19: Secondary | ICD-10-CM | POA: Diagnosis not present

## 2021-11-06 DIAGNOSIS — K219 Gastro-esophageal reflux disease without esophagitis: Secondary | ICD-10-CM | POA: Diagnosis not present

## 2021-11-06 DIAGNOSIS — E039 Hypothyroidism, unspecified: Secondary | ICD-10-CM | POA: Diagnosis not present

## 2021-11-06 DIAGNOSIS — J449 Chronic obstructive pulmonary disease, unspecified: Secondary | ICD-10-CM | POA: Diagnosis not present

## 2021-11-06 DIAGNOSIS — F32A Depression, unspecified: Secondary | ICD-10-CM | POA: Diagnosis not present

## 2021-11-06 DIAGNOSIS — F0390 Unspecified dementia without behavioral disturbance: Secondary | ICD-10-CM | POA: Diagnosis not present

## 2021-11-06 DIAGNOSIS — E785 Hyperlipidemia, unspecified: Secondary | ICD-10-CM | POA: Diagnosis not present

## 2021-11-06 DIAGNOSIS — I10 Essential (primary) hypertension: Secondary | ICD-10-CM | POA: Diagnosis not present

## 2021-11-10 DIAGNOSIS — K219 Gastro-esophageal reflux disease without esophagitis: Secondary | ICD-10-CM | POA: Diagnosis not present

## 2021-11-10 DIAGNOSIS — F0390 Unspecified dementia without behavioral disturbance: Secondary | ICD-10-CM | POA: Diagnosis not present

## 2021-11-10 DIAGNOSIS — J449 Chronic obstructive pulmonary disease, unspecified: Secondary | ICD-10-CM | POA: Diagnosis not present

## 2021-11-10 DIAGNOSIS — E039 Hypothyroidism, unspecified: Secondary | ICD-10-CM | POA: Diagnosis not present

## 2021-11-10 DIAGNOSIS — E785 Hyperlipidemia, unspecified: Secondary | ICD-10-CM | POA: Diagnosis not present

## 2021-11-10 DIAGNOSIS — F32A Depression, unspecified: Secondary | ICD-10-CM | POA: Diagnosis not present

## 2021-11-10 DIAGNOSIS — J9610 Chronic respiratory failure, unspecified whether with hypoxia or hypercapnia: Secondary | ICD-10-CM | POA: Diagnosis not present

## 2021-11-10 DIAGNOSIS — I10 Essential (primary) hypertension: Secondary | ICD-10-CM | POA: Diagnosis not present

## 2021-11-11 DIAGNOSIS — R269 Unspecified abnormalities of gait and mobility: Secondary | ICD-10-CM | POA: Diagnosis not present

## 2021-11-11 DIAGNOSIS — M6281 Muscle weakness (generalized): Secondary | ICD-10-CM | POA: Diagnosis not present

## 2021-11-19 DIAGNOSIS — R269 Unspecified abnormalities of gait and mobility: Secondary | ICD-10-CM | POA: Diagnosis not present

## 2021-11-19 DIAGNOSIS — R32 Unspecified urinary incontinence: Secondary | ICD-10-CM | POA: Diagnosis not present

## 2021-11-19 DIAGNOSIS — M6281 Muscle weakness (generalized): Secondary | ICD-10-CM | POA: Diagnosis not present

## 2021-11-26 DIAGNOSIS — M6281 Muscle weakness (generalized): Secondary | ICD-10-CM | POA: Diagnosis not present

## 2021-11-26 DIAGNOSIS — L89320 Pressure ulcer of left buttock, unstageable: Secondary | ICD-10-CM | POA: Diagnosis not present

## 2021-11-26 DIAGNOSIS — R269 Unspecified abnormalities of gait and mobility: Secondary | ICD-10-CM | POA: Diagnosis not present

## 2021-11-26 DIAGNOSIS — R32 Unspecified urinary incontinence: Secondary | ICD-10-CM | POA: Diagnosis not present

## 2021-12-03 DIAGNOSIS — R269 Unspecified abnormalities of gait and mobility: Secondary | ICD-10-CM | POA: Diagnosis not present

## 2021-12-03 DIAGNOSIS — L89320 Pressure ulcer of left buttock, unstageable: Secondary | ICD-10-CM | POA: Diagnosis not present

## 2021-12-03 DIAGNOSIS — M6281 Muscle weakness (generalized): Secondary | ICD-10-CM | POA: Diagnosis not present

## 2021-12-03 DIAGNOSIS — R32 Unspecified urinary incontinence: Secondary | ICD-10-CM | POA: Diagnosis not present

## 2021-12-05 DIAGNOSIS — J449 Chronic obstructive pulmonary disease, unspecified: Secondary | ICD-10-CM | POA: Diagnosis not present

## 2021-12-05 DIAGNOSIS — F419 Anxiety disorder, unspecified: Secondary | ICD-10-CM | POA: Diagnosis not present

## 2021-12-05 DIAGNOSIS — K219 Gastro-esophageal reflux disease without esophagitis: Secondary | ICD-10-CM | POA: Diagnosis not present

## 2021-12-05 DIAGNOSIS — I10 Essential (primary) hypertension: Secondary | ICD-10-CM | POA: Diagnosis not present

## 2021-12-05 DIAGNOSIS — F0390 Unspecified dementia without behavioral disturbance: Secondary | ICD-10-CM | POA: Diagnosis not present

## 2021-12-05 DIAGNOSIS — E785 Hyperlipidemia, unspecified: Secondary | ICD-10-CM | POA: Diagnosis not present

## 2021-12-05 DIAGNOSIS — E039 Hypothyroidism, unspecified: Secondary | ICD-10-CM | POA: Diagnosis not present

## 2021-12-05 DIAGNOSIS — J9621 Acute and chronic respiratory failure with hypoxia: Secondary | ICD-10-CM | POA: Diagnosis not present

## 2021-12-10 DIAGNOSIS — L89320 Pressure ulcer of left buttock, unstageable: Secondary | ICD-10-CM | POA: Diagnosis not present

## 2021-12-10 DIAGNOSIS — M6281 Muscle weakness (generalized): Secondary | ICD-10-CM | POA: Diagnosis not present

## 2021-12-10 DIAGNOSIS — R32 Unspecified urinary incontinence: Secondary | ICD-10-CM | POA: Diagnosis not present

## 2021-12-10 DIAGNOSIS — R269 Unspecified abnormalities of gait and mobility: Secondary | ICD-10-CM | POA: Diagnosis not present

## 2021-12-14 ENCOUNTER — Encounter (HOSPITAL_COMMUNITY): Payer: Self-pay | Admitting: Emergency Medicine

## 2021-12-14 ENCOUNTER — Emergency Department (HOSPITAL_COMMUNITY): Payer: Medicare Other

## 2021-12-14 ENCOUNTER — Emergency Department (HOSPITAL_COMMUNITY)
Admission: EM | Admit: 2021-12-14 | Discharge: 2021-12-14 | Disposition: A | Discharge status: Skilled Nursing Facility | Payer: Medicare Other | Attending: Emergency Medicine | Admitting: Emergency Medicine

## 2021-12-14 DIAGNOSIS — I509 Heart failure, unspecified: Secondary | ICD-10-CM | POA: Insufficient documentation

## 2021-12-14 DIAGNOSIS — R000 Tachycardia, unspecified: Secondary | ICD-10-CM | POA: Diagnosis not present

## 2021-12-14 DIAGNOSIS — Z79899 Other long term (current) drug therapy: Secondary | ICD-10-CM | POA: Insufficient documentation

## 2021-12-14 DIAGNOSIS — I110 Hypertensive heart disease with heart failure: Secondary | ICD-10-CM | POA: Insufficient documentation

## 2021-12-14 DIAGNOSIS — M2578 Osteophyte, vertebrae: Secondary | ICD-10-CM | POA: Diagnosis not present

## 2021-12-14 DIAGNOSIS — N390 Urinary tract infection, site not specified: Secondary | ICD-10-CM | POA: Insufficient documentation

## 2021-12-14 DIAGNOSIS — F0390 Unspecified dementia without behavioral disturbance: Secondary | ICD-10-CM | POA: Diagnosis not present

## 2021-12-14 DIAGNOSIS — S199XXA Unspecified injury of neck, initial encounter: Secondary | ICD-10-CM | POA: Diagnosis not present

## 2021-12-14 DIAGNOSIS — S0990XA Unspecified injury of head, initial encounter: Secondary | ICD-10-CM | POA: Diagnosis not present

## 2021-12-14 DIAGNOSIS — M4319 Spondylolisthesis, multiple sites in spine: Secondary | ICD-10-CM | POA: Diagnosis not present

## 2021-12-14 DIAGNOSIS — I700 Atherosclerosis of aorta: Secondary | ICD-10-CM | POA: Diagnosis not present

## 2021-12-14 DIAGNOSIS — Z7982 Long term (current) use of aspirin: Secondary | ICD-10-CM | POA: Insufficient documentation

## 2021-12-14 DIAGNOSIS — J439 Emphysema, unspecified: Secondary | ICD-10-CM | POA: Diagnosis not present

## 2021-12-14 DIAGNOSIS — J449 Chronic obstructive pulmonary disease, unspecified: Secondary | ICD-10-CM | POA: Insufficient documentation

## 2021-12-14 DIAGNOSIS — R4182 Altered mental status, unspecified: Secondary | ICD-10-CM | POA: Diagnosis present

## 2021-12-14 LAB — CBC WITH DIFFERENTIAL/PLATELET
Abs Immature Granulocytes: 0.03 10*3/uL (ref 0.00–0.07)
Basophils Absolute: 0 10*3/uL (ref 0.0–0.1)
Basophils Relative: 0 %
Eosinophils Absolute: 0.1 10*3/uL (ref 0.0–0.5)
Eosinophils Relative: 1 %
HCT: 37.2 % (ref 36.0–46.0)
Hemoglobin: 11 g/dL — ABNORMAL LOW (ref 12.0–15.0)
Immature Granulocytes: 0 %
Lymphocytes Relative: 13 %
Lymphs Abs: 1.1 10*3/uL (ref 0.7–4.0)
MCH: 28.5 pg (ref 26.0–34.0)
MCHC: 29.6 g/dL — ABNORMAL LOW (ref 30.0–36.0)
MCV: 96.4 fL (ref 80.0–100.0)
Monocytes Absolute: 0.8 10*3/uL (ref 0.1–1.0)
Monocytes Relative: 10 %
Neutro Abs: 6.3 10*3/uL (ref 1.7–7.7)
Neutrophils Relative %: 76 %
Platelets: 249 10*3/uL (ref 150–400)
RBC: 3.86 MIL/uL — ABNORMAL LOW (ref 3.87–5.11)
RDW: 13.2 % (ref 11.5–15.5)
WBC: 8.3 10*3/uL (ref 4.0–10.5)
nRBC: 0 % (ref 0.0–0.2)

## 2021-12-14 LAB — CBG MONITORING, ED: Glucose-Capillary: 98 mg/dL (ref 70–99)

## 2021-12-14 LAB — COMPREHENSIVE METABOLIC PANEL
ALT: 16 U/L (ref 0–44)
AST: 15 U/L (ref 15–41)
Albumin: 3.7 g/dL (ref 3.5–5.0)
Alkaline Phosphatase: 74 U/L (ref 38–126)
Anion gap: 7 (ref 5–15)
BUN: 24 mg/dL — ABNORMAL HIGH (ref 8–23)
CO2: 38 mmol/L — ABNORMAL HIGH (ref 22–32)
Calcium: 9.1 mg/dL (ref 8.9–10.3)
Chloride: 97 mmol/L — ABNORMAL LOW (ref 98–111)
Creatinine, Ser: 0.58 mg/dL (ref 0.44–1.00)
GFR, Estimated: >60 mL/min (ref >60)
Glucose, Bld: 118 mg/dL — ABNORMAL HIGH (ref 70–99)
Potassium: 4 mmol/L (ref 3.5–5.1)
Sodium: 142 mmol/L (ref 135–145)
Total Bilirubin: 0.3 mg/dL (ref 0.3–1.2)
Total Protein: 6.8 g/dL (ref 6.5–8.1)

## 2021-12-14 LAB — URINALYSIS, ROUTINE W REFLEX MICROSCOPIC
Bacteria, UA: NONE SEEN
Bilirubin Urine: NEGATIVE
Glucose, UA: NEGATIVE mg/dL
Ketones, ur: NEGATIVE mg/dL
Nitrite: NEGATIVE
Protein, ur: 30 mg/dL — AB
RBC / HPF: >50 RBC/hpf — ABNORMAL HIGH (ref 0–5)
Specific Gravity, Urine: 1.011 (ref 1.005–1.030)
WBC, UA: >50 WBC/hpf — ABNORMAL HIGH (ref 0–5)
pH: 9 — ABNORMAL HIGH (ref 5.0–8.0)

## 2021-12-14 MED ORDER — LIDOCAINE HCL (PF) 1 % IJ SOLN
INTRAMUSCULAR | Status: AC
Start: 2021-12-14 — End: 2021-12-14
  Administered 2021-12-14: 1 mL
  Filled 2021-12-14: qty 5

## 2021-12-14 MED ORDER — CEPHALEXIN 500 MG PO CAPS: 500.0000 mg | ORAL_CAPSULE | Freq: Four times a day (QID) | ORAL | 0 refills | Status: AC

## 2021-12-14 MED ORDER — CEFTRIAXONE SODIUM 500 MG IJ SOLR
500.0000 mg | Freq: Once | INTRAMUSCULAR | Status: AC
  Administered 2021-12-14: 500 mg via INTRAMUSCULAR
  Filled 2021-12-14: qty 500

## 2021-12-14 NOTE — ED Notes (Signed)
[facility] notified of patient needing transportation back to [facility]. ?

## 2021-12-14 NOTE — ED Triage Notes (Signed)
Pt from [facility]. Pt has dementia but is mental status is more altered and has been falling out of bed in the floor.  Pt was on the floor upon ems arrival.  Pt awake alert laughing and joking.  ?

## 2021-12-14 NOTE — ED Provider Notes (Signed)
?[facility] ?Provider Note ? ? ?CSN: [id number] ?Arrival date & time: 12/14/21  1828 ? ?  ? ?History ? ?Chief Complaint  ?Patient presents with  ? Altered Mental Status  ? ? ?[name] is a 81 y.o. female.  With past medical history of COPD on 2 L nasal cannula, hypertension, CHF, weakness, falls, dementia who presents to the emergency department with altered mental status. ? ?Level 5 Caveat: Dementia  ? ?Patient presents from [facility].  Per EMS they report from [facility] was that the patient has dementia but has been altered from her baseline.  Apparently she has been falling out of bed onto the floor.  Patient was on the floor upon EMS arrival.  On my interview the patient is pleasant but disoriented.  She is unable to tell me if she has fallen.  When I ask why she is here in the emergency department she talks about standing on a table and needing to get down.  She denies any chest pain, shortness of breath, abdominal pain, pain with urination, extremity or back pain.  She denies any nausea or vomiting.  She states she has been eating and drinking. ? ? ?Altered Mental Status ? ?  ? ?Home Medications ?Prior to Admission medications   ?Medication Sig Start Date End Date Taking? Authorizing Provider  ?acetaminophen (TYLENOL) 325 MG tablet Take 2 tablets (650 mg total) by mouth every 6 (six) hours as needed for mild pain (or Fever >/= 101). 02/28/18   Roxan Hockey, MD  ?albuterol (PROVENTIL) (2.5 MG/3ML) 0.083% nebulizer solution Take 3 mLs (2.5 mg total) by nebulization every 6 (six) hours as needed for wheezing or shortness of breath. 06/09/20   Oswald Hillock, MD  ?ALPRAZolam Duanne Moron) 0.25 MG tablet Take 0.25 mg by mouth 3 (three) times daily as needed. 08/13/21   [provider]  ?aspirin EC 325 MG EC tablet Take 1 tablet (325 mg total) by mouth daily with breakfast. 10/04/21   Phillips Grout, MD  ?buPROPion (WELLBUTRIN XL) 150 MG 24 hr tablet Take 150 mg by mouth daily.    [provider]  ?DILT-XR 120 MG 24 hr capsule Take 120 mg by mouth every morning. 08/11/21   [provider]  ?escitalopram (LEXAPRO) 5 MG tablet Take 5 mg by mouth daily.    [provider]  ?levothyroxine (SYNTHROID, LEVOTHROID) 50 MCG tablet Take 50 mcg by mouth daily before breakfast.    [provider]  ?mirtazapine (REMERON) 15 MG tablet Take 7.5-15 mg by mouth at bedtime as needed. 09/10/21   [provider]  ?pantoprazole (PROTONIX) 40 MG tablet Take 1 tablet (40 mg total) by mouth daily before breakfast. 02/28/18   Denton Brick, Courage, MD  ?polyethylene glycol (MIRALAX / GLYCOLAX) packet Take 17 g by mouth 2 (two) times daily. 02/28/18   Roxan Hockey, MD  ?tiotropium (SPIRIVA) 18 MCG inhalation capsule Place 1 capsule (18 mcg total) into inhaler and inhale daily. ?Patient taking differently: Place 18 mcg into inhaler and inhale daily as needed (for shortness of breath). 02/26/17 09/30/21  Javier Glazier, MD  ?VENTOLIN HFA 108 (90 Base) MCG/ACT inhaler SMARTSIG:1-2 Puff(s) Via Inhaler Every 4-6 Hours 09/04/21   [provider]  ?   ? ?Allergies    ?Bee venom   ? ?Review of Systems   ?Review of Systems  ?Unable to perform ROS: Dementia  ? ?Physical Exam ?Updated Vital Signs ?BP (!) 155/99 (BP Location: Right Arm)   Pulse Marland Kitchen)  107   Temp 98.7 ?F (37.1 ?C) (Oral)   Resp 16   SpO2 100%  ?Physical Exam ?Vitals and nursing note reviewed.  ?Constitutional:   ?   General: She is not in acute distress. ?   Appearance: Normal appearance. She is normal weight. She is not ill-appearing or toxic-appearing.  ?HENT:  ?   Head: Normocephalic and atraumatic.  ?   Nose: Nose normal.  ?   Mouth/Throat:  ?   Mouth: Mucous membranes are dry.  ?   Pharynx: Oropharynx is clear. No posterior oropharyngeal erythema.  ?Eyes:  ?   General: No scleral icterus. ?   Extraocular Movements: Extraocular movements intact.  ?   Conjunctiva/sclera: Conjunctivae normal.  ?   Pupils: Pupils are  equal, round, and reactive to light.  ?Cardiovascular:  ?   Rate and Rhythm: Normal rate and regular rhythm.  ?   Pulses: Normal pulses.  ?   Heart sounds: Normal heart sounds. No murmur heard. ?Pulmonary:  ?   Effort: Pulmonary effort is normal. No respiratory distress.  ?   Breath sounds: Rales present.  ?   Comments: Crackles in bilateral bases consistent with COPD.  On 2 L satting 97%. ?Chest:  ?   Chest wall: No tenderness.  ?Abdominal:  ?   General: Abdomen is flat. Bowel sounds are normal. There is no distension.  ?   Palpations: Abdomen is soft.  ?   Tenderness: There is no abdominal tenderness.  ?Musculoskeletal:     ?   General: Normal range of motion.  ?   Cervical back: Normal range of motion and neck supple. No tenderness.  ?Skin: ?   General: Skin is warm and dry.  ?   Capillary Refill: Capillary refill takes less than 2 seconds.  ?   Findings: Bruising present. No rash.  ?   Comments: Scattered bruising  ?Neurological:  ?   General: No focal deficit present.  ?   Mental Status: She is alert.  ?   Cranial Nerves: Cranial nerves 2-12 are intact. No cranial nerve deficit, dysarthria or facial asymmetry.  ?   Sensory: Sensation is intact. No sensory deficit.  ?   Motor: Motor function is intact. No weakness or pronator drift.  ?Psychiatric:     ?   Attention and Perception: Attention normal.     ?   Mood and Affect: Mood and affect normal.     ?   Speech: Speech normal.     ?   Behavior: Behavior normal. Behavior is cooperative.     ?   Thought Content: Thought content normal.  ? ? ?ED Results / Procedures / Treatments   ?Labs ?(all labs ordered are listed, but only abnormal results are displayed) ?Labs Reviewed  ?COMPREHENSIVE METABOLIC PANEL - Abnormal; Notable for the following components:  ?    Result Value  ? Chloride 97 (*)   ? CO2 38 (*)   ? Glucose, Bld 118 (*)   ? BUN 24 (*)   ? All other components within normal limits  ?CBC WITH DIFFERENTIAL/PLATELET - Abnormal; Notable for the following  components:  ? RBC 3.86 (*)   ? Hemoglobin 11.0 (*)   ? MCHC 29.6 (*)   ? All other components within normal limits  ?URINALYSIS, ROUTINE W REFLEX MICROSCOPIC - Abnormal; Notable for the following components:  ? APPearance HAZY (*)   ? pH 9.0 (*)   ? Hgb urine dipstick LARGE (*)   ? Protein,  ur 30 (*)   ? Leukocytes,Ua SMALL (*)   ? RBC / HPF >50 (*)   ? WBC, UA >50 (*)   ? Non Squamous Epithelial 0-5 (*)   ? All other components within normal limits  ?URINE CULTURE  ?CBG MONITORING, ED  ? ?EKG ?EKG Interpretation ? ?Date/Time:  Sunday December 14 2021 21:41:08 EDT ?Ventricular Rate:  107 ?PR Interval:  157 ?QRS Duration: 76 ?QT Interval:  310 ?QTC Calculation: 414 ?R Axis:   -6 ?Text Interpretation: Sinus tachycardia Atrial premature complexes Probable left atrial enlargement No significant change since last tracing Confirmed by Octaviano Glow [id number]) on 12/15/2021 12:25:00 PM ? ?Radiology ?CT HEAD WO CONTRAST ? ?Result Date: 12/14/2021 ?CLINICAL DATA:  Head trauma, minor (Age >= 65y); Neck trauma (Age >= 65y) EXAM: CT HEAD WITHOUT CONTRAST CT CERVICAL SPINE WITHOUT CONTRAST TECHNIQUE: Multidetector CT imaging of the head and cervical spine was performed following the standard protocol without intravenous contrast. Multiplanar CT image reconstructions of the cervical spine were also generated. RADIATION DOSE REDUCTION: This exam was performed according to the departmental dose-optimization program which includes automated exposure control, adjustment of the mA and/or kV according to patient size and/or use of iterative reconstruction technique. COMPARISON:  None. FINDINGS: CT HEAD FINDINGS Brain: No evidence of acute infarction, hemorrhage, hydrocephalus, extra-axial collection or mass lesion/mass effect. Periventricular white matter hypodensities consistent with sequela of chronic microvascular ischemic disease. Global parenchymal volume loss. Vascular: Vascular calcifications. Skull: Normal. Negative for fracture or  focal lesion. Sinuses/Orbits: No acute finding. Other: None. CT CERVICAL SPINE FINDINGS Alignment: Straightening of the cervical lordosis. There is minimal anterolisthesis of C2-3, of C3-4, minimal anterolisthesis of C

## 2021-12-14 NOTE — Discharge Instructions (Signed)
You are seen in the emergency department today for increased altered mental status.  You have a UTI.  I am prescribing antibiotics that you will take 4 times a day for the next 7 days.  Please return to the emergency department for fever or worsening symptoms. ?

## 2021-12-15 DIAGNOSIS — E039 Hypothyroidism, unspecified: Secondary | ICD-10-CM | POA: Diagnosis not present

## 2021-12-15 DIAGNOSIS — R636 Underweight: Secondary | ICD-10-CM | POA: Diagnosis not present

## 2021-12-15 DIAGNOSIS — F419 Anxiety disorder, unspecified: Secondary | ICD-10-CM | POA: Diagnosis not present

## 2021-12-15 DIAGNOSIS — J449 Chronic obstructive pulmonary disease, unspecified: Secondary | ICD-10-CM | POA: Diagnosis not present

## 2021-12-15 DIAGNOSIS — F32A Depression, unspecified: Secondary | ICD-10-CM | POA: Diagnosis not present

## 2021-12-15 DIAGNOSIS — K219 Gastro-esophageal reflux disease without esophagitis: Secondary | ICD-10-CM | POA: Diagnosis not present

## 2021-12-15 DIAGNOSIS — N390 Urinary tract infection, site not specified: Secondary | ICD-10-CM | POA: Diagnosis not present

## 2021-12-16 LAB — URINE CULTURE

## 2021-12-19 DIAGNOSIS — N390 Urinary tract infection, site not specified: Secondary | ICD-10-CM | POA: Diagnosis not present

## 2021-12-19 DIAGNOSIS — F419 Anxiety disorder, unspecified: Secondary | ICD-10-CM | POA: Diagnosis not present

## 2021-12-19 DIAGNOSIS — R410 Disorientation, unspecified: Secondary | ICD-10-CM | POA: Diagnosis not present

## 2021-12-22 DIAGNOSIS — J441 Chronic obstructive pulmonary disease with (acute) exacerbation: Secondary | ICD-10-CM | POA: Diagnosis not present

## 2021-12-22 DIAGNOSIS — F339 Major depressive disorder, recurrent, unspecified: Secondary | ICD-10-CM | POA: Diagnosis not present

## 2021-12-22 DIAGNOSIS — I5030 Unspecified diastolic (congestive) heart failure: Secondary | ICD-10-CM | POA: Diagnosis not present

## 2021-12-24 DIAGNOSIS — J449 Chronic obstructive pulmonary disease, unspecified: Secondary | ICD-10-CM | POA: Diagnosis not present

## 2021-12-24 DIAGNOSIS — W19XXXD Unspecified fall, subsequent encounter: Secondary | ICD-10-CM | POA: Diagnosis not present

## 2021-12-24 DIAGNOSIS — F0390 Unspecified dementia without behavioral disturbance: Secondary | ICD-10-CM | POA: Diagnosis not present

## 2021-12-24 DIAGNOSIS — F32A Depression, unspecified: Secondary | ICD-10-CM | POA: Diagnosis not present

## 2021-12-24 DIAGNOSIS — S72001A Fracture of unspecified part of neck of right femur, initial encounter for closed fracture: Secondary | ICD-10-CM | POA: Diagnosis not present

## 2021-12-24 DIAGNOSIS — R531 Weakness: Secondary | ICD-10-CM | POA: Diagnosis not present

## 2021-12-24 DIAGNOSIS — F419 Anxiety disorder, unspecified: Secondary | ICD-10-CM | POA: Diagnosis not present

## 2021-12-24 DIAGNOSIS — E039 Hypothyroidism, unspecified: Secondary | ICD-10-CM | POA: Diagnosis not present

## 2021-12-29 ENCOUNTER — Ambulatory Visit (INDEPENDENT_AMBULATORY_CARE_PROVIDER_SITE_OTHER): Payer: Medicare Other | Admitting: Orthopedic Surgery

## 2021-12-29 DIAGNOSIS — K5900 Constipation, unspecified: Secondary | ICD-10-CM | POA: Diagnosis not present

## 2021-12-29 DIAGNOSIS — E039 Hypothyroidism, unspecified: Secondary | ICD-10-CM | POA: Diagnosis not present

## 2021-12-29 DIAGNOSIS — S72001D Fracture of unspecified part of neck of right femur, subsequent encounter for closed fracture with routine healing: Secondary | ICD-10-CM

## 2021-12-29 DIAGNOSIS — F0390 Unspecified dementia without behavioral disturbance: Secondary | ICD-10-CM | POA: Diagnosis not present

## 2021-12-29 DIAGNOSIS — R531 Weakness: Secondary | ICD-10-CM | POA: Diagnosis not present

## 2021-12-29 DIAGNOSIS — W19XXXD Unspecified fall, subsequent encounter: Secondary | ICD-10-CM | POA: Diagnosis not present

## 2021-12-29 DIAGNOSIS — F32A Depression, unspecified: Secondary | ICD-10-CM | POA: Diagnosis not present

## 2021-12-29 DIAGNOSIS — F419 Anxiety disorder, unspecified: Secondary | ICD-10-CM | POA: Diagnosis not present

## 2021-12-29 DIAGNOSIS — S72001A Fracture of unspecified part of neck of right femur, initial encounter for closed fracture: Secondary | ICD-10-CM | POA: Diagnosis not present

## 2021-12-29 NOTE — Progress Notes (Signed)
FOLLOW UP  ? ?Encounter Diagnosis  ?Name Primary?  ? 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 ? ? ?Chief Complaint  ?Patient presents with  ? Post-op Follow-up  ?  Right hip-feeling better,  fell couple days ago- c/o tailbone hurting  ? ?Surgery date October 01, 2021 patient is in the postop period comes in for follow-up.  She had a right hip bipolar replacement she is doing well ambulating well even after the fall she can move her hip and leg well without pain passive range of motion of the hip reveals no pain ? ?Recommend continue physical therapy and follow-up as needed ?

## 2021-12-31 DIAGNOSIS — I5030 Unspecified diastolic (congestive) heart failure: Secondary | ICD-10-CM | POA: Diagnosis not present

## 2021-12-31 DIAGNOSIS — F339 Major depressive disorder, recurrent, unspecified: Secondary | ICD-10-CM | POA: Diagnosis not present

## 2022-01-01 DIAGNOSIS — R1030 Lower abdominal pain, unspecified: Secondary | ICD-10-CM | POA: Diagnosis not present

## 2022-01-01 DIAGNOSIS — M25551 Pain in right hip: Secondary | ICD-10-CM | POA: Diagnosis not present

## 2022-01-05 DIAGNOSIS — F32A Depression, unspecified: Secondary | ICD-10-CM | POA: Diagnosis not present

## 2022-01-05 DIAGNOSIS — J9621 Acute and chronic respiratory failure with hypoxia: Secondary | ICD-10-CM | POA: Diagnosis not present

## 2022-01-05 DIAGNOSIS — S72001A Fracture of unspecified part of neck of right femur, initial encounter for closed fracture: Secondary | ICD-10-CM | POA: Diagnosis not present

## 2022-01-05 DIAGNOSIS — E785 Hyperlipidemia, unspecified: Secondary | ICD-10-CM | POA: Diagnosis not present

## 2022-01-05 DIAGNOSIS — J9610 Chronic respiratory failure, unspecified whether with hypoxia or hypercapnia: Secondary | ICD-10-CM | POA: Diagnosis not present

## 2022-01-05 DIAGNOSIS — E039 Hypothyroidism, unspecified: Secondary | ICD-10-CM | POA: Diagnosis not present

## 2022-01-05 DIAGNOSIS — I10 Essential (primary) hypertension: Secondary | ICD-10-CM | POA: Diagnosis not present

## 2022-01-05 DIAGNOSIS — J449 Chronic obstructive pulmonary disease, unspecified: Secondary | ICD-10-CM | POA: Diagnosis not present

## 2022-01-15 DIAGNOSIS — I5030 Unspecified diastolic (congestive) heart failure: Secondary | ICD-10-CM | POA: Diagnosis not present

## 2022-01-15 DIAGNOSIS — Z9181 History of falling: Secondary | ICD-10-CM | POA: Diagnosis not present

## 2022-01-15 DIAGNOSIS — Z96641 Presence of right artificial hip joint: Secondary | ICD-10-CM | POA: Diagnosis not present

## 2022-01-15 DIAGNOSIS — J9611 Chronic respiratory failure with hypoxia: Secondary | ICD-10-CM | POA: Diagnosis not present

## 2022-01-15 DIAGNOSIS — F32A Depression, unspecified: Secondary | ICD-10-CM | POA: Diagnosis not present

## 2022-01-15 DIAGNOSIS — E46 Unspecified protein-calorie malnutrition: Secondary | ICD-10-CM | POA: Diagnosis not present

## 2022-01-15 DIAGNOSIS — F419 Anxiety disorder, unspecified: Secondary | ICD-10-CM | POA: Diagnosis not present

## 2022-01-15 DIAGNOSIS — F02818 Dementia in other diseases classified elsewhere, unspecified severity, with other behavioral disturbance: Secondary | ICD-10-CM | POA: Diagnosis not present

## 2022-01-15 DIAGNOSIS — E785 Hyperlipidemia, unspecified: Secondary | ICD-10-CM | POA: Diagnosis not present

## 2022-01-15 DIAGNOSIS — I110 Hypertensive heart disease with heart failure: Secondary | ICD-10-CM | POA: Diagnosis not present

## 2022-01-15 DIAGNOSIS — Z87440 Personal history of urinary (tract) infections: Secondary | ICD-10-CM | POA: Diagnosis not present

## 2022-01-15 DIAGNOSIS — K219 Gastro-esophageal reflux disease without esophagitis: Secondary | ICD-10-CM | POA: Diagnosis not present

## 2022-01-15 DIAGNOSIS — K5900 Constipation, unspecified: Secondary | ICD-10-CM | POA: Diagnosis not present

## 2022-01-15 DIAGNOSIS — Z9981 Dependence on supplemental oxygen: Secondary | ICD-10-CM | POA: Diagnosis not present

## 2022-01-15 DIAGNOSIS — Z993 Dependence on wheelchair: Secondary | ICD-10-CM | POA: Diagnosis not present

## 2022-01-15 DIAGNOSIS — J449 Chronic obstructive pulmonary disease, unspecified: Secondary | ICD-10-CM | POA: Diagnosis not present

## 2022-01-15 DIAGNOSIS — S72041D Displaced fracture of base of neck of right femur, subsequent encounter for closed fracture with routine healing: Secondary | ICD-10-CM | POA: Diagnosis not present

## 2022-01-15 DIAGNOSIS — E039 Hypothyroidism, unspecified: Secondary | ICD-10-CM | POA: Diagnosis not present

## 2022-01-15 DIAGNOSIS — Z87891 Personal history of nicotine dependence: Secondary | ICD-10-CM | POA: Diagnosis not present

## 2022-01-19 DIAGNOSIS — F419 Anxiety disorder, unspecified: Secondary | ICD-10-CM | POA: Diagnosis not present

## 2022-01-19 DIAGNOSIS — I110 Hypertensive heart disease with heart failure: Secondary | ICD-10-CM | POA: Diagnosis not present

## 2022-01-19 DIAGNOSIS — S72041D Displaced fracture of base of neck of right femur, subsequent encounter for closed fracture with routine healing: Secondary | ICD-10-CM | POA: Diagnosis not present

## 2022-01-19 DIAGNOSIS — I5030 Unspecified diastolic (congestive) heart failure: Secondary | ICD-10-CM | POA: Diagnosis not present

## 2022-01-19 DIAGNOSIS — J449 Chronic obstructive pulmonary disease, unspecified: Secondary | ICD-10-CM | POA: Diagnosis not present

## 2022-01-19 DIAGNOSIS — J9611 Chronic respiratory failure with hypoxia: Secondary | ICD-10-CM | POA: Diagnosis not present

## 2022-01-20 DIAGNOSIS — F419 Anxiety disorder, unspecified: Secondary | ICD-10-CM | POA: Diagnosis not present

## 2022-01-20 DIAGNOSIS — I5030 Unspecified diastolic (congestive) heart failure: Secondary | ICD-10-CM | POA: Diagnosis not present

## 2022-01-20 DIAGNOSIS — I110 Hypertensive heart disease with heart failure: Secondary | ICD-10-CM | POA: Diagnosis not present

## 2022-01-20 DIAGNOSIS — J9611 Chronic respiratory failure with hypoxia: Secondary | ICD-10-CM | POA: Diagnosis not present

## 2022-01-20 DIAGNOSIS — J449 Chronic obstructive pulmonary disease, unspecified: Secondary | ICD-10-CM | POA: Diagnosis not present

## 2022-01-20 DIAGNOSIS — S72041D Displaced fracture of base of neck of right femur, subsequent encounter for closed fracture with routine healing: Secondary | ICD-10-CM | POA: Diagnosis not present

## 2022-01-22 DIAGNOSIS — I5030 Unspecified diastolic (congestive) heart failure: Secondary | ICD-10-CM | POA: Diagnosis not present

## 2022-01-22 DIAGNOSIS — S72041D Displaced fracture of base of neck of right femur, subsequent encounter for closed fracture with routine healing: Secondary | ICD-10-CM | POA: Diagnosis not present

## 2022-01-22 DIAGNOSIS — J449 Chronic obstructive pulmonary disease, unspecified: Secondary | ICD-10-CM | POA: Diagnosis not present

## 2022-01-22 DIAGNOSIS — I110 Hypertensive heart disease with heart failure: Secondary | ICD-10-CM | POA: Diagnosis not present

## 2022-01-22 DIAGNOSIS — F419 Anxiety disorder, unspecified: Secondary | ICD-10-CM | POA: Diagnosis not present

## 2022-01-22 DIAGNOSIS — J9611 Chronic respiratory failure with hypoxia: Secondary | ICD-10-CM | POA: Diagnosis not present

## 2022-01-23 ENCOUNTER — Ambulatory Visit (HOSPITAL_COMMUNITY)
Admission: RE | Admit: 2022-01-23 | Discharge: 2022-01-23 | Disposition: A | Discharge status: Home / Self Care | Payer: Medicare Other | Source: Ambulatory Visit | Attending: Family Medicine | Admitting: Family Medicine

## 2022-01-23 ENCOUNTER — Other Ambulatory Visit (HOSPITAL_COMMUNITY): Payer: Self-pay | Admitting: Family Medicine

## 2022-01-23 DIAGNOSIS — M25551 Pain in right hip: Secondary | ICD-10-CM | POA: Diagnosis not present

## 2022-01-23 DIAGNOSIS — J45909 Unspecified asthma, uncomplicated: Secondary | ICD-10-CM | POA: Diagnosis not present

## 2022-01-23 DIAGNOSIS — R030 Elevated blood-pressure reading, without diagnosis of hypertension: Secondary | ICD-10-CM | POA: Diagnosis not present

## 2022-01-23 DIAGNOSIS — J449 Chronic obstructive pulmonary disease, unspecified: Secondary | ICD-10-CM | POA: Diagnosis not present

## 2022-01-23 DIAGNOSIS — Z8781 Personal history of (healed) traumatic fracture: Secondary | ICD-10-CM | POA: Diagnosis not present

## 2022-01-23 DIAGNOSIS — M25552 Pain in left hip: Secondary | ICD-10-CM | POA: Diagnosis not present

## 2022-01-23 DIAGNOSIS — R636 Underweight: Secondary | ICD-10-CM | POA: Diagnosis not present

## 2022-01-23 DIAGNOSIS — R54 Age-related physical debility: Secondary | ICD-10-CM | POA: Diagnosis not present

## 2022-01-27 DIAGNOSIS — I5030 Unspecified diastolic (congestive) heart failure: Secondary | ICD-10-CM | POA: Diagnosis not present

## 2022-01-27 DIAGNOSIS — S72041D Displaced fracture of base of neck of right femur, subsequent encounter for closed fracture with routine healing: Secondary | ICD-10-CM | POA: Diagnosis not present

## 2022-01-27 DIAGNOSIS — F419 Anxiety disorder, unspecified: Secondary | ICD-10-CM | POA: Diagnosis not present

## 2022-01-27 DIAGNOSIS — J449 Chronic obstructive pulmonary disease, unspecified: Secondary | ICD-10-CM | POA: Diagnosis not present

## 2022-01-27 DIAGNOSIS — I110 Hypertensive heart disease with heart failure: Secondary | ICD-10-CM | POA: Diagnosis not present

## 2022-01-27 DIAGNOSIS — J9611 Chronic respiratory failure with hypoxia: Secondary | ICD-10-CM | POA: Diagnosis not present

## 2022-01-30 DIAGNOSIS — I5030 Unspecified diastolic (congestive) heart failure: Secondary | ICD-10-CM | POA: Diagnosis not present

## 2022-01-30 DIAGNOSIS — J9611 Chronic respiratory failure with hypoxia: Secondary | ICD-10-CM | POA: Diagnosis not present

## 2022-01-30 DIAGNOSIS — F419 Anxiety disorder, unspecified: Secondary | ICD-10-CM | POA: Diagnosis not present

## 2022-01-30 DIAGNOSIS — I110 Hypertensive heart disease with heart failure: Secondary | ICD-10-CM | POA: Diagnosis not present

## 2022-01-30 DIAGNOSIS — J449 Chronic obstructive pulmonary disease, unspecified: Secondary | ICD-10-CM | POA: Diagnosis not present

## 2022-01-30 DIAGNOSIS — S72041D Displaced fracture of base of neck of right femur, subsequent encounter for closed fracture with routine healing: Secondary | ICD-10-CM | POA: Diagnosis not present

## 2022-02-03 DIAGNOSIS — S72041D Displaced fracture of base of neck of right femur, subsequent encounter for closed fracture with routine healing: Secondary | ICD-10-CM | POA: Diagnosis not present

## 2022-02-03 DIAGNOSIS — F419 Anxiety disorder, unspecified: Secondary | ICD-10-CM | POA: Diagnosis not present

## 2022-02-03 DIAGNOSIS — I110 Hypertensive heart disease with heart failure: Secondary | ICD-10-CM | POA: Diagnosis not present

## 2022-02-03 DIAGNOSIS — J9611 Chronic respiratory failure with hypoxia: Secondary | ICD-10-CM | POA: Diagnosis not present

## 2022-02-03 DIAGNOSIS — J449 Chronic obstructive pulmonary disease, unspecified: Secondary | ICD-10-CM | POA: Diagnosis not present

## 2022-02-03 DIAGNOSIS — I5030 Unspecified diastolic (congestive) heart failure: Secondary | ICD-10-CM | POA: Diagnosis not present

## 2022-02-06 DIAGNOSIS — I5030 Unspecified diastolic (congestive) heart failure: Secondary | ICD-10-CM | POA: Diagnosis not present

## 2022-02-06 DIAGNOSIS — F419 Anxiety disorder, unspecified: Secondary | ICD-10-CM | POA: Diagnosis not present

## 2022-02-06 DIAGNOSIS — S72041D Displaced fracture of base of neck of right femur, subsequent encounter for closed fracture with routine healing: Secondary | ICD-10-CM | POA: Diagnosis not present

## 2022-02-06 DIAGNOSIS — J449 Chronic obstructive pulmonary disease, unspecified: Secondary | ICD-10-CM | POA: Diagnosis not present

## 2022-02-06 DIAGNOSIS — I110 Hypertensive heart disease with heart failure: Secondary | ICD-10-CM | POA: Diagnosis not present

## 2022-02-06 DIAGNOSIS — J9611 Chronic respiratory failure with hypoxia: Secondary | ICD-10-CM | POA: Diagnosis not present

## 2022-02-09 DIAGNOSIS — S72041D Displaced fracture of base of neck of right femur, subsequent encounter for closed fracture with routine healing: Secondary | ICD-10-CM | POA: Diagnosis not present

## 2022-02-09 DIAGNOSIS — I5030 Unspecified diastolic (congestive) heart failure: Secondary | ICD-10-CM | POA: Diagnosis not present

## 2022-02-09 DIAGNOSIS — J9611 Chronic respiratory failure with hypoxia: Secondary | ICD-10-CM | POA: Diagnosis not present

## 2022-02-09 DIAGNOSIS — F419 Anxiety disorder, unspecified: Secondary | ICD-10-CM | POA: Diagnosis not present

## 2022-02-09 DIAGNOSIS — J449 Chronic obstructive pulmonary disease, unspecified: Secondary | ICD-10-CM | POA: Diagnosis not present

## 2022-02-09 DIAGNOSIS — I110 Hypertensive heart disease with heart failure: Secondary | ICD-10-CM | POA: Diagnosis not present

## 2022-02-10 DIAGNOSIS — J449 Chronic obstructive pulmonary disease, unspecified: Secondary | ICD-10-CM | POA: Diagnosis not present

## 2022-02-10 DIAGNOSIS — I5030 Unspecified diastolic (congestive) heart failure: Secondary | ICD-10-CM | POA: Diagnosis not present

## 2022-02-10 DIAGNOSIS — S72041D Displaced fracture of base of neck of right femur, subsequent encounter for closed fracture with routine healing: Secondary | ICD-10-CM | POA: Diagnosis not present

## 2022-02-10 DIAGNOSIS — J9611 Chronic respiratory failure with hypoxia: Secondary | ICD-10-CM | POA: Diagnosis not present

## 2022-02-10 DIAGNOSIS — F419 Anxiety disorder, unspecified: Secondary | ICD-10-CM | POA: Diagnosis not present

## 2022-02-10 DIAGNOSIS — I110 Hypertensive heart disease with heart failure: Secondary | ICD-10-CM | POA: Diagnosis not present

## 2022-02-13 DIAGNOSIS — J449 Chronic obstructive pulmonary disease, unspecified: Secondary | ICD-10-CM | POA: Diagnosis not present

## 2022-02-13 DIAGNOSIS — S72041D Displaced fracture of base of neck of right femur, subsequent encounter for closed fracture with routine healing: Secondary | ICD-10-CM | POA: Diagnosis not present

## 2022-02-13 DIAGNOSIS — I110 Hypertensive heart disease with heart failure: Secondary | ICD-10-CM | POA: Diagnosis not present

## 2022-02-13 DIAGNOSIS — F419 Anxiety disorder, unspecified: Secondary | ICD-10-CM | POA: Diagnosis not present

## 2022-02-13 DIAGNOSIS — J9611 Chronic respiratory failure with hypoxia: Secondary | ICD-10-CM | POA: Diagnosis not present

## 2022-02-13 DIAGNOSIS — I5030 Unspecified diastolic (congestive) heart failure: Secondary | ICD-10-CM | POA: Diagnosis not present

## 2022-02-14 DIAGNOSIS — J9611 Chronic respiratory failure with hypoxia: Secondary | ICD-10-CM | POA: Diagnosis not present

## 2022-02-14 DIAGNOSIS — I5030 Unspecified diastolic (congestive) heart failure: Secondary | ICD-10-CM | POA: Diagnosis not present

## 2022-02-14 DIAGNOSIS — F02818 Dementia in other diseases classified elsewhere, unspecified severity, with other behavioral disturbance: Secondary | ICD-10-CM | POA: Diagnosis not present

## 2022-02-14 DIAGNOSIS — E46 Unspecified protein-calorie malnutrition: Secondary | ICD-10-CM | POA: Diagnosis not present

## 2022-02-14 DIAGNOSIS — S72041D Displaced fracture of base of neck of right femur, subsequent encounter for closed fracture with routine healing: Secondary | ICD-10-CM | POA: Diagnosis not present

## 2022-02-14 DIAGNOSIS — Z993 Dependence on wheelchair: Secondary | ICD-10-CM | POA: Diagnosis not present

## 2022-02-14 DIAGNOSIS — E039 Hypothyroidism, unspecified: Secondary | ICD-10-CM | POA: Diagnosis not present

## 2022-02-14 DIAGNOSIS — E785 Hyperlipidemia, unspecified: Secondary | ICD-10-CM | POA: Diagnosis not present

## 2022-02-14 DIAGNOSIS — K5900 Constipation, unspecified: Secondary | ICD-10-CM | POA: Diagnosis not present

## 2022-02-14 DIAGNOSIS — Z87440 Personal history of urinary (tract) infections: Secondary | ICD-10-CM | POA: Diagnosis not present

## 2022-02-14 DIAGNOSIS — Z87891 Personal history of nicotine dependence: Secondary | ICD-10-CM | POA: Diagnosis not present

## 2022-02-14 DIAGNOSIS — Z96641 Presence of right artificial hip joint: Secondary | ICD-10-CM | POA: Diagnosis not present

## 2022-02-14 DIAGNOSIS — K219 Gastro-esophageal reflux disease without esophagitis: Secondary | ICD-10-CM | POA: Diagnosis not present

## 2022-02-14 DIAGNOSIS — F419 Anxiety disorder, unspecified: Secondary | ICD-10-CM | POA: Diagnosis not present

## 2022-02-14 DIAGNOSIS — F32A Depression, unspecified: Secondary | ICD-10-CM | POA: Diagnosis not present

## 2022-02-14 DIAGNOSIS — Z9981 Dependence on supplemental oxygen: Secondary | ICD-10-CM | POA: Diagnosis not present

## 2022-02-14 DIAGNOSIS — J449 Chronic obstructive pulmonary disease, unspecified: Secondary | ICD-10-CM | POA: Diagnosis not present

## 2022-02-14 DIAGNOSIS — Z9181 History of falling: Secondary | ICD-10-CM | POA: Diagnosis not present

## 2022-02-14 DIAGNOSIS — I110 Hypertensive heart disease with heart failure: Secondary | ICD-10-CM | POA: Diagnosis not present

## 2022-02-17 DIAGNOSIS — I110 Hypertensive heart disease with heart failure: Secondary | ICD-10-CM | POA: Diagnosis not present

## 2022-02-17 DIAGNOSIS — J9611 Chronic respiratory failure with hypoxia: Secondary | ICD-10-CM | POA: Diagnosis not present

## 2022-02-17 DIAGNOSIS — I5030 Unspecified diastolic (congestive) heart failure: Secondary | ICD-10-CM | POA: Diagnosis not present

## 2022-02-17 DIAGNOSIS — F419 Anxiety disorder, unspecified: Secondary | ICD-10-CM | POA: Diagnosis not present

## 2022-02-17 DIAGNOSIS — J449 Chronic obstructive pulmonary disease, unspecified: Secondary | ICD-10-CM | POA: Diagnosis not present

## 2022-02-17 DIAGNOSIS — S72041D Displaced fracture of base of neck of right femur, subsequent encounter for closed fracture with routine healing: Secondary | ICD-10-CM | POA: Diagnosis not present

## 2022-02-19 DIAGNOSIS — J449 Chronic obstructive pulmonary disease, unspecified: Secondary | ICD-10-CM | POA: Diagnosis not present

## 2022-02-19 DIAGNOSIS — J9611 Chronic respiratory failure with hypoxia: Secondary | ICD-10-CM | POA: Diagnosis not present

## 2022-02-19 DIAGNOSIS — I5030 Unspecified diastolic (congestive) heart failure: Secondary | ICD-10-CM | POA: Diagnosis not present

## 2022-02-19 DIAGNOSIS — S72041D Displaced fracture of base of neck of right femur, subsequent encounter for closed fracture with routine healing: Secondary | ICD-10-CM | POA: Diagnosis not present

## 2022-02-19 DIAGNOSIS — F419 Anxiety disorder, unspecified: Secondary | ICD-10-CM | POA: Diagnosis not present

## 2022-02-19 DIAGNOSIS — I110 Hypertensive heart disease with heart failure: Secondary | ICD-10-CM | POA: Diagnosis not present

## 2022-02-20 DIAGNOSIS — I110 Hypertensive heart disease with heart failure: Secondary | ICD-10-CM | POA: Diagnosis not present

## 2022-02-20 DIAGNOSIS — S72041D Displaced fracture of base of neck of right femur, subsequent encounter for closed fracture with routine healing: Secondary | ICD-10-CM | POA: Diagnosis not present

## 2022-02-20 DIAGNOSIS — J449 Chronic obstructive pulmonary disease, unspecified: Secondary | ICD-10-CM | POA: Diagnosis not present

## 2022-02-20 DIAGNOSIS — F419 Anxiety disorder, unspecified: Secondary | ICD-10-CM | POA: Diagnosis not present

## 2022-02-20 DIAGNOSIS — J9611 Chronic respiratory failure with hypoxia: Secondary | ICD-10-CM | POA: Diagnosis not present

## 2022-02-20 DIAGNOSIS — I5030 Unspecified diastolic (congestive) heart failure: Secondary | ICD-10-CM | POA: Diagnosis not present

## 2022-02-23 DIAGNOSIS — J449 Chronic obstructive pulmonary disease, unspecified: Secondary | ICD-10-CM | POA: Diagnosis not present

## 2022-02-23 DIAGNOSIS — J9611 Chronic respiratory failure with hypoxia: Secondary | ICD-10-CM | POA: Diagnosis not present

## 2022-02-23 DIAGNOSIS — F419 Anxiety disorder, unspecified: Secondary | ICD-10-CM | POA: Diagnosis not present

## 2022-02-23 DIAGNOSIS — I110 Hypertensive heart disease with heart failure: Secondary | ICD-10-CM | POA: Diagnosis not present

## 2022-02-23 DIAGNOSIS — I5030 Unspecified diastolic (congestive) heart failure: Secondary | ICD-10-CM | POA: Diagnosis not present

## 2022-02-23 DIAGNOSIS — S72041D Displaced fracture of base of neck of right femur, subsequent encounter for closed fracture with routine healing: Secondary | ICD-10-CM | POA: Diagnosis not present

## 2022-03-03 DIAGNOSIS — F419 Anxiety disorder, unspecified: Secondary | ICD-10-CM | POA: Diagnosis not present

## 2022-03-03 DIAGNOSIS — S72041D Displaced fracture of base of neck of right femur, subsequent encounter for closed fracture with routine healing: Secondary | ICD-10-CM | POA: Diagnosis not present

## 2022-03-03 DIAGNOSIS — J9611 Chronic respiratory failure with hypoxia: Secondary | ICD-10-CM | POA: Diagnosis not present

## 2022-03-03 DIAGNOSIS — I5030 Unspecified diastolic (congestive) heart failure: Secondary | ICD-10-CM | POA: Diagnosis not present

## 2022-03-03 DIAGNOSIS — J449 Chronic obstructive pulmonary disease, unspecified: Secondary | ICD-10-CM | POA: Diagnosis not present

## 2022-03-03 DIAGNOSIS — I110 Hypertensive heart disease with heart failure: Secondary | ICD-10-CM | POA: Diagnosis not present

## 2022-03-06 DIAGNOSIS — J449 Chronic obstructive pulmonary disease, unspecified: Secondary | ICD-10-CM | POA: Diagnosis not present

## 2022-03-06 DIAGNOSIS — I5030 Unspecified diastolic (congestive) heart failure: Secondary | ICD-10-CM | POA: Diagnosis not present

## 2022-03-06 DIAGNOSIS — F419 Anxiety disorder, unspecified: Secondary | ICD-10-CM | POA: Diagnosis not present

## 2022-03-06 DIAGNOSIS — S72041D Displaced fracture of base of neck of right femur, subsequent encounter for closed fracture with routine healing: Secondary | ICD-10-CM | POA: Diagnosis not present

## 2022-03-06 DIAGNOSIS — I110 Hypertensive heart disease with heart failure: Secondary | ICD-10-CM | POA: Diagnosis not present

## 2022-03-06 DIAGNOSIS — J9611 Chronic respiratory failure with hypoxia: Secondary | ICD-10-CM | POA: Diagnosis not present

## 2022-03-13 DIAGNOSIS — I5030 Unspecified diastolic (congestive) heart failure: Secondary | ICD-10-CM | POA: Diagnosis not present

## 2022-03-13 DIAGNOSIS — I110 Hypertensive heart disease with heart failure: Secondary | ICD-10-CM | POA: Diagnosis not present

## 2022-03-13 DIAGNOSIS — F419 Anxiety disorder, unspecified: Secondary | ICD-10-CM | POA: Diagnosis not present

## 2022-03-13 DIAGNOSIS — J449 Chronic obstructive pulmonary disease, unspecified: Secondary | ICD-10-CM | POA: Diagnosis not present

## 2022-03-13 DIAGNOSIS — S72041D Displaced fracture of base of neck of right femur, subsequent encounter for closed fracture with routine healing: Secondary | ICD-10-CM | POA: Diagnosis not present

## 2022-03-13 DIAGNOSIS — J9611 Chronic respiratory failure with hypoxia: Secondary | ICD-10-CM | POA: Diagnosis not present

## 2022-03-16 DIAGNOSIS — J9611 Chronic respiratory failure with hypoxia: Secondary | ICD-10-CM | POA: Diagnosis not present

## 2022-03-16 DIAGNOSIS — Z9981 Dependence on supplemental oxygen: Secondary | ICD-10-CM | POA: Diagnosis not present

## 2022-03-16 DIAGNOSIS — F32A Depression, unspecified: Secondary | ICD-10-CM | POA: Diagnosis not present

## 2022-03-16 DIAGNOSIS — E039 Hypothyroidism, unspecified: Secondary | ICD-10-CM | POA: Diagnosis not present

## 2022-03-16 DIAGNOSIS — E785 Hyperlipidemia, unspecified: Secondary | ICD-10-CM | POA: Diagnosis not present

## 2022-03-16 DIAGNOSIS — K5900 Constipation, unspecified: Secondary | ICD-10-CM | POA: Diagnosis not present

## 2022-03-16 DIAGNOSIS — S72041D Displaced fracture of base of neck of right femur, subsequent encounter for closed fracture with routine healing: Secondary | ICD-10-CM | POA: Diagnosis not present

## 2022-03-16 DIAGNOSIS — Z87440 Personal history of urinary (tract) infections: Secondary | ICD-10-CM | POA: Diagnosis not present

## 2022-03-16 DIAGNOSIS — I5030 Unspecified diastolic (congestive) heart failure: Secondary | ICD-10-CM | POA: Diagnosis not present

## 2022-03-16 DIAGNOSIS — F02818 Dementia in other diseases classified elsewhere, unspecified severity, with other behavioral disturbance: Secondary | ICD-10-CM | POA: Diagnosis not present

## 2022-03-16 DIAGNOSIS — Z96641 Presence of right artificial hip joint: Secondary | ICD-10-CM | POA: Diagnosis not present

## 2022-03-16 DIAGNOSIS — Z9181 History of falling: Secondary | ICD-10-CM | POA: Diagnosis not present

## 2022-03-16 DIAGNOSIS — I110 Hypertensive heart disease with heart failure: Secondary | ICD-10-CM | POA: Diagnosis not present

## 2022-03-16 DIAGNOSIS — K219 Gastro-esophageal reflux disease without esophagitis: Secondary | ICD-10-CM | POA: Diagnosis not present

## 2022-03-16 DIAGNOSIS — E46 Unspecified protein-calorie malnutrition: Secondary | ICD-10-CM | POA: Diagnosis not present

## 2022-03-16 DIAGNOSIS — F419 Anxiety disorder, unspecified: Secondary | ICD-10-CM | POA: Diagnosis not present

## 2022-03-16 DIAGNOSIS — J449 Chronic obstructive pulmonary disease, unspecified: Secondary | ICD-10-CM | POA: Diagnosis not present

## 2022-03-16 DIAGNOSIS — Z87891 Personal history of nicotine dependence: Secondary | ICD-10-CM | POA: Diagnosis not present

## 2022-03-17 DIAGNOSIS — F02818 Dementia in other diseases classified elsewhere, unspecified severity, with other behavioral disturbance: Secondary | ICD-10-CM | POA: Diagnosis not present

## 2022-03-17 DIAGNOSIS — J9611 Chronic respiratory failure with hypoxia: Secondary | ICD-10-CM | POA: Diagnosis not present

## 2022-03-17 DIAGNOSIS — I110 Hypertensive heart disease with heart failure: Secondary | ICD-10-CM | POA: Diagnosis not present

## 2022-03-17 DIAGNOSIS — E039 Hypothyroidism, unspecified: Secondary | ICD-10-CM | POA: Diagnosis not present

## 2022-03-17 DIAGNOSIS — J449 Chronic obstructive pulmonary disease, unspecified: Secondary | ICD-10-CM | POA: Diagnosis not present

## 2022-03-17 DIAGNOSIS — I5030 Unspecified diastolic (congestive) heart failure: Secondary | ICD-10-CM | POA: Diagnosis not present

## 2022-03-17 DIAGNOSIS — F419 Anxiety disorder, unspecified: Secondary | ICD-10-CM | POA: Diagnosis not present

## 2022-03-17 DIAGNOSIS — S72041D Displaced fracture of base of neck of right femur, subsequent encounter for closed fracture with routine healing: Secondary | ICD-10-CM | POA: Diagnosis not present

## 2022-03-17 DIAGNOSIS — F32A Depression, unspecified: Secondary | ICD-10-CM | POA: Diagnosis not present

## 2022-03-17 DIAGNOSIS — E46 Unspecified protein-calorie malnutrition: Secondary | ICD-10-CM | POA: Diagnosis not present

## 2022-03-24 DIAGNOSIS — I110 Hypertensive heart disease with heart failure: Secondary | ICD-10-CM | POA: Diagnosis not present

## 2022-03-24 DIAGNOSIS — J449 Chronic obstructive pulmonary disease, unspecified: Secondary | ICD-10-CM | POA: Diagnosis not present

## 2022-03-24 DIAGNOSIS — S72041D Displaced fracture of base of neck of right femur, subsequent encounter for closed fracture with routine healing: Secondary | ICD-10-CM | POA: Diagnosis not present

## 2022-03-24 DIAGNOSIS — I5030 Unspecified diastolic (congestive) heart failure: Secondary | ICD-10-CM | POA: Diagnosis not present

## 2022-03-24 DIAGNOSIS — F419 Anxiety disorder, unspecified: Secondary | ICD-10-CM | POA: Diagnosis not present

## 2022-03-24 DIAGNOSIS — J9611 Chronic respiratory failure with hypoxia: Secondary | ICD-10-CM | POA: Diagnosis not present

## 2022-04-02 ENCOUNTER — Other Ambulatory Visit: Payer: Self-pay

## 2022-04-02 ENCOUNTER — Emergency Department (HOSPITAL_COMMUNITY): Payer: Medicare Other

## 2022-04-02 ENCOUNTER — Encounter (HOSPITAL_COMMUNITY): Payer: Self-pay | Admitting: Emergency Medicine

## 2022-04-02 ENCOUNTER — Emergency Department (HOSPITAL_COMMUNITY)
Admission: EM | Admit: 2022-04-02 | Discharge: 2022-04-03 | Disposition: A | Discharge status: Home / Self Care | Payer: Medicare Other | Attending: Emergency Medicine | Admitting: Emergency Medicine

## 2022-04-02 DIAGNOSIS — R410 Disorientation, unspecified: Secondary | ICD-10-CM | POA: Insufficient documentation

## 2022-04-02 DIAGNOSIS — Z7982 Long term (current) use of aspirin: Secondary | ICD-10-CM | POA: Diagnosis not present

## 2022-04-02 DIAGNOSIS — J439 Emphysema, unspecified: Secondary | ICD-10-CM

## 2022-04-02 DIAGNOSIS — R29818 Other symptoms and signs involving the nervous system: Secondary | ICD-10-CM | POA: Diagnosis not present

## 2022-04-02 DIAGNOSIS — I10 Essential (primary) hypertension: Secondary | ICD-10-CM | POA: Diagnosis not present

## 2022-04-02 DIAGNOSIS — Z79899 Other long term (current) drug therapy: Secondary | ICD-10-CM | POA: Diagnosis not present

## 2022-04-02 DIAGNOSIS — R0602 Shortness of breath: Secondary | ICD-10-CM | POA: Diagnosis not present

## 2022-04-02 DIAGNOSIS — J449 Chronic obstructive pulmonary disease, unspecified: Secondary | ICD-10-CM | POA: Insufficient documentation

## 2022-04-02 DIAGNOSIS — R4182 Altered mental status, unspecified: Secondary | ICD-10-CM | POA: Diagnosis not present

## 2022-04-02 LAB — CBC WITH DIFFERENTIAL/PLATELET
Abs Immature Granulocytes: 0.02 10*3/uL (ref 0.00–0.07)
Basophils Absolute: 0 10*3/uL (ref 0.0–0.1)
Basophils Relative: 0 %
Eosinophils Absolute: 0.1 10*3/uL (ref 0.0–0.5)
Eosinophils Relative: 1 %
HCT: 41.2 % (ref 36.0–46.0)
Hemoglobin: 11.1 g/dL — ABNORMAL LOW (ref 12.0–15.0)
Immature Granulocytes: 0 %
Lymphocytes Relative: 15 %
Lymphs Abs: 1.1 10*3/uL (ref 0.7–4.0)
MCH: 24.5 pg — ABNORMAL LOW (ref 26.0–34.0)
MCHC: 26.9 g/dL — ABNORMAL LOW (ref 30.0–36.0)
MCV: 90.9 fL (ref 80.0–100.0)
Monocytes Absolute: 0.9 10*3/uL (ref 0.1–1.0)
Monocytes Relative: 13 %
Neutro Abs: 4.9 10*3/uL (ref 1.7–7.7)
Neutrophils Relative %: 71 %
Platelets: 256 10*3/uL (ref 150–400)
RBC: 4.53 MIL/uL (ref 3.87–5.11)
RDW: 15.3 % (ref 11.5–15.5)
WBC: 7.1 10*3/uL (ref 4.0–10.5)
nRBC: 0 % (ref 0.0–0.2)

## 2022-04-02 LAB — BASIC METABOLIC PANEL
Anion gap: 10 (ref 5–15)
BUN: 17 mg/dL (ref 8–23)
CO2: 43 mmol/L — ABNORMAL HIGH (ref 22–32)
Calcium: 9.6 mg/dL (ref 8.9–10.3)
Chloride: 91 mmol/L — ABNORMAL LOW (ref 98–111)
Creatinine, Ser: 0.65 mg/dL (ref 0.44–1.00)
GFR, Estimated: >60 mL/min (ref >60)
Glucose, Bld: 104 mg/dL — ABNORMAL HIGH (ref 70–99)
Potassium: 3.6 mmol/L (ref 3.5–5.1)
Sodium: 144 mmol/L (ref 135–145)

## 2022-04-02 MED ORDER — SODIUM CHLORIDE 0.9 % IV BOLUS: 1000.0000 mL | Freq: Once | INTRAVENOUS | Status: DC

## 2022-04-02 MED ORDER — HYDRALAZINE HCL 25 MG PO TABS: 25.0000 mg | ORAL_TABLET | Freq: Once | ORAL | Status: DC

## 2022-04-02 MED ORDER — LACTATED RINGERS IV BOLUS
1000.0000 mL | Freq: Once | INTRAVENOUS | Status: AC
  Administered 2022-04-02: 1000 mL via INTRAVENOUS

## 2022-04-02 NOTE — ED Triage Notes (Signed)
Pt BIB [facility] for r/o UTI, per pt she isn't haven't any urinary symptoms, but her son thinks she has a UTI. Pt A&O in triage.

## 2022-04-02 NOTE — ED Provider Notes (Signed)
Patient out to me at shift change from [facility], patient presenting with increased confusion from her baseline, with prior similar symptoms patient has had a UTI and son who is her primary caregiver at bedside is concerned that she may have a UTI and need treatment for this.  Pending UA at this time.  12:02 AM Urine just now collected and pending.  Pt awake, pleasant, no distress.  Discussed with Dr. Christy Gentles who will dispo pt once UA resulted.  Reasonable to dispo to home in sons care +/- abx pending urine results.   Evalee Jefferson, PA-C 04/03/22 0003    Ripley Fraise, MD 04/03/22 [phone]

## 2022-04-02 NOTE — ED Provider Notes (Signed)
[facility] Provider Note   CSN: [id number] Arrival date & time: 04/02/22  1240     History No chief complaint on file.   [name] is a 81 y.o. female with a past medical history of COPD, hypertension, bilateral hip fractures and depression presenting today with her son with concern for UTI.  Son reports that he takes care of her at home with his father and that she has been more confused.  Says that some days she is just "somewhere else."  Reports this is been going on for 4 days.  Her confusion is intermittent and she has good days and bad days.  Is on 3 L of oxygen for COPD at baseline.  They have noted a strong odor to her urine.  Patient denies any pain, urinary discomfort but does tell me that she is feeling short of breath.  HPI     Home Medications Prior to Admission medications   Medication Sig Start Date End Date Taking? Authorizing Provider  acetaminophen (TYLENOL) 325 MG tablet Take 2 tablets (650 mg total) by mouth every 6 (six) hours as needed for mild pain (or Fever >/= 101). 02/28/18   Roxan Hockey, MD  albuterol (PROVENTIL) (2.5 MG/3ML) 0.083% nebulizer solution Take 3 mLs (2.5 mg total) by nebulization every 6 (six) hours as needed for wheezing or shortness of breath. 06/09/20   Oswald Hillock, MD  ALPRAZolam Duanne Moron) 0.25 MG tablet Take 0.25 mg by mouth 3 (three) times daily as needed. 08/13/21   [provider]  aspirin EC 325 MG EC tablet Take 1 tablet (325 mg total) by mouth daily with breakfast. 10/04/21   Phillips Grout, MD  buPROPion (WELLBUTRIN XL) 150 MG 24 hr tablet Take 150 mg by mouth daily.    [provider]  DILT-XR 120 MG 24 hr capsule Take 120 mg by mouth every morning. 08/11/21   [provider]  escitalopram (LEXAPRO) 5 MG tablet Take 5 mg by mouth daily.    [provider]  levothyroxine (SYNTHROID, LEVOTHROID) 50 MCG tablet Take 50 mcg by mouth daily before breakfast.    [provider]  mirtazapine (REMERON) 15 MG tablet Take 7.5-15 mg by mouth at bedtime as needed. 09/10/21   [provider]  pantoprazole (PROTONIX) 40 MG tablet Take 1 tablet (40 mg total) by mouth daily before breakfast. 02/28/18   Denton Brick, Courage, MD  polyethylene glycol (MIRALAX / GLYCOLAX) packet Take 17 g by mouth 2 (two) times daily. 02/28/18   Roxan Hockey, MD  tiotropium (SPIRIVA) 18 MCG inhalation capsule Place 1 capsule (18 mcg total) into inhaler and inhale daily. Patient taking differently: Place 18 mcg into inhaler and inhale daily as needed (for shortness of breath). 02/26/17 09/30/21  Javier Glazier, MD  VENTOLIN HFA 108 [phone] Base) MCG/ACT inhaler SMARTSIG:1-2 Puff(s) Via Inhaler Every 4-6 Hours 09/04/21   [provider]      Allergies    Bee venom    Review of Systems   Review of Systems  Physical Exam Updated Vital Signs BP (!) 171/87   Pulse 70   Temp (!) 97.5 F (36.4 C) (Oral)   Resp (!) 29   Ht 5' (1.524 m)   Wt 45.4 kg   SpO2 100%   BMI 19.53 kg/m  Physical Exam Vitals and nursing note reviewed.  Constitutional:      Appearance: Normal appearance.  HENT:     Head: Normocephalic and atraumatic.  Mouth/Throat:     Mouth: Mucous membranes are dry.     Pharynx: Oropharynx is clear.  Eyes:     General: No scleral icterus.    Conjunctiva/sclera: Conjunctivae normal.  Cardiovascular:     Rate and Rhythm: Normal rate and regular rhythm.     Pulses: Normal pulses.  Pulmonary:     Effort: Pulmonary effort is normal. No respiratory distress.     Breath sounds: No wheezing or rales.  Abdominal:     General: Abdomen is flat.     Palpations: Abdomen is soft.     Tenderness: There is no abdominal tenderness.  Skin:    Coloration: Skin is pale.     Findings: No rash.  Neurological:     Mental Status: She is alert.  Psychiatric:        Mood and Affect: Mood normal.     ED Results / Procedures / Treatments   Labs (all labs  ordered are listed, but only abnormal results are displayed) Labs Reviewed  CBC WITH DIFFERENTIAL/PLATELET - Abnormal; Notable for the following components:      Result Value   Hemoglobin 11.1 (*)    MCH 24.5 (*)    MCHC 26.9 (*)    All other components within normal limits  BASIC METABOLIC PANEL - Abnormal; Notable for the following components:   Chloride 91 (*)    CO2 43 (*)    Glucose, Bld 104 (*)    All other components within normal limits  URINE CULTURE  URINALYSIS, ROUTINE W REFLEX MICROSCOPIC    EKG None  Radiology CT Head Wo Contrast  Result Date: 04/02/2022 CLINICAL DATA:  A female at age 66 presenting with neuro deficit and altered mental status. EXAM: CT HEAD WITHOUT CONTRAST TECHNIQUE: Contiguous axial images were obtained from the base of the skull through the vertex without intravenous contrast. RADIATION DOSE REDUCTION: This exam was performed according to the departmental dose-optimization program which includes automated exposure control, adjustment of the mA and/or kV according to patient size and/or use of iterative reconstruction technique. COMPARISON:  December 14, 2021 FINDINGS: Brain: Encephalomalacia in LEFT posterior parietal lobe and signs of cerebral atrophy and ventricular prominence without change over time. Signs of chronic microvascular ischemic change as before. No signs of intracranial hemorrhage, mass, mass effect or midline shift. Vascular: No hyperdense vessel or unexpected calcification. Skull: Normal. Negative for fracture or focal lesion. Sinuses/Orbits: Visualized paranasal sinuses and orbits are unremarkable. Other: None IMPRESSION: 1. No acute intracranial abnormality. 2. Signs of atrophy, chronic microvascular ischemic change and encephalomalacia similar to prior imaging. Electronically Signed   By: Zetta Bills M.D.   On: 04/02/2022 16:49   DG Chest Portable 1 View  Result Date: 04/02/2022 CLINICAL DATA:  Shortness of breath. EXAM: PORTABLE CHEST  1 VIEW COMPARISON:  09/30/2021 FINDINGS: Heart size is stable. Patient is rotated to the left. Pulmonary emphysema is again noted. No evidence of acute infiltrate or pleural effusion. Aortic atherosclerotic calcification incidentally noted. IMPRESSION: Emphysema. No acute findings. Electronically Signed   By: Marlaine Hind M.D.   On: 04/02/2022 16:37    Procedures Procedures   Medications Ordered in ED Medications  hydrALAZINE (APRESOLINE) tablet 25 mg (has no administration in time range)  lactated ringers bolus 1,000 mL (1,000 mLs Intravenous New Bag/Given 04/02/22 1612)    ED Course/ Medical Decision Making/ A&P Clinical Course as of 04/02/22 1907  Thu Apr 02, 2022  1837 Spoke with charge nurse about the lack of a urine  sample after nearly 6 hours.  Patient presenting for UTI, will continue to request urine sample [MR]    Clinical Course User Index [MR] Lennie Dunnigan, Cecilio Asper, PA-C                           Medical Decision Making Amount and/or Complexity of Data Reviewed Labs: ordered. Radiology: ordered.  Risk Prescription drug management.   This patient presents to the ED for concern of intermittent confusion.  Differential includes but is not limited to CVA, dementia, electrolyte abnormality, pneumonia, UTI   This is not an exhaustive differential.    Past Medical History / Co-morbidities / Social History: Patient has history of COPD and weight loss.  Also has record of dementia and 2021   Additional history: Per chart review, patient has a history of weakness, weight loss and dementia.  Many of these are present today.  May be chronic   Physical Exam: Pertinent physical exam findings include -Chronically ill-appearing.  Arousable and oriented to self.  Unable to tell me who her son is at bedside.  Also unable to state the year or the month.  Lab Tests: I ordered, and personally interpreted labs.  The pertinent results include:  -CO2 43, around patient's  baseline -Chloride 91   Imaging Studies: I ordered and independently visualized and interpreted head CT which showed atrophy however no ischemia or hemorrhage. I agree with the radiologist interpretation.   Medications: I ordered medication including fluids list and hydralazine for hypertension.    Disposition: At this time patient's urine sample is pending.  This will need to be collected and resulted prior to discharge.  If it is positive I believe is reasonable to treat her due to increased confusion, odor and frequent urination per the son.  Otherwise, there does not appear to be an indication for admission and her lab work.  Elevated bicarb consistent with with her baseline secondary to COPD.  Signed out to Evalee Jefferson, PA-C at this time.   I discussed this case with my attending physician Dr. Pearline Cables who cosigned this note including patient's presenting symptoms, physical exam, and planned diagnostics and interventions. Attending physician stated agreement with plan or made changes to plan which were implemented.      Final Clinical Impression(s) / ED Diagnoses Final diagnoses:  None    Rx / DC Orders Signed out to [employer], PA-C.    Rhae Hammock, PA-C 40/98/11 [id number]    Gray, [place] P, DO 82/95/62 2100

## 2022-04-02 NOTE — ED Provider Triage Note (Signed)
Emergency Medicine Provider Triage Evaluation Note  [name] , a 81 y.o. female  was evaluated in triage.  Patient presenting for evaluation of possible urinary tract infection.  Apparently her son has suspicion that she may have a UTI which she has had frequently in the past.  She is here for testing for this possibility.  During initial medical screening exam, son is not present so it is unclear of the symptoms she is having that makes him suspect she has a UTI, patient denies dysuria, fever or any other complaints at this time.  She does have some dementia at baseline however.  Review of Systems  Positive: Negative symptoms per patient report Negative: Negative  Physical Exam  BP (!) 165/81   Pulse 65   Temp (!) 97.5 F (36.4 C) (Oral)   Resp 20   Ht 5' (1.524 m)   Wt 45.4 kg   BMI 19.53 kg/m  Gen:   Awake, no distress   Resp:  Normal effort  MSK:   Moves extremities without difficulty  Other:  No abdominal pain.  Medical Decision Making  Medically screening exam initiated at 2:45 PM.  Appropriate orders placed.  [name] was informed that the remainder of the evaluation will be completed by another provider, this initial triage assessment does not replace that evaluation, and the importance of remaining in the ED until their evaluation is complete.  Basic labs including a urinalysis has been ordered.   Penny Jefferson, PA-C 04/02/22 1447

## 2022-04-02 NOTE — Discharge Instructions (Addendum)

## 2022-04-03 LAB — URINALYSIS, ROUTINE W REFLEX MICROSCOPIC
Bilirubin Urine: NEGATIVE
Glucose, UA: NEGATIVE mg/dL
Hgb urine dipstick: NEGATIVE
Ketones, ur: NEGATIVE mg/dL
Leukocytes,Ua: NEGATIVE
Nitrite: NEGATIVE
Protein, ur: 30 mg/dL — AB
Specific Gravity, Urine: 1.012 (ref 1.005–1.030)
pH: 6 (ref 5.0–8.0)

## 2022-04-03 NOTE — ED Notes (Signed)
This RN attempted to contact patients son and husband to arrange a ride for her for d/c, was unable to contact them. Will continue to call.

## 2022-04-04 LAB — URINE CULTURE: Culture: NO GROWTH

## 2022-04-15 DIAGNOSIS — Z9981 Dependence on supplemental oxygen: Secondary | ICD-10-CM | POA: Diagnosis not present

## 2022-04-15 DIAGNOSIS — J9611 Chronic respiratory failure with hypoxia: Secondary | ICD-10-CM | POA: Diagnosis not present

## 2022-04-15 DIAGNOSIS — F02818 Dementia in other diseases classified elsewhere, unspecified severity, with other behavioral disturbance: Secondary | ICD-10-CM | POA: Diagnosis not present

## 2022-04-15 DIAGNOSIS — E785 Hyperlipidemia, unspecified: Secondary | ICD-10-CM | POA: Diagnosis not present

## 2022-04-15 DIAGNOSIS — I5030 Unspecified diastolic (congestive) heart failure: Secondary | ICD-10-CM | POA: Diagnosis not present

## 2022-04-15 DIAGNOSIS — S72041D Displaced fracture of base of neck of right femur, subsequent encounter for closed fracture with routine healing: Secondary | ICD-10-CM | POA: Diagnosis not present

## 2022-04-15 DIAGNOSIS — F32A Depression, unspecified: Secondary | ICD-10-CM | POA: Diagnosis not present

## 2022-04-15 DIAGNOSIS — J449 Chronic obstructive pulmonary disease, unspecified: Secondary | ICD-10-CM | POA: Diagnosis not present

## 2022-04-15 DIAGNOSIS — Z87440 Personal history of urinary (tract) infections: Secondary | ICD-10-CM | POA: Diagnosis not present

## 2022-04-15 DIAGNOSIS — I110 Hypertensive heart disease with heart failure: Secondary | ICD-10-CM | POA: Diagnosis not present

## 2022-04-15 DIAGNOSIS — K5900 Constipation, unspecified: Secondary | ICD-10-CM | POA: Diagnosis not present

## 2022-04-15 DIAGNOSIS — F419 Anxiety disorder, unspecified: Secondary | ICD-10-CM | POA: Diagnosis not present

## 2022-04-15 DIAGNOSIS — Z87891 Personal history of nicotine dependence: Secondary | ICD-10-CM | POA: Diagnosis not present

## 2022-04-15 DIAGNOSIS — K219 Gastro-esophageal reflux disease without esophagitis: Secondary | ICD-10-CM | POA: Diagnosis not present

## 2022-04-15 DIAGNOSIS — E46 Unspecified protein-calorie malnutrition: Secondary | ICD-10-CM | POA: Diagnosis not present

## 2022-04-15 DIAGNOSIS — Z96641 Presence of right artificial hip joint: Secondary | ICD-10-CM | POA: Diagnosis not present

## 2022-04-15 DIAGNOSIS — Z9181 History of falling: Secondary | ICD-10-CM | POA: Diagnosis not present

## 2022-04-15 DIAGNOSIS — E039 Hypothyroidism, unspecified: Secondary | ICD-10-CM | POA: Diagnosis not present

## 2022-04-29 DIAGNOSIS — J441 Chronic obstructive pulmonary disease with (acute) exacerbation: Secondary | ICD-10-CM | POA: Diagnosis not present

## 2022-04-29 DIAGNOSIS — I10 Essential (primary) hypertension: Secondary | ICD-10-CM | POA: Diagnosis not present

## 2022-04-29 DIAGNOSIS — I214 Non-ST elevation (NSTEMI) myocardial infarction: Secondary | ICD-10-CM | POA: Diagnosis not present

## 2022-04-29 DIAGNOSIS — J9601 Acute respiratory failure with hypoxia: Secondary | ICD-10-CM | POA: Diagnosis not present

## 2022-04-29 DIAGNOSIS — F419 Anxiety disorder, unspecified: Secondary | ICD-10-CM | POA: Diagnosis not present

## 2022-04-29 DIAGNOSIS — R7989 Other specified abnormal findings of blood chemistry: Secondary | ICD-10-CM | POA: Diagnosis not present

## 2022-04-29 DIAGNOSIS — E785 Hyperlipidemia, unspecified: Secondary | ICD-10-CM | POA: Diagnosis not present

## 2022-04-29 DIAGNOSIS — Z720 Tobacco use: Secondary | ICD-10-CM | POA: Diagnosis not present

## 2022-04-29 DIAGNOSIS — I252 Old myocardial infarction: Secondary | ICD-10-CM | POA: Diagnosis not present

## 2022-04-29 DIAGNOSIS — I21A1 Myocardial infarction type 2: Secondary | ICD-10-CM | POA: Diagnosis present

## 2022-04-29 DIAGNOSIS — I5023 Acute on chronic systolic (congestive) heart failure: Secondary | ICD-10-CM | POA: Diagnosis present

## 2022-04-29 DIAGNOSIS — I272 Pulmonary hypertension, unspecified: Secondary | ICD-10-CM | POA: Diagnosis present

## 2022-04-29 DIAGNOSIS — I2510 Atherosclerotic heart disease of native coronary artery without angina pectoris: Secondary | ICD-10-CM | POA: Diagnosis present

## 2022-04-29 DIAGNOSIS — R0902 Hypoxemia: Secondary | ICD-10-CM | POA: Diagnosis not present

## 2022-04-29 DIAGNOSIS — Z79890 Hormone replacement therapy (postmenopausal): Secondary | ICD-10-CM | POA: Diagnosis not present

## 2022-04-29 DIAGNOSIS — J449 Chronic obstructive pulmonary disease, unspecified: Secondary | ICD-10-CM | POA: Diagnosis not present

## 2022-04-29 DIAGNOSIS — Z66 Do not resuscitate: Secondary | ICD-10-CM | POA: Diagnosis not present

## 2022-04-29 DIAGNOSIS — E782 Mixed hyperlipidemia: Secondary | ICD-10-CM | POA: Diagnosis present

## 2022-04-29 DIAGNOSIS — Z79899 Other long term (current) drug therapy: Secondary | ICD-10-CM | POA: Diagnosis not present

## 2022-04-29 DIAGNOSIS — I469 Cardiac arrest, cause unspecified: Secondary | ICD-10-CM | POA: Diagnosis not present

## 2022-04-29 DIAGNOSIS — J80 Acute respiratory distress syndrome: Secondary | ICD-10-CM | POA: Diagnosis not present

## 2022-04-29 DIAGNOSIS — J9600 Acute respiratory failure, unspecified whether with hypoxia or hypercapnia: Secondary | ICD-10-CM | POA: Diagnosis not present

## 2022-04-29 DIAGNOSIS — E43 Unspecified severe protein-calorie malnutrition: Secondary | ICD-10-CM | POA: Diagnosis present

## 2022-04-29 DIAGNOSIS — R0600 Dyspnea, unspecified: Secondary | ICD-10-CM | POA: Diagnosis not present

## 2022-04-29 DIAGNOSIS — J439 Emphysema, unspecified: Secondary | ICD-10-CM | POA: Diagnosis present

## 2022-04-29 DIAGNOSIS — R404 Transient alteration of awareness: Secondary | ICD-10-CM | POA: Diagnosis not present

## 2022-04-29 DIAGNOSIS — Z20822 Contact with and (suspected) exposure to covid-19: Secondary | ICD-10-CM | POA: Diagnosis not present

## 2022-04-29 DIAGNOSIS — R778 Other specified abnormalities of plasma proteins: Secondary | ICD-10-CM | POA: Diagnosis not present

## 2022-04-29 DIAGNOSIS — E039 Hypothyroidism, unspecified: Secondary | ICD-10-CM | POA: Diagnosis not present

## 2022-04-29 DIAGNOSIS — Z9989 Dependence on other enabling machines and devices: Secondary | ICD-10-CM | POA: Diagnosis not present

## 2022-04-29 DIAGNOSIS — J9622 Acute and chronic respiratory failure with hypercapnia: Secondary | ICD-10-CM | POA: Diagnosis not present

## 2022-04-29 DIAGNOSIS — I700 Atherosclerosis of aorta: Secondary | ICD-10-CM | POA: Diagnosis present

## 2022-04-29 DIAGNOSIS — I509 Heart failure, unspecified: Secondary | ICD-10-CM | POA: Diagnosis not present

## 2022-04-29 DIAGNOSIS — Z681 Body mass index (BMI) 19 or less, adult: Secondary | ICD-10-CM | POA: Diagnosis not present

## 2022-04-29 DIAGNOSIS — K219 Gastro-esophageal reflux disease without esophagitis: Secondary | ICD-10-CM | POA: Diagnosis present

## 2022-04-29 DIAGNOSIS — Z515 Encounter for palliative care: Secondary | ICD-10-CM | POA: Diagnosis not present

## 2022-04-29 DIAGNOSIS — E8770 Fluid overload, unspecified: Secondary | ICD-10-CM | POA: Diagnosis not present

## 2022-04-29 DIAGNOSIS — Z792 Long term (current) use of antibiotics: Secondary | ICD-10-CM | POA: Diagnosis not present

## 2022-04-29 DIAGNOSIS — R55 Syncope and collapse: Secondary | ICD-10-CM | POA: Diagnosis not present

## 2022-04-29 DIAGNOSIS — E870 Hyperosmolality and hypernatremia: Secondary | ICD-10-CM | POA: Diagnosis present

## 2022-04-29 DIAGNOSIS — R54 Age-related physical debility: Secondary | ICD-10-CM | POA: Diagnosis present

## 2022-04-29 DIAGNOSIS — Z9981 Dependence on supplemental oxygen: Secondary | ICD-10-CM | POA: Diagnosis not present

## 2022-04-29 DIAGNOSIS — J9621 Acute and chronic respiratory failure with hypoxia: Secondary | ICD-10-CM | POA: Diagnosis present

## 2022-04-30 DIAGNOSIS — E039 Hypothyroidism, unspecified: Secondary | ICD-10-CM | POA: Diagnosis present

## 2022-04-30 DIAGNOSIS — Z9981 Dependence on supplemental oxygen: Secondary | ICD-10-CM | POA: Diagnosis not present

## 2022-04-30 DIAGNOSIS — I214 Non-ST elevation (NSTEMI) myocardial infarction: Secondary | ICD-10-CM | POA: Diagnosis not present

## 2022-04-30 DIAGNOSIS — I2510 Atherosclerotic heart disease of native coronary artery without angina pectoris: Secondary | ICD-10-CM | POA: Diagnosis present

## 2022-04-30 DIAGNOSIS — I21A1 Myocardial infarction type 2: Secondary | ICD-10-CM | POA: Diagnosis present

## 2022-04-30 DIAGNOSIS — R0902 Hypoxemia: Secondary | ICD-10-CM | POA: Diagnosis not present

## 2022-04-30 DIAGNOSIS — K219 Gastro-esophageal reflux disease without esophagitis: Secondary | ICD-10-CM | POA: Diagnosis present

## 2022-04-30 DIAGNOSIS — R7989 Other specified abnormal findings of blood chemistry: Secondary | ICD-10-CM | POA: Diagnosis not present

## 2022-04-30 DIAGNOSIS — I252 Old myocardial infarction: Secondary | ICD-10-CM | POA: Diagnosis not present

## 2022-04-30 DIAGNOSIS — R778 Other specified abnormalities of plasma proteins: Secondary | ICD-10-CM | POA: Diagnosis not present

## 2022-04-30 DIAGNOSIS — E782 Mixed hyperlipidemia: Secondary | ICD-10-CM | POA: Diagnosis present

## 2022-04-30 DIAGNOSIS — J9621 Acute and chronic respiratory failure with hypoxia: Secondary | ICD-10-CM | POA: Diagnosis present

## 2022-04-30 DIAGNOSIS — E43 Unspecified severe protein-calorie malnutrition: Secondary | ICD-10-CM | POA: Diagnosis present

## 2022-04-30 DIAGNOSIS — I5023 Acute on chronic systolic (congestive) heart failure: Secondary | ICD-10-CM | POA: Diagnosis present

## 2022-04-30 DIAGNOSIS — Z79899 Other long term (current) drug therapy: Secondary | ICD-10-CM | POA: Diagnosis not present

## 2022-04-30 DIAGNOSIS — J9622 Acute and chronic respiratory failure with hypercapnia: Secondary | ICD-10-CM | POA: Diagnosis present

## 2022-04-30 DIAGNOSIS — Z681 Body mass index (BMI) 19 or less, adult: Secondary | ICD-10-CM | POA: Diagnosis not present

## 2022-04-30 DIAGNOSIS — Z9989 Dependence on other enabling machines and devices: Secondary | ICD-10-CM | POA: Diagnosis not present

## 2022-04-30 DIAGNOSIS — J441 Chronic obstructive pulmonary disease with (acute) exacerbation: Secondary | ICD-10-CM | POA: Diagnosis not present

## 2022-04-30 DIAGNOSIS — Z792 Long term (current) use of antibiotics: Secondary | ICD-10-CM | POA: Diagnosis not present

## 2022-04-30 DIAGNOSIS — R54 Age-related physical debility: Secondary | ICD-10-CM | POA: Diagnosis present

## 2022-04-30 DIAGNOSIS — I10 Essential (primary) hypertension: Secondary | ICD-10-CM | POA: Diagnosis not present

## 2022-04-30 DIAGNOSIS — Z515 Encounter for palliative care: Secondary | ICD-10-CM | POA: Diagnosis not present

## 2022-04-30 DIAGNOSIS — E8770 Fluid overload, unspecified: Secondary | ICD-10-CM | POA: Diagnosis not present

## 2022-04-30 DIAGNOSIS — F419 Anxiety disorder, unspecified: Secondary | ICD-10-CM | POA: Diagnosis present

## 2022-04-30 DIAGNOSIS — Z66 Do not resuscitate: Secondary | ICD-10-CM | POA: Diagnosis not present

## 2022-04-30 DIAGNOSIS — J439 Emphysema, unspecified: Secondary | ICD-10-CM | POA: Diagnosis present

## 2022-04-30 DIAGNOSIS — I700 Atherosclerosis of aorta: Secondary | ICD-10-CM | POA: Diagnosis present

## 2022-04-30 DIAGNOSIS — I509 Heart failure, unspecified: Secondary | ICD-10-CM | POA: Diagnosis not present

## 2022-04-30 DIAGNOSIS — Z79890 Hormone replacement therapy (postmenopausal): Secondary | ICD-10-CM | POA: Diagnosis not present

## 2022-04-30 DIAGNOSIS — I272 Pulmonary hypertension, unspecified: Secondary | ICD-10-CM | POA: Diagnosis present

## 2022-04-30 DIAGNOSIS — E785 Hyperlipidemia, unspecified: Secondary | ICD-10-CM | POA: Diagnosis not present

## 2022-04-30 DIAGNOSIS — E870 Hyperosmolality and hypernatremia: Secondary | ICD-10-CM | POA: Diagnosis present

## 2022-11-05 ENCOUNTER — Encounter: Payer: Self-pay | Admitting: Radiology
